# Patient Record
Sex: Female | Born: 1950
Health system: Southern US, Community
[De-identification: ages and names within clinical notes are randomized; demographics above are authoritative.]

## PROBLEM LIST (undated history)

## (undated) DIAGNOSIS — F32A Depression, unspecified: Secondary | ICD-10-CM

## (undated) DIAGNOSIS — I10 Essential (primary) hypertension: Secondary | ICD-10-CM

## (undated) DIAGNOSIS — T8859XA Other complications of anesthesia, initial encounter: Secondary | ICD-10-CM

## (undated) DIAGNOSIS — J45909 Unspecified asthma, uncomplicated: Secondary | ICD-10-CM

## (undated) DIAGNOSIS — F329 Major depressive disorder, single episode, unspecified: Secondary | ICD-10-CM

## (undated) DIAGNOSIS — B002 Herpesviral gingivostomatitis and pharyngotonsillitis: Secondary | ICD-10-CM

## (undated) DIAGNOSIS — K589 Irritable bowel syndrome without diarrhea: Secondary | ICD-10-CM

## (undated) DIAGNOSIS — T7840XA Allergy, unspecified, initial encounter: Secondary | ICD-10-CM

## (undated) DIAGNOSIS — K219 Gastro-esophageal reflux disease without esophagitis: Secondary | ICD-10-CM

## (undated) DIAGNOSIS — E119 Type 2 diabetes mellitus without complications: Secondary | ICD-10-CM

## (undated) DIAGNOSIS — F419 Anxiety disorder, unspecified: Secondary | ICD-10-CM

## (undated) DIAGNOSIS — E785 Hyperlipidemia, unspecified: Secondary | ICD-10-CM

## (undated) DIAGNOSIS — H269 Unspecified cataract: Secondary | ICD-10-CM

## (undated) DIAGNOSIS — M1990 Unspecified osteoarthritis, unspecified site: Secondary | ICD-10-CM

## (undated) DIAGNOSIS — H409 Unspecified glaucoma: Secondary | ICD-10-CM

## (undated) DIAGNOSIS — M47812 Spondylosis without myelopathy or radiculopathy, cervical region: Secondary | ICD-10-CM

## (undated) HISTORY — DX: Gastro-esophageal reflux disease without esophagitis: K21.9

## (undated) HISTORY — DX: Spondylosis without myelopathy or radiculopathy, cervical region: M47.812

## (undated) HISTORY — DX: Herpesviral gingivostomatitis and pharyngotonsillitis: B00.2

## (undated) HISTORY — PX: ROTATOR CUFF REPAIR: SHX139

## (undated) HISTORY — DX: Major depressive disorder, single episode, unspecified: F32.9

## (undated) HISTORY — DX: Unspecified cataract: H26.9

## (undated) HISTORY — DX: Allergy, unspecified, initial encounter: T78.40XA

## (undated) HISTORY — PX: DEBRIDEMENT TENNIS ELBOW: SHX1442

## (undated) HISTORY — DX: Depression, unspecified: F32.A

## (undated) HISTORY — PX: EYE SURGERY: SHX253

## (undated) HISTORY — PX: UPPER GASTROINTESTINAL ENDOSCOPY: SHX188

## (undated) HISTORY — DX: Anxiety disorder, unspecified: F41.9

## (undated) HISTORY — PX: TONSILLECTOMY: SUR1361

## (undated) HISTORY — DX: Hyperlipidemia, unspecified: E78.5

## (undated) HISTORY — DX: Unspecified glaucoma: H40.9

## (undated) HISTORY — DX: Irritable bowel syndrome, unspecified: K58.9

## (undated) HISTORY — DX: Essential (primary) hypertension: I10

## (undated) HISTORY — PX: BREAST EXCISIONAL BIOPSY: SUR124

## (undated) HISTORY — DX: Unspecified osteoarthritis, unspecified site: M19.90

## (undated) HISTORY — PX: BREAST SURGERY: SHX581

---

## 1976-05-11 HISTORY — PX: ABDOMINAL HYSTERECTOMY: SHX81

## 1986-05-11 HISTORY — PX: APPENDECTOMY: SHX54

## 1997-10-15 ENCOUNTER — Ambulatory Visit (HOSPITAL_COMMUNITY): Admission: RE | Admit: 1997-10-15 | Discharge: 1997-10-15 | Payer: Self-pay | Admitting: Surgery

## 1999-04-30 ENCOUNTER — Encounter: Admission: RE | Admit: 1999-04-30 | Discharge: 1999-04-30 | Payer: Self-pay | Admitting: Family Medicine

## 1999-04-30 ENCOUNTER — Encounter: Payer: Self-pay | Admitting: Family Medicine

## 2000-06-21 ENCOUNTER — Encounter: Payer: Self-pay | Admitting: Family Medicine

## 2000-06-21 ENCOUNTER — Encounter: Admission: RE | Admit: 2000-06-21 | Discharge: 2000-06-21 | Payer: Self-pay | Admitting: Family Medicine

## 2000-12-12 ENCOUNTER — Encounter: Payer: Self-pay | Admitting: Family Medicine

## 2000-12-12 ENCOUNTER — Ambulatory Visit (HOSPITAL_COMMUNITY): Admission: RE | Admit: 2000-12-12 | Discharge: 2000-12-12 | Payer: Self-pay | Admitting: Family Medicine

## 2001-05-20 ENCOUNTER — Ambulatory Visit (HOSPITAL_BASED_OUTPATIENT_CLINIC_OR_DEPARTMENT_OTHER): Admission: RE | Admit: 2001-05-20 | Discharge: 2001-05-20 | Payer: Self-pay | Admitting: Orthopedic Surgery

## 2001-09-30 ENCOUNTER — Encounter: Payer: Self-pay | Admitting: Family Medicine

## 2001-09-30 ENCOUNTER — Encounter: Admission: RE | Admit: 2001-09-30 | Discharge: 2001-09-30 | Payer: Self-pay | Admitting: Family Medicine

## 2002-05-19 ENCOUNTER — Encounter (INDEPENDENT_AMBULATORY_CARE_PROVIDER_SITE_OTHER): Payer: Self-pay | Admitting: Specialist

## 2002-05-19 ENCOUNTER — Ambulatory Visit (HOSPITAL_COMMUNITY): Admission: RE | Admit: 2002-05-19 | Discharge: 2002-05-19 | Payer: Self-pay | Admitting: Gastroenterology

## 2002-10-27 ENCOUNTER — Other Ambulatory Visit: Admission: RE | Admit: 2002-10-27 | Discharge: 2002-10-27 | Payer: Self-pay | Admitting: Family Medicine

## 2003-08-09 ENCOUNTER — Encounter: Admission: RE | Admit: 2003-08-09 | Discharge: 2003-08-09 | Payer: Self-pay | Admitting: Family Medicine

## 2004-02-21 ENCOUNTER — Encounter: Admission: RE | Admit: 2004-02-21 | Discharge: 2004-02-21 | Payer: Self-pay | Admitting: Family Medicine

## 2005-02-09 ENCOUNTER — Other Ambulatory Visit: Admission: RE | Admit: 2005-02-09 | Discharge: 2005-02-09 | Payer: Self-pay | Admitting: Family Medicine

## 2005-08-20 ENCOUNTER — Encounter: Admission: RE | Admit: 2005-08-20 | Discharge: 2005-08-20 | Payer: Self-pay | Admitting: Family Medicine

## 2006-01-05 ENCOUNTER — Encounter: Admission: RE | Admit: 2006-01-05 | Discharge: 2006-01-05 | Payer: Self-pay | Admitting: Surgery

## 2006-01-18 ENCOUNTER — Ambulatory Visit: Payer: Self-pay | Admitting: General Surgery

## 2006-12-23 ENCOUNTER — Encounter: Admission: RE | Admit: 2006-12-23 | Discharge: 2006-12-23 | Payer: Self-pay | Admitting: Family Medicine

## 2006-12-29 ENCOUNTER — Encounter: Admission: RE | Admit: 2006-12-29 | Discharge: 2006-12-29 | Payer: Self-pay | Admitting: Family Medicine

## 2007-02-01 ENCOUNTER — Encounter (INDEPENDENT_AMBULATORY_CARE_PROVIDER_SITE_OTHER): Payer: Self-pay | Admitting: Diagnostic Radiology

## 2007-02-01 ENCOUNTER — Encounter: Admission: RE | Admit: 2007-02-01 | Discharge: 2007-02-01 | Payer: Self-pay | Admitting: Surgery

## 2007-05-12 HISTORY — PX: BREAST BIOPSY: SHX20

## 2008-03-15 ENCOUNTER — Other Ambulatory Visit: Admission: RE | Admit: 2008-03-15 | Discharge: 2008-03-15 | Payer: Self-pay | Admitting: Family Medicine

## 2008-06-19 LAB — HM COLONOSCOPY

## 2008-10-12 ENCOUNTER — Emergency Department: Payer: Self-pay | Admitting: Emergency Medicine

## 2009-05-11 HISTORY — PX: COLONOSCOPY: SHX174

## 2010-02-06 ENCOUNTER — Ambulatory Visit: Payer: Self-pay | Admitting: Family Medicine

## 2010-02-06 DIAGNOSIS — B09 Unspecified viral infection characterized by skin and mucous membrane lesions: Secondary | ICD-10-CM | POA: Insufficient documentation

## 2010-06-10 NOTE — Assessment & Plan Note (Signed)
Summary: place on forehead-?shingles/jbb   Vital Signs:  Patient Profile:   60 Years Old Female CC:      bump on face ? shingles Weight:      117 pounds Temp:     97.3 degrees F oral Pulse rate:   57 / minute Pulse rhythm:   regular Resp:     14 per minute BP sitting:   145 / 78  (left arm)  Pt. in pain?   no  Vitals Entered By: Tacey Ruiz MD (February 07, 2010 12:12 PM)                 History of Present Illness History from: patient Reason for visit: see chief complaint Chief Complaint: bump on face ? shingles History of Present Illness: 3 days ago noted erythematous rash on forehead. It bothered her only a small amount. She did mess with it. another came up later that day on her forehead that looked similar to the first, no pain or itching again. This morning however, she noted 3 small pimple-like lesions on her upper left forehead that was painful and then pruitic. She saw a pharmacist who referred her to see [place]. She also reports that she has numbness on the upper left forehead and left upper cheek. No history of shingles in the past. Does have a history of fever blister on the lip.  REVIEW OF SYSTEMS Constitutional Symptoms       Complains of fatigue.     Denies fever, chills, night sweats, weight loss, and weight gain.  Eyes       Complains of eye drainage and glasses.      Denies change in vision, eye pain, contact lenses, and eye surgery. Ear/Nose/Throat/Mouth       Complains of dizziness and sinus problems.      Denies hearing loss/aids, change in hearing, ear pain, ear discharge, frequent runny nose, frequent nose bleeds, sore throat, hoarseness, and tooth pain or bleeding.  Respiratory       Denies dry cough, productive cough, wheezing, shortness of breath, asthma, bronchitis, and emphysema/COPD.  Cardiovascular       Denies murmurs, chest pain, and tires easily with exhertion.    Gastrointestinal       Complains of indigestion.      Denies stomach pain,  nausea/vomiting, diarrhea, constipation, and blood in bowel movements. Genitourniary       Denies painful urination, kidney stones, and loss of urinary control. Neurological       Complains of headaches, numbness, and tingling.      Denies paralysis, seizures, and fainting/blackouts. Musculoskeletal       Complains of muscle pain, joint pain, joint stiffness, decreased range of motion, and muscle weakness.      Denies redness, swelling, and gout.  Skin       Denies bruising, unusual mles/lumps or sores, and hair/skin or nail changes.  Psych       Complains of anxiety/stress, depression, and sleep problems.      Denies mood changes, temper/anger issues, and speech problems.  Past History:  Past Medical History: Hyperlipidemia  Past Surgical History: Hysterectomy (partial by report) 1978 Rotator cuff repair - right and left Tonsillectomy - 1980 Breast biopsies ([id number])  Family History: Father: 69D : COPD Mother: 61D : lung cancer, htn, diabetes, hyperlipidemia Siblings:   Social History: Divorced Never Smoked Drug use-no Smoking Status:  never Drug Use:  no Physical Exam General appearance: well developed, well nourished, no acute distress Chest/Lungs:  no rales, wheezes, or rhonchi bilateral, breath sounds equal without effort Heart: regular rate and  rhythm, no murmur Skin: 2 erythematous macular lesions on upper left forehead - one crossing midline. 1 Pustular lesion, with surrounding erythema (white head) MSE: oriented to time, place, and person, somewhat anxious Assessment New Problems: UNSPECIFIED VIRAL EXANTHEM (ICD-057.9) HYPERLIPIDEMIA (ICD-272.4)   Plan New Medications/Changes: VALTREX 1 GM TABS (VALACYCLOVIR HCL) 1 by mouth three times a day x 7 days  #21 x 0, 02/06/2010, Tacey Ruiz MD  New Orders: New Patient Level III [phone]  The patient and/or caregiver has been counseled thoroughly with regard to medications prescribed including dosage,  schedule, interactions, rationale for use, and possible side effects and they verbalize understanding.  Diagnoses and expected course of recovery discussed and will return if not improved as expected or if the condition worsens. Patient and/or caregiver verbalized understanding.  Prescriptions: VALTREX 1 GM TABS (VALACYCLOVIR HCL) 1 by mouth three times a day x 7 days  #21 x 0   Entered and Authorized by:   Tacey Ruiz MD   Signed by:   Tacey Ruiz MD on 02/06/2010   Method used:   Electronically to        [facility]  [address] (retail)       [address], [address]       [place], [place]  [address]       Ph: [phone]       Fax: [phone]   RxID:   [id number]   Orders Added: 1)  New Patient Level III [25366]  Treating it as zoster as she does have numbness, but doubtful. She was given precautions in case and told to watch for any changes or worrisome developments. Urged not to touch lesions/face.   The patient was informed that there is no on-call provider or services available at this clinic during off-hours (when the clinic is closed).  If the patient developed a problem or concern that required immediate attention, the patient was advised to go the the nearest available urgent care or emergency department for medical care.  The patient verbalized understanding.     The risks, benefits and possible side effects of the treatments and tests were explained clearly to the patient and the patient verbalized understanding.

## 2010-09-26 NOTE — Op Note (Signed)
[facility]  Patient:    Maria Sweeney, Maria Sweeney Visit Number: [id number] MRN: [id number]          Service Type: DSU Location: [facility] Attending Physician:  Colbert Ewing Dictated by:   Loreta Ave, M.D. Proc. Date: 05/20/01 Admit Date:  05/20/2001 Discharge Date: 05/20/2001                             Operative Report  PREOPERATIVE DIAGNOSIS:   Impingement left shoulder with degenerative joint disease acromioclavicular joint.  POSTOPERATIVE DIAGNOSIS:  Impingement left shoulder with degenerative joint disease acromioclavicular joint.  OPERATION:  Left shoulder exam under anesthesia, arthroscopy, with debridement of superior rotator cuff, bursectomy, acromioplasty.  Excision of distal clavicle.  SURGEON:  Loreta Ave, M.D.  ASSISTANT:  Arlys John D. Petrarca, P.A.-C.  ANESTHESIA:  General.  ESTIMATED BLOOD LOSS:  Minimal.  SPECIMENS:  None.  CULTURES:  None.  COMPLICATIONS:  None.  DRESSINGS:  Soft compressive with sling.  DESCRIPTION OF PROCEDURE:  The patient was brought to the operating room and placed on the operating table in the supine position.  After adequate anesthesia had been obtained, the left shoulder was examined.  Full motion, good stability.  Placed in beach chair position on the shoulder positioner, prepped and draped in the usual sterile fashion.  Three standard arthroscopic portals, anterior, posterior and lateral.  Shoulder ended with a blunt obturator, distended and inspected.  The interior of the glenohumeral joint looked quite good with normal articular cartilage, labrum, ligaments, undersurface of rotator cuff, biceps tendon and biceps anchor.  Cannula redirected subacromially.  Typical impingement with abrasive change on the top of the cuff but no structural tearing.  Thick and inflamed bursae.  Cuff debrided.  Bursae resected.  Type 2 acromion.  Converted to a type 1 acromion with shaver and a high speed  bur releasing the CA ligament with hemostasis with cautery.  Distal clavicle exposed.  Marked periarticular spurring.  Grade 3 and 4 changes.  The lateral 1 cm of the clavicle sharply resected with shaver and high speed bur.  Decompression and clavicle excision viewed from all portals and found to be very adequate.  Instruments and fluid removed. Portals in shoulder and bursae injected with Marcaine.  Portals closed with 4-0 nylon.  Sterile compressive dressing applied.  Anesthesia reversed. Brought to the recovery room.  He tolerated the procedure well with no complications. Dictated by:   Loreta Ave, M.D. Attending Physician:  Colbert Ewing DD:  05/21/01 TD:  05/23/01 Job: [id number]

## 2010-09-26 NOTE — Op Note (Signed)
   Maria Sweeney, Maria Sweeney                          ACCOUNT NO.:  [id number]   MEDICAL RECORD NO.:  [id number]                   PATIENT TYPE:  AMB   LOCATION:  ENDO                                 FACILITY[facility]  [facility]   PHYSICIAN:  James L. Malon Kindle., M.D.          DATE OF BIRTH:  26-Sep-1950   DATE OF PROCEDURE:  05/19/2002  DATE OF DISCHARGE:                                 OPERATIVE REPORT   PROCEDURE:  Colonoscopy and coagulation of polyps.   MEDICATIONS:  Fentanyl 125 mcg, Versed 10 mg IV.   ENDOSCOPE:  Olympus pediatric colonoscope.   INDICATIONS:  Screening in a woman who is at somewhat increased risk for  colon cancer.   DESCRIPTION OF PROCEDURE:  The procedure had been explained to the patient  and consent obtained.  With the patient in the left lateral decubitus  position, the Olympus pediatric video colonoscope inserted and advanced  under direct visualization.  Prep was excellent.  She had a long, tortuous  colon and abdominal pressure and position changes were needed, but we were  able to reach the cecum without difficulty.  The ileocecal valve and  appendiceal orifice were seen.  The scope was withdrawn and the cecum and  ascending colon were seen well.  In the mid-ascending colon were two polyps  about 5 cm apart, each 3-4 mm in diameter, and both were cauterized with the  hot biopsy forceps and placed in a single jar.  The transverse and  descending colon were free of polyps.  In the sigmoid near the junction of  the descending colon approximately 30-35 cm from the anal verge, another 2-3  mm polyp was seen and was cauterized and placed in a separate jar.  No other  polyps were seen.  The scope was withdrawn.  The rectum was free of polyps.  The patient tolerated the procedure well.   ASSESSMENT:  Multiple colon polyps, cauterized.   PLAN:  Will check pathology, make further recommendations then.  Routine  postpolypectomy instructions.                       James L. Malon Kindle., M.D.    Waldron Session  D:  05/19/2002  T:  05/19/2002  Job:  [id number]   cc:   Chales Salmon. Abigail Miyamoto, M.D.  [address]  [place]  [place] [address]  Fax: [phone]

## 2011-06-08 ENCOUNTER — Other Ambulatory Visit: Payer: Self-pay | Admitting: Family Medicine

## 2011-06-08 ENCOUNTER — Ambulatory Visit (INDEPENDENT_AMBULATORY_CARE_PROVIDER_SITE_OTHER): Payer: No Typology Code available for payment source

## 2011-06-08 DIAGNOSIS — S81009A Unspecified open wound, unspecified knee, initial encounter: Secondary | ICD-10-CM

## 2011-06-08 DIAGNOSIS — B351 Tinea unguium: Secondary | ICD-10-CM

## 2011-06-08 DIAGNOSIS — R32 Unspecified urinary incontinence: Secondary | ICD-10-CM

## 2011-06-08 DIAGNOSIS — S91009A Unspecified open wound, unspecified ankle, initial encounter: Secondary | ICD-10-CM

## 2011-06-09 ENCOUNTER — Other Ambulatory Visit: Payer: Self-pay | Admitting: Family Medicine

## 2011-06-09 DIAGNOSIS — M5430 Sciatica, unspecified side: Secondary | ICD-10-CM

## 2011-06-09 LAB — COMPLETE METABOLIC PANEL WITH GFR
ALT: 15 U/L (ref 0–35)
AST: 16 U/L (ref 0–37)
Albumin: 4.7 g/dL (ref 3.5–5.2)
Alkaline Phosphatase: 80 U/L (ref 39–117)
BUN: 13 mg/dL (ref 6–23)
CO2: 29 mEq/L (ref 19–32)
Calcium: 9.9 mg/dL (ref 8.4–10.5)
Chloride: 105 mEq/L (ref 96–112)
Creat: 0.76 mg/dL (ref 0.50–1.10)
GFR, Est African American: >89 mL/min
GFR, Est Non African American: 85 mL/min
Glucose, Bld: 79 mg/dL (ref 70–99)
Potassium: 4.3 mEq/L (ref 3.5–5.3)
Sodium: 143 mEq/L (ref 135–145)
Total Bilirubin: 0.6 mg/dL (ref 0.3–1.2)
Total Protein: 6.8 g/dL (ref 6.0–8.3)

## 2011-06-09 LAB — LIPID PANEL
Cholesterol: 220 mg/dL — ABNORMAL HIGH (ref 0–200)
HDL: 55 mg/dL (ref >39)
LDL Cholesterol: 123 mg/dL — ABNORMAL HIGH (ref 0–99)
Total CHOL/HDL Ratio: 4 Ratio
Triglycerides: 210 mg/dL — ABNORMAL HIGH (ref <150)
VLDL: 42 mg/dL — ABNORMAL HIGH (ref 0–40)

## 2011-06-11 ENCOUNTER — Telehealth: Payer: Self-pay

## 2011-06-11 NOTE — Telephone Encounter (Signed)
Pt returned call. Explained Bx results to pt. Pt wanted to ask Dr. Milus Glazier if she needs to RTC to have the rest of the lesion removed. She reports there are no sutures from Bx, but please advise if the rest should be removed.

## 2011-06-20 ENCOUNTER — Other Ambulatory Visit: Payer: Self-pay

## 2011-12-11 ENCOUNTER — Other Ambulatory Visit: Payer: Self-pay | Admitting: Family Medicine

## 2011-12-24 ENCOUNTER — Ambulatory Visit (INDEPENDENT_AMBULATORY_CARE_PROVIDER_SITE_OTHER): Payer: No Typology Code available for payment source | Admitting: Family Medicine

## 2011-12-24 VITALS — BP 140/70 | HR 81 | Temp 98.0°F | Resp 16 | Ht 60.5 in | Wt 118.8 lb

## 2011-12-24 DIAGNOSIS — F341 Dysthymic disorder: Secondary | ICD-10-CM

## 2011-12-24 DIAGNOSIS — F418 Other specified anxiety disorders: Secondary | ICD-10-CM

## 2011-12-24 MED ORDER — CITALOPRAM HYDROBROMIDE 20 MG PO TABS: 20.0000 mg | ORAL_TABLET | Freq: Every day | ORAL | Status: DC

## 2011-12-24 MED ORDER — CLONAZEPAM 0.5 MG PO TABS: 0.5000 mg | ORAL_TABLET | Freq: Two times a day (BID) | ORAL | Status: DC | PRN

## 2011-12-24 NOTE — Progress Notes (Signed)
  Subjective:    Patient ID: Maria Sweeney, female    DOB: 1951/01/09, 61 y.o.   MRN: [id number]  HPI Maria Sweeney is a 61 y.o. female Hx of anxiety.  Takes celexa 20mg  qd - prior on Effexor, but on celexa for years. No new side effects. Stress with finances - 2 part time jobs. Hx of delayed grief reaction from mom's death.   Feels depressed at times - from not being able to work, challenges with multiple.  walks at work only.   Klonopin 0.5mg  BID prn - usually takes this  #60 with 5 refills of clonazepam rx 06/08/11. Takes one per day usually, but often at nights to calm down. Notes more anxiety. Decreased motivation - anhedonia currently - motivation and cost.  No SI.   tried to wean off celexa few months ago -  Felt ok for two weeks, then had to restart due to depression sx's.  No alcohol.   Review of Systems  Constitutional: Positive for fatigue. Negative for activity change.  Psychiatric/Behavioral: Positive for disturbed wake/sleep cycle. Negative for suicidal ideas.       Anhedonia.       Objective:   Physical Exam  Constitutional: She appears well-developed and well-nourished. No distress.  HENT:  Head: Normocephalic and atraumatic.  Cardiovascular: Normal rate, regular rhythm, normal heart sounds and intact distal pulses.   Pulmonary/Chest: Effort normal and breath sounds normal.  Skin: Skin is warm and dry.  Psychiatric: She has a normal mood and affect. Her speech is normal and behavior is normal. Judgment normal. Cognition and memory are normal. She expresses no suicidal ideation.       Assessment & Plan:  Maria Sweeney is a 61 y.o. female 1. Depression with anxiety  clonazePAM (KLONOPIN) 0.5 MG tablet, citalopram (CELEXA) 20 MG tablet   Anhedonia, decreased motivation with frustrations in finding a job and finances.  Discussed exercise role.  Discussed increase of Celexa, but risk of QT prolongation as taking omeprazole.- kept on same dose. Cont klonpin prn - and  goal of decreasing frequent use of this as able.  Recheck in next 6 months, sooner prn.  Greater than 75% of office visit with counseling - .   Patient Instructions  Continue at same dose of celexa at this point as possible risk of cardiac arrythmia at higher doses when taking omeprazole.  Work on Medical illustrator and exercise to help with motivation symptoms.  Can continue same doses of klonopin as needed.  Recheck in the next 6 months. Return to the clinic or go to the nearest emergency room if any of your symptoms worsen or new symptoms occur.

## 2011-12-24 NOTE — Patient Instructions (Signed)
Continue at same dose of celexa at this point as possible risk of cardiac arrythmia at higher doses when taking omeprazole.  Work on Medical illustrator and exercise to help with motivation symptoms.  Can continue same doses of klonopin as needed.  Recheck in the next 6 months. Return to the clinic or go to the nearest emergency room if any of your symptoms worsen or new symptoms occur.

## 2011-12-28 ENCOUNTER — Telehealth: Payer: Self-pay

## 2011-12-28 NOTE — Telephone Encounter (Signed)
Pt states she was rx'd clonazapam 0.5mg  qty60. States her rx is usually for 1mg  tablets qty 60. States when she saw dr for refill she couldn't remember dosage so she was rx'd the 0.5mg .  Please call pt.  Best: [phone]  bf

## 2011-12-28 NOTE — Telephone Encounter (Signed)
See OV, doesn't look like medicine has been filled yet at pharmacy, can we change to 1mg ?

## 2011-12-29 NOTE — Telephone Encounter (Signed)
I have left message for her to call me back so I can advise.

## 2011-12-29 NOTE — Telephone Encounter (Signed)
Sure we can change it to one 1 mg, but this will be without refills and she will have to bring the old Rx back to exchange. Her other option is to just keep the 0.5 mg tabs take two of them as directed and continue on. It's up to her.

## 2012-01-02 NOTE — Telephone Encounter (Signed)
She didn't realize the RX was not 1 mg until she received it. The pills looked different to her and she saw the mg was 0.5. She states she takes 1 mg bid as needed and usually gets #60. Can we sent in her another RX, she will keep her RX but will run out faster. Please advise

## 2012-01-03 MED ORDER — CLONAZEPAM 1 MG PO TABS: 1.0000 mg | ORAL_TABLET | Freq: Two times a day (BID) | ORAL | Status: DC | PRN

## 2012-01-03 NOTE — Telephone Encounter (Signed)
rx printed

## 2012-01-03 NOTE — Telephone Encounter (Signed)
Called pt to let her know RX will be sent in. Mailbox is full, unable to leave message

## 2012-01-04 NOTE — Telephone Encounter (Signed)
Have tried multiple times for her unable to contact., if she has further needs she will hopefully advise.

## 2012-01-16 ENCOUNTER — Other Ambulatory Visit: Payer: Self-pay | Admitting: Family Medicine

## 2012-01-23 ENCOUNTER — Other Ambulatory Visit: Payer: Self-pay

## 2012-01-23 DIAGNOSIS — F418 Other specified anxiety disorders: Secondary | ICD-10-CM

## 2012-03-14 ENCOUNTER — Other Ambulatory Visit: Payer: Self-pay | Admitting: Family Medicine

## 2012-04-13 ENCOUNTER — Telehealth: Payer: Self-pay

## 2012-04-13 NOTE — Telephone Encounter (Signed)
Pt is requesting 1.0 clonophan [phone]

## 2012-04-14 ENCOUNTER — Other Ambulatory Visit: Payer: Self-pay | Admitting: Family Medicine

## 2012-04-14 ENCOUNTER — Telehealth: Payer: Self-pay

## 2012-04-14 DIAGNOSIS — F419 Anxiety disorder, unspecified: Secondary | ICD-10-CM

## 2012-04-14 MED ORDER — CLONAZEPAM 1 MG PO TABS: 1.0000 mg | ORAL_TABLET | Freq: Two times a day (BID) | ORAL | Status: DC | PRN

## 2012-04-14 NOTE — Telephone Encounter (Signed)
lmom that rx was sent into pharmacy 

## 2012-04-14 NOTE — Telephone Encounter (Signed)
See the prior phone notes in August.  She was actually taking to 1.0 mg at that office visit and mistakingly thought they were 0.5mg .  Ok to refill Klonopin 1.0mg  BID prn, # 60, with 1 refill. Office visit needed in February 2014.

## 2012-04-14 NOTE — Telephone Encounter (Signed)
Your note from office visit indicates patient is to take klonopin at current dose, 0.5 patient requests this be increased to 1mg  tablet, please advise.

## 2012-04-14 NOTE — Telephone Encounter (Signed)
Patient called again requesting her 1.0 mg clonazePAM (KLONOPIN) 1 MG tablet Wants to void the .5 mg script and wants the 1.0 mg 60 tabs per month. Takes 1 to 2 tabs per day. She is completely out of medication  [phone] (H)

## 2012-05-16 ENCOUNTER — Other Ambulatory Visit: Payer: Self-pay | Admitting: Family Medicine

## 2012-05-16 NOTE — Telephone Encounter (Signed)
Due for OV Feb 2014

## 2012-06-16 ENCOUNTER — Telehealth: Payer: Self-pay | Admitting: *Deleted

## 2012-06-16 MED ORDER — OMEPRAZOLE 40 MG PO CPDR: 40.0000 mg | DELAYED_RELEASE_CAPSULE | Freq: Two times a day (BID) | ORAL | Status: DC

## 2012-06-16 NOTE — Telephone Encounter (Signed)
[facility] requesting refill on omeprazole 40mg .  Last fill 05/16/12

## 2012-06-16 NOTE — Telephone Encounter (Signed)
Done

## 2012-07-21 ENCOUNTER — Telehealth: Payer: Self-pay

## 2012-07-21 NOTE — Telephone Encounter (Signed)
Left message what meds does she need.

## 2012-07-21 NOTE — Telephone Encounter (Signed)
Pt would like to know if we could refill her medications for one week until she can get in next week to be seen.  [phone]

## 2012-07-22 ENCOUNTER — Telehealth: Payer: Self-pay

## 2012-07-22 DIAGNOSIS — F418 Other specified anxiety disorders: Secondary | ICD-10-CM

## 2012-07-22 MED ORDER — OMEPRAZOLE 40 MG PO CPDR: 40.0000 mg | DELAYED_RELEASE_CAPSULE | Freq: Every day | ORAL | Status: DC

## 2012-07-22 MED ORDER — CLONAZEPAM 1 MG PO TABS: 1.0000 mg | ORAL_TABLET | Freq: Two times a day (BID) | ORAL | Status: DC | PRN

## 2012-07-22 MED ORDER — CITALOPRAM HYDROBROMIDE 20 MG PO TABS: 20.0000 mg | ORAL_TABLET | Freq: Every day | ORAL | Status: DC

## 2012-07-22 NOTE — Telephone Encounter (Signed)
Patient due for follow up ? Okay to give 1 mo supply. Pended.

## 2012-07-22 NOTE — Telephone Encounter (Signed)
v

## 2012-07-22 NOTE — Telephone Encounter (Signed)
Called in Clonazepam. Patient advised she is due for follow up

## 2012-07-22 NOTE — Telephone Encounter (Signed)
Pt staets she spoke with amy recently about rx's she needs refilled and was told to call back with info about what she needed. Pt states she needs refills on her clonazapam 1mg , citalopram 20mg  and ameprozol  Best: [phone]  Pt states she will be in Monday or Tuesday for ov.  bf

## 2012-07-22 NOTE — Telephone Encounter (Signed)
I will give her 1 month supply of her Celexa and omeprazole and some Klonopin but she should keep her planned F/u with Dr Neva Seat.  Please let her know about our controlled substance policy due to her Klonopin.    Klonopin at [facility].

## 2012-07-25 ENCOUNTER — Encounter: Payer: Self-pay | Admitting: Family Medicine

## 2012-07-25 ENCOUNTER — Ambulatory Visit (INDEPENDENT_AMBULATORY_CARE_PROVIDER_SITE_OTHER): Payer: No Typology Code available for payment source | Admitting: Family Medicine

## 2012-07-25 VITALS — BP 158/90 | HR 90 | Temp 98.2°F | Resp 18 | Ht 60.05 in | Wt 126.8 lb

## 2012-07-25 DIAGNOSIS — F418 Other specified anxiety disorders: Secondary | ICD-10-CM

## 2012-07-25 DIAGNOSIS — F329 Major depressive disorder, single episode, unspecified: Secondary | ICD-10-CM

## 2012-07-25 DIAGNOSIS — F419 Anxiety disorder, unspecified: Secondary | ICD-10-CM | POA: Insufficient documentation

## 2012-07-25 DIAGNOSIS — K219 Gastro-esophageal reflux disease without esophagitis: Secondary | ICD-10-CM

## 2012-07-25 MED ORDER — CLONAZEPAM 1 MG PO TABS: 1.0000 mg | ORAL_TABLET | Freq: Two times a day (BID) | ORAL | Status: DC | PRN

## 2012-07-25 MED ORDER — CITALOPRAM HYDROBROMIDE 20 MG PO TABS: 20.0000 mg | ORAL_TABLET | Freq: Every day | ORAL | Status: DC

## 2012-07-25 MED ORDER — PRAVASTATIN SODIUM 40 MG PO TABS: 40.0000 mg | ORAL_TABLET | Freq: Every day | ORAL | Status: DC

## 2012-07-25 MED ORDER — OMEPRAZOLE 40 MG PO CPDR: 40.0000 mg | DELAYED_RELEASE_CAPSULE | Freq: Every day | ORAL | Status: DC

## 2012-07-25 NOTE — Assessment & Plan Note (Signed)
Controlled; refill provided.

## 2012-07-25 NOTE — Progress Notes (Signed)
[address]   [place], [place]  [address]   [phone]  Subjective:    Patient ID: Maria Sweeney, female    DOB: 1950/07/15, 62 y.o.   MRN: [id number]  HPI This 62 y.o. female presents for evaluation of the following:  1.  Depression with anxiety:  Six month follow-up.Last visit by Dr.  Neva Seat.  No changes to management made at last visit.    Citalopram 20mg  daily.  Also Clonazepam 1mg  bid; taking as needed.  Usually taking one.  Mother died in 45.  Has tried multiple antideppresant.  Son and wife separated for another man; has grandson who is 50 yo.  Ugly divorce.  Son filing for child custody. Son got pirmary custody.Son working third shift; son working second.  Son and grandson living with them.  Boyfriend moving out this week.  Son is alcoholic.  Works two Systems developer jobs.  Mother died with cancer.  Cared for mother and father who had COPD.  Has undergone counseling in past.  Terminated in 2009; cannot afford insurance.  Works at [employer] and [employer].  Just received Social Security.  Dating same guy for four years.  No control anymore.    2.  Hyperlipidemia: one year follow-up.  No Pravastatin in two months; reports good tolerance to medication and good symptom control.  Denies CP/palp/SOB/diaphoresis.  Denies HA/dizziness/visual changes/focal weakness/dizziness.    3. GERD:  Controlled with Omeprazole 40mg  daily; denies n/v/d/c.  Colonoscopy UTD.     PCP:  [place]  Review of Systems  Constitutional: Positive for unexpected weight change. Negative for fever, chills, diaphoresis, activity change, appetite change and fatigue.  Respiratory: Negative for shortness of breath.   Cardiovascular: Negative for chest pain, palpitations and leg swelling.  Gastrointestinal: Negative for nausea, vomiting, abdominal pain, diarrhea and constipation.  Neurological: Negative for dizziness, syncope, facial asymmetry, speech difficulty, weakness, light-headedness, numbness and headaches.    Psychiatric/Behavioral: Positive for dysphoric mood. Negative for suicidal ideas, sleep disturbance and self-injury. The patient is nervous/anxious.         Past Medical History  Diagnosis Date  . Depression   . GERD (gastroesophageal reflux disease)   . Anxiety   . Hyperlipidemia   . Oral herpes   . Hypertension   . IBS (irritable bowel syndrome)     Constipation with diarrhea.    Past Surgical History  Procedure Laterality Date  . Breast biopsy Bilateral 2009    Calcium Deposit- 1980's and also in 2009/ Right side 1995  . Rotator cuff repair    . Debridement tennis elbow    . Appendectomy  1988  . Abdominal hysterectomy  1978    DUB; ovaries intact.  . Colonoscopy  05/11/2009    Normal.  Previous colonoscopy with polyps. [facility].      Prior to Admission medications   Medication Sig Start Date End Date Taking? Authorizing Provider  citalopram (CELEXA) 20 MG tablet Take 1 tablet (20 mg total) by mouth daily. 07/25/12  Yes Ethelda Chick, MD  clonazePAM (KLONOPIN) 1 MG tablet Take 1 tablet (1 mg total) by mouth 2 (two) times daily as needed for anxiety. 07/25/12 08/24/12 Yes Ethelda Chick, MD  omeprazole (PRILOSEC) 40 MG capsule Take 1 capsule (40 mg total) by mouth daily. 07/25/12  Yes Ethelda Chick, MD  pravastatin (PRAVACHOL) 40 MG tablet Take 1 tablet (40 mg total) by mouth at bedtime. 07/25/12  Yes Ethelda Chick, MD  valACYclovir (VALTREX) 1000 MG tablet Take 1,000  mg by mouth 2 (two) times daily.   Yes Historical Provider, MD    Allergies  Allergen Reactions  . Amoxicillin   . Augmentin (Amoxicillin-Pot Clavulanate)   . Codeine   . Depakote (Divalproex Sodium)   . Latex   . Lipitor (Atorvastatin)   . Penicillins   . Septra (Sulfamethoxazole W-Trimethoprim)   . Zocor (Simvastatin)     History   Social History  . Marital Status: Divorced    Spouse Name: N/A    Number of Children: 2  . Years of Education: N/A   Occupational History  . 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   Social History Main Topics  . Smoking status: Never Smoker   . Smokeless tobacco: Never Used  . Alcohol Use: 0.6 oz/week    1 Glasses of wine per week     Comment: rare alcohol use; once per year on average.  . Drug Use: No  . Sexually Active: Yes -- Female partner(s)    Birth Control/ Protection: Surgical   Other Topics Concern  . Not on file   Social History Narrative   Marital status: divorced since 1999; dating.      Children: one son, one daughter; 2 grandchildren.      Lives with son, grandson     Employment:  Two part-time jobs at [employer] and [employer]      Tobacco: none      Alcohol: rare      Drugs: none      Exercise:  sporadic    Family History  Problem Relation Age of Onset  . Hyperlipidemia Mother   . Hypertension Mother   . Cancer Mother     lung  . Diabetes Mother   . Glaucoma Mother   . Stroke Mother 25  . Heart disease Mother   . Asthma Father   . Emphysema Father   . COPD Father   . Glaucoma Father   . Hypertension Sister     Objective:   Physical Exam  Nursing note and vitals reviewed. Constitutional: She is oriented to person, place, and time. She appears well-developed and well-nourished. No distress.  Eyes: Conjunctivae are normal. Pupils are equal, round, and reactive to light.  Neck: Normal range of motion. Neck supple. No thyromegaly present.  Cardiovascular: Normal rate, regular rhythm, normal heart sounds and intact distal pulses.  Exam reveals no gallop and no friction rub.   No murmur heard. Pulmonary/Chest: Effort normal and breath sounds normal. She has no wheezes. She has no rales.  Abdominal: Soft. Bowel sounds are normal. She exhibits no distension and no mass. There is no tenderness. There is no rebound and no guarding.  Lymphadenopathy:    She has no cervical adenopathy.  Neurological: She is alert and oriented to person, place, and time. No cranial nerve deficit. She exhibits normal muscle tone.  Coordination normal.  Skin: She is not diaphoretic.  Psychiatric: She has a normal mood and affect. Her behavior is normal. Judgment and thought content normal.        Assessment & Plan:  HYPERLIPIDEMIA - Plan: pravastatin (PRAVACHOL) 40 MG tablet  Anxiety and depression - Plan: clonazePAM (KLONOPIN) 1 MG tablet, citalopram (CELEXA) 20 MG tablet  GERD (gastroesophageal reflux disease) - Plan: omeprazole (PRILOSEC) 40 MG capsule  Anxiety - Plan: clonazePAM (KLONOPIN) 1 MG tablet  Depression with anxiety - Plan: citalopram (CELEXA) 20 MG tablet   Meds ordered this encounter  Medications  .  clonazePAM (KLONOPIN) 1 MG tablet    Sig: Take 1 tablet (1 mg total) by mouth 2 (two) times daily as needed for anxiety.    Dispense:  60 tablet    Refill:  5    Patient due for follow up  . citalopram (CELEXA) 20 MG tablet    Sig: Take 1 tablet (20 mg total) by mouth daily.    Dispense:  30 tablet    Refill:  5  . pravastatin (PRAVACHOL) 40 MG tablet    Sig: Take 1 tablet (40 mg total) by mouth at bedtime.    Dispense:  30 tablet    Refill:  5  . omeprazole (PRILOSEC) 40 MG capsule    Sig: Take 1 capsule (40 mg total) by mouth daily.    Dispense:  30 capsule    Refill:  0    Will need an ov before more refills.    Order Specific Question:  Supervising Provider    Answer:  Ellamae Sia P [[id number]]

## 2012-07-25 NOTE — Assessment & Plan Note (Signed)
Uncontrolled due to non-compliance with medication and medical follow-up; refill provided; will obtain labs at follow-up visit in six months.

## 2012-07-25 NOTE — Patient Instructions (Addendum)
HYPERLIPIDEMIA - Plan: pravastatin (PRAVACHOL) 40 MG tablet  Anxiety and depression - Plan: clonazePAM (KLONOPIN) 1 MG tablet, citalopram (CELEXA) 20 MG tablet  GERD (gastroesophageal reflux disease) - Plan: omeprazole (PRILOSEC) 40 MG capsule  Anxiety - Plan: clonazePAM (KLONOPIN) 1 MG tablet  Depression with anxiety - Plan: citalopram (CELEXA) 20 MG tablet

## 2012-07-25 NOTE — Assessment & Plan Note (Signed)
Controlled despite multiple stressors. Counseled face to face fir 30 minutes with 50% of time dedicated to counseling; refill of Citalopram and Klonopin provided. Discussed new office policy requiring pt on controlled substance to establish with one PCP and follow-up with same provider regularly; pt expressed understanding.

## 2012-09-20 ENCOUNTER — Telehealth: Payer: Self-pay

## 2012-09-20 DIAGNOSIS — K219 Gastro-esophageal reflux disease without esophagitis: Secondary | ICD-10-CM

## 2012-09-20 NOTE — Telephone Encounter (Signed)
What medication is she requesting? She is asking about the omeprazole, she has not tried it once daily. She is advised to try this once daily, 30 minutes before breakfast and see if helpful. Advised it should work well for her all day. She states she has taken bid for years but will try this once daily now. I advised her if this is not helpful to call and we can resend it for bid dosing. To you FYI

## 2012-09-20 NOTE — Telephone Encounter (Signed)
PT STATES SHE WAS PUT ON SOME MEDICINE FOR TWICE A DAY, BUT WHEN SHE WENT TO GET HER MEDS IT WAS ONLY FOR 30 DAYS INSTEAD OF SIXTY PLEASE CALL [phone]  HAD FORGOTTEN THE NAME OF IT, BUT WILL HAVE WHEN WE CALL HER BACK      PHARMACY IN [place] AT [phone]

## 2013-01-16 ENCOUNTER — Ambulatory Visit: Payer: Self-pay | Admitting: Family Medicine

## 2013-01-16 ENCOUNTER — Encounter: Payer: Self-pay | Admitting: Family Medicine

## 2013-01-16 ENCOUNTER — Ambulatory Visit: Payer: Self-pay

## 2013-01-16 VITALS — BP 152/86 | HR 73 | Temp 98.3°F | Resp 16 | Ht 60.0 in | Wt 127.6 lb

## 2013-01-16 DIAGNOSIS — F418 Other specified anxiety disorders: Secondary | ICD-10-CM

## 2013-01-16 DIAGNOSIS — M25531 Pain in right wrist: Secondary | ICD-10-CM

## 2013-01-16 DIAGNOSIS — R635 Abnormal weight gain: Secondary | ICD-10-CM

## 2013-01-16 DIAGNOSIS — E785 Hyperlipidemia, unspecified: Secondary | ICD-10-CM

## 2013-01-16 DIAGNOSIS — F329 Major depressive disorder, single episode, unspecified: Secondary | ICD-10-CM

## 2013-01-16 DIAGNOSIS — N3941 Urge incontinence: Secondary | ICD-10-CM

## 2013-01-16 DIAGNOSIS — E7800 Pure hypercholesterolemia, unspecified: Secondary | ICD-10-CM

## 2013-01-16 DIAGNOSIS — R079 Chest pain, unspecified: Secondary | ICD-10-CM

## 2013-01-16 DIAGNOSIS — F419 Anxiety disorder, unspecified: Secondary | ICD-10-CM

## 2013-01-16 DIAGNOSIS — M25539 Pain in unspecified wrist: Secondary | ICD-10-CM

## 2013-01-16 DIAGNOSIS — F341 Dysthymic disorder: Secondary | ICD-10-CM

## 2013-01-16 LAB — CBC WITH DIFFERENTIAL/PLATELET
Basophils Absolute: 0 10*3/uL (ref 0.0–0.1)
Basophils Relative: 1 % (ref 0–1)
HCT: 37.7 % (ref 36.0–46.0)
Hemoglobin: 13.2 g/dL (ref 12.0–15.0)
Lymphocytes Relative: 19 % (ref 12–46)
MCHC: 35 g/dL (ref 30.0–36.0)
Monocytes Relative: 6 % (ref 3–12)
Neutro Abs: 4.9 10*3/uL (ref 1.7–7.7)
Neutrophils Relative %: 71 % (ref 43–77)
WBC: 6.9 10*3/uL (ref 4.0–10.5)

## 2013-01-16 MED ORDER — CITALOPRAM HYDROBROMIDE 20 MG PO TABS: 20.0000 mg | ORAL_TABLET | Freq: Every day | ORAL | Status: DC

## 2013-01-16 MED ORDER — PRAVASTATIN SODIUM 40 MG PO TABS: 40.0000 mg | ORAL_TABLET | Freq: Every day | ORAL | Status: DC

## 2013-01-16 MED ORDER — OXYBUTYNIN CHLORIDE 5 MG PO TABS: 5.0000 mg | ORAL_TABLET | Freq: Three times a day (TID) | ORAL | Status: DC

## 2013-01-16 MED ORDER — VALACYCLOVIR HCL 1 G PO TABS: 1000.0000 mg | ORAL_TABLET | Freq: Two times a day (BID) | ORAL | Status: DC

## 2013-01-16 NOTE — Patient Instructions (Addendum)
1.  RECOMMEND THUMB SPICA SPLINT (WRIST SPLINT WITH THUMB IMMOBILIZER) DAILY FOR 2-3 WEEKS AND THEN AS NEEDED.

## 2013-01-16 NOTE — Progress Notes (Signed)
[address]   [place], [place]  [address]   [phone]  Subjective:    Patient ID: Maria Sweeney, female    DOB: 1950-10-30, 62 y.o.   MRN: [id number]  HPI This 62 y.o. female presents for six month follow-up for the following:   1. Depression with anxiety:  Last 6 months "pretty good." Has had episodes of getting really hot. Problems with relationship and financial problems. Financial problems improved. Son has primary custody of 36 year old grandson. Her son and grandson no longer live with her. This is better for her. Son is dating and is happy.  Still working 2 part time jobs. Body having trouble with inconsistent schedule. Lacks energy. Falls asleep in chair. Drawing SS and not currently looking for other work. Would like to work less and be able to support herself and do things she enjoys.   Denies SI.  Reports good compliance with Citalopram; good tolerance to medication; good symptom control.   2. Urinary incontinence:  Has incontinence with urgency. Has been happening for awhile. Occasional enuresis- not for several months. Usually with deep sleep, stress, will suffer with enuresis. Has had itchy rash in inguinal region from jeans and working in heat. Using OTC antifungal. Groin stays moist from sweat. Not sure if she leaks urine. Wears panty liners, doesn't have to change frequently. Consumes very little caffeine. Drinks lots of green tea, not sure if it has caffeine in it.   3. Chest pain:  Had pain in left side of lateral rib cage, up chest across arm up into shoulder last week. Not SOB, no nausea, no diaphoresis. Thought it might be gas. Started while watching TV. Finally went to sleep. Pain resolved spontaneously. No sweating or clamminess. Was able to sleep all night. No additional episodes. Grandmother had rapid heartbeat. No early CAD in family.  Walks dog daily; no chest pain with exertion.  Decreased stamina with ambulation/exertion.   Has not had high blood pressure in the  past.  Not exercising as much as previously. Noticed decreased stamina.  Has recently gained a lot of weight. Usually weighs 118, now 130. Concerned because of FH of DM.  4.  R wrist pain:  Fell on right wrist one year ago and it cleared up. This spring, fell again on right hand in bathtub. Fell onto outstretched hand.  Has noticed weakness, swelling, pain intermittent in R wrist. Limits her ability to do things- writing, opening jars. No numbness or tingling. Especially painful in thumb. After fall, area was warm, "had fever in it." Iced it initially. Takes ibuprofen. Had tennis elbow and rotator cuff surgery all on right side in past.  5. Hyperlipidemia: six month follow-up; reports compliance with Pravastatin; good tolerance to medication; due for labs; fasting today.   Review of Systems  Constitutional: Positive for fatigue and unexpected weight change. Negative for fever, chills and diaphoresis.  Respiratory: Negative for shortness of breath, wheezing and stridor.   Cardiovascular: Positive for chest pain. Negative for palpitations and leg swelling.  Gastrointestinal: Positive for abdominal pain. Negative for nausea, vomiting, diarrhea, constipation and abdominal distention.  Genitourinary: Positive for urgency. Negative for dysuria, frequency, hematuria, flank pain, decreased urine volume, difficulty urinating and genital sores.  Musculoskeletal: Positive for myalgias, joint swelling and arthralgias. Negative for back pain and gait problem.  Neurological: Negative for dizziness, tremors, syncope, facial asymmetry, weakness, light-headedness, numbness and headaches.  Psychiatric/Behavioral: Positive for dysphoric mood. Negative for suicidal ideas, sleep disturbance and self-injury. The  patient is nervous/anxious.    Past Medical History  Diagnosis Date  . Depression   . GERD (gastroesophageal reflux disease)   . Anxiety   . Hyperlipidemia   . Oral herpes   . Hypertension   . IBS  (irritable bowel syndrome)     Constipation with diarrhea.   Allergies  Allergen Reactions  . Amoxicillin   . Augmentin [Amoxicillin-Pot Clavulanate]   . Codeine   . Depakote [Divalproex Sodium]   . Latex   . Lipitor [Atorvastatin]   . Penicillins   . Septra [Sulfamethoxazole W-Trimethoprim]   . Zocor [Simvastatin]    Current Outpatient Prescriptions on File Prior to Visit  Medication Sig Dispense Refill  . omeprazole (PRILOSEC) 40 MG capsule Take 1 capsule (40 mg total) by mouth daily.  30 capsule  11  . clonazePAM (KLONOPIN) 1 MG tablet Take 1 tablet (1 mg total) by mouth 2 (two) times daily as needed for anxiety.  60 tablet  5   No current facility-administered medications on file prior to visit.   History   Social History  . Marital Status: Divorced    Spouse Name: N/A    Number of Children: 2  . Years of Education: N/A   Occupational History  . 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   Social History Main Topics  . Smoking status: Never Smoker   . Smokeless tobacco: Never Used  . Alcohol Use: 0.6 oz/week    1 Glasses of wine per week     Comment: rare alcohol use; once per year on average.  . Drug Use: No  . Sexual Activity: Yes    Partners: Male    Birth Control/ Protection: Surgical   Other Topics Concern  . Not on file   Social History Narrative   Marital status: divorced since 1999; dating.      Children: one son, one daughter; 2 grandchildren.      Lives: alone     Employment:  Two part-time jobs at [employer] and [employer]      Tobacco: none      Alcohol: rare      Drugs: none      Exercise:  sporadic   Family History  Problem Relation Age of Onset  . Hyperlipidemia Mother   . Hypertension Mother   . Cancer Mother     lung  . Diabetes Mother   . Glaucoma Mother   . Stroke Mother 55  . Heart disease Mother   . Asthma Father   . Emphysema Father   . COPD Father   . Glaucoma Father   . Hypertension Sister        Objective:    Physical Exam  Nursing note and vitals reviewed. Constitutional: She is oriented to person, place, and time. She appears well-developed and well-nourished. No distress.  HENT:  Head: Normocephalic and atraumatic.  Eyes: Conjunctivae and EOM are normal. Pupils are equal, round, and reactive to light.  Neck: Normal range of motion. Neck supple. No thyromegaly present.  Cardiovascular: Normal rate, regular rhythm and normal heart sounds.  Exam reveals no gallop and no friction rub.   No murmur heard. Pulmonary/Chest: Effort normal and breath sounds normal. She has no wheezes. She has no rales.  Abdominal: Soft. Bowel sounds are normal. She exhibits no distension and no mass. There is no tenderness. There is no rebound and no guarding.  Musculoskeletal:       Right wrist: She exhibits tenderness  and bony tenderness. She exhibits normal range of motion, no swelling and no deformity.       Right forearm: She exhibits tenderness and bony tenderness. She exhibits no swelling, no edema, no deformity and no laceration.       Right hand: She exhibits normal range of motion, no tenderness, no bony tenderness, normal capillary refill and no swelling. Normal sensation noted. Decreased sensation is not present in the radial distribution. Normal strength noted.  R wrist:  +TTP radial aspect of wrist; no swelling; pain with flexion and extension; no pain with pronation or supination.  Finkelstein's +. R hand: non-tender; no swelling; full ROM of thumb; NT at thumb; motor 5/5. Grip 5/5.  Lymphadenopathy:    She has no cervical adenopathy.  Neurological: She is alert and oriented to person, place, and time. No cranial nerve deficit. She exhibits normal muscle tone. Coordination normal.  Skin: Skin is warm and dry. No rash noted. She is not diaphoretic.  Psychiatric: She has a normal mood and affect. Her behavior is normal. Judgment and thought content normal.       EKG:  SINUS BRADYCARDIA; NO ACUTE  CHANGES.  [facility] reading (PRIMARY) by  Dr. Katrinka Blazing.  R WRIST:  NAD; with radiologist suggesting old healing fracture of distal radius ND.   Assessment & Plan:  Pure hypercholesterolemia - Plan: CBC with Differential, Comprehensive metabolic panel, TSH, Lipid panel  Chest pain - Plan: EKG 12-Lead  Wrist pain, right - Plan: DG Wrist Complete Right  Weight gain - Plan: TSH  Anxiety and depression - Plan: citalopram (CELEXA) 20 MG tablet  Depression with anxiety - Plan: citalopram (CELEXA) 20 MG tablet  HYPERLIPIDEMIA - Plan: pravastatin (PRAVACHOL) 40 MG tablet  Urge incontinence   1.  Hyperlipidemia: stable; obtain labs; continue current medications. 2.  Depression with anxiety: stable; no change in medications at this time.  Refills provided. 3.  Chest pain:  New. Atypical; non-exertional; pain-free today.  RTC if recurs.  Stable EKG. 4.  Urge Incontinence:  New/worsening; trial of Oxybutynin 5mg  tid.  Avoid caffeine intake. 5.  R wrist pain with likely recent distal radius fracture:  New. Onset after fall onto outstretched hand; no evidence of scaphoid fracture and no snuffbox tenderness; however evidence of possible old healing fracture of distal radius.  Recommend thumb spica splint daily for three weeks; recommend passive ROM qhs; also recommend Ibuprofen scheduled tid for one week and then PRN.  Ice wrist qhs.  6. Unintentional weight loss: worsening; obtain TSH.  Meds ordered this encounter  Medications  . oxybutynin (DITROPAN) 5 MG tablet    Sig: Take 1 tablet (5 mg total) by mouth 3 (three) times daily.    Dispense:  90 tablet    Refill:  5  . citalopram (CELEXA) 20 MG tablet    Sig: Take 1 tablet (20 mg total) by mouth daily.    Dispense:  30 tablet    Refill:  5  . pravastatin (PRAVACHOL) 40 MG tablet    Sig: Take 1 tablet (40 mg total) by mouth at bedtime.    Dispense:  30 tablet    Refill:  5  . valACYclovir (VALTREX) 1000 MG tablet    Sig: Take 1 tablet (1,000 mg  total) by mouth 2 (two) times daily.    Dispense:  30 tablet    Refill:  1

## 2013-01-17 LAB — LIPID PANEL
LDL Cholesterol: 88 mg/dL (ref 0–99)
VLDL: 34 mg/dL (ref 0–40)

## 2013-01-17 LAB — COMPREHENSIVE METABOLIC PANEL
AST: 16 U/L (ref 0–37)
Albumin: 4.4 g/dL (ref 3.5–5.2)
Alkaline Phosphatase: 77 U/L (ref 39–117)
Calcium: 9.8 mg/dL (ref 8.4–10.5)
Chloride: 108 mEq/L (ref 96–112)
Glucose, Bld: 97 mg/dL (ref 70–99)
Potassium: 4 mEq/L (ref 3.5–5.3)
Sodium: 142 mEq/L (ref 135–145)
Total Protein: 6.6 g/dL (ref 6.0–8.3)

## 2013-01-30 ENCOUNTER — Other Ambulatory Visit: Payer: Self-pay | Admitting: Family Medicine

## 2013-01-31 NOTE — Telephone Encounter (Signed)
Please call in refill of Clonazepam.

## 2013-02-10 NOTE — Telephone Encounter (Signed)
Checked w/pharmacy to make sure this has been called in and they verified they did receive Rx.

## 2013-07-08 ENCOUNTER — Other Ambulatory Visit: Payer: Self-pay | Admitting: Family Medicine

## 2013-07-17 ENCOUNTER — Ambulatory Visit: Payer: Self-pay | Admitting: Family Medicine

## 2013-08-17 ENCOUNTER — Other Ambulatory Visit: Payer: Self-pay | Admitting: Family Medicine

## 2013-09-22 ENCOUNTER — Other Ambulatory Visit: Payer: Self-pay | Admitting: Radiology

## 2013-09-22 DIAGNOSIS — K219 Gastro-esophageal reflux disease without esophagitis: Secondary | ICD-10-CM

## 2013-09-22 NOTE — Telephone Encounter (Signed)
Patient has appointment with you next week, needs meds, pended Pravastatin, Citalopram and Omeprazole. Please advise

## 2013-09-23 ENCOUNTER — Other Ambulatory Visit: Payer: Self-pay | Admitting: Family Medicine

## 2013-09-26 MED ORDER — PRAVASTATIN SODIUM 40 MG PO TABS: 40.0000 mg | ORAL_TABLET | Freq: Every day | ORAL | Status: DC

## 2013-09-26 MED ORDER — OMEPRAZOLE 40 MG PO CPDR: 40.0000 mg | DELAYED_RELEASE_CAPSULE | Freq: Every day | ORAL | Status: DC

## 2013-09-26 MED ORDER — CITALOPRAM HYDROBROMIDE 20 MG PO TABS: 20.0000 mg | ORAL_TABLET | Freq: Every day | ORAL | Status: DC

## 2013-09-27 ENCOUNTER — Ambulatory Visit: Payer: Self-pay | Admitting: Family Medicine

## 2013-10-18 ENCOUNTER — Ambulatory Visit (INDEPENDENT_AMBULATORY_CARE_PROVIDER_SITE_OTHER): Payer: Self-pay | Admitting: Family Medicine

## 2013-10-18 ENCOUNTER — Encounter: Payer: Self-pay | Admitting: Family Medicine

## 2013-10-18 VITALS — BP 160/80 | HR 72 | Temp 98.4°F | Resp 16 | Ht 60.25 in | Wt 124.4 lb

## 2013-10-18 DIAGNOSIS — M545 Low back pain, unspecified: Secondary | ICD-10-CM

## 2013-10-18 DIAGNOSIS — F419 Anxiety disorder, unspecified: Secondary | ICD-10-CM

## 2013-10-18 DIAGNOSIS — B001 Herpesviral vesicular dermatitis: Secondary | ICD-10-CM

## 2013-10-18 DIAGNOSIS — G4700 Insomnia, unspecified: Secondary | ICD-10-CM

## 2013-10-18 DIAGNOSIS — K219 Gastro-esophageal reflux disease without esophagitis: Secondary | ICD-10-CM

## 2013-10-18 DIAGNOSIS — B009 Herpesviral infection, unspecified: Secondary | ICD-10-CM

## 2013-10-18 DIAGNOSIS — I10 Essential (primary) hypertension: Secondary | ICD-10-CM

## 2013-10-18 DIAGNOSIS — F329 Major depressive disorder, single episode, unspecified: Secondary | ICD-10-CM

## 2013-10-18 DIAGNOSIS — E7800 Pure hypercholesterolemia, unspecified: Secondary | ICD-10-CM

## 2013-10-18 DIAGNOSIS — L989 Disorder of the skin and subcutaneous tissue, unspecified: Secondary | ICD-10-CM

## 2013-10-18 DIAGNOSIS — N318 Other neuromuscular dysfunction of bladder: Secondary | ICD-10-CM

## 2013-10-18 DIAGNOSIS — N3281 Overactive bladder: Secondary | ICD-10-CM

## 2013-10-18 DIAGNOSIS — F341 Dysthymic disorder: Secondary | ICD-10-CM

## 2013-10-18 LAB — COMPLETE METABOLIC PANEL WITH GFR
ALT: 14 U/L (ref 0–35)
AST: 16 U/L (ref 0–37)
Albumin: 4.7 g/dL (ref 3.5–5.2)
Alkaline Phosphatase: 86 U/L (ref 39–117)
BUN: 12 mg/dL (ref 6–23)
CO2: 25 mEq/L (ref 19–32)
CREATININE: 0.74 mg/dL (ref 0.50–1.10)
Calcium: 9.6 mg/dL (ref 8.4–10.5)
Chloride: 106 mEq/L (ref 96–112)
GFR, Est Non African American: 86 mL/min
Glucose, Bld: 91 mg/dL (ref 70–99)
Potassium: 4.3 mEq/L (ref 3.5–5.3)
Sodium: 141 mEq/L (ref 135–145)
Total Bilirubin: 0.9 mg/dL (ref 0.2–1.2)
Total Protein: 7 g/dL (ref 6.0–8.3)

## 2013-10-18 LAB — LIPID PANEL
CHOLESTEROL: 198 mg/dL (ref 0–200)
HDL: 58 mg/dL (ref >39)
LDL Cholesterol: 106 mg/dL — ABNORMAL HIGH (ref 0–99)
TRIGLYCERIDES: 172 mg/dL — AB (ref <150)
Total CHOL/HDL Ratio: 3.4 Ratio
VLDL: 34 mg/dL (ref 0–40)

## 2013-10-18 MED ORDER — CLONAZEPAM 1 MG PO TABS: ORAL_TABLET | ORAL | Status: DC

## 2013-10-18 MED ORDER — CITALOPRAM HYDROBROMIDE 40 MG PO TABS: 40.0000 mg | ORAL_TABLET | Freq: Every day | ORAL | Status: DC

## 2013-10-18 MED ORDER — VALACYCLOVIR HCL 1 G PO TABS: 1000.0000 mg | ORAL_TABLET | Freq: Two times a day (BID) | ORAL | Status: DC

## 2013-10-18 MED ORDER — OMEPRAZOLE 40 MG PO CPDR: 40.0000 mg | DELAYED_RELEASE_CAPSULE | Freq: Every day | ORAL | Status: DC

## 2013-10-18 MED ORDER — METOPROLOL SUCCINATE ER 50 MG PO TB24
50.0000 mg | ORAL_TABLET | Freq: Every day | ORAL | Status: DC
Start: 2013-10-18 — End: 2013-10-18

## 2013-10-18 MED ORDER — PRAVASTATIN SODIUM 40 MG PO TABS: 40.0000 mg | ORAL_TABLET | Freq: Every day | ORAL | Status: DC

## 2013-10-18 MED ORDER — METOPROLOL SUCCINATE ER 50 MG PO TB24: 50.0000 mg | ORAL_TABLET | Freq: Every day | ORAL | Status: DC

## 2013-10-18 NOTE — Progress Notes (Signed)
Subjective:  This chart was scribed for Wardell Honour, MD by Ladene Artist, ED Scribe. The patient was seen in room 24. Patient's care was started at 3:33 PM.   Patient ID: Maria Sweeney, female    DOB: 1950/09/09, 63 y.o.   MRN: [id number]  10/18/2013  Medication Refill, Depression and Hyperlipidemia  HPI HPI Comments: Maria Sweeney is a 63 y.o. female who presents to the [facility] for medication refill and nine month follow-up on hyperlipidemia, anxiety/depression.   Pt states that she did not realize how depressed she was until she took the PHQ-9 today. Pt states that she feels down and hopeless daily. She also reports disinterest in doing things. Pt reports having a lot of responsibility. She states that her son received a DWI and she has to take him to work. Pt's son quit drinking and his wife left him with his son. She states that she stopped working full time in 2009. She has a part time job at the service desk at [employer] She enjoys her job but states that they have decreased her hours. She also states that she has been dealing with lawyers for custody of her grandson over the past few years. Her son does not live with her. She states that she does not mind taking him to work since she likes to spend time with them. She denies SI. She states "I will never hurt myself, I just want to enjoy living".  Pt also presents with drainage and throat irritation that is worsened at night. Pt has a h/o GERD. She states that she experiences hoarseness and scratchy throat if she does not take Prilosec 40mg  bid.  However, she has been advised that she should not take Prilosec bid if also taking Celexa for depression. S/p GI consultation with EGD in the past; GI specialist recommended her take Prilosec bid.  Denies n/v/d/c; denies melena.    Pt presents with difficulty sleeping at night. Pt states that she takes her son to work at 7 AM and then naps. She reports feeling sluggish  during the day. Pt has taken Ambien years ago for sleep. She has also tried Benadryl but states that she still feels drowsy the next day.  Pt also presents with chronic intermittent back pain with recent worsening from a fall that occurred in November. Pt reports pain that radiates down her leg and into her groin. Pt was supposed to have a MRI at the advice of Dr. Joseph Art but states that she was not able to go due to financial limitations.    She also presents with a skin spot on her L shoulder and R chest. She reports change in color and expresses concerns of seeing a dermatologist.  Pt reports that previous chest pain has resolved. She does not check her BP at home. BP was 152/86 last visit and 160/80 today. She states that she no longer checks her BP at the pharmacy either. Pt reports nausea, palpitations and facial redness when working in her yard and mowing the grass. She also reports intermittent palpitations when at home during the night. Denies chest pain but does admit that she is not currently exercising other than mowing the grass once per week.  Pt states that she she enjoys working in her yard. Pt also reads and listens to the TV but does not watch it. Pt took ballroom dance for 5 years and reports going ballroom dancing twice with a partner.  Pt is taking her cholesterol medication regularly. She denies exercise other than gardening. She states that she does not have any motivation.  Pt states that she never tried the medication she was prescribed last visit for urinary symptoms. She states that she was afraid that she would have difficulty urinating.   Pt is no longer dating the guy she dated for 5 years. She states that he has health complications and did not want to do things with her. Pt states that she started seeing another guy with a h/o alcohol abuse, who works in [place], [place]. She states that she only gets to see him on the weekend. Pt states that she recently spoke with  him regarding splitting because she would like to see him more often. She states that she is bored with him.  Review of Systems  Constitutional: Positive for fatigue. Negative for fever, chills and diaphoresis.  Eyes: Negative for visual disturbance.  Respiratory: Negative for shortness of breath, wheezing and stridor.   Cardiovascular: Positive for palpitations. Negative for chest pain.  Gastrointestinal: Positive for nausea. Negative for vomiting, abdominal pain, diarrhea, constipation, blood in stool and anal bleeding.  Genitourinary: Positive for frequency. Negative for dysuria, urgency and flank pain.  Musculoskeletal: Positive for back pain and myalgias.  Neurological: Negative for dizziness, syncope, speech difficulty, weakness, light-headedness, numbness and headaches.  Psychiatric/Behavioral: Positive for sleep disturbance and dysphoric mood. Negative for suicidal ideas. The patient is nervous/anxious.    Past Medical History  Diagnosis Date  . Depression   . GERD (gastroesophageal reflux disease)   . Anxiety   . Hyperlipidemia   . Oral herpes   . Hypertension   . IBS (irritable bowel syndrome)     Constipation with diarrhea.   Past Surgical History  Procedure Laterality Date  . Breast biopsy Bilateral 2009    Calcium Deposit- 1980's and also in 2009/ Right side 1995  . Rotator cuff repair    . Debridement tennis elbow    . Appendectomy  1988  . Abdominal hysterectomy  1978    DUB; ovaries intact.  . Colonoscopy  05/11/2009    Normal.  Previous colonoscopy with polyps. [facility].      Allergies  Allergen Reactions  . Amoxicillin   . Augmentin [Amoxicillin-Pot Clavulanate]   . Codeine   . Depakote [Divalproex Sodium]   . Latex   . Lipitor [Atorvastatin]   . Penicillins   . Septra [Sulfamethoxazole-Trimethoprim]   . Zocor [Simvastatin]    Current Outpatient Prescriptions  Medication Sig Dispense Refill  . citalopram (CELEXA) 40 MG tablet Take 1 tablet (40 mg  total) by mouth daily. PATIENT NEEDS OFFICE VISIT FOR ADDITIONAL REFILLS  30 tablet  5  . clonazePAM (KLONOPIN) 1 MG tablet TAKE 1 TABLET TWICE DAILY AS NEEDED FOR ANXIETY  60 tablet  5  . omeprazole (PRILOSEC) 40 MG capsule Take 1 capsule (40 mg total) by mouth daily.  30 capsule  11  . pravastatin (PRAVACHOL) 40 MG tablet Take 1 tablet (40 mg total) by mouth daily.  30 tablet  5  . valACYclovir (VALTREX) 1000 MG tablet Take 1 tablet (1,000 mg total) by mouth 2 (two) times daily.  30 tablet  1  . metoprolol succinate (TOPROL-XL) 50 MG 24 hr tablet Take 1 tablet (50 mg total) by mouth daily. Start with 1/2 tablet daily for two weeks and then increase to 1 tablet daily.  Take with or immediately following a meal.  30 tablet  5  . oxybutynin (  DITROPAN) 5 MG tablet Take 1 tablet (5 mg total) by mouth 3 (three) times daily.  90 tablet  5   No current facility-administered medications for this visit.   History   Social History  . Marital Status: Divorced    Spouse Name: N/A    Number of Children: 2  . Years of Education: N/A   Occupational History  . 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   Social History Main Topics  . Smoking status: Never Smoker   . Smokeless tobacco: Never Used  . Alcohol Use: 0.6 oz/week    1 Glasses of wine per week     Comment: rare alcohol use; once per year on average.  . Drug Use: No  . Sexual Activity: Yes    Partners: Male    Birth Control/ Protection: Surgical   Other Topics Concern  . Not on file   Social History Narrative   Marital status: divorced since 1999; dating.      Children: one son, one daughter; 2 grandchildren.      Lives: alone     Employment:  part-time jobs at  [employer].      Tobacco: none      Alcohol: rare      Drugs: none      Exercise:  sporadic   Family History  Problem Relation Age of Onset  . Hyperlipidemia Mother   . Hypertension Mother   . Cancer Mother     lung  . Diabetes Mother   .  Glaucoma Mother   . Stroke Mother 60  . Heart disease Mother   . Asthma Father   . Emphysema Father   . COPD Father   . Glaucoma Father   . Hypertension Sister        Objective:    Triage Vitals: BP 160/80  Pulse 72  Temp(Src) 98.4 F (36.9 C) (Oral)  Resp 16  Ht 5' 0.25" (1.53 m)  Wt 124 lb 6.4 oz (56.427 kg)  BMI 24.10 kg/m2  SpO2 99% Physical Exam  Nursing note and vitals reviewed. Constitutional: She is oriented to person, place, and time. She appears well-developed and well-nourished. No distress.  HENT:  Head: Normocephalic and atraumatic.  Right Ear: External ear normal.  Left Ear: External ear normal.  Nose: Nose normal.  Mouth/Throat: Oropharynx is clear and moist.  Eyes: Conjunctivae and EOM are normal. Pupils are equal, round, and reactive to light.  Neck: Normal range of motion. Neck supple. Carotid bruit is not present. No thyromegaly present.  Cardiovascular: Normal rate, regular rhythm, normal heart sounds and intact distal pulses.  Exam reveals no gallop and no friction rub.   No murmur heard. Pulmonary/Chest: Effort normal and breath sounds normal. She has no wheezes. She has no rales.  Abdominal: Soft. Bowel sounds are normal. She exhibits no distension and no mass. There is no tenderness. There is no rebound and no guarding.  Musculoskeletal: Normal range of motion.       Lumbar back: She exhibits normal range of motion, no tenderness, no bony tenderness, no pain, no spasm and normal pulse.  Lumbar spine:  No midline TTP or paraspinal TTP; straight leg raises negative; motor 5/5 BLE; marching intact; toe and heel walking intact.  Lymphadenopathy:    She has no cervical adenopathy.  Neurological: She is alert and oriented to person, place, and time. No cranial nerve deficit.  Skin: Skin is warm and dry. No rash noted.  She is not diaphoretic. No erythema. No pallor.  Anterior chest with two discrete lesions 41mm in diameter consistent with seborrhea  keratoses yet L breast lesion with color variation.  Psychiatric: She has a normal mood and affect. Her behavior is normal.   Results for orders placed in visit on 10/18/13  COMPLETE METABOLIC PANEL WITH GFR      Result Value Ref Range   Sodium 141  135 - 145 mEq/L   Potassium 4.3  3.5 - 5.3 mEq/L   Chloride 106  96 - 112 mEq/L   CO2 25  19 - 32 mEq/L   Glucose, Bld 91  70 - 99 mg/dL   BUN 12  6 - 23 mg/dL   Creat 0.74  0.50 - 1.10 mg/dL   Total Bilirubin 0.9  0.2 - 1.2 mg/dL   Alkaline Phosphatase 86  39 - 117 U/L   AST 16  0 - 37 U/L   ALT 14  0 - 35 U/L   Total Protein 7.0  6.0 - 8.3 g/dL   Albumin 4.7  3.5 - 5.2 g/dL   Calcium 9.6  8.4 - 10.5 mg/dL   GFR, Est African American >89     GFR, Est Non African American 86    LIPID PANEL      Result Value Ref Range   Cholesterol 198  0 - 200 mg/dL   Triglycerides 172 (*) <150 mg/dL   HDL 58  >39 mg/dL   Total CHOL/HDL Ratio 3.4     VLDL 34  0 - 40 mg/dL   LDL Cholesterol 106 (*) 0 - 99 mg/dL       Assessment & Plan:   1. Pure hypercholesterolemia   2. GERD (gastroesophageal reflux disease)   3. Anxiety and depression   4. Low back pain   5. Skin lesions   6. Insomnia   7. Essential hypertension, benign   8. Overactive bladder   9. Herpes labialis    1.  Hypercholesterolemia: Moderately controlled; refill of medication provided. 2.  GERD: uncontrolled; continue Prilosec 40mg  daily; add Ranitidine 150mg  qhs. 3.  Anxiety and depression: uncontrolled; increase Citalopram to 40mg  daily; refill of Klonopin provided.   4.  Low back pain: New to this provider; recent worsening but chronic ongoing issue for patient; recommend home exercise program to perform daily. 5.  Skin lesions:  New. Consistent with seborrhea keratoses yet some color variation; recommend dermatology consultation; pt to schedule appointment. 6.  Insomnia: worsening due to uncontrolled depression; increase Citalopram to 40mg  daily; anticipate insomnia to  improve. 7.  HTN:  New onset; rx for Metoprolol XL 50mg  daily provided.  Obtain u/a at next visit.  8.  Overactive bladder: uncontrolled; never started Ditropan after last visit.  May try now. 9. HSV labialis:  Stable; refill of Valtrex provided.  Meds ordered this encounter  Medications  . citalopram (CELEXA) 40 MG tablet    Sig: Take 1 tablet (40 mg total) by mouth daily. PATIENT NEEDS OFFICE VISIT FOR ADDITIONAL REFILLS    Dispense:  30 tablet    Refill:  5  . pravastatin (PRAVACHOL) 40 MG tablet    Sig: Take 1 tablet (40 mg total) by mouth daily.    Dispense:  30 tablet    Refill:  5  . omeprazole (PRILOSEC) 40 MG capsule    Sig: Take 1 capsule (40 mg total) by mouth daily.    Dispense:  30 capsule    Refill:  11  Order Specific Question:  Supervising Provider    Answer:  DOOLITTLE, ROBERT P [[id number]]  . clonazePAM (KLONOPIN) 1 MG tablet    Sig: TAKE 1 TABLET TWICE DAILY AS NEEDED FOR ANXIETY    Dispense:  60 tablet    Refill:  5  . valACYclovir (VALTREX) 1000 MG tablet    Sig: Take 1 tablet (1,000 mg total) by mouth 2 (two) times daily.    Dispense:  30 tablet    Refill:  1  . DISCONTD: metoprolol succinate (TOPROL-XL) 50 MG 24 hr tablet    Sig: Take 1 tablet (50 mg total) by mouth daily. Start with 1/2 tablet daily for two weeks and then increase to 1 tablet daily.  Take with or immediately following a meal.    Dispense:  30 tablet    Refill:  5  . metoprolol succinate (TOPROL-XL) 50 MG 24 hr tablet    Sig: Take 1 tablet (50 mg total) by mouth daily. Start with 1/2 tablet daily for two weeks and then increase to 1 tablet daily.  Take with or immediately following a meal.    Dispense:  30 tablet    Refill:  5    Return in about 6 months (around 04/19/2014) for recheck.   I personally performed the services described in this documentation, which was scribed in my presence. The recorded information has been reviewed and is accurate.  Reginia Forts, M.D.  [facility] [address] [place], [place]  [phone] phone [phone] fax

## 2013-10-18 NOTE — Patient Instructions (Signed)
1.  ADD ZANTAC/RANITIDINE 150MG  ONE TABLET AT BEDTIME FOR REFLUX.   Sciatica with Rehab The sciatic nerve runs from the back down the leg and is responsible for sensation and control of the muscles in the back (posterior) side of the thigh, lower leg, and foot. Sciatica is a condition that is characterized by inflammation of this nerve.  SYMPTOMS   Signs of nerve damage, including numbness and/or weakness along the posterior side of the lower extremity.  Pain in the back of the thigh that may also travel down the leg.  Pain that worsens when sitting for long periods of time.  Occasionally, pain in the back or buttock. CAUSES  Inflammation of the sciatic nerve is the cause of sciatica. The inflammation is due to something irritating the nerve. Common sources of irritation include:  Sitting for long periods of time.  Direct trauma to the nerve.  Arthritis of the spine.  Herniated or ruptured disk.  Slipping of the vertebrae (spondylolithesis)  Pressure from soft tissues, such as muscles or ligament-like tissue (fascia). RISK INCREASES WITH:  Sports that place pressure or stress on the spine (football or weightlifting).  Poor strength and flexibility.  Failure to warm-up properly before activity.  Family history of low back pain or disk disorders.  Previous back injury or surgery.  Poor body mechanics, especially when lifting, or poor posture. PREVENTION   Warm up and stretch properly before activity.  Maintain physical fitness:  Strength, flexibility, and endurance.  Cardiovascular fitness.  Learn and use proper technique, especially with posture and lifting. When possible, have coach correct improper technique.  Avoid activities that place stress on the spine. PROGNOSIS If treated properly, then sciatica usually resolves within 6 weeks. However, occasionally surgery is necessary.  RELATED COMPLICATIONS   Permanent nerve damage, including pain, numbness,  tingle, or weakness.  Chronic back pain.  Risks of surgery: infection, bleeding, nerve damage, or damage to surrounding tissues. TREATMENT Treatment initially involves resting from any activities that aggravate your symptoms. The use of ice and medication may help reduce pain and inflammation. The use of strengthening and stretching exercises may help reduce pain with activity. These exercises may be performed at home or with referral to a therapist. A therapist may recommend further treatments, such as transcutaneous electronic nerve stimulation (TENS) or ultrasound. Your caregiver may recommend corticosteroid injections to help reduce inflammation of the sciatic nerve. If symptoms persist despite non-surgical (conservative) treatment, then surgery may be recommended. MEDICATION  If pain medication is necessary, then nonsteroidal anti-inflammatory medications, such as aspirin and ibuprofen, or other minor pain relievers, such as acetaminophen, are often recommended.  Do not take pain medication for 7 days before surgery.  Prescription pain relievers may be given if deemed necessary by your caregiver. Use only as directed and only as much as you need.  Ointments applied to the skin may be helpful.  Corticosteroid injections may be given by your caregiver. These injections should be reserved for the most serious cases, because they may only be given a certain number of times. HEAT AND COLD  Cold treatment (icing) relieves pain and reduces inflammation. Cold treatment should be applied for 10 to 15 minutes every 2 to 3 hours for inflammation and pain and immediately after any activity that aggravates your symptoms. Use ice packs or massage the area with a piece of ice (ice massage).  Heat treatment may be used prior to performing the stretching and strengthening activities prescribed by your caregiver, physical therapist, or athletic  trainer. Use a heat pack or soak the injury in warm  water. SEEK MEDICAL CARE IF:  Treatment seems to offer no benefit, or the condition worsens.  Any medications produce adverse side effects. EXERCISES  RANGE OF MOTION (ROM) AND STRETCHING EXERCISES - Sciatica Most people with sciatic will find that their symptoms worsen with either excessive bending forward (flexion) or arching at the low back (extension). The exercises which will help resolve your symptoms will focus on the opposite motion. Your physician, physical therapist or athletic trainer will help you determine which exercises will be most helpful to resolve your low back pain. Do not complete any exercises without first consulting with your clinician. Discontinue any exercises which worsen your symptoms until you speak to your clinician. If you have pain, numbness or tingling which travels down into your buttocks, leg or foot, the goal of the therapy is for these symptoms to move closer to your back and eventually resolve. Occasionally, these leg symptoms will get better, but your low back pain may worsen; this is typically an indication of progress in your rehabilitation. Be certain to be very alert to any changes in your symptoms and the activities in which you participated in the 24 hours prior to the change. Sharing this information with your clinician will allow him/her to most efficiently treat your condition. These exercises may help you when beginning to rehabilitate your injury. Your symptoms may resolve with or without further involvement from your physician, physical therapist or athletic trainer. While completing these exercises, remember:   Restoring tissue flexibility helps normal motion to return to the joints. This allows healthier, less painful movement and activity.  An effective stretch should be held for at least 30 seconds.  A stretch should never be painful. You should only feel a gentle lengthening or release in the stretched tissue. FLEXION RANGE OF MOTION AND  STRETCHING EXERCISES: STRETCH  Flexion, Single Knee to Chest   Lie on a firm bed or floor with both legs extended in front of you.  Keeping one leg in contact with the floor, bring your opposite knee to your chest. Hold your leg in place by either grabbing behind your thigh or at your knee.  Pull until you feel a gentle stretch in your low back. Hold __________ seconds.  Slowly release your grasp and repeat the exercise with the opposite side. Repeat __________ times. Complete this exercise __________ times per day.  STRETCH  Flexion, Double Knee to Chest  Lie on a firm bed or floor with both legs extended in front of you.  Keeping one leg in contact with the floor, bring your opposite knee to your chest.  Tense your stomach muscles to support your back and then lift your other knee to your chest. Hold your legs in place by either grabbing behind your thighs or at your knees.  Pull both knees toward your chest until you feel a gentle stretch in your low back. Hold __________ seconds.  Tense your stomach muscles and slowly return one leg at a time to the floor. Repeat __________ times. Complete this exercise __________ times per day.  STRETCH  Low Trunk Rotation   Lie on a firm bed or floor. Keeping your legs in front of you, bend your knees so they are both pointed toward the ceiling and your feet are flat on the floor.  Extend your arms out to the side. This will stabilize your upper body by keeping your shoulders in contact with the floor.  Gently and slowly drop both knees together to one side until you feel a gentle stretch in your low back. Hold for __________ seconds.  Tense your stomach muscles to support your low back as you bring your knees back to the starting position. Repeat the exercise to the other side. Repeat __________ times. Complete this exercise __________ times per day  EXTENSION RANGE OF MOTION AND FLEXIBILITY EXERCISES: STRETCH  Extension, Prone on  Elbows  Lie on your stomach on the floor, a bed will be too soft. Place your palms about shoulder width apart and at the height of your head.  Place your elbows under your shoulders. If this is too painful, stack pillows under your chest.  Allow your body to relax so that your hips drop lower and make contact more completely with the floor.  Hold this position for __________ seconds.  Slowly return to lying flat on the floor. Repeat __________ times. Complete this exercise __________ times per day.  RANGE OF MOTION  Extension, Prone Press Ups  Lie on your stomach on the floor, a bed will be too soft. Place your palms about shoulder width apart and at the height of your head.  Keeping your back as relaxed as possible, slowly straighten your elbows while keeping your hips on the floor. You may adjust the placement of your hands to maximize your comfort. As you gain motion, your hands will come more underneath your shoulders.  Hold this position __________ seconds.  Slowly return to lying flat on the floor. Repeat __________ times. Complete this exercise __________ times per day.  STRENGTHENING EXERCISES - Sciatica  These exercises may help you when beginning to rehabilitate your injury. These exercises should be done near your "sweet spot." This is the neutral, low-back arch, somewhere between fully rounded and fully arched, that is your least painful position. When performed in this safe range of motion, these exercises can be used for people who have either a flexion or extension based injury. These exercises may resolve your symptoms with or without further involvement from your physician, physical therapist or athletic trainer. While completing these exercises, remember:   Muscles can gain both the endurance and the strength needed for everyday activities through controlled exercises.  Complete these exercises as instructed by your physician, physical therapist or athletic trainer.  Progress with the resistance and repetition exercises only as your caregiver advises.  You may experience muscle soreness or fatigue, but the pain or discomfort you are trying to eliminate should never worsen during these exercises. If this pain does worsen, stop and make certain you are following the directions exactly. If the pain is still present after adjustments, discontinue the exercise until you can discuss the trouble with your clinician. STRENGTHENING Deep Abdominals, Pelvic Tilt   Lie on a firm bed or floor. Keeping your legs in front of you, bend your knees so they are both pointed toward the ceiling and your feet are flat on the floor.  Tense your lower abdominal muscles to press your low back into the floor. This motion will rotate your pelvis so that your tail bone is scooping upwards rather than pointing at your feet or into the floor.  With a gentle tension and even breathing, hold this position for __________ seconds. Repeat __________ times. Complete this exercise __________ times per day.  STRENGTHENING  Abdominals, Crunches   Lie on a firm bed or floor. Keeping your legs in front of you, bend your knees so they are both pointed  toward the ceiling and your feet are flat on the floor. Cross your arms over your chest.  Slightly tip your chin down without bending your neck.  Tense your abdominals and slowly lift your trunk high enough to just clear your shoulder blades. Lifting higher can put excessive stress on the low back and does not further strengthen your abdominal muscles.  Control your return to the starting position. Repeat __________ times. Complete this exercise __________ times per day.  STRENGTHENING  Quadruped, Opposite UE/LE Lift  Assume a hands and knees position on a firm surface. Keep your hands under your shoulders and your knees under your hips. You may place padding under your knees for comfort.  Find your neutral spine and gently tense your abdominal  muscles so that you can maintain this position. Your shoulders and hips should form a rectangle that is parallel with the floor and is not twisted.  Keeping your trunk steady, lift your right hand no higher than your shoulder and then your left leg no higher than your hip. Make sure you are not holding your breath. Hold this position __________ seconds.  Continuing to keep your abdominal muscles tense and your back steady, slowly return to your starting position. Repeat with the opposite arm and leg. Repeat __________ times. Complete this exercise __________ times per day.  STRENGTHENING  Abdominals and Quadriceps, Straight Leg Raise   Lie on a firm bed or floor with both legs extended in front of you.  Keeping one leg in contact with the floor, bend the other knee so that your foot can rest flat on the floor.  Find your neutral spine, and tense your abdominal muscles to maintain your spinal position throughout the exercise.  Slowly lift your straight leg off the floor about 6 inches for a count of 15, making sure to not hold your breath.  Still keeping your neutral spine, slowly lower your leg all the way to the floor. Repeat this exercise with each leg __________ times. Complete this exercise __________ times per day. POSTURE AND BODY MECHANICS CONSIDERATIONS - Sciatica Keeping correct posture when sitting, standing or completing your activities will reduce the stress put on different body tissues, allowing injured tissues a chance to heal and limiting painful experiences. The following are general guidelines for improved posture. Your physician or physical therapist will provide you with any instructions specific to your needs. While reading these guidelines, remember:  The exercises prescribed by your provider will help you have the flexibility and strength to maintain correct postures.  The correct posture provides the optimal environment for your joints to work. All of your joints have  less wear and tear when properly supported by a spine with good posture. This means you will experience a healthier, less painful body.  Correct posture must be practiced with all of your activities, especially prolonged sitting and standing. Correct posture is as important when doing repetitive low-stress activities (typing) as it is when doing a single heavy-load activity (lifting). RESTING POSITIONS Consider which positions are most painful for you when choosing a resting position. If you have pain with flexion-based activities (sitting, bending, stooping, squatting), choose a position that allows you to rest in a less flexed posture. You would want to avoid curling into a fetal position on your side. If your pain worsens with extension-based activities (prolonged standing, working overhead), avoid resting in an extended position such as sleeping on your stomach. Most people will find more comfort when they rest with their spine  in a more neutral position, neither too rounded nor too arched. Lying on a non-sagging bed on your side with a pillow between your knees, or on your back with a pillow under your knees will often provide some relief. Keep in mind, being in any one position for a prolonged period of time, no matter how correct your posture, can still lead to stiffness. PROPER SITTING POSTURE In order to minimize stress and discomfort on your spine, you must sit with correct posture Sitting with good posture should be effortless for a healthy body. Returning to good posture is a gradual process. Many people can work toward this most comfortably by using various supports until they have the flexibility and strength to maintain this posture on their own. When sitting with proper posture, your ears will fall over your shoulders and your shoulders will fall over your hips. You should use the back of the chair to support your upper back. Your low back will be in a neutral position, just slightly arched.  You may place a small pillow or folded towel at the base of your low back for support.  When working at a desk, create an environment that supports good, upright posture. Without extra support, muscles fatigue and lead to excessive strain on joints and other tissues. Keep these recommendations in mind: CHAIR:   A chair should be able to slide under your desk when your back makes contact with the back of the chair. This allows you to work closely.  The chair's height should allow your eyes to be level with the upper part of your monitor and your hands to be slightly lower than your elbows. BODY POSITION  Your feet should make contact with the floor. If this is not possible, use a foot rest.  Keep your ears over your shoulders. This will reduce stress on your neck and low back. INCORRECT SITTING POSTURES   If you are feeling tired and unable to assume a healthy sitting posture, do not slouch or slump. This puts excessive strain on your back tissues, causing more damage and pain. Healthier options include:  Using more support, like a lumbar pillow.  Switching tasks to something that requires you to be upright or walking.  Talking a brief walk.  Lying down to rest in a neutral-spine position. PROLONGED STANDING WHILE SLIGHTLY LEANING FORWARD  When completing a task that requires you to lean forward while standing in one place for a long time, place either foot up on a stationary 2-4 inch high object to help maintain the best posture. When both feet are on the ground, the low back tends to lose its slight inward curve. If this curve flattens (or becomes too large), then the back and your other joints will experience too much stress, fatigue more quickly and can cause pain.  CORRECT STANDING POSTURES Proper standing posture should be assumed with all daily activities, even if they only take a few moments, like when brushing your teeth. As in sitting, your ears should fall over your shoulders and  your shoulders should fall over your hips. You should keep a slight tension in your abdominal muscles to brace your spine. Your tailbone should point down to the ground, not behind your body, resulting in an over-extended swayback posture.  INCORRECT STANDING POSTURES  Common incorrect standing postures include a forward head, locked knees and/or an excessive swayback. WALKING Walk with an upright posture. Your ears, shoulders and hips should all line-up. PROLONGED ACTIVITY IN A FLEXED POSITION  When completing a task that requires you to bend forward at your waist or lean over a low surface, try to find a way to stabilize 3 of 4 of your limbs. You can place a hand or elbow on your thigh or rest a knee on the surface you are reaching across. This will provide you more stability so that your muscles do not fatigue as quickly. By keeping your knees relaxed, or slightly bent, you will also reduce stress across your low back. CORRECT LIFTING TECHNIQUES DO :   Assume a wide stance. This will provide you more stability and the opportunity to get as close as possible to the object which you are lifting.  Tense your abdominals to brace your spine; then bend at the knees and hips. Keeping your back locked in a neutral-spine position, lift using your leg muscles. Lift with your legs, keeping your back straight.  Test the weight of unknown objects before attempting to lift them.  Try to keep your elbows locked down at your sides in order get the best strength from your shoulders when carrying an object.  Always ask for help when lifting heavy or awkward objects. INCORRECT LIFTING TECHNIQUES DO NOT:   Lock your knees when lifting, even if it is a small object.  Bend and twist. Pivot at your feet or move your feet when needing to change directions.  Assume that you cannot safely pick up a paperclip without proper posture. Document Released: 04/27/2005 Document Revised: 07/20/2011 Document Reviewed:  08/09/2008 [facility] Patient Information 2014 [place], [place].

## 2013-10-19 DIAGNOSIS — I10 Essential (primary) hypertension: Secondary | ICD-10-CM | POA: Insufficient documentation

## 2013-10-19 DIAGNOSIS — B001 Herpesviral vesicular dermatitis: Secondary | ICD-10-CM | POA: Insufficient documentation

## 2013-10-19 DIAGNOSIS — G4700 Insomnia, unspecified: Secondary | ICD-10-CM | POA: Insufficient documentation

## 2014-04-19 ENCOUNTER — Other Ambulatory Visit: Payer: Self-pay | Admitting: Family Medicine

## 2014-04-20 NOTE — Telephone Encounter (Signed)
Please call in refill of Clonazepam as approved. 

## 2014-04-20 NOTE — Telephone Encounter (Signed)
faxed

## 2014-04-23 ENCOUNTER — Encounter: Payer: Self-pay | Admitting: Family Medicine

## 2014-04-23 ENCOUNTER — Ambulatory Visit (INDEPENDENT_AMBULATORY_CARE_PROVIDER_SITE_OTHER): Payer: Self-pay | Admitting: Family Medicine

## 2014-04-23 VITALS — BP 159/84 | HR 70 | Temp 98.4°F | Resp 16 | Ht 60.5 in | Wt 130.0 lb

## 2014-04-23 DIAGNOSIS — F418 Other specified anxiety disorders: Secondary | ICD-10-CM

## 2014-04-23 DIAGNOSIS — E7800 Pure hypercholesterolemia, unspecified: Secondary | ICD-10-CM

## 2014-04-23 DIAGNOSIS — M5030 Other cervical disc degeneration, unspecified cervical region: Secondary | ICD-10-CM

## 2014-04-23 DIAGNOSIS — I10 Essential (primary) hypertension: Secondary | ICD-10-CM

## 2014-04-23 DIAGNOSIS — F419 Anxiety disorder, unspecified: Secondary | ICD-10-CM

## 2014-04-23 DIAGNOSIS — F329 Major depressive disorder, single episode, unspecified: Secondary | ICD-10-CM

## 2014-04-23 DIAGNOSIS — K219 Gastro-esophageal reflux disease without esophagitis: Secondary | ICD-10-CM

## 2014-04-23 LAB — POCT URINALYSIS DIPSTICK
BILIRUBIN UA: NEGATIVE
GLUCOSE UA: NEGATIVE
KETONES UA: NEGATIVE
LEUKOCYTES UA: NEGATIVE
NITRITE UA: NEGATIVE
Protein, UA: NEGATIVE
Spec Grav, UA: 1.015
Urobilinogen, UA: 0.2
pH, UA: 6.5

## 2014-04-23 MED ORDER — OMEPRAZOLE 40 MG PO CPDR: 40.0000 mg | DELAYED_RELEASE_CAPSULE | Freq: Every day | ORAL | Status: DC

## 2014-04-23 MED ORDER — METHOCARBAMOL 500 MG PO TABS: 500.0000 mg | ORAL_TABLET | Freq: Every day | ORAL | Status: DC

## 2014-04-23 MED ORDER — PRAVASTATIN SODIUM 40 MG PO TABS: 40.0000 mg | ORAL_TABLET | Freq: Every day | ORAL | Status: DC

## 2014-04-23 MED ORDER — CITALOPRAM HYDROBROMIDE 40 MG PO TABS: 40.0000 mg | ORAL_TABLET | Freq: Every day | ORAL | Status: DC

## 2014-04-23 MED ORDER — CLONAZEPAM 1 MG PO TABS: ORAL_TABLET | ORAL | Status: DC

## 2014-04-23 NOTE — Progress Notes (Signed)
Subjective:    Patient ID: Maria Sweeney, female    DOB: 02-Sep-1950, 63 y.o.   MRN: [id number]  04/23/2014  Medication Refill; Depression; Hyperlipidemia; Gastrophageal Reflux; and Hypertension   HPI This 63 y.o. female presents for six month follow-up:  1. HTN: management made at last visit includes starting Metoprolol ER 50mg  daily.  Checking blood pressure and elevated.  Did not start Metoprolol due to potential side effects.  Denies CP/palp/SOB/leg swelling.  Does notice that face gets very flushed if outside working in the yard.  2.  Hyperlipidemia: no changes to management made at last visit.  Patient reports good compliance with medication, good tolerance to medication, and good symptom control.  Denies HA/dizziness/focal weakness, paresthesias, focal weakness.    3.    Weight gain: worsening since last visit.  Drinks a lot of green tea;no sodas; does not drink sodas due to excessive gas.  Refuses to drink diet sodas.  Does not drink water.    4.  GERD: taking Omeprazole 40mg  daily; added Zantac qhs with improvement.  Denies n/v/d/c; denies bloody stools or melanotic stools.  No weight loss unintentional.  5.  Anxiety and depression:  Stable.  Admits to continued stressors with son and grandson.  Son underwent hip replacement recently.  Pt still struggling with caring for ex boyfriend who is an alcoholic. She is also involved in a relationship with a new female for the past year.   Patient reports good compliance with medication, good tolerance to medication, and good symptom control.    Taking Clonazepam as needed; needs refill.   6.  Urinary urgency/frequency:  +urgency; rare leakage now; has improved.  Has wet the bed sporadically.  Heavy sleeper.  7. Neck pain/DDD cervical spine: gets a headache every night.  Has worsened.    Radicular pain into LUE.  No weakness or n/t.  Intermittent n/t in LUE.  Plans to undergo consultation when receives Medicare in upcoming year.   Review of  Systems  Constitutional: Positive for unexpected weight change. Negative for fever, chills, diaphoresis and fatigue.  Eyes: Negative for visual disturbance.  Respiratory: Negative for cough and shortness of breath.   Cardiovascular: Negative for chest pain, palpitations and leg swelling.  Gastrointestinal: Negative for nausea, vomiting, abdominal pain, diarrhea and constipation.  Endocrine: Negative for cold intolerance, heat intolerance, polydipsia, polyphagia and polyuria.  Musculoskeletal: Positive for neck pain and neck stiffness.  Neurological: Positive for weakness and numbness. Negative for dizziness, tremors, seizures, syncope, facial asymmetry, speech difficulty, light-headedness and headaches.  Psychiatric/Behavioral: Positive for sleep disturbance and dysphoric mood. Negative for suicidal ideas and self-injury. The patient is nervous/anxious.     Past Medical History  Diagnosis Date  . Depression   . GERD (gastroesophageal reflux disease)   . Anxiety   . Hyperlipidemia   . Oral herpes   . Hypertension   . IBS (irritable bowel syndrome)     Constipation with diarrhea.   Past Surgical History  Procedure Laterality Date  . Breast biopsy Bilateral 2009    Calcium Deposit- 1980's and also in 2009/ Right side 1995  . Rotator cuff repair    . Debridement tennis elbow    . Appendectomy  1988  . Abdominal hysterectomy  1978    DUB; ovaries intact.  . Colonoscopy  05/11/2009    Normal.  Previous colonoscopy with polyps. [facility].     Allergies  Allergen Reactions  . Amoxicillin   . Augmentin [Amoxicillin-Pot Clavulanate]   . Codeine   .  Depakote [Divalproex Sodium]   . Latex   . Lipitor [Atorvastatin]   . Penicillins   . Septra [Sulfamethoxazole-Trimethoprim]   . Zocor [Simvastatin]    Current Outpatient Prescriptions  Medication Sig Dispense Refill  . citalopram (CELEXA) 40 MG tablet Take 1 tablet (40 mg total) by mouth daily. 30 tablet 5  . clonazePAM (KLONOPIN) 1 MG  tablet TAKE 1 TABLET TWICE A DAY AS NEEDED FOR ANXIETY 60 tablet 5  . omeprazole (PRILOSEC) 40 MG capsule Take 1 capsule (40 mg total) by mouth daily. 30 capsule 11  . pravastatin (PRAVACHOL) 40 MG tablet Take 1 tablet (40 mg total) by mouth daily. 30 tablet 5  . valACYclovir (VALTREX) 1000 MG tablet Take 1 tablet (1,000 mg total) by mouth 2 (two) times daily. 30 tablet 1  . methocarbamol (ROBAXIN) 500 MG tablet Take 1-2 tablets (500-1,000 mg total) by mouth at bedtime. 30 tablet 5  . metoprolol succinate (TOPROL-XL) 50 MG 24 hr tablet Take 1 tablet (50 mg total) by mouth daily. Start with 1/2 tablet daily for two weeks and then increase to 1 tablet daily.  Take with or immediately following a meal. (Patient not taking: Reported on 04/23/2014) 30 tablet 5  . oxybutynin (DITROPAN) 5 MG tablet Take 1 tablet (5 mg total) by mouth 3 (three) times daily. (Patient not taking: Reported on 04/23/2014) 90 tablet 5   No current facility-administered medications for this visit.       Objective:    BP 159/84 mmHg  Pulse 70  Temp(Src) 98.4 F (36.9 C) (Oral)  Resp 16  Ht 5' 0.5" (1.537 m)  Wt 130 lb (58.968 kg)  BMI 24.96 kg/m2  SpO2 98% Physical Exam  Constitutional: She is oriented to person, place, and time. She appears well-developed and well-nourished. No distress.  HENT:  Head: Normocephalic and atraumatic.  Right Ear: External ear normal.  Left Ear: External ear normal.  Nose: Nose normal.  Mouth/Throat: Oropharynx is clear and moist.  Eyes: Conjunctivae and EOM are normal. Pupils are equal, round, and reactive to light.  Neck: Neck supple. Muscular tenderness present. No spinous process tenderness present. Carotid bruit is not present. No rigidity. Decreased range of motion present. No erythema present. No thyromegaly present.  Cardiovascular: Normal rate, regular rhythm, normal heart sounds and intact distal pulses.  Exam reveals no gallop and no friction rub.   No murmur  heard. Pulmonary/Chest: Effort normal and breath sounds normal. She has no wheezes. She has no rales.  Abdominal: Soft. Bowel sounds are normal. She exhibits no distension and no mass. There is no tenderness. There is no rebound and no guarding.  Musculoskeletal:       Cervical back: She exhibits decreased range of motion, tenderness, bony tenderness, pain and spasm. She exhibits normal pulse.  Lymphadenopathy:    She has no cervical adenopathy.  Neurological: She is alert and oriented to person, place, and time. No cranial nerve deficit.  Skin: Skin is warm and dry. No rash noted. She is not diaphoretic. No erythema. No pallor.  Psychiatric: She has a normal mood and affect. Her behavior is normal.   Results for orders placed or performed in visit on 04/23/14  Comprehensive metabolic panel  Result Value Ref Range   Sodium 144 135 - 145 mEq/L   Potassium 4.1 3.5 - 5.3 mEq/L   Chloride 106 96 - 112 mEq/L   CO2 26 19 - 32 mEq/L   Glucose, Bld 93 70 - 99 mg/dL   BUN  7 6 - 23 mg/dL   Creat 0.71 0.50 - 1.10 mg/dL   Total Bilirubin 0.8 0.2 - 1.2 mg/dL   Alkaline Phosphatase 75 39 - 117 U/L   AST 19 0 - 37 U/L   ALT 13 0 - 35 U/L   Total Protein 6.9 6.0 - 8.3 g/dL   Albumin 4.5 3.5 - 5.2 g/dL   Calcium 9.5 8.4 - 10.5 mg/dL  Lipid panel  Result Value Ref Range   Cholesterol 183 0 - 200 mg/dL   Triglycerides 166 (H) <150 mg/dL   HDL 53 >39 mg/dL   Total CHOL/HDL Ratio 3.5 Ratio   VLDL 33 0 - 40 mg/dL   LDL Cholesterol 97 0 - 99 mg/dL  TSH  Result Value Ref Range   TSH 1.252 0.350 - 4.500 uIU/mL  POCT urinalysis dipstick  Result Value Ref Range   Color, UA Yellow    Clarity, UA Clear    Glucose, UA Negative    Bilirubin, UA Negative    Ketones, UA Negative    Spec Grav, UA 1.015    Blood, UA Trace-lysed    pH, UA 6.5    Protein, UA Negative    Urobilinogen, UA 0.2    Nitrite, UA Negative    Leukocytes, UA Negative        Assessment & Plan:   1. Pure  hypercholesterolemia   2. Essential hypertension, benign   3. Gastroesophageal reflux disease without esophagitis   4. Anxiety and depression   5. Degenerative disc disease, cervical     1. Hypercholesterolemia: controlled moderately; obtain labs; refill provided. 2.  HTN: uncontrolled due to non-compliance with Metoprolol; pt now agreeable to start medication.  Obtain TSH. 3.  GERD: controlled with dail Omeprazole 40mg  daily and Zantac 150mg  qhs. 4.  Anxiety and depression: moderately controlled; no change to management; refill of Celexa and Clonazepam provided. 5.  DDD cervical spine with worsening pain and radicular symptoms: worsening; rx for Robaxin provided to take qhs.   Meds ordered this encounter  Medications  . citalopram (CELEXA) 40 MG tablet    Sig: Take 1 tablet (40 mg total) by mouth daily.    Dispense:  30 tablet    Refill:  5  . pravastatin (PRAVACHOL) 40 MG tablet    Sig: Take 1 tablet (40 mg total) by mouth daily.    Dispense:  30 tablet    Refill:  5  . omeprazole (PRILOSEC) 40 MG capsule    Sig: Take 1 capsule (40 mg total) by mouth daily.    Dispense:  30 capsule    Refill:  11  . clonazePAM (KLONOPIN) 1 MG tablet    Sig: TAKE 1 TABLET TWICE A DAY AS NEEDED FOR ANXIETY    Dispense:  60 tablet    Refill:  5  . methocarbamol (ROBAXIN) 500 MG tablet    Sig: Take 1-2 tablets (500-1,000 mg total) by mouth at bedtime.    Dispense:  30 tablet    Refill:  5    Return in about 6 months (around 10/23/2014) for recheck.    Reginia Forts, M.D.  [facility] [address] [place], [place]  [address] [phone] phone [phone] fax

## 2014-04-24 DIAGNOSIS — M5030 Other cervical disc degeneration, unspecified cervical region: Secondary | ICD-10-CM | POA: Insufficient documentation

## 2014-04-24 LAB — LIPID PANEL
Cholesterol: 183 mg/dL (ref 0–200)
HDL: 53 mg/dL (ref >39)
LDL Cholesterol: 97 mg/dL (ref 0–99)
Total CHOL/HDL Ratio: 3.5 Ratio
Triglycerides: 166 mg/dL — ABNORMAL HIGH (ref <150)
VLDL: 33 mg/dL (ref 0–40)

## 2014-04-24 LAB — COMPREHENSIVE METABOLIC PANEL
ALBUMIN: 4.5 g/dL (ref 3.5–5.2)
ALT: 13 U/L (ref 0–35)
AST: 19 U/L (ref 0–37)
Alkaline Phosphatase: 75 U/L (ref 39–117)
BUN: 7 mg/dL (ref 6–23)
CALCIUM: 9.5 mg/dL (ref 8.4–10.5)
CO2: 26 mEq/L (ref 19–32)
Chloride: 106 mEq/L (ref 96–112)
Creat: 0.71 mg/dL (ref 0.50–1.10)
Glucose, Bld: 93 mg/dL (ref 70–99)
POTASSIUM: 4.1 meq/L (ref 3.5–5.3)
Sodium: 144 mEq/L (ref 135–145)
TOTAL PROTEIN: 6.9 g/dL (ref 6.0–8.3)
Total Bilirubin: 0.8 mg/dL (ref 0.2–1.2)

## 2014-04-24 LAB — TSH: TSH: 1.252 u[IU]/mL (ref 0.350–4.500)

## 2014-09-22 ENCOUNTER — Telehealth: Payer: Self-pay | Admitting: Family Medicine

## 2014-09-22 NOTE — Telephone Encounter (Signed)
Phone rany called three times no answer voice mail box full

## 2014-10-22 ENCOUNTER — Encounter: Payer: Self-pay | Admitting: *Deleted

## 2014-10-24 ENCOUNTER — Ambulatory Visit: Payer: Self-pay | Admitting: Family Medicine

## 2014-11-13 ENCOUNTER — Other Ambulatory Visit: Payer: Self-pay | Admitting: Family Medicine

## 2014-11-30 ENCOUNTER — Encounter: Payer: Self-pay | Admitting: Family Medicine

## 2014-11-30 DIAGNOSIS — E7800 Pure hypercholesterolemia, unspecified: Secondary | ICD-10-CM | POA: Insufficient documentation

## 2014-11-30 NOTE — Progress Notes (Signed)
This encounter was created in error - please disregard.

## 2014-11-30 NOTE — Progress Notes (Deleted)
Subjective:    Patient ID: Maria Sweeney, female    DOB: [birth year], 64 y.o.   MRN: [id number]  11/30/2014  No chief complaint on file.   HPI  Review of Systems  Constitutional: Negative for fever, chills, diaphoresis and fatigue.  Eyes: Negative for visual disturbance.  Respiratory: Negative for cough and shortness of breath.   Cardiovascular: Negative for chest pain, palpitations and leg swelling.  Gastrointestinal: Negative for nausea, vomiting, abdominal pain, diarrhea and constipation.  Endocrine: Negative for cold intolerance, heat intolerance, polydipsia, polyphagia and polyuria.  Neurological: Negative for dizziness, tremors, seizures, syncope, facial asymmetry, speech difficulty, weakness, light-headedness, numbness and headaches.    Past Medical History  Diagnosis Date  . Depression   . GERD (gastroesophageal reflux disease)   . Anxiety   . Hyperlipidemia   . Oral herpes   . Hypertension   . IBS (irritable bowel syndrome)     Constipation with diarrhea.   Past Surgical History  Procedure Laterality Date  . Breast biopsy Bilateral 2009    Calcium Deposit- 1980's and also in 2009/ Right side 1995  . Rotator cuff repair    . Debridement tennis elbow    . Appendectomy  1988  . Abdominal hysterectomy  1978    DUB; ovaries intact.  . Colonoscopy  05/11/2009    Normal.  Previous colonoscopy with polyps. [facility].     Allergies  Allergen Reactions  . Amoxicillin   . Augmentin [Amoxicillin-Pot Clavulanate]   . Codeine   . Depakote [Divalproex Sodium]   . Latex   . Lipitor [Atorvastatin]   . Penicillins   . Septra [Sulfamethoxazole-Trimethoprim]   . Zocor [Simvastatin]    Current Outpatient Prescriptions  Medication Sig Dispense Refill  . citalopram (CELEXA) 40 MG tablet Take 1 tablet (40 mg total) by mouth daily. 30 tablet 5  . clonazePAM (KLONOPIN) 1 MG tablet TAKE 1 TABLET TWICE A DAY AS NEEDED FOR ANXIETY 60 tablet 5  . methocarbamol (ROBAXIN) 500 MG  tablet TAKE ONE OR TWO TABLETS AT BEDTIME 30 tablet 0  . metoprolol succinate (TOPROL-XL) 50 MG 24 hr tablet TAKE 1/2 TABLET DAILY FOR TWO WEEKS, THEN TAKE ONE TABLET DAILY. 30 tablet 0  . omeprazole (PRILOSEC) 40 MG capsule Take 1 capsule (40 mg total) by mouth daily. 30 capsule 11  . oxybutynin (DITROPAN) 5 MG tablet Take 1 tablet (5 mg total) by mouth 3 (three) times daily. (Patient not taking: Reported on 04/23/2014) 90 tablet 5  . pravastatin (PRAVACHOL) 40 MG tablet Take 1 tablet (40 mg total) by mouth daily. 30 tablet 5  . valACYclovir (VALTREX) 1000 MG tablet Take 1 tablet (1,000 mg total) by mouth 2 (two) times daily. 30 tablet 1   No current facility-administered medications for this visit.       Objective:    There were no vitals taken for this visit. Physical Exam  Constitutional: She is oriented to person, place, and time. She appears well-developed and well-nourished. No distress.  HENT:  Head: Normocephalic and atraumatic.  Right Ear: External ear normal.  Left Ear: External ear normal.  Nose: Nose normal.  Mouth/Throat: Oropharynx is clear and moist.  Eyes: Conjunctivae and EOM are normal. Pupils are equal, round, and reactive to light.  Neck: Normal range of motion and full passive range of motion without pain. Neck supple. No JVD present. Carotid bruit is not present. No thyromegaly present.  Cardiovascular: Normal rate, regular rhythm and normal heart sounds.  Exam reveals no gallop  and no friction rub.   No murmur heard. Pulmonary/Chest: Effort normal and breath sounds normal. She has no wheezes. She has no rales.  Abdominal: Soft. Bowel sounds are normal. She exhibits no distension and no mass. There is no tenderness. There is no rebound and no guarding.  Musculoskeletal:       Right shoulder: Normal.       Left shoulder: Normal.       Cervical back: Normal.  Lymphadenopathy:    She has no cervical adenopathy.  Neurological: She is alert and oriented to person,  place, and time. She has normal reflexes. No cranial nerve deficit. She exhibits normal muscle tone. Coordination normal.  Skin: Skin is warm and dry. No rash noted. She is not diaphoretic. No erythema. No pallor.  Psychiatric: She has a normal mood and affect. Her behavior is normal. Judgment and thought content normal.  Nursing note and vitals reviewed.  Results for orders placed or performed in visit on 04/23/14  Comprehensive metabolic panel  Result Value Ref Range   Sodium 144 135 - 145 mEq/L   Potassium 4.1 3.5 - 5.3 mEq/L   Chloride 106 96 - 112 mEq/L   CO2 26 19 - 32 mEq/L   Glucose, Bld 93 70 - 99 mg/dL   BUN 7 6 - 23 mg/dL   Creat 0.71 0.50 - 1.10 mg/dL   Total Bilirubin 0.8 0.2 - 1.2 mg/dL   Alkaline Phosphatase 75 39 - 117 U/L   AST 19 0 - 37 U/L   ALT 13 0 - 35 U/L   Total Protein 6.9 6.0 - 8.3 g/dL   Albumin 4.5 3.5 - 5.2 g/dL   Calcium 9.5 8.4 - 10.5 mg/dL  Lipid panel  Result Value Ref Range   Cholesterol 183 0 - 200 mg/dL   Triglycerides 166 (H) <150 mg/dL   HDL 53 >39 mg/dL   Total CHOL/HDL Ratio 3.5 Ratio   VLDL 33 0 - 40 mg/dL   LDL Cholesterol 97 0 - 99 mg/dL  TSH  Result Value Ref Range   TSH 1.252 0.350 - 4.500 uIU/mL  POCT urinalysis dipstick  Result Value Ref Range   Color, UA Yellow    Clarity, UA Clear    Glucose, UA Negative    Bilirubin, UA Negative    Ketones, UA Negative    Spec Grav, UA 1.015    Blood, UA Trace-lysed    pH, UA 6.5    Protein, UA Negative    Urobilinogen, UA 0.2    Nitrite, UA Negative    Leukocytes, UA Negative        Assessment & Plan:   1. Insomnia   2. Gastroesophageal reflux disease without esophagitis   3. Essential hypertension, benign   4. Degenerative disc disease, cervical   5. Anxiety and depression   6. Pure hypercholesterolemia     No orders of the defined types were placed in this encounter.    No Follow-up on file.     Akeylah Hendel Elayne Guerin, M.D. [facility] [address] [place], [place]  [address] [phone] phone [phone] fax

## 2014-12-18 ENCOUNTER — Other Ambulatory Visit: Payer: Self-pay | Admitting: Family Medicine

## 2014-12-20 ENCOUNTER — Other Ambulatory Visit: Payer: Self-pay

## 2014-12-20 MED ORDER — METHOCARBAMOL 500 MG PO TABS: ORAL_TABLET | ORAL | Status: DC

## 2014-12-20 MED ORDER — METOPROLOL SUCCINATE ER 50 MG PO TB24
ORAL_TABLET | ORAL | Status: DC
Start: 2014-12-20 — End: 2015-01-18

## 2015-01-18 ENCOUNTER — Other Ambulatory Visit: Payer: Self-pay | Admitting: Physician Assistant

## 2015-01-18 ENCOUNTER — Encounter: Payer: Self-pay | Admitting: Family Medicine

## 2015-01-18 ENCOUNTER — Other Ambulatory Visit: Payer: Self-pay | Admitting: Family Medicine

## 2015-01-18 ENCOUNTER — Other Ambulatory Visit: Payer: Self-pay | Admitting: *Deleted

## 2015-01-18 ENCOUNTER — Ambulatory Visit (INDEPENDENT_AMBULATORY_CARE_PROVIDER_SITE_OTHER): Payer: Self-pay | Admitting: Family Medicine

## 2015-01-18 VITALS — BP 162/80 | HR 56 | Temp 98.4°F | Resp 16 | Wt 137.2 lb

## 2015-01-18 DIAGNOSIS — K219 Gastro-esophageal reflux disease without esophagitis: Secondary | ICD-10-CM

## 2015-01-18 DIAGNOSIS — M5030 Other cervical disc degeneration, unspecified cervical region: Secondary | ICD-10-CM

## 2015-01-18 DIAGNOSIS — E7800 Pure hypercholesterolemia, unspecified: Secondary | ICD-10-CM

## 2015-01-18 DIAGNOSIS — F329 Major depressive disorder, single episode, unspecified: Secondary | ICD-10-CM

## 2015-01-18 DIAGNOSIS — R0602 Shortness of breath: Secondary | ICD-10-CM

## 2015-01-18 DIAGNOSIS — M7989 Other specified soft tissue disorders: Secondary | ICD-10-CM

## 2015-01-18 DIAGNOSIS — F418 Other specified anxiety disorders: Secondary | ICD-10-CM

## 2015-01-18 DIAGNOSIS — I10 Essential (primary) hypertension: Secondary | ICD-10-CM

## 2015-01-18 DIAGNOSIS — F419 Anxiety disorder, unspecified: Secondary | ICD-10-CM

## 2015-01-18 DIAGNOSIS — G4700 Insomnia, unspecified: Secondary | ICD-10-CM

## 2015-01-18 LAB — POCT URINALYSIS DIPSTICK
Bilirubin, UA: NEGATIVE
Glucose, UA: NEGATIVE
Ketones, UA: NEGATIVE
LEUKOCYTES UA: NEGATIVE
NITRITE UA: NEGATIVE
PROTEIN UA: NEGATIVE
Spec Grav, UA: 1.015
Urobilinogen, UA: 0.2
pH, UA: 6

## 2015-01-18 LAB — CBC WITH DIFFERENTIAL/PLATELET
BASOS ABS: 0 10*3/uL (ref 0.0–0.1)
Basophils Relative: 0 % (ref 0–1)
EOS PCT: 3 % (ref 0–5)
Eosinophils Absolute: 0.2 10*3/uL (ref 0.0–0.7)
HCT: 36.2 % (ref 36.0–46.0)
Hemoglobin: 12.8 g/dL (ref 12.0–15.0)
LYMPHS PCT: 23 % (ref 12–46)
Lymphs Abs: 1.4 10*3/uL (ref 0.7–4.0)
MCH: 31.3 pg (ref 26.0–34.0)
MCHC: 35.4 g/dL (ref 30.0–36.0)
MCV: 88.5 fL (ref 78.0–100.0)
MPV: 10.3 fL (ref 8.6–12.4)
Monocytes Absolute: 0.4 10*3/uL (ref 0.1–1.0)
Monocytes Relative: 7 % (ref 3–12)
Neutro Abs: 4.2 10*3/uL (ref 1.7–7.7)
Neutrophils Relative %: 67 % (ref 43–77)
PLATELETS: 243 10*3/uL (ref 150–400)
RBC: 4.09 MIL/uL (ref 3.87–5.11)
RDW: 13.6 % (ref 11.5–15.5)
WBC: 6.3 10*3/uL (ref 4.0–10.5)

## 2015-01-18 MED ORDER — VALACYCLOVIR HCL 1 G PO TABS
1000.0000 mg | ORAL_TABLET | Freq: Two times a day (BID) | ORAL | Status: DC
Start: 2015-01-18 — End: 2016-02-11

## 2015-01-18 MED ORDER — OXYBUTYNIN CHLORIDE 5 MG PO TABS: 5.0000 mg | ORAL_TABLET | Freq: Three times a day (TID) | ORAL | Status: DC

## 2015-01-18 MED ORDER — METOPROLOL SUCCINATE ER 50 MG PO TB24
ORAL_TABLET | ORAL | Status: DC
Start: 2015-01-18 — End: 2015-04-26

## 2015-01-18 MED ORDER — ALBUTEROL SULFATE HFA 108 (90 BASE) MCG/ACT IN AERS: 2.0000 | INHALATION_SPRAY | RESPIRATORY_TRACT | Status: DC | PRN

## 2015-01-18 MED ORDER — PRAVASTATIN SODIUM 40 MG PO TABS: 40.0000 mg | ORAL_TABLET | Freq: Every day | ORAL | Status: DC

## 2015-01-18 MED ORDER — SPIRONOLACTONE 25 MG PO TABS: 25.0000 mg | ORAL_TABLET | Freq: Every day | ORAL | Status: DC

## 2015-01-18 MED ORDER — CLONAZEPAM 1 MG PO TABS: ORAL_TABLET | ORAL | Status: DC

## 2015-01-18 MED ORDER — CITALOPRAM HYDROBROMIDE 40 MG PO TABS: 40.0000 mg | ORAL_TABLET | Freq: Every day | ORAL | Status: DC

## 2015-01-18 NOTE — Progress Notes (Signed)
Subjective:    Patient ID: Maria Sweeney, female    DOB: 13-Jun-1950, 64 y.o.   MRN: [id number]  01/18/2015  Medication Refill; bloodwork; Foot Swelling; pressure and wheezing in chest; Depression; Hyperlipidemia; and Hypertension   HPI This 64 y.o. female presents for nine month follow-up:  1.  SOB:  Onset for several months but has worsened for the past two months.  With walking in [place], gets really winded.  In upper airway, things feel tight with breathing out.  Some days at work, gets tight with breathing.  Yesterday was a bad day; today is not too bad. Has a little bit of a cough. Wheezing at nighttime.  No energy.  Does nothing.  Can occur at rest or with exertion.  With lifting or do anything with walking, get short of breath.  History of childhood asthma.  2.  Hyperlipidemia: Patient reports good compliance with medication, good tolerance to medication, and good symptom control.    3.  HTN: has been suffering with leg swelling.  Hand swelling.  Onset of swelling intermittent for the past year.  Retaining fluid.  Urination varies.  Not eating right but trying to eat better.  No snacks.  Patient reports good compliance with medication, good tolerance to medication, and good symptom control.     4. Bloated: gets miserabel in abdomen.  Right now, not really tight but abdomen will distend.  Cannot eat anyhing; excessive flatulance.  Stomach roles.  Has gained a lot of weight.  +nausea intermittently.  No vomiting.  Chronic diarrheal related to IBS.  Stools are not solid but are pasty.  Some constipation.  Can have 2-3 bowel movements per day.  Colonoscopy x 2; last one five years ago [place].  Excessive heartburn.  Taking Prilosec every morning. Still taking Zantac qhs.    5. Hypersomnolence: sleeping a lot for the past year.  Some days will stay in bed until 4:00pm if does not need to work.  Has always been a night owl.  Never been able to sleep well.     Review of Systems    Constitutional: Negative for fever, chills, diaphoresis and fatigue.  HENT: Negative for congestion, ear pain, postnasal drip, rhinorrhea, sinus pressure, sore throat, trouble swallowing and voice change.   Eyes: Negative for visual disturbance.  Respiratory: Positive for shortness of breath and wheezing. Negative for cough.   Cardiovascular: Positive for leg swelling. Negative for chest pain and palpitations.  Gastrointestinal: Positive for constipation and abdominal distention. Negative for nausea, vomiting, abdominal pain, diarrhea, blood in stool, anal bleeding and rectal pain.  Endocrine: Negative for cold intolerance, heat intolerance, polydipsia, polyphagia and polyuria.  Neurological: Negative for dizziness, tremors, seizures, syncope, facial asymmetry, speech difficulty, weakness, light-headedness, numbness and headaches.  Psychiatric/Behavioral: Positive for dysphoric mood. Negative for suicidal ideas, sleep disturbance and self-injury. The patient is nervous/anxious.     Past Medical History  Diagnosis Date  . Depression   . GERD (gastroesophageal reflux disease)   . Anxiety   . Hyperlipidemia   . Oral herpes   . Hypertension   . IBS (irritable bowel syndrome)     Constipation with diarrhea.   Past Surgical History  Procedure Laterality Date  . Breast biopsy Bilateral 2009    Calcium Deposit- 1980's and also in 2009/ Right side 1995  . Rotator cuff repair    . Debridement tennis elbow    . Appendectomy  1988  . Abdominal hysterectomy  1978    DUB;  ovaries intact.  . Colonoscopy  05/11/2009    Normal.  Previous colonoscopy with polyps. [facility].     Allergies  Allergen Reactions  . Amoxicillin   . Augmentin [Amoxicillin-Pot Clavulanate]   . Codeine   . Depakote [Divalproex Sodium]   . Latex   . Lipitor [Atorvastatin]   . Penicillins   . Septra [Sulfamethoxazole-Trimethoprim]   . Zocor [Simvastatin]     Social History   Social History  . Marital Status:  Divorced    Spouse Name: N/A  . Number of Children: 2  . Years of Education: N/A   Occupational History  . 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   Social History Main Topics  . Smoking status: Never Smoker   . Smokeless tobacco: Never Used  . Alcohol Use: 0.6 oz/week    1 Glasses of wine per week     Comment: rare alcohol use; once per year on average.  . Drug Use: No  . Sexual Activity:    Partners: Male    Birth Control/ Protection: Surgical   Other Topics Concern  . Not on file   Social History Narrative   Marital status: divorced since 1999; dating.      Children: one son, one daughter; 2 grandchildren.      Lives: alone     Employment:  part-time jobs at  [employer].      Tobacco: none      Alcohol: rare      Drugs: none      Exercise:  sporadic   Family History  Problem Relation Age of Onset  . Hyperlipidemia Mother   . Hypertension Mother   . Cancer Mother     lung  . Diabetes Mother   . Glaucoma Mother   . Stroke Mother 28  . Heart disease Mother   . Asthma Father   . Emphysema Father   . COPD Father   . Glaucoma Father   . Hypertension Sister        Objective:    BP 162/80 mmHg  Pulse 56  Temp(Src) 98.4 F (36.9 C) (Oral)  Resp 16  Wt 137 lb 3.2 oz (62.234 kg)  SpO2 98% Physical Exam  Constitutional: She is oriented to person, place, and time. She appears well-developed and well-nourished. No distress.  HENT:  Head: Normocephalic and atraumatic.  Right Ear: External ear normal.  Left Ear: External ear normal.  Nose: Nose normal.  Mouth/Throat: Oropharynx is clear and moist.  Eyes: Conjunctivae and EOM are normal. Pupils are equal, round, and reactive to light.  Neck: Normal range of motion. Neck supple. Carotid bruit is not present. No thyromegaly present.  Cardiovascular: Normal rate, regular rhythm, normal heart sounds and intact distal pulses.  Exam reveals no gallop and no friction rub.   No murmur  heard. Trace to 1+ pitting edema BLE to ankles.  Pulmonary/Chest: Effort normal and breath sounds normal. No respiratory distress. She has no wheezes. She has no rales.  Abdominal: Soft. Bowel sounds are normal. She exhibits no distension and no mass. There is no tenderness. There is no rebound and no guarding.  Musculoskeletal: She exhibits edema.  Lymphadenopathy:    She has no cervical adenopathy.  Neurological: She is alert and oriented to person, place, and time. No cranial nerve deficit.  Skin: Skin is warm and dry. No rash noted. She is not diaphoretic. No erythema. No pallor.  Psychiatric: She has a normal mood and  affect. Her behavior is normal.   EKG: sinus bradycardia rate 51; no ST changes.      Assessment & Plan:   1. Pure hypercholesterolemia   2. Insomnia   3. Gastroesophageal reflux disease without esophagitis   4. Essential hypertension, benign   5. Anxiety and depression   6. Degenerative disc disease, cervical   7. Swelling of limb   8. Shortness of breath     1. Hypercholesterolemia: controlled; obtain labs; refill provided. 2.  Insomnia; uncontrolled yet hypersomnolent during the day.  3. GERD: moderately controlled; switch Omeprazole to OTC Nexium; continue Zantac at bedtime. 4. HTN: uncontrolled; continue Metoprolol ER 50mg  daily for now; will need to decrease dose at next visit due to bradycardia. ADD Spironolactone (Sulfa allergy so avoided HCTZ) 25mg  daily for swelling. 5.  Anxiety and depression: stable yet suffering with hypersomnolence during the day; at next visit when obtains Medicare, switch to Prozac. 6.  DDD cervical: stable; refill of Robaxin provided. 7.  Swelling of limbs b lower extremities: New.  Obtain labs; rx for Spironolactone provided.  Decrease salt intake. 8.  SOB: New.  Associated with wheezing; stable; EKG: sample of Dulera provided to use bid for the next month until Medicare takes affect.  Obtain labs.  Obtain CXR at next visit.  Close follow-up.   Orders Placed This Encounter  Procedures  . CBC with Differential/Platelet  . Comprehensive metabolic panel    Order Specific Question:  Has the patient fasted?    Answer:  Yes  . Lipid panel    Order Specific Question:  Has the patient fasted?    Answer:  Yes  . POCT urinalysis dipstick  . EKG 12-Lead   Meds ordered this encounter  Medications  . spironolactone (ALDACTONE) 25 MG tablet    Sig: Take 1 tablet (25 mg total) by mouth daily.    Dispense:  30 tablet    Refill:  3  . albuterol (PROVENTIL HFA;VENTOLIN HFA) 108 (90 BASE) MCG/ACT inhaler    Sig: Inhale 2 puffs into the lungs every 4 (four) hours as needed for wheezing or shortness of breath (cough, shortness of breath or wheezing.).    Dispense:  1 Inhaler    Refill:  1  . citalopram (CELEXA) 40 MG tablet    Sig: Take 1 tablet (40 mg total) by mouth daily.    Dispense:  30 tablet    Refill:  5    PT USUALLY SEES DR. Reginia Forts. THANK YOU!  Marland Kitchen clonazePAM (KLONOPIN) 1 MG tablet    Sig: TAKE 1 TABLET TWICE A DAY AS NEEDED FOR ANXIETY    Dispense:  60 tablet    Refill:  5  . metoprolol succinate (TOPROL-XL) 50 MG 24 hr tablet    Sig: TAKE ONE TABLET DAILY.    Dispense:  30 tablet    Refill:  5  . oxybutynin (DITROPAN) 5 MG tablet    Sig: Take 1 tablet (5 mg total) by mouth 3 (three) times daily.    Dispense:  90 tablet    Refill:  5  . pravastatin (PRAVACHOL) 40 MG tablet    Sig: Take 1 tablet (40 mg total) by mouth daily.    Dispense:  30 tablet    Refill:  5  . valACYclovir (VALTREX) 1000 MG tablet    Sig: Take 1 tablet (1,000 mg total) by mouth 2 (two) times daily.    Dispense:  30 tablet    Refill:  1  Return in about 4 weeks (around 02/15/2015) for recheck.    Ashira Kirsten Elayne Guerin, M.D. [facility] [address] [place], [place]  [address] [phone] phone [phone] fax

## 2015-01-18 NOTE — Telephone Encounter (Signed)
Pt needs refills on Robaxin, metoprolol, clonazepam, prilosec, pravastatin, and oxybutin.   Some of these are in the refill request which I have sent to you and I have pended the others that were not in the pharmacy request (Prilosec and Oxybutin)

## 2015-01-18 NOTE — Patient Instructions (Signed)
1.  Recommend switching from Omeprazole to Nexium daily.  Continue Zantac at bedtime. 2. Start Spironolactone 25mg  one daily for swelling and blood pressure.  3.  Start taking Albuterol 2 puffs every six hours as needed for cough, shortness of breath, or wheezing. 4. Start taking generic Colace one tablet twice daily as a stool softener.

## 2015-01-19 LAB — COMPREHENSIVE METABOLIC PANEL
ALK PHOS: 79 U/L (ref 33–130)
ALT: 13 U/L (ref 6–29)
AST: 15 U/L (ref 10–35)
Albumin: 4.2 g/dL (ref 3.6–5.1)
BILIRUBIN TOTAL: 0.7 mg/dL (ref 0.2–1.2)
BUN: 11 mg/dL (ref 7–25)
CO2: 24 mmol/L (ref 20–31)
CREATININE: 0.68 mg/dL (ref 0.50–0.99)
Calcium: 9.3 mg/dL (ref 8.6–10.4)
Chloride: 109 mmol/L (ref 98–110)
GLUCOSE: 102 mg/dL — AB (ref 65–99)
Potassium: 3.8 mmol/L (ref 3.5–5.3)
SODIUM: 143 mmol/L (ref 135–146)
Total Protein: 6.4 g/dL (ref 6.1–8.1)

## 2015-01-19 LAB — LIPID PANEL
Cholesterol: 176 mg/dL (ref 125–200)
HDL: 54 mg/dL (ref >=46)
LDL Cholesterol: 88 mg/dL (ref <130)
Total CHOL/HDL Ratio: 3.3 Ratio (ref <=5.0)
Triglycerides: 171 mg/dL — ABNORMAL HIGH (ref <150)
VLDL: 34 mg/dL — ABNORMAL HIGH (ref <30)

## 2015-01-24 ENCOUNTER — Telehealth: Payer: Self-pay | Admitting: Family Medicine

## 2015-01-24 NOTE — Telephone Encounter (Signed)
Patient returned call about her lab results. Clinical staff not available so I gave patient the following message from Dr. Tamala Julian "Notes Recorded by Wardell Honour, MD on 01/22/2015 at 8:35 AMCall -- 1. Sugar is slightly elevated at 102. 2. Liver and kidney functions are normal. 3. Cholesterol is under good control. 4. No evidence of anemia. 5. Urine is normal. 6. EKG is normal."....... Patient understood these results, she was grateful. Please call her back if further follow up is needed.  [phone]

## 2015-02-22 ENCOUNTER — Ambulatory Visit (INDEPENDENT_AMBULATORY_CARE_PROVIDER_SITE_OTHER): Payer: Medicare Other | Admitting: Family Medicine

## 2015-02-22 ENCOUNTER — Encounter: Payer: Self-pay | Admitting: Family Medicine

## 2015-02-22 ENCOUNTER — Ambulatory Visit (INDEPENDENT_AMBULATORY_CARE_PROVIDER_SITE_OTHER): Payer: Medicare Other

## 2015-02-22 VITALS — BP 132/76 | HR 51 | Temp 98.8°F | Resp 16 | Ht 60.5 in | Wt 134.4 lb

## 2015-02-22 DIAGNOSIS — F329 Major depressive disorder, single episode, unspecified: Secondary | ICD-10-CM

## 2015-02-22 DIAGNOSIS — M7989 Other specified soft tissue disorders: Secondary | ICD-10-CM | POA: Diagnosis not present

## 2015-02-22 DIAGNOSIS — R0602 Shortness of breath: Secondary | ICD-10-CM

## 2015-02-22 DIAGNOSIS — E2839 Other primary ovarian failure: Secondary | ICD-10-CM | POA: Diagnosis not present

## 2015-02-22 DIAGNOSIS — Z1231 Encounter for screening mammogram for malignant neoplasm of breast: Secondary | ICD-10-CM | POA: Diagnosis not present

## 2015-02-22 DIAGNOSIS — R739 Hyperglycemia, unspecified: Secondary | ICD-10-CM | POA: Diagnosis not present

## 2015-02-22 DIAGNOSIS — R194 Change in bowel habit: Secondary | ICD-10-CM | POA: Diagnosis not present

## 2015-02-22 DIAGNOSIS — I10 Essential (primary) hypertension: Secondary | ICD-10-CM

## 2015-02-22 DIAGNOSIS — Z1159 Encounter for screening for other viral diseases: Secondary | ICD-10-CM | POA: Diagnosis not present

## 2015-02-22 DIAGNOSIS — Z23 Encounter for immunization: Secondary | ICD-10-CM

## 2015-02-22 DIAGNOSIS — F418 Other specified anxiety disorders: Secondary | ICD-10-CM

## 2015-02-22 DIAGNOSIS — R7309 Other abnormal glucose: Secondary | ICD-10-CM | POA: Diagnosis not present

## 2015-02-22 DIAGNOSIS — G4710 Hypersomnia, unspecified: Secondary | ICD-10-CM | POA: Diagnosis not present

## 2015-02-22 DIAGNOSIS — K219 Gastro-esophageal reflux disease without esophagitis: Secondary | ICD-10-CM

## 2015-02-22 DIAGNOSIS — G4700 Insomnia, unspecified: Secondary | ICD-10-CM

## 2015-02-22 DIAGNOSIS — Z114 Encounter for screening for human immunodeficiency virus [HIV]: Secondary | ICD-10-CM | POA: Diagnosis not present

## 2015-02-22 DIAGNOSIS — F419 Anxiety disorder, unspecified: Secondary | ICD-10-CM

## 2015-02-22 LAB — COMPREHENSIVE METABOLIC PANEL
ALBUMIN: 4.2 g/dL (ref 3.6–5.1)
ALK PHOS: 82 U/L (ref 33–130)
ALT: 15 U/L (ref 6–29)
AST: 20 U/L (ref 10–35)
BILIRUBIN TOTAL: 0.5 mg/dL (ref 0.2–1.2)
BUN: 11 mg/dL (ref 7–25)
CO2: 28 mmol/L (ref 20–31)
CREATININE: 0.74 mg/dL (ref 0.50–0.99)
Calcium: 9.4 mg/dL (ref 8.6–10.4)
Chloride: 106 mmol/L (ref 98–110)
Glucose, Bld: 83 mg/dL (ref 65–99)
Potassium: 4.4 mmol/L (ref 3.5–5.3)
SODIUM: 142 mmol/L (ref 135–146)
TOTAL PROTEIN: 6.7 g/dL (ref 6.1–8.1)

## 2015-02-22 LAB — HEMOGLOBIN A1C
HEMOGLOBIN A1C: 6 % — AB (ref <5.7)
MEAN PLASMA GLUCOSE: 126 mg/dL — AB (ref <117)

## 2015-02-22 MED ORDER — SPIRONOLACTONE 50 MG PO TABS: 50.0000 mg | ORAL_TABLET | Freq: Every day | ORAL | Status: DC

## 2015-02-22 MED ORDER — PANTOPRAZOLE SODIUM 40 MG PO TBEC: 40.0000 mg | DELAYED_RELEASE_TABLET | Freq: Every day | ORAL | Status: DC

## 2015-02-22 MED ORDER — FLUOXETINE HCL 20 MG PO TABS: 20.0000 mg | ORAL_TABLET | Freq: Every day | ORAL | Status: DC

## 2015-02-22 NOTE — Patient Instructions (Signed)
1. Wean Celexa to 1/2 tablet daily for one month then wean to 1/2 tablet every other day.   2. Start Fluoxetine 20mg  one tablet daily.

## 2015-02-22 NOTE — Progress Notes (Signed)
Subjective:    Patient ID: Maria Sweeney, female    DOB: [birth year], 64 y.o.   MRN: [id number]  02/22/2015  Follow-up   HPI This 64 y.o. female presents for six week follow-up:     1.  Hypercholesterolemia: controlled; obtain labs at last visit; refill provided.  2. Insomnia; uncontrolled yet hypersomnolent during the day.   3. GERD: moderately controlled; switch Omeprazole to OTC Nexium; continued Zantac at bedtime.  Heartburn is really bad now with switch to Nexium. Has suffered with vomiting with Nexium. Purchased Nexium 20mg  daily.  Still taking Zantac at bedtime. Woke up one morning at 4:30 to open work; vomited yellowish phlegm.    4. HTN: uncontrolled; continued Metoprolol ER 50mg  daily for now; will need to decrease dose at next visit due to bradycardia. ADDED Spironolactone (Sulfa allergy so avoided HCTZ) 25mg  daily for swelling.  Blood pressure improved today; weight down 3 pounds since last visit six weeks ago; not checking at home.  Has checked at drug store; has been a little elevated at drug store.    5. Anxiety and depression: stable yet suffering with hypersomnolence during the day; at next visit when obtains Medicare, switch to Prozac.  Ready to do this today.    6. DDD cervical: stable; refilled of Robaxin provided.  7. Swelling of limbs b lower extremities: New. Obtain labs; rx for Spironolactone provided. Decrease salt intake.  No addition of salt to food; has not decreased salt intake.  Leg swelling has resolved.  Took 3-4 days before saw improvement.     8. SOB: New at last visit. Associated with wheezing; stable; EKG: sample of Dulera provided to use bid for the next month until Medicare takes affect. Obtain labs. Obtain CXR at next visit. Close follow-up.  Much improved; no longer having SOB at work; no longer having wheezing at night.  75% improved.  9. Bowel changes: onset several months ago; last colonoscopy five years ago.  IBS diagnosed in  twenties.   Review of Systems  Constitutional: Negative for fever, chills, diaphoresis and fatigue.  Eyes: Negative for visual disturbance.  Respiratory: Negative for cough and shortness of breath.   Cardiovascular: Negative for chest pain, palpitations and leg swelling.  Gastrointestinal: Negative for nausea, vomiting, abdominal pain, diarrhea and constipation.  Endocrine: Negative for cold intolerance, heat intolerance, polydipsia, polyphagia and polyuria.  Neurological: Negative for dizziness, tremors, seizures, syncope, facial asymmetry, speech difficulty, weakness, light-headedness, numbness and headaches.  Psychiatric/Behavioral: Positive for sleep disturbance and dysphoric mood. Negative for suicidal ideas and self-injury. The patient is nervous/anxious.     Past Medical History  Diagnosis Date  . Depression   . GERD (gastroesophageal reflux disease)   . Anxiety   . Hyperlipidemia   . Oral herpes   . Hypertension   . IBS (irritable bowel syndrome)     Constipation with diarrhea.   Past Surgical History  Procedure Laterality Date  . Breast biopsy Bilateral 2009    Calcium Deposit- 1980's and also in 2009/ Right side 1995  . Rotator cuff repair    . Debridement tennis elbow    . Appendectomy  1988  . Abdominal hysterectomy  1978    DUB; ovaries intact.  . Colonoscopy  05/11/2009    Normal.  Previous colonoscopy with polyps. [facility].     Allergies  Allergen Reactions  . Amoxicillin   . Augmentin [Amoxicillin-Pot Clavulanate]   . Codeine   . Depakote [Divalproex Sodium]   . Latex   .  Lipitor [Atorvastatin]   . Penicillins   . Septra [Sulfamethoxazole-Trimethoprim]   . Zocor [Simvastatin]     Social History   Social History  . Marital Status: Divorced    Spouse Name: N/A  . Number of Children: 2  . Years of Education: N/A   Occupational History  . 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   Social History Main Topics  . Smoking status: Never  Smoker   . Smokeless tobacco: Never Used  . Alcohol Use: 0.6 oz/week    1 Glasses of wine per week     Comment: rare alcohol use; once per year on average.  . Drug Use: No  . Sexual Activity:    Partners: Male    Birth Control/ Protection: Surgical   Other Topics Concern  . Not on file   Social History Narrative   Marital status: divorced since 1999; dating.      Children: one son, one daughter; 2 grandchildren.      Lives: alone     Employment:  part-time jobs at  [employer].      Tobacco: none      Alcohol: rare      Drugs: none      Exercise:  sporadic   Family History  Problem Relation Age of Onset  . Hyperlipidemia Mother   . Hypertension Mother   . Cancer Mother     lung  . Diabetes Mother   . Glaucoma Mother   . Stroke Mother 26  . Heart disease Mother   . Asthma Father   . Emphysema Father   . COPD Father   . Glaucoma Father   . Hypertension Sister        Objective:    BP 132/76 mmHg  Pulse 51  Temp(Src) 98.8 F (37.1 C) (Oral)  Resp 16  Ht 5' 0.5" (1.537 m)  Wt 134 lb 6.4 oz (60.963 kg)  BMI 25.81 kg/m2 Physical Exam  Constitutional: She is oriented to person, place, and time. She appears well-developed and well-nourished. No distress.  HENT:  Head: Normocephalic and atraumatic.  Right Ear: External ear normal.  Left Ear: External ear normal.  Nose: Nose normal.  Mouth/Throat: Oropharynx is clear and moist.  Eyes: Conjunctivae and EOM are normal. Pupils are equal, round, and reactive to light.  Neck: Normal range of motion. Neck supple. Carotid bruit is not present. No thyromegaly present.  Cardiovascular: Normal rate, regular rhythm, normal heart sounds and intact distal pulses.  Exam reveals no gallop and no friction rub.   No murmur heard. Pulmonary/Chest: Effort normal and breath sounds normal. She has no wheezes. She has no rales.  Abdominal: Soft. Bowel sounds are normal. She exhibits no distension and no mass. There is no  tenderness. There is no rebound and no guarding.  Lymphadenopathy:    She has no cervical adenopathy.  Neurological: She is alert and oriented to person, place, and time. No cranial nerve deficit.  Skin: Skin is warm and dry. No rash noted. She is not diaphoretic. No erythema. No pallor.  Psychiatric: She has a normal mood and affect. Her behavior is normal.   [facility] reading (PRIMARY) by  Dr. Tamala Julian. CXR:  NAD     Assessment & Plan:   1. Essential hypertension, benign   2. Hyperglycemia   3. Shortness of breath   4. Bowel habit changes   5. Screening for HIV (human immunodeficiency virus)   6. Need for hepatitis  C screening test   7. Encounter for screening mammogram for breast cancer   8. Estrogen deficiency   9. Need for prophylactic vaccination and inoculation against influenza   10. Anxiety and depression   11. Gastroesophageal reflux disease without esophagitis    1. HTN: improved; increase Spironolactone to 50mg  daily. Obtain labs. 2.  Hyperglycemia: new. Obtain HgbA1c and repeat glucose. 3.  SOB: improved with Dulera sample; CXR obtained; normal lung exam today. 4.  Bowel habit changes: New.  Refer for colonoscopy and GI consultation; history of IBS. 5.  Screening HIV and hepatitis C: obtain per CDC guidelines. 6.  Breast cancer screening: Mammogram. 7.  Estrogen deficiency: DEXA scan. 8. S/p flu vaccine 9. Depression and anxiety: uncontrolled and suffering with fatigue/hypersomnolence; wean Celexa and start Prozac.   10. GERD: change to Protonix. 11. Hypersomnolence: persistent; wean Celexa; consider weaning Metroprolol if hypersomnolence and fatigue persists. 12. LE edema: improved with Spironolactone.   Orders Placed This Encounter  Procedures  . DG Chest 2 View    Standing Status: Future     Number of Occurrences: 1     Standing Expiration Date: 02/22/2016    Order Specific Question:  Reason for Exam (SYMPTOM  OR DIAGNOSIS REQUIRED)    Answer:  DOE/SOB; wheezing      Order Specific Question:  Preferred imaging location?    Answer:  External  . MM Digital Screening    Standing Status: Future     Number of Occurrences:      Standing Expiration Date: 04/23/2016    Order Specific Question:  Reason for Exam (SYMPTOM  OR DIAGNOSIS REQUIRED)    Answer:  annual screening    Order Specific Question:  Preferred imaging location?    Answer:  [facility]  . DG Bone Density    Standing Status: Future     Number of Occurrences:      Standing Expiration Date: 04/23/2016    Order Specific Question:  Reason for Exam (SYMPTOM  OR DIAGNOSIS REQUIRED)    Answer:  estrogen deficiency    Order Specific Question:  Preferred imaging location?    Answer:  [facility]  . Flu Vaccine QUAD 36+ mos IM  . Comprehensive metabolic panel  . Hemoglobin A1c  . Hepatitis C antibody  . Ambulatory referral to Gastroenterology    Referral Priority:  Routine    Referral Type:  Consultation    Referral Reason:  Specialty Services Required    Number of Visits Requested:  1   Meds ordered this encounter  Medications  . FLUoxetine (PROZAC) 20 MG tablet    Sig: Take 1 tablet (20 mg total) by mouth daily.    Dispense:  30 tablet    Refill:  3  . spironolactone (ALDACTONE) 50 MG tablet    Sig: Take 1 tablet (50 mg total) by mouth daily.    Dispense:  30 tablet    Refill:  5  . pantoprazole (PROTONIX) 40 MG tablet    Sig: Take 1 tablet (40 mg total) by mouth daily.    Dispense:  30 tablet    Refill:  11    Return in about 2 months (around 04/24/2015) for recheck high blood pressure. Norwood Levo, M.D. [facility] [address] [place], [place]  [address] [phone] phone [phone] fax

## 2015-02-23 LAB — HEPATITIS C ANTIBODY: HCV Ab: NEGATIVE

## 2015-02-25 ENCOUNTER — Other Ambulatory Visit: Payer: Self-pay

## 2015-02-25 DIAGNOSIS — Z1231 Encounter for screening mammogram for malignant neoplasm of breast: Secondary | ICD-10-CM

## 2015-03-23 ENCOUNTER — Other Ambulatory Visit: Payer: Self-pay | Admitting: Family Medicine

## 2015-03-28 ENCOUNTER — Encounter: Payer: Self-pay | Admitting: Family Medicine

## 2015-04-02 ENCOUNTER — Encounter: Payer: Self-pay | Admitting: Family Medicine

## 2015-04-23 DIAGNOSIS — H5712 Ocular pain, left eye: Secondary | ICD-10-CM | POA: Diagnosis not present

## 2015-04-26 ENCOUNTER — Encounter: Payer: Self-pay | Admitting: Family Medicine

## 2015-04-26 ENCOUNTER — Ambulatory Visit (INDEPENDENT_AMBULATORY_CARE_PROVIDER_SITE_OTHER): Payer: Medicare Other | Admitting: Family Medicine

## 2015-04-26 VITALS — BP 135/72 | HR 50 | Temp 98.2°F | Resp 16 | Ht 60.5 in | Wt 133.2 lb

## 2015-04-26 DIAGNOSIS — R7302 Impaired glucose tolerance (oral): Secondary | ICD-10-CM | POA: Diagnosis not present

## 2015-04-26 DIAGNOSIS — F418 Other specified anxiety disorders: Secondary | ICD-10-CM

## 2015-04-26 DIAGNOSIS — F419 Anxiety disorder, unspecified: Secondary | ICD-10-CM

## 2015-04-26 DIAGNOSIS — R001 Bradycardia, unspecified: Secondary | ICD-10-CM

## 2015-04-26 DIAGNOSIS — K219 Gastro-esophageal reflux disease without esophagitis: Secondary | ICD-10-CM | POA: Diagnosis not present

## 2015-04-26 DIAGNOSIS — Z23 Encounter for immunization: Secondary | ICD-10-CM

## 2015-04-26 DIAGNOSIS — I10 Essential (primary) hypertension: Secondary | ICD-10-CM

## 2015-04-26 DIAGNOSIS — F329 Major depressive disorder, single episode, unspecified: Secondary | ICD-10-CM

## 2015-04-26 LAB — COMPREHENSIVE METABOLIC PANEL
ALK PHOS: 80 U/L (ref 33–130)
ALT: 12 U/L (ref 6–29)
AST: 14 U/L (ref 10–35)
Albumin: 4.3 g/dL (ref 3.6–5.1)
BUN: 11 mg/dL (ref 7–25)
CO2: 27 mmol/L (ref 20–31)
Calcium: 9.5 mg/dL (ref 8.6–10.4)
Chloride: 105 mmol/L (ref 98–110)
Creat: 0.89 mg/dL (ref 0.50–0.99)
GLUCOSE: 96 mg/dL (ref 65–99)
POTASSIUM: 4.6 mmol/L (ref 3.5–5.3)
Sodium: 142 mmol/L (ref 135–146)
Total Bilirubin: 0.6 mg/dL (ref 0.2–1.2)
Total Protein: 6.6 g/dL (ref 6.1–8.1)

## 2015-04-26 MED ORDER — METOPROLOL SUCCINATE ER 25 MG PO TB24
ORAL_TABLET | ORAL | Status: DC
Start: 2015-04-26 — End: 2015-11-22

## 2015-04-26 MED ORDER — FLUOXETINE HCL 20 MG PO TABS: 40.0000 mg | ORAL_TABLET | Freq: Every day | ORAL | Status: DC

## 2015-04-26 NOTE — Progress Notes (Signed)
Subjective:    Patient ID: Maria Sweeney, female    DOB: 13-Jan-1951, 64 y.o.   MRN: [id number]  04/26/2015  Follow-up and Depression   HPI This 64 y.o. female presents for two month follow-up:  1. HTN: increased Spironolactone to 50mg  daily. Swelling has improved.  Not checking BP at home.  HR today is 50.    2. Bradycardia: heart rate never above 60.   3.  Bloating post-prandial: less bloating overall; does get bloated with a full meal.  REferred to Gi at last visit; has not scheduled yet.   4 Anxiety and depression: weaned [place]; taking Klonopin one daily most days; rarely takes a second.  Started Prozac 20mg  daily.  Has not noticed less fatigue.  Emotionally, has always suffered with excessive worry.  Lives alone; has two animals.  Does not talk on phone.  No bets friend except boyfriend.  Son is alcoholic and is drinking again.    5. Wheezing: restarted; having to use albuterol for past two weeks. Mild cough.   6.  Urinary urgency: did not start Ditropan; did not start it.     Review of Systems  Constitutional: Negative for fever, chills, diaphoresis and fatigue.  Eyes: Negative for visual disturbance.  Respiratory: Negative for cough and shortness of breath.   Cardiovascular: Negative for chest pain, palpitations and leg swelling.  Gastrointestinal: Negative for nausea, vomiting, abdominal pain, diarrhea and constipation.  Endocrine: Negative for cold intolerance, heat intolerance, polydipsia, polyphagia and polyuria.  Neurological: Negative for dizziness, tremors, seizures, syncope, facial asymmetry, speech difficulty, weakness, light-headedness, numbness and headaches.    Past Medical History  Diagnosis Date  . Depression   . GERD (gastroesophageal reflux disease)   . Anxiety   . Hyperlipidemia   . Oral herpes   . Hypertension   . IBS (irritable bowel syndrome)     Constipation with diarrhea.   Past Surgical History  Procedure Laterality Date  . Breast biopsy  Bilateral 2009    Calcium Deposit- 1980's and also in 2009/ Right side 1995  . Rotator cuff repair    . Debridement tennis elbow    . Appendectomy  1988  . Abdominal hysterectomy  1978    DUB; ovaries intact.  . Colonoscopy  05/11/2009    Normal.  Previous colonoscopy with polyps. [facility].     Allergies  Allergen Reactions  . Amoxicillin   . Augmentin [Amoxicillin-Pot Clavulanate]   . Codeine   . Depakote [Divalproex Sodium]   . Latex   . Lipitor [Atorvastatin]   . Penicillins   . Septra [Sulfamethoxazole-Trimethoprim]   . Zocor [Simvastatin]     Social History   Social History  . Marital Status: Divorced    Spouse Name: N/A  . Number of Children: 2  . Years of Education: N/A   Occupational History  . 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   Social History Main Topics  . Smoking status: Never Smoker   . Smokeless tobacco: Never Used  . Alcohol Use: 0.6 oz/week    1 Glasses of wine per week     Comment: rare alcohol use; once per year on average.  . Drug Use: No  . Sexual Activity:    Partners: Male    Birth Control/ Protection: Surgical   Other Topics Concern  . Not on file   Social History Narrative   Marital status: divorced since 1999; dating.      Children: one son, one daughter;  2 grandchildren.      Lives: alone     Employment:  part-time jobs at  [employer].      Tobacco: none      Alcohol: rare      Drugs: none      Exercise:  sporadic   Family History  Problem Relation Age of Onset  . Hyperlipidemia Mother   . Hypertension Mother   . Cancer Mother     lung  . Diabetes Mother   . Glaucoma Mother   . Stroke Mother 57  . Heart disease Mother   . Asthma Father   . Emphysema Father   . COPD Father   . Glaucoma Father   . Hypertension Sister        Objective:    BP 135/72 mmHg  Pulse 50  Temp(Src) 98.2 F (36.8 C) (Oral)  Resp 16  Ht 5' 0.5" (1.537 m)  Wt 133 lb 3.2 oz (60.419 kg)  BMI 25.58 kg/m2  SpO2  96% Physical Exam  Constitutional: She is oriented to person, place, and time. She appears well-developed and well-nourished. No distress.  HENT:  Head: Normocephalic and atraumatic.  Right Ear: External ear normal.  Left Ear: External ear normal.  Nose: Nose normal.  Mouth/Throat: Oropharynx is clear and moist.  Eyes: Conjunctivae and EOM are normal. Pupils are equal, round, and reactive to light.  Neck: Normal range of motion. Neck supple. Carotid bruit is not present. No thyromegaly present.  Cardiovascular: Normal rate, regular rhythm, normal heart sounds and intact distal pulses.  Exam reveals no gallop and no friction rub.   No murmur heard. Pulmonary/Chest: Effort normal and breath sounds normal. She has no wheezes. She has no rales.  Abdominal: Soft. Bowel sounds are normal. She exhibits no distension and no mass. There is no tenderness. There is no rebound and no guarding.  Lymphadenopathy:    She has no cervical adenopathy.  Neurological: She is alert and oriented to person, place, and time. No cranial nerve deficit.  Skin: Skin is warm and dry. No rash noted. She is not diaphoretic. No erythema. No pallor.  Psychiatric: She has a normal mood and affect. Her behavior is normal.        Assessment & Plan:   1. Essential hypertension, benign   2. Anxiety and depression   3. Glucose intolerance (impaired glucose tolerance)   4. Gastroesophageal reflux disease without esophagitis   5. Bradycardia     Orders Placed This Encounter  Procedures  . Pneumococcal conjugate vaccine 13-valent IM  . Comprehensive metabolic panel   Meds ordered this encounter  Medications  . metoprolol succinate (TOPROL-XL) 25 MG 24 hr tablet    Sig: TAKE ONE TABLET DAILY.    Dispense:  30 tablet    Refill:  5  . FLUoxetine (PROZAC) 20 MG tablet    Sig: Take 2 tablets (40 mg total) by mouth daily.    Dispense:  60 tablet    Refill:  5    Return in about 3 months (around  07/25/2015).    Ahmar Pickrell Elayne Guerin, M.D. [facility] [address] [place], [place]  [address] [phone] phone [phone] fax

## 2015-04-30 DIAGNOSIS — H40003 Preglaucoma, unspecified, bilateral: Secondary | ICD-10-CM | POA: Diagnosis not present

## 2015-05-16 ENCOUNTER — Other Ambulatory Visit: Payer: Self-pay | Admitting: Family Medicine

## 2015-05-27 DIAGNOSIS — H401132 Primary open-angle glaucoma, bilateral, moderate stage: Secondary | ICD-10-CM | POA: Diagnosis not present

## 2015-06-17 ENCOUNTER — Other Ambulatory Visit: Payer: Self-pay | Admitting: Family Medicine

## 2015-07-26 ENCOUNTER — Telehealth: Payer: Self-pay | Admitting: Family Medicine

## 2015-07-26 ENCOUNTER — Ambulatory Visit: Payer: Medicare Other | Admitting: Family Medicine

## 2015-07-26 DIAGNOSIS — E2839 Other primary ovarian failure: Secondary | ICD-10-CM

## 2015-07-26 NOTE — Telephone Encounter (Signed)
SPOKE WITH PATIENT AND SHE WILL CALL [facility] AND SCHEDULE HER MAMMOGRAM AND DEXA SCAN.  SHE NEEDS TO HAVE A PAP SMEAR.  SHE STATED THAT IT HAD BEEN YEARS SINCE SHE HAD ONE DONE.

## 2015-07-31 ENCOUNTER — Other Ambulatory Visit: Payer: Self-pay | Admitting: Family Medicine

## 2015-08-01 ENCOUNTER — Other Ambulatory Visit: Payer: Self-pay | Admitting: Family Medicine

## 2015-08-01 DIAGNOSIS — N63 Unspecified lump in unspecified breast: Secondary | ICD-10-CM

## 2015-08-01 DIAGNOSIS — R921 Mammographic calcification found on diagnostic imaging of breast: Secondary | ICD-10-CM

## 2015-08-02 NOTE — Telephone Encounter (Signed)
Please call in refill of Clonazepam as approved. 

## 2015-08-05 DIAGNOSIS — H401132 Primary open-angle glaucoma, bilateral, moderate stage: Secondary | ICD-10-CM | POA: Diagnosis not present

## 2015-08-05 NOTE — Telephone Encounter (Signed)
Faxed

## 2015-08-13 ENCOUNTER — Encounter: Payer: Medicare Other | Admitting: Family Medicine

## 2015-08-19 ENCOUNTER — Other Ambulatory Visit: Payer: Self-pay | Admitting: Family Medicine

## 2015-08-19 ENCOUNTER — Other Ambulatory Visit: Payer: Self-pay

## 2015-08-19 DIAGNOSIS — R921 Mammographic calcification found on diagnostic imaging of breast: Secondary | ICD-10-CM

## 2015-08-20 ENCOUNTER — Other Ambulatory Visit: Payer: Self-pay

## 2015-08-27 ENCOUNTER — Ambulatory Visit
Admission: RE | Admit: 2015-08-27 | Discharge: 2015-08-27 | Disposition: A | Discharge status: Home / Self Care | Payer: PPO | Source: Ambulatory Visit | Attending: Family Medicine | Admitting: Family Medicine

## 2015-08-27 DIAGNOSIS — R921 Mammographic calcification found on diagnostic imaging of breast: Secondary | ICD-10-CM

## 2015-09-06 ENCOUNTER — Ambulatory Visit
Admission: RE | Admit: 2015-09-06 | Discharge: 2015-09-06 | Disposition: A | Discharge status: Home / Self Care | Payer: PPO | Source: Ambulatory Visit | Attending: Family Medicine | Admitting: Family Medicine

## 2015-09-06 DIAGNOSIS — E2839 Other primary ovarian failure: Secondary | ICD-10-CM

## 2015-09-06 DIAGNOSIS — M85851 Other specified disorders of bone density and structure, right thigh: Secondary | ICD-10-CM | POA: Diagnosis not present

## 2015-09-06 DIAGNOSIS — Z780 Asymptomatic menopausal state: Secondary | ICD-10-CM | POA: Diagnosis not present

## 2015-09-09 NOTE — Progress Notes (Signed)
This encounter was created in error - please disregard.

## 2015-09-19 ENCOUNTER — Other Ambulatory Visit: Payer: Self-pay | Admitting: Family Medicine

## 2015-09-23 ENCOUNTER — Other Ambulatory Visit: Payer: Self-pay

## 2015-09-23 NOTE — Telephone Encounter (Signed)
I do not see where we have performed a neck xray on patient in the past two years.  Has she seen another provider/orthopedist for her neck pain lately?  Is she taking Robaxin/methocarbamol for neck pain?  If no, then recommend presenting to walk in clinic for evaluation of neck pain ongoing.  She needs imaging if not completed in the past year.

## 2015-09-23 NOTE — Telephone Encounter (Signed)
Dr Tamala Julian, pt has appt sch w/you in Oct. Do you want to [facility] RF of this until then?

## 2015-09-24 MED ORDER — METHOCARBAMOL 500 MG PO TABS: ORAL_TABLET | ORAL | Status: DC

## 2015-09-30 ENCOUNTER — Telehealth: Payer: Self-pay

## 2015-09-30 NOTE — Telephone Encounter (Signed)
Completed PA for methocarbamol on covermymeds. Pending.

## 2015-10-01 NOTE — Telephone Encounter (Signed)
PA approved through 05/10/16. Notified pharm.  

## 2015-11-22 ENCOUNTER — Other Ambulatory Visit: Payer: Self-pay | Admitting: Family Medicine

## 2015-12-26 ENCOUNTER — Other Ambulatory Visit: Payer: Self-pay | Admitting: Family Medicine

## 2016-01-27 ENCOUNTER — Other Ambulatory Visit: Payer: Self-pay | Admitting: Family Medicine

## 2016-01-29 DIAGNOSIS — R399 Unspecified symptoms and signs involving the genitourinary system: Secondary | ICD-10-CM | POA: Diagnosis not present

## 2016-02-11 ENCOUNTER — Encounter: Payer: Self-pay | Admitting: Family Medicine

## 2016-02-11 ENCOUNTER — Ambulatory Visit: Payer: PPO | Admitting: Family Medicine

## 2016-02-11 ENCOUNTER — Ambulatory Visit (INDEPENDENT_AMBULATORY_CARE_PROVIDER_SITE_OTHER): Payer: PPO

## 2016-02-11 VITALS — BP 122/72 | HR 53 | Temp 98.4°F | Resp 16 | Ht 60.5 in | Wt 132.0 lb

## 2016-02-11 DIAGNOSIS — F329 Major depressive disorder, single episode, unspecified: Secondary | ICD-10-CM

## 2016-02-11 DIAGNOSIS — F99 Mental disorder, not otherwise specified: Secondary | ICD-10-CM | POA: Diagnosis not present

## 2016-02-11 DIAGNOSIS — I10 Essential (primary) hypertension: Secondary | ICD-10-CM | POA: Diagnosis not present

## 2016-02-11 DIAGNOSIS — M5030 Other cervical disc degeneration, unspecified cervical region: Secondary | ICD-10-CM | POA: Diagnosis not present

## 2016-02-11 DIAGNOSIS — R7302 Impaired glucose tolerance (oral): Secondary | ICD-10-CM

## 2016-02-11 DIAGNOSIS — M47812 Spondylosis without myelopathy or radiculopathy, cervical region: Secondary | ICD-10-CM | POA: Diagnosis not present

## 2016-02-11 DIAGNOSIS — F419 Anxiety disorder, unspecified: Secondary | ICD-10-CM

## 2016-02-11 DIAGNOSIS — Z23 Encounter for immunization: Secondary | ICD-10-CM

## 2016-02-11 DIAGNOSIS — M542 Cervicalgia: Secondary | ICD-10-CM

## 2016-02-11 DIAGNOSIS — B001 Herpesviral vesicular dermatitis: Secondary | ICD-10-CM | POA: Diagnosis not present

## 2016-02-11 DIAGNOSIS — E7800 Pure hypercholesterolemia, unspecified: Secondary | ICD-10-CM | POA: Diagnosis not present

## 2016-02-11 DIAGNOSIS — N3941 Urge incontinence: Secondary | ICD-10-CM | POA: Diagnosis not present

## 2016-02-11 DIAGNOSIS — F5105 Insomnia due to other mental disorder: Secondary | ICD-10-CM | POA: Diagnosis not present

## 2016-02-11 DIAGNOSIS — K219 Gastro-esophageal reflux disease without esophagitis: Secondary | ICD-10-CM | POA: Diagnosis not present

## 2016-02-11 DIAGNOSIS — F418 Other specified anxiety disorders: Secondary | ICD-10-CM | POA: Diagnosis not present

## 2016-02-11 LAB — COMPREHENSIVE METABOLIC PANEL
ALK PHOS: 61 U/L (ref 33–130)
ALT: 23 U/L (ref 6–29)
AST: 21 U/L (ref 10–35)
Albumin: 4.1 g/dL (ref 3.6–5.1)
BILIRUBIN TOTAL: 0.6 mg/dL (ref 0.2–1.2)
BUN: 10 mg/dL (ref 7–25)
CALCIUM: 9.5 mg/dL (ref 8.6–10.4)
CO2: 27 mmol/L (ref 20–31)
CREATININE: 0.74 mg/dL (ref 0.50–0.99)
Chloride: 107 mmol/L (ref 98–110)
GLUCOSE: 89 mg/dL (ref 65–99)
Potassium: 4.6 mmol/L (ref 3.5–5.3)
Sodium: 144 mmol/L (ref 135–146)
Total Protein: 6.3 g/dL (ref 6.1–8.1)

## 2016-02-11 LAB — CBC WITH DIFFERENTIAL/PLATELET
BASOS PCT: 1 %
Basophils Absolute: 73 cells/uL (ref 0–200)
EOS ABS: 219 {cells}/uL (ref 15–500)
Eosinophils Relative: 3 %
HEMATOCRIT: 35.4 % (ref 35.0–45.0)
Hemoglobin: 12.1 g/dL (ref 11.7–15.5)
LYMPHS PCT: 21 %
Lymphs Abs: 1533 cells/uL (ref 850–3900)
MCH: 31.7 pg (ref 27.0–33.0)
MCHC: 34.2 g/dL (ref 32.0–36.0)
MCV: 92.7 fL (ref 80.0–100.0)
MONO ABS: 438 {cells}/uL (ref 200–950)
MPV: 9.8 fL (ref 7.5–12.5)
Monocytes Relative: 6 %
NEUTROS PCT: 69 %
Neutro Abs: 5037 cells/uL (ref 1500–7800)
Platelets: 248 10*3/uL (ref 140–400)
RBC: 3.82 MIL/uL (ref 3.80–5.10)
RDW: 13.1 % (ref 11.0–15.0)
WBC: 7.3 10*3/uL (ref 3.8–10.8)

## 2016-02-11 LAB — LIPID PANEL
CHOL/HDL RATIO: 3.6 ratio (ref <=5.0)
Cholesterol: 197 mg/dL (ref 125–200)
HDL: 55 mg/dL (ref >=46)
LDL CALC: 99 mg/dL (ref <130)
Triglycerides: 213 mg/dL — ABNORMAL HIGH (ref <150)
VLDL: 43 mg/dL — AB (ref <30)

## 2016-02-11 LAB — POCT URINALYSIS DIP (MANUAL ENTRY)
Bilirubin, UA: NEGATIVE
GLUCOSE UA: NEGATIVE
Ketones, POC UA: NEGATIVE
Leukocytes, UA: NEGATIVE
NITRITE UA: NEGATIVE
PH UA: 6
PROTEIN UA: NEGATIVE
SPEC GRAV UA: 1.01
UROBILINOGEN UA: 0.2

## 2016-02-11 MED ORDER — CLONAZEPAM 1 MG PO TABS: ORAL_TABLET | ORAL | 3 refills | Status: DC

## 2016-02-11 MED ORDER — FLUOXETINE HCL 20 MG PO CAPS: 40.0000 mg | ORAL_CAPSULE | Freq: Every day | ORAL | 5 refills | Status: DC

## 2016-02-11 MED ORDER — SPIRONOLACTONE 50 MG PO TABS: 50.0000 mg | ORAL_TABLET | Freq: Every day | ORAL | 5 refills | Status: DC

## 2016-02-11 MED ORDER — PRAVASTATIN SODIUM 40 MG PO TABS: 40.0000 mg | ORAL_TABLET | Freq: Every day | ORAL | 5 refills | Status: DC

## 2016-02-11 MED ORDER — BUPROPION HCL ER (XL) 150 MG PO TB24: 150.0000 mg | ORAL_TABLET | Freq: Every day | ORAL | 5 refills | Status: DC

## 2016-02-11 MED ORDER — METOPROLOL SUCCINATE ER 25 MG PO TB24: 25.0000 mg | ORAL_TABLET | Freq: Every day | ORAL | 5 refills | Status: DC

## 2016-02-11 MED ORDER — ALBUTEROL SULFATE HFA 108 (90 BASE) MCG/ACT IN AERS: 2.0000 | INHALATION_SPRAY | RESPIRATORY_TRACT | 1 refills | Status: AC | PRN

## 2016-02-11 MED ORDER — VALACYCLOVIR HCL 1 G PO TABS: 1000.0000 mg | ORAL_TABLET | Freq: Two times a day (BID) | ORAL | 1 refills | Status: DC

## 2016-02-11 MED ORDER — PANTOPRAZOLE SODIUM 40 MG PO TBEC: 40.0000 mg | DELAYED_RELEASE_TABLET | Freq: Every day | ORAL | 11 refills | Status: DC

## 2016-02-11 NOTE — Patient Instructions (Signed)
     IF you received an x-ray today, you will receive an invoice from [facility]. Please contact [facility] at [phone] with questions or concerns regarding your invoice.   IF you received labwork today, you will receive an invoice from [facility]. Please contact [facility] at [phone] with questions or concerns regarding your invoice.   Our billing staff will not be able to assist you with questions regarding bills from these companies.  You will be contacted with the lab results as soon as they are available. The fastest way to get your results is to activate your My Chart account. Instructions are located on the last page of this paperwork. If you have not heard from us regarding the results in 2 weeks, please contact this office.      

## 2016-02-11 NOTE — Progress Notes (Signed)
Subjective:    Patient ID: Maria Sweeney, female    DOB: [birth year], 65 y.o.   MRN: [id number]  02/11/2016  Medication Refill (ALL RXs) and Depression (answers positive in triage)   HPI This 65 y.o. female presents for ten month follow-up for hypertension, GERD, anxiety and depression.  Just got over a bladder infection that had been going on a month.  Was also dizzy all the time; felt drunk all the time; prescribed Meclizine; diagnosed with fluid on the ear; resolved over the weekend. Had been occurring for several weeks to a month. Intermittent in nature.  Fell due to dizziness.  Climbed up to put something in the cabinet and got dizzy; fell.  Did not hurt anything.  Landed on buttocks.  Was so sore that night in B ribs, back B.  The next day, soreness resolved.  Not checking BP at home.  BP was high at the [facility] clinic recently.  Every time goes to grocery store, runs high.  High end.  Working now; working for the [employer]; working at end of [employer]; [employer] in [place].  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  Does not want stress.  Wanted regular hours; working 8-5; sleeping better.  No longer having difficult hours to maintain.  GERD is well controlled with Protonix.    Emotionally, doing moderately well.  Emotionless; no motivation.  When not working, would sleep most of the day and then not do anything during the day.  Now getting back on track with working.  Isolates self after work.  Lives alone.  Has two dogs.  Denies sadness.  Has a really great guy pt is dating; very positive and uplifting.  No anxiety.  No excessive crying.  History of dancing a lot in the past; ashamed of weight.  Clothes do not fit well.  Not happy with appearance.   Gotten rid of all clothes.  Not exercising.  Does not want to exercise alone; boyfriend lives in [place].  No close friends; close friend got married.  Taking clonazepam once per day; if does not take it, stutters and cannot talk.  Gets funny  feelings.  If misses one or two days, gets nervous feeling.  Has been maintained on it for years.  No SI.  Cannot tell if depression is better with Prozac.  Fatigue not any better especially when not working.  Still feels lazy.  Rather be sitting and laying down than anything.  Has been on Paxil, Zoloft, Wellbutrin, Citalopram.   Took Wellbutrin alone in the past.  No side effects to Wellbutrin.    R hip pain has been a problem for the past year; would radiate into lateral R calf and into R foot/toes.  When stopped working at [employer], much improved.  Now will have intermittent pains.  Certain movements will trigger.  Cement floor likely contributing.   Neck pain has recently become an issue. [facility] Gambler wanted to operate in 2003.  R shoulder also in bad shape.  Performed surgery on R shoulder and then L shoulder.  Then wanted to schedule cervical spine surgery in 2003.  Pain has worsened over the years.  Can hear the crepitance in neck.  Pain with sleeping. No recent evaluation by an orthopedist.  Urinary leakage; with urgency; wets the bed a lot.  Recent UTI. S/p abx therapy.  Progressively worsening.  Must wear a mattress cover.   Depression screen [facility] 2/9 02/11/2016 04/26/2015 02/22/2015 01/18/2015 04/23/2014  Decreased Interest 3 3 3  0 0  Down, Depressed, Hopeless 1 0 2 0 0  PHQ - 2 Score 4 3 5  0 0  Altered sleeping 2 3 3  - -  Tired, decreased energy 3 3 3  - -  Change in appetite 2 0 3 - -  Feeling bad or failure about yourself  2 2 3  - -  Trouble concentrating 1 0 2 - -  Moving slowly or fidgety/restless 1 0 0 - -  Suicidal thoughts 0 0 0 - -  PHQ-9 Score 15 11 19  - -     BP Readings from Last 3 Encounters:  02/11/16 122/72  04/26/15 135/72  02/22/15 132/76    Wt Readings from Last 3 Encounters:  02/11/16 132 lb (59.9 kg)  04/26/15 133 lb 3.2 oz (60.4 kg)  02/22/15 134 lb 6.4 oz (61 kg)     Review of Systems  Constitutional: Negative for chills, diaphoresis, fatigue and fever.   HENT: Positive for congestion. Negative for postnasal drip, rhinorrhea and sore throat.   Eyes: Negative for visual disturbance.  Respiratory: Positive for wheezing. Negative for cough and shortness of breath.   Cardiovascular: Negative for chest pain, palpitations and leg swelling.  Gastrointestinal: Negative for abdominal pain, constipation, diarrhea, nausea and vomiting.  Endocrine: Negative for cold intolerance, heat intolerance, polydipsia, polyphagia and polyuria.  Genitourinary: Positive for enuresis and urgency. Negative for decreased urine volume, difficulty urinating, dyspareunia, dysuria, flank pain, frequency, hematuria, pelvic pain, vaginal bleeding and vaginal discharge.  Musculoskeletal: Positive for neck pain and neck stiffness.  Neurological: Negative for dizziness, tremors, seizures, syncope, facial asymmetry, speech difficulty, weakness, light-headedness, numbness and headaches.  Psychiatric/Behavioral: Positive for dysphoric mood. Negative for self-injury, sleep disturbance and suicidal ideas. The patient is nervous/anxious. The patient is not hyperactive.     Past Medical History:  Diagnosis Date  . Anxiety   . Depression   . GERD (gastroesophageal reflux disease)   . Hyperlipidemia   . Hypertension   . IBS (irritable bowel syndrome)    Constipation with diarrhea.  . Oral herpes    Past Surgical History:  Procedure Laterality Date  . ABDOMINAL HYSTERECTOMY  1978   DUB; ovaries intact.  . APPENDECTOMY  1988  . BREAST BIOPSY Bilateral 2009   Calcium Deposit- 1980's and also in 2009/ Right side 1995  . COLONOSCOPY  05/11/2009   Normal.  Previous colonoscopy with polyps. [facility].    . DEBRIDEMENT TENNIS ELBOW    . ROTATOR CUFF REPAIR     Allergies  Allergen Reactions  . Amoxicillin   . Augmentin [Amoxicillin-Pot Clavulanate]   . Codeine   . Depakote [Divalproex Sodium]   . Latex   . Lipitor [Atorvastatin]   . Penicillins   . Septra  [Sulfamethoxazole-Trimethoprim]   . Zocor [Simvastatin]     Social History   Social History  . Marital status: Divorced    Spouse name: N/A  . Number of children: 2  . Years of education: N/A   Occupational History  . 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   Social History Main Topics  . Smoking status: Never Smoker  . Smokeless tobacco: Never Used  . Alcohol use 0.6 oz/week    1 Glasses of wine per week     Comment: rare alcohol use; once per year on average.  . Drug use: No  . Sexual activity: Yes    Partners: Male    Birth control/ protection: Surgical   Other Topics Concern  .  Not on file   Social History Narrative   Marital status: divorced since 1999; dating.      Children: one son, one daughter; 2 grandchildren.      Lives: alone     Employment:  part-time jobs at  [employer].      Tobacco: none      Alcohol: rare      Drugs: none      Exercise:  sporadic   Family History  Problem Relation Age of Onset  . Hyperlipidemia Mother   . Hypertension Mother   . Cancer Mother     lung  . Diabetes Mother   . Glaucoma Mother   . Stroke Mother 11  . Heart disease Mother   . Asthma Father   . Emphysema Father   . COPD Father   . Glaucoma Father   . Hypertension Sister        Objective:    BP 122/72 (BP Location: Left Arm, Patient Position: Sitting, Cuff Size: Normal)   Pulse (!) 53   Temp 98.4 F (36.9 C) (Oral)   Resp 16   Ht 5' 0.5" (1.537 m)   Wt 132 lb (59.9 kg)   SpO2 96%   BMI 25.36 kg/m  Physical Exam  Constitutional: She is oriented to person, place, and time. She appears well-developed and well-nourished. No distress.  HENT:  Head: Normocephalic and atraumatic.  Right Ear: External ear normal.  Left Ear: External ear normal.  Nose: Nose normal.  Mouth/Throat: Oropharynx is clear and moist.  Eyes: Conjunctivae and EOM are normal. Pupils are equal, round, and reactive to light.  Neck: Normal range of motion.  Neck supple. Carotid bruit is not present. No thyromegaly present.  Cardiovascular: Normal rate, regular rhythm, normal heart sounds and intact distal pulses.  Exam reveals no gallop and no friction rub.   No murmur heard. Pulmonary/Chest: Effort normal and breath sounds normal. She has no wheezes. She has no rales.  Abdominal: Soft. Bowel sounds are normal. She exhibits no distension and no mass. There is no tenderness. There is no rebound and no guarding.  Musculoskeletal:       Cervical back: She exhibits normal range of motion, no tenderness, no bony tenderness, no pain and no spasm.  Lymphadenopathy:    She has no cervical adenopathy.  Neurological: She is alert and oriented to person, place, and time. No cranial nerve deficit.  Skin: Skin is warm and dry. No rash noted. She is not diaphoretic. No erythema. No pallor.  Psychiatric: She has a normal mood and affect. Her behavior is normal. Judgment and thought content normal.    Dg Cervical Spine Complete  Result Date: 02/11/2016 CLINICAL DATA:  Chronic cervical spine pain EXAM: CERVICAL SPINE - COMPLETE 4+ VIEW COMPARISON:  09/06/2015 FINDINGS: Normal alignment of the cervical spine. The vertebral body heights are well preserved. There is multi level disc space narrowing and ventral endplate spurring. This is most advanced at C4-5 and C5-6. Bilateral neuroforaminal narrowing is identified. On the left this is most advanced at C4-5. On the right this is most advanced at C3-4 C4-5 and C5-6. IMPRESSION: 1. Advanced cervical spondylosis with bilateral neuroforaminal narrowing. Electronically Signed   By: Kerby Moors M.D.   On: 02/11/2016 12:50       Assessment & Plan:   1. Essential hypertension, benign   2. Pure hypercholesterolemia   3. Anxiety and depression   4. Insomnia due to other mental disorder   5.  Degenerative disc disease, cervical   6. Herpes labialis   7. Gastroesophageal reflux disease without esophagitis   8. Glucose  intolerance (impaired glucose tolerance)   9. Urge incontinence   10. Neck pain   11. Need for prophylactic vaccination and inoculation against influenza    -BP well controlled in the office today. -recent UTI; send urine culture to confirm resolved; refer to urology due to worsening urge incontinence and enuresis. -add Wellbutrin XL 150mg  daily to Prozac 40mg  daily due to persistent depression.  Recommend decreasing Klonopin to 1/2 tablet daily. -obtain cervical spine films; chronic neck pain; s/p consultation with [facility] in 2003; s/p MRI neck in 2003. Consider referral to ortho due to worsening pain.   Orders Placed This Encounter  Procedures  . Urine culture  . DG Cervical Spine Complete    Standing Status:   Future    Standing Expiration Date:   02/10/2017    Order Specific Question:   Reason for Exam (SYMPTOM  OR DIAGNOSIS REQUIRED)    Answer:   cervical spine pain with recent worsening    Order Specific Question:   Preferred imaging location?    Answer:   External  . Flu Vaccine QUAD 36+ mos IM  . CBC with Differential/Platelet  . Comprehensive metabolic panel    Order Specific Question:   Has the patient fasted?    Answer:   Yes  . Hemoglobin A1c  . Lipid panel    Order Specific Question:   Has the patient fasted?    Answer:   Yes  . Ambulatory referral to Urology    Referral Priority:   Routine    Referral Type:   Consultation    Referral Reason:   Specialty Services Required    Requested Specialty:   Urology    Number of Visits Requested:   1  . POCT urinalysis dipstick   Meds ordered this encounter  Medications  . buPROPion (WELLBUTRIN XL) 150 MG 24 hr tablet    Sig: Take 1 tablet (150 mg total) by mouth daily.    Dispense:  30 tablet    Refill:  5  . valACYclovir (VALTREX) 1000 MG tablet    Sig: Take 1 tablet (1,000 mg total) by mouth 2 (two) times daily.    Dispense:  30 tablet    Refill:  1  . spironolactone (ALDACTONE) 50 MG tablet    Sig: Take 1 tablet  (50 mg total) by mouth daily.    Dispense:  30 tablet    Refill:  5  . pravastatin (PRAVACHOL) 40 MG tablet    Sig: Take 1 tablet (40 mg total) by mouth daily.    Dispense:  30 tablet    Refill:  5  . pantoprazole (PROTONIX) 40 MG tablet    Sig: Take 1 tablet (40 mg total) by mouth daily.    Dispense:  30 tablet    Refill:  11  . metoprolol succinate (TOPROL-XL) 25 MG 24 hr tablet    Sig: Take 1 tablet (25 mg total) by mouth daily.    Dispense:  30 tablet    Refill:  5  . FLUoxetine (PROZAC) 20 MG capsule    Sig: Take 2 capsules (40 mg total) by mouth daily.    Dispense:  60 capsule    Refill:  5  . clonazePAM (KLONOPIN) 1 MG tablet    Sig: TAKE ONE HALF TABLET DAILY PRN    Dispense:  30 tablet    Refill:  3  . albuterol (PROVENTIL HFA;VENTOLIN HFA) 108 (90 Base) MCG/ACT inhaler    Sig: Inhale 2 puffs into the lungs every 4 (four) hours as needed for wheezing or shortness of breath (cough, shortness of breath or wheezing.).    Dispense:  1 Inhaler    Refill:  1    Return in about 3 months (around 05/13/2016) for complete physical examiniation.   Nasiir Monts Elayne Guerin, M.D. [facility] [address] [place], [place]  [address] [phone] phone [phone] fax

## 2016-02-12 LAB — HEMOGLOBIN A1C
Hgb A1c MFr Bld: 5.6 % (ref <5.7)
Mean Plasma Glucose: 114 mg/dL

## 2016-02-13 LAB — URINE CULTURE: Organism ID, Bacteria: NO GROWTH

## 2016-02-25 ENCOUNTER — Telehealth: Payer: Self-pay

## 2016-02-25 NOTE — Telephone Encounter (Signed)
PATIENT STATES SHE SAW DR. Tamala Julian A COUPLE OF WEEKS AGO AND DR. Tamala Julian ORDERED HER TO HAVE A NECK X-RAY. SHE SAID SOMEONE TRIED TO CALL HER TODAY, BUT SHE DID NOT GET TO ANSWER AND WE DID NOT LEAVE A MESSAGE. SHE THINKS IT MAY BE TO GIVE HER THE RESULTS OF HER NECK X-RAY BECAUSE NO ONE EVER TOLD HER. BEST PHONE [phone] (CELL)  Circle

## 2016-02-28 NOTE — Telephone Encounter (Signed)
Pt is requesting imagine results Please post results

## 2016-02-28 NOTE — Telephone Encounter (Signed)
Call --- cervical spine xrays shows severe degenerative changes most pronounced at C4-5 and C5-6.

## 2016-03-02 NOTE — Telephone Encounter (Signed)
Patient aware of results via phone  

## 2016-03-19 ENCOUNTER — Telehealth: Payer: Self-pay | Admitting: Physician Assistant

## 2016-03-19 ENCOUNTER — Encounter: Payer: Self-pay | Admitting: Emergency Medicine

## 2016-03-19 ENCOUNTER — Emergency Department
Admission: EM | Admit: 2016-03-19 | Discharge: 2016-03-19 | Disposition: A | Discharge status: Home / Self Care | Payer: PPO | Attending: Emergency Medicine | Admitting: Emergency Medicine

## 2016-03-19 DIAGNOSIS — I10 Essential (primary) hypertension: Secondary | ICD-10-CM | POA: Insufficient documentation

## 2016-03-19 DIAGNOSIS — Z79899 Other long term (current) drug therapy: Secondary | ICD-10-CM | POA: Diagnosis not present

## 2016-03-19 DIAGNOSIS — R202 Paresthesia of skin: Secondary | ICD-10-CM | POA: Diagnosis not present

## 2016-03-19 DIAGNOSIS — F419 Anxiety disorder, unspecified: Secondary | ICD-10-CM

## 2016-03-19 DIAGNOSIS — Z91040 Latex allergy status: Secondary | ICD-10-CM | POA: Diagnosis not present

## 2016-03-19 LAB — BASIC METABOLIC PANEL
ANION GAP: 10 (ref 5–15)
BUN: 16 mg/dL (ref 6–20)
CO2: 25 mmol/L (ref 22–32)
Calcium: 10.2 mg/dL (ref 8.9–10.3)
Chloride: 103 mmol/L (ref 101–111)
Creatinine, Ser: 0.99 mg/dL (ref 0.44–1.00)
GFR, EST NON AFRICAN AMERICAN: 58 mL/min — AB (ref >60)
Glucose, Bld: 101 mg/dL — ABNORMAL HIGH (ref 65–99)
POTASSIUM: 3.4 mmol/L — AB (ref 3.5–5.1)
SODIUM: 138 mmol/L (ref 135–145)

## 2016-03-19 LAB — CBC WITH DIFFERENTIAL/PLATELET
BASOS ABS: 0.1 10*3/uL (ref 0–0.1)
BASOS PCT: 1 %
EOS ABS: 0.1 10*3/uL (ref 0–0.7)
EOS PCT: 1 %
HCT: 41.9 % (ref 35.0–47.0)
HEMOGLOBIN: 14.9 g/dL (ref 12.0–16.0)
Lymphocytes Relative: 17 %
Lymphs Abs: 1.8 10*3/uL (ref 1.0–3.6)
MCH: 32.5 pg (ref 26.0–34.0)
MCHC: 35.6 g/dL (ref 32.0–36.0)
MCV: 91.5 fL (ref 80.0–100.0)
Monocytes Absolute: 0.6 10*3/uL (ref 0.2–0.9)
Monocytes Relative: 5 %
Neutro Abs: 8.1 10*3/uL — ABNORMAL HIGH (ref 1.4–6.5)
Neutrophils Relative %: 76 %
PLATELETS: 297 10*3/uL (ref 150–440)
RBC: 4.58 MIL/uL (ref 3.80–5.20)
RDW: 12.7 % (ref 11.5–14.5)
WBC: 10.7 10*3/uL (ref 3.6–11.0)

## 2016-03-19 LAB — URINALYSIS COMPLETE WITH MICROSCOPIC (ARMC ONLY)
BACTERIA UA: NONE SEEN
BILIRUBIN URINE: NEGATIVE
GLUCOSE, UA: NEGATIVE mg/dL
Leukocytes, UA: NEGATIVE
NITRITE: NEGATIVE
Protein, ur: NEGATIVE mg/dL
SPECIFIC GRAVITY, URINE: 1.017 (ref 1.005–1.030)
pH: 5 (ref 5.0–8.0)

## 2016-03-19 LAB — TSH: TSH: 3.506 u[IU]/mL (ref 0.350–4.500)

## 2016-03-19 LAB — TROPONIN I

## 2016-03-19 MED ORDER — SODIUM CHLORIDE 0.9 % IV BOLUS (SEPSIS)
1000.0000 mL | Freq: Once | INTRAVENOUS | Status: AC
  Administered 2016-03-19: 1000 mL via INTRAVENOUS

## 2016-03-19 NOTE — ED Notes (Signed)
Pt reports that she was tapered off of her klonopin about 1 week ago and for a few days she has been experiencing ringing in her ears and yesterday she began to have numbness only around her mouth. Pt also reports she has been smelling a "burnt toast" smell since yesterday.

## 2016-03-19 NOTE — ED Notes (Signed)

## 2016-03-19 NOTE — BH Assessment (Signed)
ED physician request patient be provided with referrals to address patients anxiety. Patient given referral to [facility].

## 2016-03-19 NOTE — ED Triage Notes (Signed)
Tongue numbness, x2 days, increased hearing sensation , lump in her throat , dry mouth sensation .  Hx of anxiety, panic attacks, PCP last week took her of her clonazepam,

## 2016-03-19 NOTE — ED Provider Notes (Addendum)
[facility] Emergency Department Provider Note  ____________________________________________   I have reviewed the triage vital signs and the nursing notes.   HISTORY  Chief Complaint Numbness    HPI Maria Sweeney is a 65 y.o. female who presents today stating that she is feeling very anxious. Patient has been on clonazepam for 20 years. 1 mg daily. Several weeks ago, she elected to, with the advice of her doctor, wean herself off. She does have a history of anxiety and depression. She has no SI or HI. She appropriately took a half pill for several weeks and then last Wednesday stopped taking entirely. She had some nausea and felt an upsetstomach. Felt jittery. All of her physiologic symptoms seem to stop by Monday and she is feeling better but now she is having panic attacks. She describes dry mouth and a feeling of generalized anxiety. She denies chest pain or shortness of breath nausea or vomiting. She does not feel shaky. She did not have a seizure. She did not pass out. But she feels generally unwell. She states the sensation she has when she has her panic attacks and she is having multiple panic attacks. Patient lives by herself, she does not have a job at this time, she is she states without anything else to do but think about her anxiety all day long. She does have family. Her doctor, because of her anxiety, did initiate treatment with Wellbutrin about one month ago, Dr. also is continuing her Prozac. She states she does have a history of somatization, and she was initially started on the clonazepam because she had what was deemed to be a grief reaction that manifested itself is left-sided weakness after extensive studies including MRI many years ago. The patient states that her symptoms at this time are dry mouth, altered sense of taste, and anxiety. She denies any headache or vomiting and she denies any focal numbness or weakness or difficulty speaking     Past  Medical History:  Diagnosis Date  . Anxiety   . Depression   . GERD (gastroesophageal reflux disease)   . Hyperlipidemia   . Hypertension   . IBS (irritable bowel syndrome)    Constipation with diarrhea.  . Oral herpes     Patient Active Problem List   Diagnosis Date Noted  . Pure hypercholesterolemia 11/30/2014  . Degenerative disc disease, cervical 04/24/2014  . Essential hypertension, benign 10/19/2013  . Insomnia 10/19/2013  . Herpes labialis 10/19/2013  . Anxiety and depression 07/25/2012  . GERD (gastroesophageal reflux disease) 07/25/2012    Past Surgical History:  Procedure Laterality Date  . ABDOMINAL HYSTERECTOMY  1978   DUB; ovaries intact.  . APPENDECTOMY  1988  . BREAST BIOPSY Bilateral 2009   Calcium Deposit- 1980's and also in 2009/ Right side 1995  . COLONOSCOPY  05/11/2009   Normal.  Previous colonoscopy with polyps. [facility].    . DEBRIDEMENT TENNIS ELBOW    . ROTATOR CUFF REPAIR      Prior to Admission medications   Medication Sig Start Date End Date Taking? Authorizing Provider  albuterol (PROVENTIL HFA;VENTOLIN HFA) 108 (90 Base) MCG/ACT inhaler Inhale 2 puffs into the lungs every 4 (four) hours as needed for wheezing or shortness of breath (cough, shortness of breath or wheezing.). 02/11/16   Wardell Honour, MD  buPROPion (WELLBUTRIN XL) 150 MG 24 hr tablet Take 1 tablet (150 mg total) by mouth daily. 02/11/16   Wardell Honour, MD  clonazePAM (KLONOPIN) 1 MG  tablet TAKE ONE HALF TABLET DAILY PRN 02/11/16   Wardell Honour, MD  FLUoxetine (PROZAC) 20 MG capsule Take 2 capsules (40 mg total) by mouth daily. 02/11/16   Wardell Honour, MD  methocarbamol (ROBAXIN) 500 MG tablet TAKE ONE OR TWO TABLETS AT BEDTIME 09/24/15   Wardell Honour, MD  metoprolol succinate (TOPROL-XL) 25 MG 24 hr tablet Take 1 tablet (25 mg total) by mouth daily. 02/11/16   Wardell Honour, MD  pantoprazole (PROTONIX) 40 MG tablet Take 1 tablet (40 mg total) by mouth daily. 02/11/16   Wardell Honour, MD  pravastatin (PRAVACHOL) 40 MG tablet Take 1 tablet (40 mg total) by mouth daily. 02/11/16   Wardell Honour, MD  spironolactone (ALDACTONE) 50 MG tablet Take 1 tablet (50 mg total) by mouth daily. 02/11/16   Wardell Honour, MD  valACYclovir (VALTREX) 1000 MG tablet Take 1 tablet (1,000 mg total) by mouth 2 (two) times daily. 02/11/16   Wardell Honour, MD    Allergies Amoxicillin; Augmentin [amoxicillin-pot clavulanate]; Codeine; Depakote [divalproex sodium]; Latex; Lipitor [atorvastatin]; Penicillins; Septra [sulfamethoxazole-trimethoprim]; and Zocor [simvastatin]  Family History  Problem Relation Age of Onset  . Hyperlipidemia Mother   . Hypertension Mother   . Cancer Mother     lung  . Diabetes Mother   . Glaucoma Mother   . Stroke Mother 61  . Heart disease Mother   . Asthma Father   . Emphysema Father   . COPD Father   . Glaucoma Father   . Hypertension Sister     Social History Social History  Substance Use Topics  . Smoking status: Never Smoker  . Smokeless tobacco: Never Used  . Alcohol use 0.6 oz/week    1 Glasses of wine per week     Comment: rare alcohol use; once per year on average.    Review of Systems Constitutional: No fever/chills Eyes: No visual changes. ENT: No sore throat. No stiff neck no neck pain Cardiovascular: Denies chest pain. Respiratory: Denies shortness of breath. Gastrointestinal:   no vomiting.  No diarrhea.  No constipation. Genitourinary: Negative for dysuria. Musculoskeletal: Negative lower extremity swelling Skin: Negative for rash. Neurological: Negative for severe headaches, focal weakness or numbness. 10-point ROS otherwise negative.  ____________________________________________   PHYSICAL EXAM:  VITAL SIGNS: ED Triage Vitals [03/19/16 1000]  Enc Vitals Group     BP (!) 147/82     Pulse Rate 71     Resp 16     Temp 97.9 F (36.6 C)     Temp Source Oral     SpO2 97 %     Weight 126 lb (57.2 kg)     Height 5'  (1.524 m)     Head Circumference      Peak Flow      Pain Score 0     Pain Loc      Pain Edu?      Excl. in [place]?     Constitutional: Alert and oriented. Well appearing and in no acute distress. Eyes: Conjunctivae are normal. PERRL. EOMI. Head: Atraumatic. Nose: No congestion/rhinnorhea. Mouth/Throat: Mucous membranes are moist.  Oropharynx non-erythematous. Neck: No stridor.   Nontender with no meningismus Cardiovascular: Normal rate, regular rhythm. Grossly normal heart sounds.  Good peripheral circulation. Respiratory: Normal respiratory effort.  No retractions. Lungs CTAB. Abdominal: Soft and nontender. No distention. No guarding no rebound Back:  There is no focal tenderness or step off.  there is no midline tenderness  there are no lesions noted. there is no CVA tenderness Musculoskeletal: No lower extremity tenderness, no upper extremity tenderness. No joint effusions, no DVT signs strong distal pulses no edema Neurologic:  Normal speech and language. No gross focal neurologic deficits are appreciated.  Skin:  Skin is warm, dry and intact. No rash noted. Psychiatric: Mood and affect are Anxious. Speech and behavior are normal.  ____________________________________________   LABS (all labs ordered are listed, but only abnormal results are displayed)  Labs Reviewed  CBC WITH DIFFERENTIAL/PLATELET  BASIC METABOLIC PANEL  TROPONIN I  TSH  URINALYSIS COMPLETEWITH MICROSCOPIC ([place])   ____________________________________________  EKG  I personally interpreted any EKGs ordered by me or triage Normal sinus rhythm rate 60 bpm no acute ST elevation or depression is severe C changes normal axis ____________________________________________  RADIOLOGY  I reviewed any imaging ordered by me or triage that were performed during my shift and, if possible, patient and/or family made aware of any abnormal  findings. ____________________________________________   PROCEDURES  Procedure(s) performed: None  Procedures  Critical Care performed: None  ____________________________________________   INITIAL IMPRESSION / ASSESSMENT AND PLAN / ED COURSE  Pertinent labs & imaging results that were available during my care of the patient were reviewed by me and considered in my medical decision making (see chart for details).  Discussed with the patient, this is a difficult situation she is appropriately weaned herself off of an addictive medication and now feels the loss. Physiologically there is no evidence of acute benzo withdrawal. She is not tremulous, her heart rate is normal, etc. There is no evidence of acute withdrawal toxidrome and there doesn't seem to be any evidence of any incipient seizure. However, patient also obviously has a psychologic dependence on this medication which she agrees that she has. She is worried that all of these symptoms could be "something else" even though she "knows it's a panic attack". For her reassurance and given her age, I will obtain EKG basic blood work and a thyroid panel, it is my hope that this provides her with some reassurance. She also feels that she might be mildly dehydrated because she has not been eating or drinking very well, and we'll give her IV fluids. She has no SI or HI, she does not wish or I think qualify for acute inpatient psychiatric care at this time. She has adequate outpatient care. I have stressed the need to get counseled the patient and family are very comfortable with this. The last and they want me to do is give her benzodiazepines, she is already on many different anti-anxiety medications.  ----------------------------------------- 3:29 PM on 03/19/2016 -----------------------------------------  Patient feels very reassured neurologic exam still remains nonfocal no evidence of CVA C as PE dissection infection significant  electrolyte abnormality or thyroid pathology. Patient is in no acute distress. She feels much more calm at this time. She is eager to go home. She contracts for safety. She has no SI or HI. She will follow-up as an outpatient with resources provided by TTS here, who are seeing her now. I am calling her primary care doctor to make them aware of her status.  ----------------------------------------- 3:31 PM on 03/19/2016 ----------------------------------------- Bernita Buffy, PA at her  primary care doctor is made aware by phone of all the results, they will follow closely tomorrow or in the next few days. They will call her for further follow-up.  Clinical Course    ____________________________________________   FINAL CLINICAL IMPRESSION(S) / ED DIAGNOSES  Final diagnoses:  None      This chart was dictated using voice recognition software.  Despite best efforts to proofread,  errors can occur which can change meaning.      Schuyler Amor, MD 03/19/16 [place], MD 03/19/16 [phone]

## 2016-03-19 NOTE — Telephone Encounter (Signed)
Dr Burlene Arnt called from [facility] - the patient presented to the ED - she has had increase anxiety and panic since decreased/stop of her klonopin - she is not interested in restarting the klonopin - he gave her community resources and ideas to help with stress - he would like her to f/u here next week.

## 2016-03-19 NOTE — Telephone Encounter (Signed)
Can we please call the patient and get her an appt with Dr Tamala Julian next week please. Then can we please send on to Dr Tamala Julian for her review.

## 2016-03-20 NOTE — Telephone Encounter (Signed)
Appt made for Friday 11/17 @ 10am

## 2016-03-21 ENCOUNTER — Ambulatory Visit (INDEPENDENT_AMBULATORY_CARE_PROVIDER_SITE_OTHER): Payer: PPO | Admitting: Physician Assistant

## 2016-03-21 VITALS — BP 130/88 | HR 80 | Temp 97.6°F | Resp 16 | Ht 61.0 in | Wt 127.0 lb

## 2016-03-21 DIAGNOSIS — T50905A Adverse effect of unspecified drugs, medicaments and biological substances, initial encounter: Secondary | ICD-10-CM

## 2016-03-21 DIAGNOSIS — T887XXA Unspecified adverse effect of drug or medicament, initial encounter: Secondary | ICD-10-CM

## 2016-03-21 DIAGNOSIS — Z7189 Other specified counseling: Secondary | ICD-10-CM

## 2016-03-21 DIAGNOSIS — F418 Other specified anxiety disorders: Secondary | ICD-10-CM

## 2016-03-21 DIAGNOSIS — F32A Depression, unspecified: Secondary | ICD-10-CM

## 2016-03-21 DIAGNOSIS — F419 Anxiety disorder, unspecified: Principal | ICD-10-CM

## 2016-03-21 DIAGNOSIS — F329 Major depressive disorder, single episode, unspecified: Secondary | ICD-10-CM

## 2016-03-21 MED ORDER — CLONAZEPAM 1 MG PO TABS: ORAL_TABLET | ORAL | 3 refills | Status: DC

## 2016-03-21 MED ORDER — FLUOXETINE HCL 20 MG PO CAPS: 40.0000 mg | ORAL_CAPSULE | Freq: Every day | ORAL | 5 refills | Status: DC

## 2016-03-21 NOTE — Patient Instructions (Addendum)
Increase your dose of Prozac to 77m/day. Stop Wellbutrin.  Please take 1/4 pill Clonopin daily for the next week. Then take 1/4 pill every other day for one week.  Please keep your appointment with Dr. STamala Julianthis Friday. You will see her at [address]   Thank you for coming in today. I hope you feel we met your needs.  Feel free to call [facility] if you have any questions or further requests.  Please consider signing up for MyChart if you do not already have it, as this is a great way to communicate with me.  Best,  Whitney McVey, PA-C  IF you received an x-ray today, you will receive an invoice from [facility]. Please contact [facility] at [phone] questions or concerns regarding your invoice.   IF you received labwork today, you will receive an invoice from [employer] Please contact [facility] at [phone] questions or concerns regarding your invoice.   Our billing staff will not be able to assist you with questions regarding bills from these companies.  You will be contacted with the lab results as soon as they are available. The fastest way to get your results is to activate your My Chart account. Instructions are located on the last page of this paperwork. If you have not heard from [facility] the results in 2 weeks, please contact this office.

## 2016-03-21 NOTE — Progress Notes (Signed)
Maria Sweeney  MRN: [id number] DOB: 1951-05-04  PCP: Reginia Forts, MD  Subjective:  Pt is a 64 year old female, PMH anxiety, depression, insomnia, HTN, who presents to clinic for possible medication reaction. She is here today with a friend.   Dr. Tamala Julian is her PCP. She started Wellbutrin 6 weeks ago. Since that time, she notes dry mouth and numbness around mouth, dizziness. At night feels like she is choking, cannot differentiate whether this is due to dryness of mouth, anxiety, or something else.   D/c'd clonopin one week ago under the direction of Dr. Tamala Julian.  She has been on this medication x 20 years.  She tapered off of it. However she thinks she is experiencing withdrawals, "it's been rough" Can't taste or smell. Is very anxious and cannot sleep. Feels like cold water is being poured  across the upper part of her back.   Was seen in the emergency department two days ago for the same, as she says she could not get in touch with [facility]. She tried calling "[phone]" but could not get an answer or an answering machine. Emergency department labs and EKG were reassuring.   States she has several medication allergies, these are similar symptoms she experiences when she has a medication reaction.   Denies SI, HI  Is taking Prozac. No reported side effects.  Dr. Tamala Julian is her PCP. Has appt with her in 6 days.   Review of Systems  Neurological: Positive for dizziness, light-headedness and numbness. Negative for weakness.  Psychiatric/Behavioral: Positive for sleep disturbance.    Patient Active Problem List   Diagnosis Date Noted  . Pure hypercholesterolemia 11/30/2014  . Degenerative disc disease, cervical 04/24/2014  . Essential hypertension, benign 10/19/2013  . Insomnia 10/19/2013  . Herpes labialis 10/19/2013  . Anxiety and depression 07/25/2012  . GERD (gastroesophageal reflux disease) 07/25/2012    Current Outpatient Prescriptions on File Prior to Visit  Medication Sig Dispense  Refill  . albuterol (PROVENTIL HFA;VENTOLIN HFA) 108 (90 Base) MCG/ACT inhaler Inhale 2 puffs into the lungs every 4 (four) hours as needed for wheezing or shortness of breath (cough, shortness of breath or wheezing.). 1 Inhaler 1  . buPROPion (WELLBUTRIN XL) 150 MG 24 hr tablet Take 1 tablet (150 mg total) by mouth daily. 30 tablet 5  . FLUoxetine (PROZAC) 20 MG capsule Take 2 capsules (40 mg total) by mouth daily. 60 capsule 5  . methocarbamol (ROBAXIN) 500 MG tablet TAKE ONE OR TWO TABLETS AT BEDTIME 60 tablet 0  . metoprolol succinate (TOPROL-XL) 25 MG 24 hr tablet Take 1 tablet (25 mg total) by mouth daily. 30 tablet 5  . pantoprazole (PROTONIX) 40 MG tablet Take 1 tablet (40 mg total) by mouth daily. 30 tablet 11  . pravastatin (PRAVACHOL) 40 MG tablet Take 1 tablet (40 mg total) by mouth daily. 30 tablet 5  . spironolactone (ALDACTONE) 50 MG tablet Take 1 tablet (50 mg total) by mouth daily. 30 tablet 5  . valACYclovir (VALTREX) 1000 MG tablet Take 1 tablet (1,000 mg total) by mouth 2 (two) times daily. 30 tablet 1   No current facility-administered medications on file prior to visit.     Allergies  Allergen Reactions  . Amoxicillin   . Augmentin [Amoxicillin-Pot Clavulanate]   . Codeine   . Depakote [Divalproex Sodium]   . Latex   . Lipitor [Atorvastatin]   . Penicillins   . Septra [Sulfamethoxazole-Trimethoprim]   . Zocor [Simvastatin]      Objective:  BP 130/88 (BP Location: Right Arm, Patient Position: Sitting, Cuff Size: Normal)   Pulse 80   Temp 97.6 F (36.4 C) (Oral)   Resp 16   Ht 5\' 1"  (1.549 m)   Wt 127 lb (57.6 kg)   SpO2 96%   BMI 24.00 kg/m   Physical Exam  Constitutional: She is oriented to person, place, and time and well-developed, well-nourished, and in no distress. No distress.  Cardiovascular: Normal rate, regular rhythm and normal heart sounds.   Neurological: She is alert and oriented to person, place, and time. GCS score is 15.  Skin: Skin is  warm and dry.  Psychiatric: Memory and judgment normal. Her mood appears anxious. She has a flat affect.  Vitals reviewed.   Assessment and Plan :  This case was discussed with Dr. Tamala Julian.   1. Anxiety and depression 2. Adverse effect of drug, initial encounter 3. Encounter for medication counseling - FLUoxetine (PROZAC) 20 MG capsule; Take 2 capsules (40 mg total) by mouth daily.  Dispense: 60 capsule; Refill: 5 - clonazePAM (KLONOPIN) 1 MG tablet; TAKE ONE HALF TABLET DAILY PRN  Dispense: 30 tablet; Refill: 3 - D/c Wellbutrin. Increase daily Prozac to 60mg /day. Start a tapering dose of Clonopin 1/4 pill daily x one week, 1/4 every oterh day x one week. She is extremely hesitant to start Clonopin again, as she is concerned of more withdrawal symptoms, she states "I have been through the worst, I do not want to go through that again." Discussed with patient this may help relieve her symptoms. She agrees to d/c Wellbutrin and increase Prozac, however is undecided about Clonopin. Instructions printed out for patient should she change her mind. She is to keep her appointment with Dr. Tamala Julian in 6 days. She is encouraged to call or RTC in the meantime if symptoms worsen/persist.   Mercer Pod, PA-C  [facility] 03/21/2016 9:07 AM

## 2016-03-27 ENCOUNTER — Ambulatory Visit (INDEPENDENT_AMBULATORY_CARE_PROVIDER_SITE_OTHER): Payer: PPO | Admitting: Family Medicine

## 2016-03-27 ENCOUNTER — Other Ambulatory Visit: Payer: Self-pay | Admitting: Family Medicine

## 2016-03-27 VITALS — BP 122/84 | HR 72 | Temp 98.2°F | Resp 16 | Ht 61.0 in | Wt 128.0 lb

## 2016-03-27 DIAGNOSIS — F32A Depression, unspecified: Secondary | ICD-10-CM

## 2016-03-27 DIAGNOSIS — M5030 Other cervical disc degeneration, unspecified cervical region: Secondary | ICD-10-CM | POA: Diagnosis not present

## 2016-03-27 DIAGNOSIS — F329 Major depressive disorder, single episode, unspecified: Secondary | ICD-10-CM

## 2016-03-27 DIAGNOSIS — F5105 Insomnia due to other mental disorder: Secondary | ICD-10-CM

## 2016-03-27 DIAGNOSIS — R202 Paresthesia of skin: Secondary | ICD-10-CM

## 2016-03-27 DIAGNOSIS — F418 Other specified anxiety disorders: Secondary | ICD-10-CM

## 2016-03-27 DIAGNOSIS — F419 Anxiety disorder, unspecified: Secondary | ICD-10-CM

## 2016-03-27 DIAGNOSIS — E876 Hypokalemia: Secondary | ICD-10-CM

## 2016-03-27 DIAGNOSIS — F99 Mental disorder, not otherwise specified: Secondary | ICD-10-CM

## 2016-03-27 DIAGNOSIS — K219 Gastro-esophageal reflux disease without esophagitis: Secondary | ICD-10-CM

## 2016-03-27 LAB — CBC WITH DIFFERENTIAL/PLATELET
Basophils Absolute: 0 cells/uL (ref 0–200)
Basophils Relative: 0 %
Eosinophils Absolute: 91 cells/uL (ref 15–500)
Eosinophils Relative: 1 %
HCT: 40.8 % (ref 35.0–45.0)
Hemoglobin: 14.2 g/dL (ref 11.7–15.5)
Lymphocytes Relative: 17 %
Lymphs Abs: 1547 cells/uL (ref 850–3900)
MCH: 32.1 pg (ref 27.0–33.0)
MCHC: 34.8 g/dL (ref 32.0–36.0)
MCV: 92.1 fL (ref 80.0–100.0)
MPV: 9.8 fL (ref 7.5–12.5)
Monocytes Absolute: 637 cells/uL (ref 200–950)
Monocytes Relative: 7 %
Neutro Abs: 6825 cells/uL (ref 1500–7800)
Neutrophils Relative %: 75 %
Platelets: 332 10*3/uL (ref 140–400)
RBC: 4.43 MIL/uL (ref 3.80–5.10)
RDW: 13.5 % (ref 11.0–15.0)
WBC: 9.1 10*3/uL (ref 3.8–10.8)

## 2016-03-27 LAB — COMPREHENSIVE METABOLIC PANEL
ALT: 16 U/L (ref 6–29)
AST: 14 U/L (ref 10–35)
Albumin: 4.7 g/dL (ref 3.6–5.1)
Alkaline Phosphatase: 72 U/L (ref 33–130)
BUN: 8 mg/dL (ref 7–25)
CO2: 27 mmol/L (ref 20–31)
Calcium: 10.3 mg/dL (ref 8.6–10.4)
Chloride: 105 mmol/L (ref 98–110)
Creat: 0.89 mg/dL (ref 0.50–0.99)
Glucose, Bld: 103 mg/dL — ABNORMAL HIGH (ref 65–99)
Potassium: 4.1 mmol/L (ref 3.5–5.3)
Sodium: 142 mmol/L (ref 135–146)
Total Bilirubin: 0.8 mg/dL (ref 0.2–1.2)
Total Protein: 7.1 g/dL (ref 6.1–8.1)

## 2016-03-27 MED ORDER — GABAPENTIN 100 MG PO CAPS: 100.0000 mg | ORAL_CAPSULE | Freq: Three times a day (TID) | ORAL | 3 refills | Status: DC

## 2016-03-27 NOTE — Progress Notes (Signed)
Subjective:    Patient ID: Maria Sweeney, female    DOB: July 23, 1950, 65 y.o.   MRN: [id number]  03/27/2016  Follow-up (Withdraws form stoping Klonipin )   HPI  Presented to ED on 03/19/16 for anxiety, nausea after stopping/weaning Klonopin. At prior visit in 02/2016, added Wellbutrin and advised to decrease Klonopin 1mg  to 1/2 tablet daily for three months.  On 03/21/2016, presented for follow-up with PA McVey; increased Prozac to 60mg  daily; recommended restarting Klonopin to 1/3 pill daily for one week and then 1/4 tablet qod for one week and then PRN.  Pt very reluctant to restart Klonopin at that visit.   Two weeks ago, took last Klonopin seventeen days ago.  Realized that weaned too quickly; did not restart Klonopin and did not increase Prozac.  Having horrible panic attacks.  Cannot taste or smell.  Blurred vision.  Cannot lay down because has pressure in neck; has numbness in neck.  Pops all the time.  Having numbness L>R numbness; L leg is numb since yesterday.  L side is numb.  R side is weak; has not doing anything.  Has been sitting in house due to sweating and panic attacks; having scary thoughts.  Gets up at 1:00am; what wakes up mouth gets dry; starts choking.  Trying to get breath; throat is numb; cannot hardly swallow.  No eating or drinking; eating really slowly.  No drooling.  Not sure why mouth is dry. Stopped Wellbutrin for the past week.  Numbness in head, neck, arms onset 4-5 days after stopping Klonopin.  Could not taste or smell.  Better during the day than at night.  Afraid to take bath alone.  Left and right leg are feeling numb as well. Sometimes R side as well. Having hot flashes for 15-20 minutes. Went off of Klonopin the weekend of the [employer] shooting at [employer].     Neck popped really badly that morning and problems started.  Muscles in neck get tired.  [place].    Review of Systems  Constitutional: Negative for chills, diaphoresis, fatigue and fever.    HENT: Positive for trouble swallowing and voice change.   Eyes: Positive for visual disturbance.  Respiratory: Negative for cough and shortness of breath.   Cardiovascular: Negative for chest pain, palpitations and leg swelling.  Gastrointestinal: Negative for abdominal pain, constipation, diarrhea, nausea and vomiting.  Endocrine: Negative for cold intolerance, heat intolerance, polydipsia, polyphagia and polyuria.  Genitourinary: Negative for difficulty urinating.  Musculoskeletal: Positive for neck pain and neck stiffness.  Neurological: Positive for numbness. Negative for dizziness, tremors, seizures, syncope, facial asymmetry, speech difficulty, weakness, light-headedness and headaches.  Psychiatric/Behavioral: Positive for dysphoric mood and sleep disturbance. Negative for self-injury. The patient is nervous/anxious.     Past Medical History:  Diagnosis Date  . Anxiety   . Depression   . GERD (gastroesophageal reflux disease)   . Hyperlipidemia   . Hypertension   . IBS (irritable bowel syndrome)    Constipation with diarrhea.  . Oral herpes    Past Surgical History:  Procedure Laterality Date  . ABDOMINAL HYSTERECTOMY  1978   DUB; ovaries intact.  . APPENDECTOMY  1988  . BREAST BIOPSY Bilateral 2009   Calcium Deposit- 1980's and also in 2009/ Right side 1995  . COLONOSCOPY  05/11/2009   Normal.  Previous colonoscopy with polyps. [facility].    . DEBRIDEMENT TENNIS ELBOW    . ROTATOR CUFF REPAIR     Allergies  Allergen Reactions  .  Amoxicillin   . Augmentin [Amoxicillin-Pot Clavulanate]   . Codeine   . Depakote [Divalproex Sodium]   . Latex   . Lipitor [Atorvastatin]   . Penicillins   . Septra [Sulfamethoxazole-Trimethoprim]   . Zocor [Simvastatin]     Social History   Social History  . Marital status: Divorced    Spouse name: N/A  . Number of children: 2  . Years of education: N/A   Occupational History  . SALES ASSOCIATE Maria Sweeney    also works at [employer]    Social History Main Topics  . Smoking status: Never Smoker  . Smokeless tobacco: Never Used  . Alcohol use 0.6 oz/week    1 Glasses of wine per week     Comment: rare alcohol use; once per year on average.  . Drug use: No  . Sexual activity: Yes    Partners: Male    Birth control/ protection: Surgical   Other Topics Concern  . Not on file   Social History Narrative   Marital status: divorced since 1999; dating.      Children: one son, one daughter; 2 grandchildren.      Lives: alone     Employment:  part-time jobs at  [employer].      Tobacco: none      Alcohol: rare      Drugs: none      Exercise:  sporadic   Family History  Problem Relation Age of Onset  . Hyperlipidemia Mother   . Hypertension Mother   . Cancer Mother     lung  . Diabetes Mother   . Glaucoma Mother   . Stroke Mother 39  . Heart disease Mother   . Asthma Father   . Emphysema Father   . COPD Father   . Glaucoma Father   . Hypertension Sister        Objective:    BP 122/84   Pulse 72   Temp 98.2 F (36.8 C) (Oral)   Resp 16   Ht 5\' 1"  (1.549 m)   Wt 128 lb (58.1 kg)   SpO2 98%   BMI 24.19 kg/m  Physical Exam  Constitutional: She is oriented to person, place, and time. She appears well-developed and well-nourished. No distress.  HENT:  Head: Normocephalic and atraumatic.  Right Ear: External ear normal.  Left Ear: External ear normal.  Nose: Nose normal.  Mouth/Throat: Oropharynx is clear and moist.  Eyes: Conjunctivae and EOM are normal. Pupils are equal, round, and reactive to light.  Neck: Normal range of motion. Neck supple. Carotid bruit is not present. No thyromegaly present.  Cardiovascular: Normal rate, regular rhythm, normal heart sounds and intact distal pulses.  Exam reveals no gallop and no friction rub.   No murmur heard. Pulmonary/Chest: Effort normal and breath sounds normal. She has no wheezes. She has no rales.  Abdominal: Soft. Bowel sounds are  normal. She exhibits no distension and no mass. There is no tenderness. There is no rebound and no guarding.  Musculoskeletal:       Cervical back: She exhibits tenderness, pain and spasm. She exhibits normal range of motion, no bony tenderness and normal pulse.  Lymphadenopathy:    She has no cervical adenopathy.  Neurological: She is alert and oriented to person, place, and time. No cranial nerve deficit. She exhibits normal muscle tone. Coordination normal.  Skin: Skin is warm and dry. No rash noted. She is not diaphoretic. No erythema. No pallor.  Psychiatric:  Her speech is normal and behavior is normal. Judgment and thought content normal. Her mood appears anxious. Cognition and memory are normal.        Assessment & Plan:   1. Paresthesias   2. Hypokalemia   3. Anxiety and depression   4. Degenerative disc disease, cervical   5. Gastroesophageal reflux disease without esophagitis   6. Insomnia due to other mental disorder    -New onset of paresthesias and worsening anxiety following weaning off of benzos and with social stressors.  Paresthesias started with popping in neck; chronic DDD cervical spine. -obtain labs; normal neurological exam in office. -add Gabapentin 100mg  tid for anxiety and paresthesias. -refer to neurology to rule out neurological process to paresthesias.  -will warrant MRI cervical spine and possibly MRI brain; pt and daughter desire to treat anxiety with Gabapentin and to see neurology; agreeable; if symptoms progress/worsen, will warrant MRI cervical spine and brain. -if acutely declines neurologically, to ED immediately. -close follow-up; pt does not desire to restart Klonopin and undergo a slower wean.   Orders Placed This Encounter  Procedures  . CBC with Differential/Platelet  . Comprehensive metabolic panel  . Ambulatory referral to Neurology    Referral Priority:   Routine    Referral Type:   Consultation    Referral Reason:   Specialty Services  Required    Requested Specialty:   Neurology    Number of Visits Requested:   1   Meds ordered this encounter  Medications  . ranitidine (ZANTAC) 150 MG tablet    Sig: Take 150 mg by mouth at bedtime.  . gabapentin (NEURONTIN) 100 MG capsule    Sig: Take 1-2 capsules (100-200 mg total) by mouth 3 (three) times daily.    Dispense:  90 capsule    Refill:  3    No Follow-up on file.   Judd Mccubbin Elayne Guerin, M.D. [facility] [address] [place], [place]  [address] [phone] phone [phone] fax

## 2016-03-27 NOTE — Patient Instructions (Signed)
     IF you received an x-ray today, you will receive an invoice from [facility]. Please contact [facility] at [phone] with questions or concerns regarding your invoice.   IF you received labwork today, you will receive an invoice from [facility]. Please contact [facility] at [phone] with questions or concerns regarding your invoice.   Our billing staff will not be able to assist you with questions regarding bills from these companies.  You will be contacted with the lab results as soon as they are available. The fastest way to get your results is to activate your My Chart account. Instructions are located on the last page of this paperwork. If you have not heard from us regarding the results in 2 weeks, please contact this office.      

## 2016-04-01 ENCOUNTER — Ambulatory Visit (INDEPENDENT_AMBULATORY_CARE_PROVIDER_SITE_OTHER): Payer: PPO | Admitting: Family Medicine

## 2016-04-01 ENCOUNTER — Encounter: Payer: Self-pay | Admitting: Family Medicine

## 2016-04-01 VITALS — BP 170/90 | HR 78 | Temp 98.1°F | Resp 16 | Ht 60.5 in | Wt 128.6 lb

## 2016-04-01 DIAGNOSIS — R202 Paresthesia of skin: Secondary | ICD-10-CM

## 2016-04-01 DIAGNOSIS — F32A Depression, unspecified: Secondary | ICD-10-CM

## 2016-04-01 DIAGNOSIS — F419 Anxiety disorder, unspecified: Principal | ICD-10-CM

## 2016-04-01 DIAGNOSIS — K219 Gastro-esophageal reflux disease without esophagitis: Secondary | ICD-10-CM

## 2016-04-01 DIAGNOSIS — F418 Other specified anxiety disorders: Secondary | ICD-10-CM

## 2016-04-01 DIAGNOSIS — M5030 Other cervical disc degeneration, unspecified cervical region: Secondary | ICD-10-CM

## 2016-04-01 DIAGNOSIS — F329 Major depressive disorder, single episode, unspecified: Secondary | ICD-10-CM

## 2016-04-01 MED ORDER — METHOCARBAMOL 500 MG PO TABS: ORAL_TABLET | ORAL | 2 refills | Status: DC

## 2016-04-01 NOTE — Patient Instructions (Addendum)
1. Increase Gabapentin 100mg  two tablets three times daily. 2.  Restart methocarbamol one tablet three times daily as needed for muscle spasms.    IF you received an x-ray today, you will receive an invoice from [facility]. Please contact [facility] at [phone] with questions or concerns regarding your invoice.   IF you received labwork today, you will receive an invoice from [facility]. Please contact [facility] at [phone] with questions or concerns regarding your invoice.   Our billing staff will not be able to assist you with questions regarding bills from these companies.  You will be contacted with the lab results as soon as they are available. The fastest way to get your results is to activate your My Chart account. Instructions are located on the last page of this paperwork. If you have not heard from [place] regarding the results in 2 weeks, please contact this office.

## 2016-04-01 NOTE — Progress Notes (Signed)
Subjective:    Patient ID: Maria Sweeney, female    DOB: Apr 06, 1951, 65 y.o.   MRN: [id number]  04/01/2016  Medication Problem (WITH KLONOPIN x 3 weeks)   HPI This 65 y.o. female presents for five day follow-up of panic attacks, GERD uncontrolled, paresthesias, worsening neck pain. S/p labs last visit and all normal. Started on Neurontin 135m tid and Zantac 1567mqhs. Still L sided numbness; still no taste.  Slept in bed Sunday night for the first time. Monday night did better but not as well as Sunday night. Did get some sleep Monday night. Slept all night Sunday.  If lays on L side, gets numb on L. If sleeps on R side, R side goes numb.  Still struggling with sensations all over body; no urge to urinate.  Still numb on L; no change. Friday was really bad; took Neurontin two doses; had horrible neck pain during the night; pain worse; applied heat and massaged; took Aleve without improvement.No pain since Friday. Was jerking a lot due to muscle spasms.  Yesterday no muslce spasms.  Today more anxious. Mild headache this morning.  Radiates to front.  Slept some this morning; woke up at 6:00am due to nervousness.  Able to take bath last night without panic attack.  Numbness is still driving crazy. Numbness has not worsened but has not improved; numbness will go away temporarily.  Dry mouth is better.  Still struggles to swallow some.  Starting getting a little jittery.  Per daughter, eyes did not look so wild.  Did take Gabapentin this morning but missed Gabapentin last night. Vision continues to be gray.  Cannot taste food.  Cannot feel air in mouth when breathing in.  Had severe neck pain five days ago at nighttime; has not suffered with recurrent neck pain since that time.  Has not been taking Robaxin; needs to get refill from pharmacy.  No Klonopin in three weeks.  Gabapentin 10013mne three times per day.  Does not feel a lot from it; no drowsy feeling.  Did not take one last night because fell  asleep.  Thinks it calms pt.  Takes Gabapentin every morning and then 5;00pm and bedtime at 11:00pm.  Zantac 150m33ms; last night was the first night belched with regurg.  Has not been able to tell lately; throat has been sore/irritated.  Ate pizza last night; wears out to eate.  Hates taste of water; has bad taste in mouth but cannot taste food.  Must have something with flavor.    Fluoxetine 20mg51m daily.  Wt Readings from Last 3 Encounters:  04/01/16 128 lb 9.6 oz (58.3 kg)  03/27/16 128 lb (58.1 kg)  03/21/16 127 lb (57.6 kg)   BP Readings from Last 3 Encounters:  04/01/16 (!) 170/90  03/27/16 122/84  03/21/16 130/88   Depression screen PHQ 2/9 04/01/2016 03/21/2016 03/21/2016 02/11/2016 04/26/2015  Decreased Interest 1 0 0 3 3  Down, Depressed, Hopeless 3 0 0 1 0  PHQ - 2 Score 4 0 0 4 3  Altered sleeping 3 - - 2 3  Tired, decreased energy 3 - - 3 3  Change in appetite 0 - - 2 0  Feeling bad or failure about yourself  2 - - 2 2  Trouble concentrating 3 - - 1 0  Moving slowly or fidgety/restless 3 - - 1 0  Suicidal thoughts 0 - - 0 0  PHQ-9 Score 18 - - 15 11   GAD 7 :  Generalized Anxiety Score 04/01/2016 04/26/2015  Nervous, Anxious, on Edge 3 1  Control/stop worrying 3 2  Worry too much - different things 3 3  Trouble relaxing 3 3  Restless 3 0  Easily annoyed or irritable 2 0  Afraid - awful might happen 2 1  Total GAD 7 Score 19 10  Anxiety Difficulty Not difficult at all Not difficult at all     Review of Systems  Constitutional: Negative for chills, diaphoresis, fatigue and fever.  Eyes: Negative for visual disturbance.  Respiratory: Negative for cough and shortness of breath.   Cardiovascular: Negative for chest pain, palpitations and leg swelling.  Gastrointestinal: Negative for abdominal pain, constipation, diarrhea, nausea and vomiting.  Endocrine: Negative for cold intolerance, heat intolerance, polydipsia, polyphagia and polyuria.  Neurological:  Negative for dizziness, tremors, seizures, syncope, facial asymmetry, speech difficulty, weakness, light-headedness, numbness and headaches.    Past Medical History:  Diagnosis Date  . Anxiety   . Depression   . GERD (gastroesophageal reflux disease)   . Hyperlipidemia   . Hypertension   . IBS (irritable bowel syndrome)    Constipation with diarrhea.  . Oral herpes    Past Surgical History:  Procedure Laterality Date  . ABDOMINAL HYSTERECTOMY  1978   DUB; ovaries intact.  . APPENDECTOMY  1988  . BREAST BIOPSY Bilateral 2009   Calcium Deposit- 1980's and also in 2009/ Right side 1995  . COLONOSCOPY  05/11/2009   Normal.  Previous colonoscopy with polyps. [facility].    . DEBRIDEMENT TENNIS ELBOW    . ROTATOR CUFF REPAIR     Allergies  Allergen Reactions  . Amoxicillin   . Augmentin [Amoxicillin-Pot Clavulanate]   . Codeine   . Depakote [Divalproex Sodium]   . Latex   . Lipitor [Atorvastatin]   . Penicillins   . Septra [Sulfamethoxazole-Trimethoprim]   . Zocor [Simvastatin]    Current Outpatient Prescriptions  Medication Sig Dispense Refill  . albuterol (PROVENTIL HFA;VENTOLIN HFA) 108 (90 Base) MCG/ACT inhaler Inhale 2 puffs into the lungs every 4 (four) hours as needed for wheezing or shortness of breath (cough, shortness of breath or wheezing.). 1 Inhaler 1  . FLUoxetine (PROZAC) 20 MG capsule Take 2 capsules (40 mg total) by mouth daily. 60 capsule 5  . gabapentin (NEURONTIN) 100 MG capsule Take 1-2 capsules (100-200 mg total) by mouth 3 (three) times daily. 90 capsule 3  . metoprolol succinate (TOPROL-XL) 25 MG 24 hr tablet Take 1 tablet (25 mg total) by mouth daily. 30 tablet 5  . pantoprazole (PROTONIX) 40 MG tablet Take 1 tablet (40 mg total) by mouth daily. 30 tablet 11  . pravastatin (PRAVACHOL) 40 MG tablet Take 1 tablet (40 mg total) by mouth daily. 30 tablet 5  . ranitidine (ZANTAC) 150 MG tablet Take 150 mg by mouth at bedtime.    Marland Kitchen spironolactone (ALDACTONE) 50  MG tablet Take 1 tablet (50 mg total) by mouth daily. 30 tablet 5  . buPROPion (WELLBUTRIN XL) 150 MG 24 hr tablet Take 1 tablet (150 mg total) by mouth daily. (Patient not taking: Reported on 04/01/2016) 30 tablet 5  . clonazePAM (KLONOPIN) 1 MG tablet TAKE ONE HALF TABLET DAILY PRN (Patient not taking: Reported on 04/01/2016) 30 tablet 3  . methocarbamol (ROBAXIN) 500 MG tablet TAKE ONE OR TWO TABLETS AT BEDTIME 60 tablet 2  . valACYclovir (VALTREX) 1000 MG tablet Take 1 tablet (1,000 mg total) by mouth 2 (two) times daily. 30 tablet 1   No current  facility-administered medications for this visit.    Social History   Social History  . Marital status: Divorced    Spouse name: N/A  . Number of children: 2  . Years of education: N/A   Occupational History  . 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   Social History Main Topics  . Smoking status: Never Smoker  . Smokeless tobacco: Never Used  . Alcohol use 0.6 oz/week    1 Glasses of wine per week     Comment: rare alcohol use; once per year on average.  . Drug use: No  . Sexual activity: Yes    Partners: Male    Birth control/ protection: Surgical   Other Topics Concern  . Not on file   Social History Narrative   Marital status: divorced since 1999; dating.      Children: one son, one daughter; 2 grandchildren.      Lives: alone     Employment:  part-time jobs at  [employer].      Tobacco: none      Alcohol: rare      Drugs: none      Exercise:  sporadic   Family History  Problem Relation Age of Onset  . Hyperlipidemia Mother   . Hypertension Mother   . Cancer Mother     lung  . Diabetes Mother   . Glaucoma Mother   . Stroke Mother 64  . Heart disease Mother   . Asthma Father   . Emphysema Father   . COPD Father   . Glaucoma Father   . Hypertension Sister        Objective:    BP (!) 170/90 (BP Location: Left Arm, Patient Position: Sitting, Cuff Size: Normal)   Pulse 78   Temp  98.1 F (36.7 C) (Oral)   Resp 16   Ht 5' 0.5" (1.537 m)   Wt 128 lb 9.6 oz (58.3 kg)   SpO2 96%   BMI 24.70 kg/m  Physical Exam  Constitutional: She is oriented to person, place, and time. She appears well-developed and well-nourished. No distress.  HENT:  Head: Normocephalic and atraumatic.  Right Ear: External ear normal.  Left Ear: External ear normal.  Nose: Nose normal.  Mouth/Throat: Oropharynx is clear and moist.  Eyes: Conjunctivae and EOM are normal. Pupils are equal, round, and reactive to light.  Neck: Normal range of motion. Neck supple. Carotid bruit is not present. No thyromegaly present.  Cardiovascular: Normal rate, regular rhythm, normal heart sounds and intact distal pulses.  Exam reveals no gallop and no friction rub.   No murmur heard. Pulmonary/Chest: Effort normal and breath sounds normal. She has no wheezes. She has no rales.  Abdominal: Soft. Bowel sounds are normal. She exhibits no distension and no mass. There is no tenderness. There is no rebound and no guarding.  Lymphadenopathy:    She has no cervical adenopathy.  Neurological: She is alert and oriented to person, place, and time. No cranial nerve deficit. She exhibits normal muscle tone. Coordination normal.  Skin: Skin is warm and dry. No rash noted. She is not diaphoretic. No erythema. No pallor.  Psychiatric: Her speech is normal and behavior is normal. Judgment and thought content normal. Her mood appears anxious. Cognition and memory are normal.   Results for orders placed or performed in visit on 03/27/16  CBC with Differential/Platelet  Result Value Ref Range   WBC 9.1 3.8 - 10.8 K/uL  RBC 4.43 3.80 - 5.10 MIL/uL   Hemoglobin 14.2 11.7 - 15.5 g/dL   HCT 40.8 35.0 - 45.0 %   MCV 92.1 80.0 - 100.0 fL   MCH 32.1 27.0 - 33.0 pg   MCHC 34.8 32.0 - 36.0 g/dL   RDW 13.5 11.0 - 15.0 %   Platelets 332 140 - 400 K/uL   MPV 9.8 7.5 - 12.5 fL   Neutro Abs 6,825 1,500 - 7,800 cells/uL   Lymphs Abs  1,547 850 - 3,900 cells/uL   Monocytes Absolute 637 200 - 950 cells/uL   Eosinophils Absolute 91 15 - 500 cells/uL   Basophils Absolute 0 0 - 200 cells/uL   Neutrophils Relative % 75 %   Lymphocytes Relative 17 %   Monocytes Relative 7 %   Eosinophils Relative 1 %   Basophils Relative 0 %   Smear Review Criteria for review not met   Comprehensive metabolic panel  Result Value Ref Range   Sodium 142 135 - 146 mmol/L   Potassium 4.1 3.5 - 5.3 mmol/L   Chloride 105 98 - 110 mmol/L   CO2 27 20 - 31 mmol/L   Glucose, Bld 103 (H) 65 - 99 mg/dL   BUN 8 7 - 25 mg/dL   Creat 0.89 0.50 - 0.99 mg/dL   Total Bilirubin 0.8 0.2 - 1.2 mg/dL   Alkaline Phosphatase 72 33 - 130 U/L   AST 14 10 - 35 U/L   ALT 16 6 - 29 U/L   Total Protein 7.1 6.1 - 8.1 g/dL   Albumin 4.7 3.6 - 5.1 g/dL   Calcium 10.3 8.6 - 10.4 mg/dL       Assessment & Plan:   1. Anxiety and depression   2. Degenerative disc disease, cervical   3. Paresthesias   4. Gastroesophageal reflux disease without esophagitis    -improving slowly; increase to 2 Gabapentin 144m tid.  -start Robaxin tid PRN. -refer to neurology. -to ED for acute neurological decline.  No orders of the defined types were placed in this encounter.  Meds ordered this encounter  Medications  . methocarbamol (ROBAXIN) 500 MG tablet    Sig: TAKE ONE OR TWO TABLETS AT BEDTIME    Dispense:  60 tablet    Refill:  2    No Follow-up on file.   Symphanie Cederberg MElayne Guerin M.D. [facility] [place]  [phone]

## 2016-04-06 ENCOUNTER — Telehealth: Payer: Self-pay | Admitting: Neurology

## 2016-04-06 NOTE — Telephone Encounter (Signed)
Pt is not a patient of [facility]. Will be a new pt for Patel in Feb. Message is in wrong chart, supposed to be gail coachmans.

## 2016-04-06 NOTE — Telephone Encounter (Signed)
Patient needs to talk to some one about her testing that has been done please cal [phone]

## 2016-04-09 ENCOUNTER — Ambulatory Visit (INDEPENDENT_AMBULATORY_CARE_PROVIDER_SITE_OTHER): Payer: PPO | Admitting: Family Medicine

## 2016-04-09 VITALS — BP 148/82 | HR 75 | Temp 98.5°F | Resp 16 | Wt 131.0 lb

## 2016-04-09 DIAGNOSIS — I10 Essential (primary) hypertension: Secondary | ICD-10-CM | POA: Diagnosis not present

## 2016-04-09 DIAGNOSIS — E7800 Pure hypercholesterolemia, unspecified: Secondary | ICD-10-CM | POA: Diagnosis not present

## 2016-04-09 DIAGNOSIS — F418 Other specified anxiety disorders: Secondary | ICD-10-CM

## 2016-04-09 DIAGNOSIS — R202 Paresthesia of skin: Secondary | ICD-10-CM | POA: Diagnosis not present

## 2016-04-09 DIAGNOSIS — F329 Major depressive disorder, single episode, unspecified: Secondary | ICD-10-CM

## 2016-04-09 DIAGNOSIS — F419 Anxiety disorder, unspecified: Secondary | ICD-10-CM

## 2016-04-09 DIAGNOSIS — R200 Anesthesia of skin: Secondary | ICD-10-CM | POA: Diagnosis not present

## 2016-04-09 DIAGNOSIS — R430 Anosmia: Secondary | ICD-10-CM | POA: Diagnosis not present

## 2016-04-09 DIAGNOSIS — M5030 Other cervical disc degeneration, unspecified cervical region: Secondary | ICD-10-CM

## 2016-04-09 MED ORDER — GABAPENTIN 300 MG PO CAPS: 300.0000 mg | ORAL_CAPSULE | Freq: Three times a day (TID) | ORAL | 1 refills | Status: DC

## 2016-04-09 NOTE — Patient Instructions (Signed)
     IF you received an x-ray today, you will receive an invoice from [facility]. Please contact [facility] at [phone] with questions or concerns regarding your invoice.   IF you received labwork today, you will receive an invoice from [facility]. Please contact [facility] at [phone] with questions or concerns regarding your invoice.   Our billing staff will not be able to assist you with questions regarding bills from these companies.  You will be contacted with the lab results as soon as they are available. The fastest way to get your results is to activate your My Chart account. Instructions are located on the last page of this paperwork. If you have not heard from us regarding the results in 2 weeks, please contact this office.      

## 2016-04-09 NOTE — Progress Notes (Signed)
Subjective:    Patient ID: Maria Sweeney, female    DOB: 1950-10-18, 65 y.o.   MRN: [id number]  04/09/2016  Follow-up (medication withdrawl)   HPI This 65 y.o. female presents for one week follow-up fo anxiety/depression, neck pain, paresthesias.  Still having numbness in mouth mostly on L side; mouth wakes up in the morning and then settles back in.  Feels like a shot of Novacaine; numb in throat.  Gets scared when gets in that area; not that bad in two days.  Then starts breathing heavy; no problems with breathing and swallowing but cannot feel feel breathing or swallowing.  Having a headache at occipital region for the past two days.  Taking Gabapentin 2 tablets three times daily.  Really good weekend; started getting anxious Sunday evening after church. Got a little dizzy and lightheaded in a store; speech has been bad for the past two days.  Eyes will get blurry and dim.  Taking robaxin nighttime only; last night took two due to severe headache; also took two Aleve with improvement; L sided neck pain/headache severe; applied cold cloth with relief.  Had a lot of tension; radiated from neck pain that radiated to top of head.  Numbness is most bothersome symptoms; triggers anxiety.  Called neurology office; February 5 appointment.     Review of Systems  Constitutional: Negative for chills, diaphoresis, fatigue and fever.  Eyes: Negative for visual disturbance.  Respiratory: Negative for cough and shortness of breath.   Cardiovascular: Negative for chest pain, palpitations and leg swelling.  Gastrointestinal: Negative for abdominal pain, constipation, diarrhea, nausea and vomiting.  Endocrine: Negative for cold intolerance, heat intolerance, polydipsia, polyphagia and polyuria.  Musculoskeletal: Positive for neck pain and neck stiffness.  Neurological: Positive for dizziness, numbness and headaches. Negative for tremors, seizures, syncope, facial asymmetry, speech difficulty, weakness and  light-headedness.  Psychiatric/Behavioral: Positive for sleep disturbance. Negative for self-injury and suicidal ideas. The patient is nervous/anxious.     Past Medical History:  Diagnosis Date  . Anxiety   . Cervical spondylosis   . Depression   . GERD (gastroesophageal reflux disease)   . Hyperlipidemia   . Hypertension   . IBS (irritable bowel syndrome)    Constipation with diarrhea.  . Oral herpes    Past Surgical History:  Procedure Laterality Date  . ABDOMINAL HYSTERECTOMY  1978   DUB; ovaries intact.  . APPENDECTOMY  1988  . BREAST BIOPSY Bilateral 2009   Calcium Deposit- 1980's and also in 2009/ Right side 1995  . COLONOSCOPY  05/11/2009   Normal.  Previous colonoscopy with polyps. [facility].    . DEBRIDEMENT TENNIS ELBOW    . ROTATOR CUFF REPAIR     Allergies  Allergen Reactions  . Amoxicillin   . Augmentin [Amoxicillin-Pot Clavulanate]   . Codeine   . Depakote [Divalproex Sodium]   . Latex   . Lipitor [Atorvastatin]   . Penicillins   . Septra [Sulfamethoxazole-Trimethoprim]   . Zocor [Simvastatin]     Social History   Social History  . Marital status: Divorced    Spouse name: N/A  . Number of children: 2  . Years of education: Bachelors   Occupational History  . 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   Social History Main Topics  . Smoking status: Never Smoker  . Smokeless tobacco: Never Used  . Alcohol use 0.6 oz/week    1 Glasses of wine per week     Comment: rare  alcohol use; once per year on average.  . Drug use: No  . Sexual activity: Yes    Partners: Male    Birth control/ protection: Surgical   Other Topics Concern  . Not on file   Social History Narrative   Marital status: divorced since 1999; dating.      Children: one son, one daughter; 2 grandchildren.      Lives: alone     Employment:  part-time jobs at  [employer].      Tobacco: none      Alcohol: rare      Drugs: none      Exercise:  Sporadic    Right-handed.   Occasional caffeine use.   Family History  Problem Relation Age of Onset  . Hyperlipidemia Mother   . Hypertension Mother   . Cancer Mother     lung  . Diabetes Mother   . Glaucoma Mother   . Stroke Mother 37  . Heart disease Mother   . Asthma Father   . Emphysema Father   . COPD Father   . Glaucoma Father   . Hypertension Sister        Objective:    BP (!) 148/82 (BP Location: Left Arm, Cuff Size: Normal)   Pulse 75   Temp 98.5 F (36.9 C) (Oral)   Resp 16   Wt 131 lb (59.4 kg)   SpO2 97%   BMI 25.16 kg/m  Physical Exam  Constitutional: She is oriented to person, place, and time. She appears well-developed and well-nourished. No distress.  HENT:  Head: Normocephalic and atraumatic.  Right Ear: External ear normal.  Left Ear: External ear normal.  Nose: Nose normal.  Mouth/Throat: Oropharynx is clear and moist.  Eyes: Conjunctivae and EOM are normal. Pupils are equal, round, and reactive to light.  Neck: Normal range of motion. Neck supple. Carotid bruit is not present. No thyromegaly present.  Cardiovascular: Normal rate, regular rhythm, normal heart sounds and intact distal pulses.  Exam reveals no gallop and no friction rub.   No murmur heard. Pulmonary/Chest: Effort normal and breath sounds normal. She has no wheezes. She has no rales.  Abdominal: Soft. Bowel sounds are normal. She exhibits no distension and no mass. There is no tenderness. There is no rebound and no guarding.  Musculoskeletal:       Cervical back: She exhibits decreased range of motion, tenderness, bony tenderness, pain and spasm.  Lymphadenopathy:    She has no cervical adenopathy.  Neurological: She is alert and oriented to person, place, and time. No cranial nerve deficit. Coordination normal.  Skin: Skin is warm and dry. No rash noted. She is not diaphoretic. No erythema. No pallor.  Psychiatric: Her behavior is normal. Judgment and thought content normal. Her mood appears  anxious.   Results for orders placed or performed in visit on 03/27/16  CBC with Differential/Platelet  Result Value Ref Range   WBC 9.1 3.8 - 10.8 K/uL   RBC 4.43 3.80 - 5.10 MIL/uL   Hemoglobin 14.2 11.7 - 15.5 g/dL   HCT 40.8 35.0 - 45.0 %   MCV 92.1 80.0 - 100.0 fL   MCH 32.1 27.0 - 33.0 pg   MCHC 34.8 32.0 - 36.0 g/dL   RDW 13.5 11.0 - 15.0 %   Platelets 332 140 - 400 K/uL   MPV 9.8 7.5 - 12.5 fL   Neutro Abs 6,825 1,500 - 7,800 cells/uL   Lymphs Abs 1,547 850 - 3,900  cells/uL   Monocytes Absolute 637 200 - 950 cells/uL   Eosinophils Absolute 91 15 - 500 cells/uL   Basophils Absolute 0 0 - 200 cells/uL   Neutrophils Relative % 75 %   Lymphocytes Relative 17 %   Monocytes Relative 7 %   Eosinophils Relative 1 %   Basophils Relative 0 %   Smear Review Criteria for review not met   Comprehensive metabolic panel  Result Value Ref Range   Sodium 142 135 - 146 mmol/L   Potassium 4.1 3.5 - 5.3 mmol/L   Chloride 105 98 - 110 mmol/L   CO2 27 20 - 31 mmol/L   Glucose, Bld 103 (H) 65 - 99 mg/dL   BUN 8 7 - 25 mg/dL   Creat 0.89 0.50 - 0.99 mg/dL   Total Bilirubin 0.8 0.2 - 1.2 mg/dL   Alkaline Phosphatase 72 33 - 130 U/L   AST 14 10 - 35 U/L   ALT 16 6 - 29 U/L   Total Protein 7.1 6.1 - 8.1 g/dL   Albumin 4.7 3.6 - 5.1 g/dL   Calcium 10.3 8.6 - 10.4 mg/dL       Assessment & Plan:   1. Paresthesias   2. Degenerative disc disease, cervical   3. Anxiety and depression   4. Loss of smell   5. Numbness   6. Pure hypercholesterolemia   7. Essential hypertension, benign    -persistent neck pain with paresthesias L sided with vision changes.  Contact neurology for sooner appointment. Neurology to see patient on 04/13/16. -obtain MRI brain and MRI cervical spine.  -increase Gabapentin to '300mg'$  tid. -to ED for acute neurological decline.   Orders Placed This Encounter  Procedures  . MR Brain Wo Contrast    Standing Status:   Future    Standing Expiration Date:    06/09/2017    Order Specific Question:   Reason for Exam (SYMPTOM  OR DIAGNOSIS REQUIRED)    Answer:   neck pain, paresthesias L side of body for one month; dysphagia    Order Specific Question:   Preferred imaging location?    Answer:   [facility] (table limit-350lbs)    Order Specific Question:   What is the patient's sedation requirement?    Answer:   No Sedation    Order Specific Question:   Does the patient have a pacemaker or implanted devices?    Answer:   No  . MR Cervical Spine Wo Contrast    Standing Status:   Future    Standing Expiration Date:   06/09/2017    Order Specific Question:   Reason for Exam (SYMPTOM  OR DIAGNOSIS REQUIRED)    Answer:   neck pain with numbness L side    Order Specific Question:   Preferred imaging location?    Answer:   [facility] (table limit-350lbs)    Order Specific Question:   What is the patient's sedation requirement?    Answer:   No Sedation    Order Specific Question:   Does the patient have a pacemaker or implanted devices?    Answer:   No   Meds ordered this encounter  Medications  . gabapentin (NEURONTIN) 300 MG capsule    Sig: Take 1 capsule (300 mg total) by mouth 3 (three) times daily.    Dispense:  90 capsule    Refill:  1    Return in about 1 week (around 04/16/2016) for recheck.   Cas Tracz Elayne Guerin, M.D. [facility] [address] [place], [place]  [address] [phone] phone [phone] fax

## 2016-04-13 ENCOUNTER — Encounter: Payer: Self-pay | Admitting: Neurology

## 2016-04-13 ENCOUNTER — Ambulatory Visit (INDEPENDENT_AMBULATORY_CARE_PROVIDER_SITE_OTHER): Payer: PPO | Admitting: Neurology

## 2016-04-13 ENCOUNTER — Encounter: Payer: Self-pay | Admitting: Family Medicine

## 2016-04-13 VITALS — BP 147/83 | HR 69 | Ht 60.5 in | Wt 131.2 lb

## 2016-04-13 DIAGNOSIS — R200 Anesthesia of skin: Secondary | ICD-10-CM | POA: Insufficient documentation

## 2016-04-13 DIAGNOSIS — F99 Mental disorder, not otherwise specified: Secondary | ICD-10-CM

## 2016-04-13 DIAGNOSIS — F5105 Insomnia due to other mental disorder: Secondary | ICD-10-CM

## 2016-04-13 DIAGNOSIS — F329 Major depressive disorder, single episode, unspecified: Secondary | ICD-10-CM

## 2016-04-13 DIAGNOSIS — F32A Depression, unspecified: Secondary | ICD-10-CM

## 2016-04-13 DIAGNOSIS — F418 Other specified anxiety disorders: Secondary | ICD-10-CM | POA: Diagnosis not present

## 2016-04-13 DIAGNOSIS — R430 Anosmia: Secondary | ICD-10-CM | POA: Diagnosis not present

## 2016-04-13 DIAGNOSIS — F419 Anxiety disorder, unspecified: Secondary | ICD-10-CM

## 2016-04-13 MED ORDER — BUSPIRONE HCL 15 MG PO TABS: 15.0000 mg | ORAL_TABLET | Freq: Two times a day (BID) | ORAL | 5 refills | Status: DC

## 2016-04-13 NOTE — Progress Notes (Signed)
[facility]  PATIENT: Maria Sweeney DOB: 1951/04/11  REFERRING DOCTOR OR PCP:  Reginia Forts SOURCE: patient,   _________________________________   HISTORICAL  CHIEF COMPLAINT:  Chief Complaint  Patient presents with  . Paresthesias    She is here with her daughter, Missy.  Reports left-sided numbness, loss of taste, loss of smell, stuttering speech, ringing in ears and blurred vision.  She noticed these symptoms following the discontinuation of Klonopin on 03/09/16, after being on it for 22 years for severe anxiety.    HISTORY OF PRESENT ILLNESS:  I had the pleasure seeing you patient, Maria Sweeney, at [facility] for neurologic consultation.   She is a 66 year old woman with paresthesias,  loss of taste, loss of smell and other symptoms   She reports that she had been on clonazepam 22 years for anxiety. Her dose was 1 mg daily and she weaned herself off, discontinuing 03/09/2016.   She did a 1/2 pill x 2 weeks, then 1/2 pill qod x 2 weeks taper.  Her dose of clonazepam was 1 mg once or twice a day (usually took one in the mornings and occasionally a second dose).   She stopped it to try to get off of an addictive medication.    For the first few days after stopping, she felt vertigo and lightheadedness.  About 5 days after stopping the medication, she had loss of taste and smell sense.   She notes numbness on the left side of her body that has been fairly constant and some milder fluctuating right sided numbness.    The numbness is partial on the left when she wakes up but seems worse after a few minutes.    The head, arm, trunk and leg are involved.   She notes numbness in her throat and feels swallowing is off.    She has a mildy uncomfortable tingling on the left as well, improved since starting gabapentin.     She noted that the sense of smell and taste occurred about 1 month ago. She feels the loss of smell and taste is about the same as it  was a few weeks ago. She can tell the difference between sweet and sour and salty.   She does not recall any URI before the loss of smell.   However, she had some dizziness and was told at a walk-in clinic that there was some fluid behind the one ear and Flonase was prescribed.   She experienced some ringing in both ears and the right side is better but the left is similar.    Dr. Tamala Julian recently looked in her ears and there was no fluid more recently.     She has had a few headaches in the left occiput that radiated forward.   The headaches lasted until she fell asleep.   She had some milder but similar headaches in the past.     She was already on Prozac 40 mg when she tapered the clonazepam.  Gabapentin was added 2 weeks ago and increased to 300 mg tid a few days ago.     REVIEW OF SYSTEMS: Constitutional: No fevers, chills, sweats, or change in appetite Eyes: No visual changes, double vision, eye pain Ear, nose and throat: She noted some ringing in her ears. She has had loss of sense of smell and taste. Cardiovascular: No chest pain, palpitations Respiratory: No shortness of breath at rest or with exertion.   No wheezes GastrointestinaI: No nausea, vomiting, diarrhea,  abdominal pain, fecal incontinence Genitourinary: No dysuria, urinary retention or frequency.  No nocturia. Musculoskeletal: No neck pain, back pain Integumentary: No rash, pruritus, skin lesions Neurological: as above Psychiatric: No depression at this time.  Notes anxiety Endocrine: No palpitations, diaphoresis, change in appetite, change in weigh or increased thirst Hematologic/Lymphatic: No anemia, purpura, petechiae. Allergic/Immunologic: No itchy/runny eyes, nasal congestion, recent allergic reactions, rashes  ALLERGIES: Allergies  Allergen Reactions  . Amoxicillin   . Augmentin [Amoxicillin-Pot Clavulanate]   . Codeine   . Depakote [Divalproex Sodium]   . Latex   . Lipitor [Atorvastatin]   . Penicillins   .  Septra [Sulfamethoxazole-Trimethoprim]   . Zocor [Simvastatin]     HOME MEDICATIONS:  Current Outpatient Prescriptions:  .  albuterol (PROVENTIL HFA;VENTOLIN HFA) 108 (90 Base) MCG/ACT inhaler, Inhale 2 puffs into the lungs every 4 (four) hours as needed for wheezing or shortness of breath (cough, shortness of breath or wheezing.)., Disp: 1 Inhaler, Rfl: 1 .  FLUoxetine (PROZAC) 20 MG capsule, Take 2 capsules (40 mg total) by mouth daily., Disp: 60 capsule, Rfl: 5 .  gabapentin (NEURONTIN) 300 MG capsule, Take 1 capsule (300 mg total) by mouth 3 (three) times daily., Disp: 90 capsule, Rfl: 1 .  methocarbamol (ROBAXIN) 500 MG tablet, TAKE ONE OR TWO TABLETS AT BEDTIME, Disp: 60 tablet, Rfl: 2 .  metoprolol succinate (TOPROL-XL) 25 MG 24 hr tablet, Take 1 tablet (25 mg total) by mouth daily., Disp: 30 tablet, Rfl: 5 .  pantoprazole (PROTONIX) 40 MG tablet, Take 1 tablet (40 mg total) by mouth daily., Disp: 30 tablet, Rfl: 11 .  pravastatin (PRAVACHOL) 40 MG tablet, Take 1 tablet (40 mg total) by mouth daily., Disp: 30 tablet, Rfl: 5 .  ranitidine (ZANTAC) 150 MG tablet, Take 150 mg by mouth at bedtime., Disp: , Rfl:  .  spironolactone (ALDACTONE) 50 MG tablet, Take 1 tablet (50 mg total) by mouth daily., Disp: 30 tablet, Rfl: 5 .  valACYclovir (VALTREX) 1000 MG tablet, Take 1 tablet (1,000 mg total) by mouth 2 (two) times daily., Disp: 30 tablet, Rfl: 1  PAST MEDICAL HISTORY: Past Medical History:  Diagnosis Date  . Anxiety   . Cervical spondylosis   . Depression   . GERD (gastroesophageal reflux disease)   . Hyperlipidemia   . Hypertension   . IBS (irritable bowel syndrome)    Constipation with diarrhea.  . Oral herpes     PAST SURGICAL HISTORY: Past Surgical History:  Procedure Laterality Date  . ABDOMINAL HYSTERECTOMY  1978   DUB; ovaries intact.  . APPENDECTOMY  1988  . BREAST BIOPSY Bilateral 2009   Calcium Deposit- 1980's and also in 2009/ Right side 1995  . COLONOSCOPY   05/11/2009   Normal.  Previous colonoscopy with polyps. [facility].    . DEBRIDEMENT TENNIS ELBOW    . ROTATOR CUFF REPAIR      FAMILY HISTORY: Family History  Problem Relation Age of Onset  . Hyperlipidemia Mother   . Hypertension Mother   . Cancer Mother     lung  . Diabetes Mother   . Glaucoma Mother   . Stroke Mother 55  . Heart disease Mother   . Asthma Father   . Emphysema Father   . COPD Father   . Glaucoma Father   . Hypertension Sister     SOCIAL HISTORY:  Social History   Social History  . Marital status: Divorced    Spouse name: N/A  . Number of children: 2  .  Years of education: Bachelors   Occupational History  . 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   Social History Main Topics  . Smoking status: Never Smoker  . Smokeless tobacco: Never Used  . Alcohol use 0.6 oz/week    1 Glasses of wine per week     Comment: rare alcohol use; once per year on average.  . Drug use: No  . Sexual activity: Yes    Partners: Male    Birth control/ protection: Surgical   Other Topics Concern  . Not on file   Social History Narrative   Marital status: divorced since 1999; dating.      Children: one son, one daughter; 2 grandchildren.      Lives: alone     Employment:  part-time jobs at  [employer].      Tobacco: none      Alcohol: rare      Drugs: none      Exercise:  Sporadic   Right-handed.   Occasional caffeine use.     PHYSICAL EXAM  Vitals:   04/13/16 1115  BP: (!) 147/83  Pulse: 69  Weight: 131 lb 4 oz (59.5 kg)  Height: 5' 0.5" (1.537 m)    Body mass index is 25.21 kg/m.   General: The patient is well-developed and well-nourished and in no acute distress  HEENT:  Funduscopic exam shows normal optic discs and retinal vessels.   Head is [place]/AT.   Tympanic membranes intact.  Pharynx is non-erythematous.    Neck: The neck is supple, no carotid bruits are noted.  The neck is mildly tender at the left  occiput.  Cardiovascular: The heart has a regular rate and rhythm with a normal S1 and S2. There were no murmurs, gallops or rubs. Lungs are clear to auscultation.  Skin: Extremities are without significant edema.  Musculoskeletal:  Back is nontender  Neurologic Exam  Mental status: The patient is alert and oriented x 3 at the time of the examination. The patient has apparent normal recent and remote memory, with an apparently normal attention span and concentration ability.   Speech is normal.  Cranial nerves: Extraocular movements are full. Pupils are equal, round, and reactive to light and accomodation.  Visual fields are full.  Facial symmetry is present. There is good mild reduced sensation to soft touch on the left.Facial strength is normal.  Trapezius and sternocleidomastoid strength is normal. No dysarthria is noted.  The tongue is midline, and the patient has symmetric elevation of the soft palate. Hearing was slightly reduced on hr left and hte Weber lateralized to the right slightly   Motor:  Muscle bulk is normal.   Tone is normal. Strength is  5 / 5 in all 4 extremities.   Sensory: She reported symmetric arm vibration but reduced touch/temp on the left arm and reduced vibration/touch/temp in left leg.    Coordination: Cerebellar testing reveals good finger-nose-finger and heel-to-shin bilaterally.  Gait and station: Station is normal.   Gait is normal. Tandem gait is mildly wide. Romberg is negative.   Reflexes: Deep tendon reflexes are symmetric and normal bilaterally.   Plantar responses are flexor.    DIAGNOSTIC DATA (LABS, IMAGING, TESTING) - I reviewed patient records, labs, notes, testing and imaging myself where available.  Lab Results  Component Value Date   WBC 9.1 03/27/2016   HGB 14.2 03/27/2016   HCT 40.8 03/27/2016   MCV 92.1 03/27/2016   PLT 332  03/27/2016      Component Value Date/Time   NA 142 03/27/2016 1110   K 4.1 03/27/2016 1110   CL 105  03/27/2016 1110   CO2 27 03/27/2016 1110   GLUCOSE 103 (H) 03/27/2016 1110   BUN 8 03/27/2016 1110   CREATININE 0.89 03/27/2016 1110   CALCIUM 10.3 03/27/2016 1110   PROT 7.1 03/27/2016 1110   ALBUMIN 4.7 03/27/2016 1110   AST 14 03/27/2016 1110   ALT 16 03/27/2016 1110   ALKPHOS 72 03/27/2016 1110   BILITOT 0.8 03/27/2016 1110   GFRNONAA 58 (L) 03/19/2016 1317   GFRNONAA 86 10/18/2013 1528   GFRAA >60 03/19/2016 1317   GFRAA >89 10/18/2013 1528   Lab Results  Component Value Date   CHOL 197 02/11/2016   HDL 55 02/11/2016   LDLCALC 99 02/11/2016   TRIG 213 (H) 02/11/2016   CHOLHDL 3.6 02/11/2016   Lab Results  Component Value Date   HGBA1C 5.6 02/11/2016   No results found for: [id number] Lab Results  Component Value Date   TSH 3.506 03/19/2016       ASSESSMENT AND PLAN  Numbness - Plan: MR BRAIN W WO CONTRAST  Loss of smell - Plan: MR BRAIN W WO CONTRAST  Anxiety and depression  Insomnia due to other mental disorder   In summary, Maria Sweeney is a 65 year old woman who had the onset of left-sided numbness and loss of taste and smell sensation about one month ago. Symptoms started after she weaned herself off of clonazepam. Symptoms of numbness have been relatively stable over the past few weeks. She has some taste and most likely the altered taste is due more to the loss of sensation of smell.    Because of the numbness and loss of smell, we need to obtain an MRI of the brain with and without contrast to determine if there has been any ischemic changes, demyelination or mass lesion such as cribriform plate meningioma.   Further testing or intervention may be necessary based on the results.     She also has had more anxiety since stopping clonazepam. I will have her start BuSpar 15 mg by mouth twice a day and we can increase this if she gets a partial improvement.  She will continue the fluoxetine and gabapentin. If the numbness improves we could consider stopping the  gabapentin (or just continuing the nighttime dose since insomnia is better).  She will return to see me in 6 weeks or sooner if there are new or worsening neurologic symptoms.   Ludwika Rodd A. Felecia Shelling, MD, PhD [id number], [id number] AM Certified in Neurology, Clinical Neurophysiology, Sleep Medicine, Pain Medicine and Neuroimaging  [facility] [address] [place], [place] [address] [phone]

## 2016-04-16 ENCOUNTER — Other Ambulatory Visit: Payer: Self-pay

## 2016-04-16 ENCOUNTER — Ambulatory Visit: Payer: PPO

## 2016-04-16 MED ORDER — RANITIDINE HCL 150 MG PO TABS: 150.0000 mg | ORAL_TABLET | Freq: Every day | ORAL | 1 refills | Status: DC

## 2016-04-16 NOTE — Telephone Encounter (Signed)
Fax request to refill Ranitidine at [facility]  Completed.

## 2016-04-24 ENCOUNTER — Ambulatory Visit
Admission: RE | Admit: 2016-04-24 | Discharge: 2016-04-24 | Disposition: A | Discharge status: Home / Self Care | Payer: PPO | Source: Ambulatory Visit | Attending: Neurology | Admitting: Neurology

## 2016-04-24 ENCOUNTER — Ambulatory Visit
Admission: RE | Admit: 2016-04-24 | Discharge: 2016-04-24 | Disposition: A | Discharge status: Home / Self Care | Payer: PPO | Source: Ambulatory Visit | Attending: Family Medicine | Admitting: Family Medicine

## 2016-04-24 DIAGNOSIS — R200 Anesthesia of skin: Secondary | ICD-10-CM | POA: Diagnosis not present

## 2016-04-24 DIAGNOSIS — R430 Anosmia: Secondary | ICD-10-CM

## 2016-04-24 DIAGNOSIS — M5030 Other cervical disc degeneration, unspecified cervical region: Secondary | ICD-10-CM

## 2016-04-24 MED ORDER — GADOBENATE DIMEGLUMINE 529 MG/ML IV SOLN
12.0000 mL | Freq: Once | INTRAVENOUS | Status: AC | PRN
  Administered 2016-04-24: 12 mL via INTRAVENOUS

## 2016-05-07 ENCOUNTER — Telehealth: Payer: Self-pay

## 2016-05-07 DIAGNOSIS — M4802 Spinal stenosis, cervical region: Secondary | ICD-10-CM

## 2016-05-07 DIAGNOSIS — M5030 Other cervical disc degeneration, unspecified cervical region: Secondary | ICD-10-CM

## 2016-05-07 DIAGNOSIS — M4310 Spondylolisthesis, site unspecified: Secondary | ICD-10-CM

## 2016-05-07 NOTE — Telephone Encounter (Signed)
PT IS ANXIOUS AND LOOKING FOR HER mri RESULTS, PLEASE ADVISE. STILL HAVING SAME "PROBLEMS"

## 2016-05-08 NOTE — Telephone Encounter (Signed)
Call --- MRI brain shows focus of activity in the pons of the brain which is consistent with microvascular ischemic changes which occurs with longstanding high blood pressure and high cholesterol. This is not causing her symptoms.  MRI neck shows diffuse degenerative changes with foraminal stenosis and listhesis.  I recommend follow-up with neurology. I will also place referral to neurosurgery to discuss degenerative changes in neck.  How is she tolerating Gabapentin?  How much is she currently taking?

## 2016-05-09 NOTE — Telephone Encounter (Signed)
Has been off of it entirely x 2 weeks and put on new med from neurologist "i don't recall the name" stopped taking that now too.

## 2016-05-12 ENCOUNTER — Ambulatory Visit (INDEPENDENT_AMBULATORY_CARE_PROVIDER_SITE_OTHER): Payer: PPO | Admitting: Family Medicine

## 2016-05-12 ENCOUNTER — Encounter: Payer: Self-pay | Admitting: Family Medicine

## 2016-05-12 VITALS — BP 126/77 | HR 61 | Temp 99.0°F | Resp 16 | Ht 60.5 in | Wt 133.2 lb

## 2016-05-12 DIAGNOSIS — M5030 Other cervical disc degeneration, unspecified cervical region: Secondary | ICD-10-CM

## 2016-05-12 DIAGNOSIS — Z1211 Encounter for screening for malignant neoplasm of colon: Secondary | ICD-10-CM | POA: Diagnosis not present

## 2016-05-12 DIAGNOSIS — F419 Anxiety disorder, unspecified: Secondary | ICD-10-CM

## 2016-05-12 DIAGNOSIS — I10 Essential (primary) hypertension: Secondary | ICD-10-CM

## 2016-05-12 DIAGNOSIS — E7800 Pure hypercholesterolemia, unspecified: Secondary | ICD-10-CM

## 2016-05-12 DIAGNOSIS — B001 Herpesviral vesicular dermatitis: Secondary | ICD-10-CM

## 2016-05-12 DIAGNOSIS — F99 Mental disorder, not otherwise specified: Secondary | ICD-10-CM | POA: Diagnosis not present

## 2016-05-12 DIAGNOSIS — K219 Gastro-esophageal reflux disease without esophagitis: Secondary | ICD-10-CM | POA: Diagnosis not present

## 2016-05-12 DIAGNOSIS — F5105 Insomnia due to other mental disorder: Secondary | ICD-10-CM | POA: Diagnosis not present

## 2016-05-12 DIAGNOSIS — R430 Anosmia: Secondary | ICD-10-CM | POA: Diagnosis not present

## 2016-05-12 DIAGNOSIS — F418 Other specified anxiety disorders: Secondary | ICD-10-CM

## 2016-05-12 DIAGNOSIS — R200 Anesthesia of skin: Secondary | ICD-10-CM | POA: Diagnosis not present

## 2016-05-12 DIAGNOSIS — F329 Major depressive disorder, single episode, unspecified: Secondary | ICD-10-CM

## 2016-05-12 DIAGNOSIS — Z0000 Encounter for general adult medical examination without abnormal findings: Secondary | ICD-10-CM | POA: Diagnosis not present

## 2016-05-12 DIAGNOSIS — Z23 Encounter for immunization: Secondary | ICD-10-CM

## 2016-05-12 MED ORDER — ZOSTER VACCINE LIVE 19400 UNT/0.65ML ~~LOC~~ SUSR: 0.6500 mL | Freq: Once | SUBCUTANEOUS | 0 refills | Status: AC

## 2016-05-12 NOTE — Patient Instructions (Signed)
IF you received an x-ray today, you will receive an invoice from [facility]. Please contact [facility] at [phone] with questions or concerns regarding your invoice.   IF you received labwork today, you will receive an invoice from [facility]. Please contact [facility] at [phone] with questions or concerns regarding your invoice.   Our billing staff will not be able to assist you with questions regarding bills from these companies.  You will be contacted with the lab results as soon as they are available. The fastest way to get your results is to activate your My Chart account. Instructions are located on the last page of this paperwork. If you have not heard from [place] regarding the results in 2 weeks, please contact this office.      Advance Directive Advance directives are the legal documents that allow you to make choices about your health care and medical treatment if you cannot speak for yourself. Advance directives are a way for you to communicate your wishes to family, friends, and health care providers. The specified people can then convey your decisions about end-of-life care to avoid confusion if you should become unable to communicate. Ideally, the process of discussing and writing advance directives should happen over time rather than making decisions all at once. Advance directives can be modified as your situation changes, and you can change your mind at any time, even after you have signed the advance directives. Each state has its own laws regarding advance directives. You may want to check with your health care provider, attorney, or state representative about the law in your state. Below are some examples of advance directives. HEALTH CARE PROXY AND DURABLE POWER OF ATTORNEY FOR HEALTH CARE A health care proxy is a person (agent) appointed to make medical decisions for you if you cannot. Generally, people choose someone they know well and trust to  represent their preferences when they can no longer do so. You should be sure to ask this person for agreement to act as your agent. An agent may have to exercise judgment in the event of a medical decision for which your wishes are not known. A durable power of attorney for health care is a legal document that names your health care proxy. Depending on the laws in your state, after the document is written, it may also need to be:  Signed.  Notarized.  Dated.  Copied.  Witnessed.  Incorporated into your medical record. You may also want to appoint someone to manage your financial affairs if you cannot. This is called a durable power of attorney for finances. It is a separate legal document from the durable power of attorney for health care. You may choose the same person or someone different from your health care proxy to act as your agent in financial matters. LIVING WILL A living will is a set of instructions documenting your wishes about medical care when you cannot care for yourself. It is used if you become:  Terminally ill.  Incapacitated.  Unable to communicate.  Unable to make decisions. Items to consider in your living will include:  The use or non-use of life-sustaining equipment, such as dialysis machines and breathing machines (ventilators).  A do not resuscitate (DNR) order, which is the instruction not to use cardiopulmonary resuscitation (CPR) if breathing or heartbeat stops.  Tube feeding.  Withholding of food and fluids.  Comfort (palliative) care when the goal becomes comfort rather than a cure.  Organ and tissue donation. A  living will does not give instructions about distribution of your money and property if you should pass away. It is advisable to seek the expert advice of a lawyer in drawing up a will regarding your possessions. Decisions about taxes, beneficiaries, and asset distribution will be legally binding. This process can relieve your family and  friends of any burdens surrounding disputes or questions that may come up about the allocation of your assets. DO NOT RESUSCITATE (DNR) A do not resuscitate (DNR) order is a request to not have CPR in the event that your heart stops beating or you stop breathing. Unless given other instructions, a health care provider will try to help any patient whose heart has stopped or who has stopped breathing.  This information is not intended to replace advice given to you by your health care provider. Make sure you discuss any questions you have with your health care provider. Document Released: 08/04/2007 Document Revised: 08/19/2015 Document Reviewed: 09/14/2012 Elsevier Interactive Patient Education  2017 [employer]  Get These Tests  Blood Pressure- Have your blood pressure checked by your healthcare provider at least once a year.  Normal blood pressure is 120/80.  Weight- Have your body mass index (BMI) calculated to screen for obesity.  BMI is a measure of body fat based on height and weight.  You can calculate your own BMI at [employer]  Cholesterol- Have your cholesterol checked every year.  Diabetes- Have your blood sugar checked every year if you have high blood pressure, high cholesterol, a family history of diabetes or if you are overweight.  Pap Test - Have a pap test every 1 to 5 years if you have been sexually active.  If you are older than 65 and recent pap tests have been normal you may not need additional pap tests.  In addition, if you have had a hysterectomy  for benign disease additional pap tests are not necessary.  Mammogram-Yearly mammograms are essential for early detection of breast cancer  Screening for Colon Cancer- Colonoscopy starting at age 59. Screening may begin sooner depending on your family history and other health conditions.  Follow up colonoscopy as directed by your Gastroenterologist.  Screening for Osteoporosis- Screening  begins at age 72 with bone density scanning, sooner if you are at higher risk for developing Osteoporosis.  Get these medicines  Calcium with Vitamin D- Your body requires 1200-1500 mg of Calcium a day and [phone] IU of Vitamin D a day.  You can only absorb 500 mg of Calcium at a time therefore Calcium must be taken in 2 or 3 separate doses throughout the day.  Hormones- Hormone therapy has been associated with increased risk for certain cancers and heart disease.  Talk to your healthcare provider about if you need relief from menopausal symptoms.  Aspirin- Ask your healthcare provider about taking Aspirin to prevent Heart Disease and Stroke.  Get these Immuniztions  Flu shot- Every fall  Pneumonia shot- Once after the age of 66; if you are younger ask your healthcare provider if you need a pneumonia shot.  Tetanus- Every ten years.  Zostavax- Once after the age of 43 to prevent shingles.  Take these steps  Don't smoke- Your healthcare provider can help you quit. For tips on how to quit, ask your healthcare provider or go to www.smokefree.gov or call 1-800 QUIT-NOW.  Be physically active- Exercise 5 days a week for a minimum of 30 minutes.  If you are not already physically active, start  slow and gradually work up to 30 minutes of moderate physical activity.  Try walking, dancing, bike riding, swimming, etc.  Eat a healthy diet- Eat a variety of healthy foods such as fruits, vegetables, whole grains, low fat milk, low fat cheeses, yogurt, lean meats, chicken, fish, eggs, dried beans, tofu, etc.  For more information go to www.thenutritionsource.org  Dental visit- Brush and floss teeth twice daily; visit your dentist twice a year.  Eye exam- Visit your Optometrist or Ophthalmologist yearly.  Drink alcohol in moderation- Limit alcohol intake to one drink or less a day.  Never drink and drive.  Depression- Your emotional health is as important as your physical health.  If you're  feeling down or losing interest in things you normally enjoy, please talk to your healthcare provider.  Seat Belts- can save your life; always wear one  Smoke/Carbon Monoxide detectors- These detectors need to be installed on the appropriate level of your home.  Replace batteries at least once a year.  Violence- If anyone is threatening or hurting you, please tell your healthcare provider.  Living Will/ Health care power of attorney- Discuss with your healthcare provider and family.

## 2016-05-12 NOTE — Progress Notes (Signed)
Subjective:    Patient ID: Maria Sweeney, female    DOB: 27-Oct-1950, 65 y.o.   MRN: [id number]  05/12/2016  Annual Exam   HPI This 66 y.o. female presents for Complete Physical Examination.  Last physical: n/a Pap smear:  Hysterectomy; last pap smear 10 years ago.  Ovaries intact.  Hysterectomy for DUB.  Age 64. No cervical dysplasia. Mammogram: 08-2015 Colonoscopy: 2010; repeat recommended in 5 years. Bone density: 08-2015 Eye exam: Dental exam:  Anxiety and depression: Dr. Andree Elk prescribed Buspar at visit two weeks ago; recommended 1/2 bid; could increase to 1 twice daily. When took one whole tablet developed shaking, irritablity.  Stopped taking it and no further symptoms of shaking and irritability.  Still taking Neurontin.  Stopped taking Gabapentin as well. Has also stopped taking muscle relaxer because neck pain has improved.  Really thinks that finally resolved from benzo withdrawal. No longer having muscle jerking.  Does not feel scared.  Anxiety and nervousness have improved. Depression is better.    Paresthesias/lack of smell and taste: s/p neurology consultation.  ordred MRI and cervical spine MRI.  Follow-up in 2 weeks.  Added Buspar.  Still having L sided numbness.  Eating so much because trying to satisfy hunger.    Neck pain: still present yet really reluctant to take anything now that had horrible benzo withdrawal.  Immunization History  Administered Date(s) Administered  . Influenza,inj,Quad PF,36+ Mos 02/22/2015, 02/11/2016  . Pneumococcal Conjugate-13 04/26/2015  . Pneumococcal Polysaccharide-23 05/12/2016    Review of Systems  Constitutional: Negative for activity change, appetite change, chills, diaphoresis, fatigue, fever and unexpected weight change.  HENT: Negative for congestion, dental problem, drooling, ear discharge, ear pain, facial swelling, hearing loss, mouth sores, nosebleeds, postnasal drip, rhinorrhea, sinus pressure, sneezing, sore throat,  tinnitus, trouble swallowing and voice change.   Eyes: Negative for photophobia, pain, discharge, redness, itching and visual disturbance.  Respiratory: Negative for apnea, cough, choking, chest tightness, shortness of breath, wheezing and stridor.   Cardiovascular: Negative for chest pain, palpitations and leg swelling.  Gastrointestinal: Negative for abdominal distention, abdominal pain, anal bleeding, blood in stool, constipation, diarrhea, nausea, rectal pain and vomiting.  Endocrine: Negative for cold intolerance, heat intolerance, polydipsia, polyphagia and polyuria.  Genitourinary: Negative for decreased urine volume, difficulty urinating, dyspareunia, dysuria, enuresis, flank pain, frequency, genital sores, hematuria, menstrual problem, pelvic pain, urgency, vaginal bleeding, vaginal discharge and vaginal pain.  Musculoskeletal: Positive for neck pain. Negative for arthralgias, back pain, gait problem, joint swelling, myalgias and neck stiffness.  Skin: Negative for color change, pallor, rash and wound.  Allergic/Immunologic: Negative for environmental allergies, food allergies and immunocompromised state.  Neurological: Positive for numbness. Negative for dizziness, tremors, seizures, syncope, facial asymmetry, speech difficulty, weakness, light-headedness and headaches.  Hematological: Negative for adenopathy. Does not bruise/bleed easily.  Psychiatric/Behavioral: Positive for dysphoric mood. Negative for agitation, behavioral problems, confusion, decreased concentration, hallucinations, self-injury, sleep disturbance and suicidal ideas. The patient is nervous/anxious. The patient is not hyperactive.     Past Medical History:  Diagnosis Date  . Anxiety   . Cervical spondylosis   . Depression   . GERD (gastroesophageal reflux disease)   . Hyperlipidemia   . Hypertension   . IBS (irritable bowel syndrome)    Constipation with diarrhea.  . Oral herpes    Past Surgical History:    Procedure Laterality Date  . ABDOMINAL HYSTERECTOMY  1978   DUB; ovaries intact.  . APPENDECTOMY  1988  . BREAST BIOPSY Bilateral 2009  Calcium Deposit- 1980's and also in 2009/ Right side 1995  . COLONOSCOPY  05/11/2009   Normal.  Previous colonoscopy with polyps. [facility].    . DEBRIDEMENT TENNIS ELBOW    . ROTATOR CUFF REPAIR     Allergies  Allergen Reactions  . Amoxicillin   . Augmentin [Amoxicillin-Pot Clavulanate]   . Codeine   . Depakote [Divalproex Sodium]   . Latex   . Lipitor [Atorvastatin]   . Penicillins   . Septra [Sulfamethoxazole-Trimethoprim]   . Zocor [Simvastatin]     Social History   Social History  . Marital status: Divorced    Spouse name: N/A  . Number of children: 2  . Years of education: Bachelors   Occupational History  . 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   Social History Main Topics  . Smoking status: Never Smoker  . Smokeless tobacco: Never Used  . Alcohol use 0.6 oz/week    1 Glasses of wine per week     Comment: rare alcohol use; once per year on average.  . Drug use: No  . Sexual activity: Yes    Partners: Male    Birth control/ protection: Surgical   Other Topics Concern  . Not on file   Social History Narrative   Marital status: divorced since 1999; dating seriously in 2018 since 2015.      Children: one son, one daughter; 2 grandchildren.      Lives: alone in house with dogs     Employment:  Retired.      Tobacco: none      Alcohol: rare      Drugs: none      Exercise:  Sporadic   Right-handed.   Occasional caffeine use.      ADLs: independent with ADLs; drives; no assistant devices.      Advanced Directives: none; FULL CODE.   Family History  Problem Relation Age of Onset  . Hyperlipidemia Mother   . Hypertension Mother   . Cancer Mother     lung  . Diabetes Mother   . Glaucoma Mother   . Stroke Mother 32  . Heart disease Mother   . Asthma Father   . Emphysema Father   . COPD Father   .  Glaucoma Father   . Hypertension Sister        Objective:    BP 126/77 (BP Location: Right Arm, Patient Position: Sitting, Cuff Size: Small)   Pulse 61   Temp 99 F (37.2 C) (Oral)   Resp 16   Ht 5' 0.5" (1.537 m)   Wt 133 lb 3.2 oz (60.4 kg)   SpO2 97%   BMI 25.59 kg/m  Physical Exam  Constitutional: She is oriented to person, place, and time. She appears well-developed and well-nourished. No distress.  HENT:  Head: Normocephalic and atraumatic.  Right Ear: External ear normal.  Left Ear: External ear normal.  Nose: Nose normal.  Mouth/Throat: Oropharynx is clear and moist.  Eyes: Conjunctivae and EOM are normal. Pupils are equal, round, and reactive to light.  Neck: Normal range of motion and full passive range of motion without pain. Neck supple. No JVD present. Carotid bruit is not present. No thyromegaly present.  Cardiovascular: Normal rate, regular rhythm and normal heart sounds.  Exam reveals no gallop and no friction rub.   No murmur heard. Pulmonary/Chest: Effort normal and breath sounds normal. She has no wheezes. She has no rales. Right breast exhibits no mass, no  nipple discharge, no skin change and no tenderness. Left breast exhibits no mass, no nipple discharge and no skin change. Breasts are symmetrical.  Abdominal: Soft. Bowel sounds are normal. She exhibits no distension and no mass. There is no tenderness. There is no rebound and no guarding.  Musculoskeletal:       Right shoulder: Normal.       Left shoulder: Normal.       Cervical back: Normal.  Lymphadenopathy:    She has no cervical adenopathy.  Neurological: She is alert and oriented to person, place, and time. She has normal reflexes. No cranial nerve deficit. She exhibits normal muscle tone. Coordination normal.  Skin: Skin is warm and dry. No rash noted. She is not diaphoretic. No erythema. No pallor.  Psychiatric: She has a normal mood and affect. Her behavior is normal. Judgment and thought content  normal.  Nursing note and vitals reviewed.  Results for orders placed or performed in visit on 03/27/16  CBC with Differential/Platelet  Result Value Ref Range   WBC 9.1 3.8 - 10.8 K/uL   RBC 4.43 3.80 - 5.10 MIL/uL   Hemoglobin 14.2 11.7 - 15.5 g/dL   HCT 40.8 35.0 - 45.0 %   MCV 92.1 80.0 - 100.0 fL   MCH 32.1 27.0 - 33.0 pg   MCHC 34.8 32.0 - 36.0 g/dL   RDW 13.5 11.0 - 15.0 %   Platelets 332 140 - 400 K/uL   MPV 9.8 7.5 - 12.5 fL   Neutro Abs 6,825 1,500 - 7,800 cells/uL   Lymphs Abs 1,547 850 - 3,900 cells/uL   Monocytes Absolute 637 200 - 950 cells/uL   Eosinophils Absolute 91 15 - 500 cells/uL   Basophils Absolute 0 0 - 200 cells/uL   Neutrophils Relative % 75 %   Lymphocytes Relative 17 %   Monocytes Relative 7 %   Eosinophils Relative 1 %   Basophils Relative 0 %   Smear Review Criteria for review not met   Comprehensive metabolic panel  Result Value Ref Range   Sodium 142 135 - 146 mmol/L   Potassium 4.1 3.5 - 5.3 mmol/L   Chloride 105 98 - 110 mmol/L   CO2 27 20 - 31 mmol/L   Glucose, Bld 103 (H) 65 - 99 mg/dL   BUN 8 7 - 25 mg/dL   Creat 0.89 0.50 - 0.99 mg/dL   Total Bilirubin 0.8 0.2 - 1.2 mg/dL   Alkaline Phosphatase 72 33 - 130 U/L   AST 14 10 - 35 U/L   ALT 16 6 - 29 U/L   Total Protein 7.1 6.1 - 8.1 g/dL   Albumin 4.7 3.6 - 5.1 g/dL   Calcium 10.3 8.6 - 10.4 mg/dL   Depression screen [facility] 2/9 05/12/2016 04/01/2016 03/21/2016 03/21/2016 02/11/2016  Decreased Interest 0 1 0 0 3  Down, Depressed, Hopeless 0 3 0 0 1  PHQ - 2 Score 0 4 0 0 4  Altered sleeping - 3 - - 2  Tired, decreased energy - 3 - - 3  Change in appetite - 0 - - 2  Feeling bad or failure about yourself  - 2 - - 2  Trouble concentrating - 3 - - 1  Moving slowly or fidgety/restless - 3 - - 1  Suicidal thoughts - 0 - - 0  PHQ-9 Score - 18 - - 15   Fall Risk  05/12/2016 04/09/2016 04/01/2016 03/21/2016 02/11/2016  Falls in the past year? No No Yes No  Yes  Number falls in past yr: - - 2  or more - 2 or more  Injury with Fall? - - No - No   Functional Status Survey: Is the patient deaf or have difficulty hearing?: No Does the patient have difficulty seeing, even when wearing glasses/contacts?: Yes Does the patient have difficulty concentrating, remembering, or making decisions?: Yes Does the patient have difficulty walking or climbing stairs?: No Does the patient have difficulty dressing or bathing?: No Does the patient have difficulty doing errands alone such as visiting a doctor's office or shopping?: No     Assessment & Plan:   1. Encounter for Medicare annual wellness exam   2. Essential hypertension, benign   3. Gastroesophageal reflux disease without esophagitis   4. Herpes labialis   5. Degenerative disc disease, cervical   6. Anxiety and depression   7. Insomnia due to other mental disorder   8. Loss of smell   9. Numbness   10. Pure hypercholesterolemia   11. Colon cancer screening   12. Need for prophylactic vaccination against Streptococcus pneumoniae (pneumococcus)   13. Need for Zostavax administration    -anticipatory guidance provided -- living will and advanced directive discussion, weight loss, exercise, and low-calorie food choices.  Recommend 3 servings of calcium daily. -independent with ADLs; low fall risk; undergoing treatment for depression. -anxiety and depression have improved.  No changes to management. -s/p Pneumovax and rx for Zostavax provided. -refer to GI for repeat colonoscopy. -follow-up with neurology to evaluate ongoing paresthesias and lack of smell.   Orders Placed This Encounter  Procedures  . Pneumococcal polysaccharide vaccine 23-valent greater than or equal to 2yo subcutaneous/IM  . Ambulatory referral to Gastroenterology    Referral Priority:   Routine    Referral Type:   Consultation    Referral Reason:   Specialty Services Required    Number of Visits Requested:   1   Meds ordered this encounter  Medications  .  Zoster Vaccine Live, PF, (ZOSTAVAX) 59747 UNT/0.65ML injection    Sig: Inject 19,400 Units into the skin once.    Dispense:  0.65 mL    Refill:  0    Return in about 3 months (around 08/10/2016) for recheck anxirety, hghi blood pressure.   Kristi Elayne Guerin, M.D. [facility] [address] [place], [place]  [phone] phone [phone] fax

## 2016-05-14 ENCOUNTER — Telehealth: Payer: Self-pay | Admitting: Gastroenterology

## 2016-05-19 ENCOUNTER — Encounter: Payer: Self-pay | Admitting: Gastroenterology

## 2016-05-19 NOTE — Telephone Encounter (Signed)
Dr. Havery Moros reviewed records and has accepted patient. OV scheduled.

## 2016-05-26 ENCOUNTER — Telehealth: Payer: Self-pay | Admitting: *Deleted

## 2016-05-26 NOTE — Telephone Encounter (Signed)
[facility] and r/s her appt. with RAS on 05-27-16 at 0830.  Our office will be opening late due to anticipated inclement weather/poor road conditions/fim

## 2016-05-27 ENCOUNTER — Ambulatory Visit: Payer: PPO | Admitting: Neurology

## 2016-06-02 DIAGNOSIS — R430 Anosmia: Secondary | ICD-10-CM | POA: Diagnosis not present

## 2016-06-03 ENCOUNTER — Telehealth: Payer: Self-pay | Admitting: *Deleted

## 2016-06-03 ENCOUNTER — Telehealth: Payer: Self-pay | Admitting: Neurology

## 2016-06-03 NOTE — Telephone Encounter (Signed)
Maria Sweeney with Dr. Tami Ribas office called in reference to patient being seen with them 06/02/16.  Patient stated to their office she had not received the results from MRI.  Maria Sweeney is asking if we can contact patient and send over a copy of the report to their office at [phone].

## 2016-06-03 NOTE — Telephone Encounter (Signed)
I have spoken with Maria Sweeney this afternoon and reviewed MRI brain results with her--degen. changes in neck, some spots that are likely age related in the brain, a little more than normal for her age, but no acute process visualized.  RAS will discuss in greater detail at f/u appt.  She verbalized understanding of same--requested copy of MRI report be faxed to Dr. Tami Ribas (ENT) whom she is seeing for loss of taste, smell as well.  I have faxed copy of MRI brain report to Dr. Ileene Hutchinson office at below fax#/fim

## 2016-06-04 DIAGNOSIS — M4722 Other spondylosis with radiculopathy, cervical region: Secondary | ICD-10-CM | POA: Diagnosis not present

## 2016-06-08 ENCOUNTER — Ambulatory Visit: Payer: PPO | Attending: Neurosurgery | Admitting: Physical Therapy

## 2016-06-08 ENCOUNTER — Encounter: Payer: Self-pay | Admitting: Physical Therapy

## 2016-06-08 DIAGNOSIS — M6281 Muscle weakness (generalized): Secondary | ICD-10-CM | POA: Diagnosis not present

## 2016-06-08 DIAGNOSIS — M5412 Radiculopathy, cervical region: Secondary | ICD-10-CM | POA: Diagnosis not present

## 2016-06-08 DIAGNOSIS — M436 Torticollis: Secondary | ICD-10-CM | POA: Diagnosis not present

## 2016-06-09 NOTE — Therapy (Signed)
[facility] [address], [place], [address] Phone: [phone]   Fax:  [phone]  Physical Therapy Evaluation  Patient Details  Name: Maria Sweeney MRN: [id number] Date of Birth: Jan 22, 1951 Referring Provider: Marylyn Ishihara L. Cabbell  Encounter Date: 06/08/2016      PT End of Session - 06/08/16 1754    Visit Number 1   Number of Visits 13   Date for PT Re-Evaluation 07/20/16   PT Start Time 0433   PT Stop Time 0500   PT Time Calculation (min) 27 min      Past Medical History:  Diagnosis Date  . Anxiety   . Cervical spondylosis   . Depression   . GERD (gastroesophageal reflux disease)   . Hyperlipidemia   . Hypertension   . IBS (irritable bowel syndrome)    Constipation with diarrhea.  . Oral herpes     Past Surgical History:  Procedure Laterality Date  . ABDOMINAL HYSTERECTOMY  1978   DUB; ovaries intact.  . APPENDECTOMY  1988  . BREAST BIOPSY Bilateral 2009   Calcium Deposit- 1980's and also in 2009/ Right side 1995  . COLONOSCOPY  05/11/2009   Normal.  Previous colonoscopy with polyps. [facility].    . DEBRIDEMENT TENNIS ELBOW    . ROTATOR CUFF REPAIR      There were no vitals filed for this visit.       Subjective Assessment - 06/08/16 1733    Subjective Pt presents to clinic 15 minutes late for appointment with decreased cervical AROM and neck pain for the past 20 years. Pt complains of numbness and tingling down arm into 5th finger. Pt states that since November her entire L side has had "less sensation" than her R she attributes this to being weaned off her medication. Pt states that her neck get "extremely stiff" especially after activity which correlates with an increase in radicular symptoms when she feels stiff. Pt states that she has been diagnosed with DDD and that she will require surgery however states that she would like to avoid surgery. Pt states that she has difficulty with eating  and gets fatigued extremely swallowing, this began 03/2016, around the time she was weaned off her medication. Pt reports wide variety of symptoms, some of which appear to be musculoskeletal and some may not be. Will continue to follow.   Pertinent History Chronic neck pain   Limitations House hold activities   Currently in Pain? No/denies                               PT Education - 06/09/16 0752    Education provided Yes   Education Details Course of therapy   Person(s) Educated Patient   Methods Explanation   Comprehension Verbalized understanding             PT Long Term Goals - 06/08/16 1804      PT LONG TERM GOAL #1   Title Pt. will be able to take dogs for short walk without pain afterwards to improve general conditioning.   Baseline Unable to walk dogs   Time 6   Period Weeks   Status New     PT LONG TERM GOAL #2   Title Pt NDI score will decrease by 6 points indicating decreased neck disability and improved overall function   Baseline Will fill out NDI at next session.   Time  6   Period Weeks   Status New     PT LONG TERM GOAL #3   Title Pt will be independent in HEP to be able to safely perform exercises at home to facilitate healing   Baseline Not independent   Time 6   Period Weeks   Status New               Plan - 06/08/16 1755    Clinical Impression Statement Pt is a pleasant 66 year old female who presents to the clinic with neck pain for ~20 years. No MOI 20 yrs ago, changes in symptoms over the past few months due to weaning off of medication (per self report). Currently, cervical ROM is significantly decreased most notably in B lateral flexion, L rotation and extension as well as R thoracic rotation. Noted hypertonicity of upper traps and levator scapula with myofascial trigger points. Upper quarter neuro screen performed, myotome and DTR were WNL bilaterally, sensation was decreased throughout entire L UE and neck  compared to R. Pt states that she has a difficult time chewing and swallowing and states that she gets extremely fatigued with eating.   Rehab Potential Fair   Clinical Impairments Affecting Rehab Potential Positive: Motivated, age           Negatives: Chronic pain, social support   PT Frequency 2x / week   PT Duration 6 weeks   PT Treatment/Interventions Manual techniques;Energy conservation;Passive range of motion;Therapeutic exercise;Therapeutic activities;Aquatic Therapy;Dry needling;ADLs/Self Care Home Management   PT Next Visit Plan Further assess mobility of spinal segments and musculature   Consulted and Agree with Plan of Care Patient      Patient will benefit from skilled therapeutic intervention in order to improve the following deficits and impairments:  Pain, Decreased activity tolerance, Decreased endurance, Decreased range of motion, Decreased mobility, Decreased strength, Impaired sensation, Impaired tone  Visit Diagnosis: Neck stiffness - Plan: PT plan of care cert/re-cert  Radiculopathy, cervical region - Plan: PT plan of care cert/re-cert  Muscle weakness (generalized) - Plan: PT plan of care cert/re-cert      G-Codes - [id number]    Functional Assessment Tool Used NDI, AROM, clinical judgement   Functional Limitation Changing and maintaining body position   Changing and Maintaining Body Position Current Status [id number]) At least 20 percent but less than 40 percent impaired, limited or restricted   Changing and Maintaining Body Position Goal Status [id number]) At least 1 percent but less than 20 percent impaired, limited or restricted       Problem List Patient Active Problem List   Diagnosis Date Noted  . Numbness 04/13/2016  . Loss of smell 04/13/2016  . Pure hypercholesterolemia 11/30/2014  . Degenerative disc disease, cervical 04/24/2014  . Essential hypertension, benign 10/19/2013  . Insomnia 10/19/2013  . Herpes labialis 10/19/2013  . Anxiety and  depression 07/25/2012  . GERD (gastroesophageal reflux disease) 07/25/2012    Johathon Overturf PT DPT 06/09/2016, 8:14 AM  [facility] [address] [place], [address] Phone: [phone]   Fax:  [phone]  Name: Maria Sweeney MRN: [id number] Date of Birth: 01-Oct-1950

## 2016-06-15 ENCOUNTER — Ambulatory Visit: Payer: PPO | Admitting: Neurology

## 2016-06-18 ENCOUNTER — Ambulatory Visit: Payer: PPO | Attending: Neurosurgery | Admitting: Physical Therapy

## 2016-06-18 ENCOUNTER — Encounter: Payer: Self-pay | Admitting: Physical Therapy

## 2016-06-18 DIAGNOSIS — M6281 Muscle weakness (generalized): Secondary | ICD-10-CM | POA: Insufficient documentation

## 2016-06-18 DIAGNOSIS — M436 Torticollis: Secondary | ICD-10-CM | POA: Diagnosis not present

## 2016-06-18 DIAGNOSIS — M5412 Radiculopathy, cervical region: Secondary | ICD-10-CM | POA: Insufficient documentation

## 2016-06-18 NOTE — Therapy (Addendum)
[facility] [address] [place], [address] Phone: [phone]   Fax:  [phone]  Physical Therapy Treatment  Patient Details  Name: Maria Sweeney MRN: [id number] Date of Birth: [birth year] Referring Provider: Marylyn Ishihara L. Cabbell  Encounter Date: 06/18/2016      PT End of Session - 06/18/16 1543    Visit Number 2   Number of Visits 13   Date for PT Re-Evaluation 07/20/16   PT Start Time [id number]   PT Stop Time 1520   PT Time Calculation (min) 42 min      Past Medical History:  Diagnosis Date  . Anxiety   . Cervical spondylosis   . Depression   . GERD (gastroesophageal reflux disease)   . Hyperlipidemia   . Hypertension   . IBS (irritable bowel syndrome)    Constipation with diarrhea.  . Oral herpes     Past Surgical History:  Procedure Laterality Date  . ABDOMINAL HYSTERECTOMY  1978   DUB; ovaries intact.  . APPENDECTOMY  1988  . BREAST BIOPSY Bilateral 2009   Calcium Deposit- 1980's and also in 2009/ Right side 1995  . COLONOSCOPY  05/11/2009   Normal.  Previous colonoscopy with polyps. [facility].    . DEBRIDEMENT TENNIS ELBOW    . ROTATOR CUFF REPAIR      There were no vitals filed for this visit.      Subjective Assessment - 06/18/16 1546    Subjective Pt states that her neck is status quo from last week. She states that she is still experiencing numbness and tingling down both arms L>R and localized stiffness of her neck with rotation and extension. Pt states that she has begun regaining sensation around her lips which has been diminished since October when she began withdrawls. Pt stated that this increased sensation is accompanied by an increase in taste. She also states that she has experienced less hot flashes this week.   Pertinent History Chronic neck pain   Limitations House hold activities   Currently in Pain? No/denies   Multiple Pain Sites No       Objective: Prone CPA C3-T4  grade 1-3 3x30seconds each level 1 bout per grade. Pt stated she felt like treatment was originally uncomfortable however eased as treatment was progressed. She stated that it felt like it was "loosening up."  STM compression and cross friction to B rhomboids where pt stated that she felt pain and tightness earlier this week. Pt stated that STM was tolerable and eased throughout treatment.  Supine suboccipital release focusing on R side. Pt state that she was tight on R side and had a R sided "tension headache" this week that followed a typical rams horn pattern. Noted trigger points that eased with treatment.  Supine B upper traps and levator scapula stretch. 3x30 seconds each.  Performed 15x5 second holds scapular retraction and cervical retraction.                             PT Education - 06/18/16 1553    Education provided Yes   Education Details Proper sitting/resting posture   Person(s) Educated Patient   Methods Explanation;Demonstration;Tactile cues;Verbal cues   Comprehension Verbalized understanding;Returned demonstration             PT Long Term Goals - 06/08/16 1804      PT LONG TERM GOAL #1   Title Pt. will be able  to take dogs for short walk without pain afterwards to improve general conditioning.   Baseline Unable to walk dogs   Time 6   Period Weeks   Status New     PT LONG TERM GOAL #2   Title Pt NDI score will decrease by 6 points indicating decreased neck disability and improved overall function   Baseline Will fill out NDI at next session.   Time 6   Period Weeks   Status New     PT LONG TERM GOAL #3   Title Pt will be independent in HEP to be able to safely perform exercises at home to facilitate healing   Baseline Not independent   Time 6   Period Weeks   Status New               Plan - 06/18/16 1554    Clinical Impression Statement Pt cervical rotation, and extenstion ROM continues to be decreased. CPA of  cervical and thoracic vertebra reveals hypomobility C3-T4 and concordent pain/discomfort pt feels upon active extension. B unilateral PA reveals hypomobility with concordent pain to when pt actively rotates. After treatment ~20 degree improvement either side rotation, ~10 degree increase in extension.    Rehab Potential Fair   Clinical Impairments Affecting Rehab Potential Positive: Motivated, age           Negatives: Chronic pain, social support   PT Frequency 2x / week   PT Duration 6 weeks   PT Treatment/Interventions Manual techniques;Energy conservation;Passive range of motion;Therapeutic exercise;Therapeutic activities;Aquatic Therapy;Dry needling;ADLs/Self Care Home Management   PT Next Visit Plan Further assess mobility of spinal segments and musculature   Consulted and Agree with Plan of Care Patient      Patient will benefit from skilled therapeutic intervention in order to improve the following deficits and impairments:  Pain, Decreased activity tolerance, Decreased endurance, Decreased range of motion, Decreased mobility, Decreased strength, Impaired sensation, Impaired tone  Visit Diagnosis: Neck stiffness  Radiculopathy, cervical region  Muscle weakness (generalized)     Problem List Patient Active Problem List   Diagnosis Date Noted  . Numbness 04/13/2016  . Loss of smell 04/13/2016  . Pure hypercholesterolemia 11/30/2014  . Degenerative disc disease, cervical 04/24/2014  . Essential hypertension, benign 10/19/2013  . Insomnia 10/19/2013  . Herpes labialis 10/19/2013  . Anxiety and depression 07/25/2012  . GERD (gastroesophageal reflux disease) 07/25/2012    Mairyn Lenahan PT DPT 06/18/2016, 4:08 PM  [facility] [address] [place], [address] Phone: [phone]   Fax:  [phone]  Name: Maria Sweeney MRN: [id number] Date of Birth: [birth year]

## 2016-06-22 NOTE — Therapy (Signed)
[facility] [address], [place], [address] Phone: [phone]   Fax:  [phone]  Physical Therapy Treatment  Patient Details  Name: Maria Sweeney MRN: [id number] Date of Birth: [birth year] Referring Provider: Marylyn Ishihara L. Cabbell  Encounter Date: 06/18/2016    Past Medical History:  Diagnosis Date  . Anxiety   . Cervical spondylosis   . Depression   . GERD (gastroesophageal reflux disease)   . Hyperlipidemia   . Hypertension   . IBS (irritable bowel syndrome)    Constipation with diarrhea.  . Oral herpes     Past Surgical History:  Procedure Laterality Date  . ABDOMINAL HYSTERECTOMY  1978   DUB; ovaries intact.  . APPENDECTOMY  1988  . BREAST BIOPSY Bilateral 2009   Calcium Deposit- 1980's and also in 2009/ Right side 1995  . COLONOSCOPY  05/11/2009   Normal.  Previous colonoscopy with polyps. [facility].    . DEBRIDEMENT TENNIS ELBOW    . ROTATOR CUFF REPAIR      There were no vitals filed for this visit.                                    PT Long Term Goals - 06/08/16 1804      PT LONG TERM GOAL #1   Title Pt. will be able to take dogs for short walk without pain afterwards to improve general conditioning.   Baseline Unable to walk dogs   Time 6   Period Weeks   Status New     PT LONG TERM GOAL #2   Title Pt NDI score will decrease by 6 points indicating decreased neck disability and improved overall function   Baseline Will fill out NDI at next session.   Time 6   Period Weeks   Status New     PT LONG TERM GOAL #3   Title Pt will be independent in HEP to be able to safely perform exercises at home to facilitate healing   Baseline Not independent   Time 6   Period Weeks   Status New             Patient will benefit from skilled therapeutic intervention in order to improve the following deficits and impairments:  Pain, Decreased activity  tolerance, Decreased endurance, Decreased range of motion, Decreased mobility, Decreased strength, Impaired sensation, Impaired tone  Visit Diagnosis: Neck stiffness  Radiculopathy, cervical region  Muscle weakness (generalized)     Problem List Patient Active Problem List   Diagnosis Date Noted  . Numbness 04/13/2016  . Loss of smell 04/13/2016  . Pure hypercholesterolemia 11/30/2014  . Degenerative disc disease, cervical 04/24/2014  . Essential hypertension, benign 10/19/2013  . Insomnia 10/19/2013  . Herpes labialis 10/19/2013  . Anxiety and depression 07/25/2012  . GERD (gastroesophageal reflux disease) 07/25/2012    Fisher,Benjamin 06/22/2016, 7:22 AM  [facility] [address] [place], [address] Phone: [phone]   Fax:  [phone]  Name: Maria Sweeney MRN: [id number] Date of Birth: 05/06/1951

## 2016-06-23 ENCOUNTER — Ambulatory Visit: Payer: PPO

## 2016-06-23 DIAGNOSIS — M436 Torticollis: Secondary | ICD-10-CM | POA: Diagnosis not present

## 2016-06-23 DIAGNOSIS — M6281 Muscle weakness (generalized): Secondary | ICD-10-CM

## 2016-06-23 DIAGNOSIS — M5412 Radiculopathy, cervical region: Secondary | ICD-10-CM

## 2016-06-23 NOTE — Therapy (Signed)
[facility] [address], [place], [address] Phone: [phone]   Fax:  [phone]  Physical Therapy Treatment  Patient Details  Name: Maria Sweeney MRN: [id number] Date of Birth: 10/29/1950 Referring Provider: Marylyn Ishihara L. Cabbell  Encounter Date: 06/23/2016      PT End of Session - 06/23/16 0917    Visit Number 3   Number of Visits 13   Date for PT Re-Evaluation 07/20/16   PT Start Time 0909   PT Stop Time 0947   PT Time Calculation (min) 38 min   Activity Tolerance Patient tolerated treatment well   Behavior During Therapy [facility] for tasks assessed/performed      Past Medical History:  Diagnosis Date  . Anxiety   . Cervical spondylosis   . Depression   . GERD (gastroesophageal reflux disease)   . Hyperlipidemia   . Hypertension   . IBS (irritable bowel syndrome)    Constipation with diarrhea.  . Oral herpes     Past Surgical History:  Procedure Laterality Date  . ABDOMINAL HYSTERECTOMY  1978   DUB; ovaries intact.  . APPENDECTOMY  1988  . BREAST BIOPSY Bilateral 2009   Calcium Deposit- 1980's and also in 2009/ Right side 1995  . COLONOSCOPY  05/11/2009   Normal.  Previous colonoscopy with polyps. [facility].    . DEBRIDEMENT TENNIS ELBOW    . ROTATOR CUFF REPAIR      There were no vitals filed for this visit.      Subjective Assessment - 06/23/16 0911    Subjective Pt states she still has some R neck tightness and soreness, but feels like her neck is moving better. Pt states she has been performing HEP which make her feel better. Pt states she occasionally feels a "pop or crack that something is catching" in her neck. Pt reports no HAs. Pt reports still not being able to taste when eating. Pt states she has an appointment with her neurologist on Thursday.    Pertinent History Chronic neck pain   Limitations House hold activities   Currently in Pain? No/denies   Pain Score 0-No pain         Objective:  CPA C3-T3, R side unilateral PA 3x30 seconds each segment grades 2-3. Pt stated that she felt tender at first however it eased as treatment progressed. Pt stated that she felt like her cervical ROM was more after treatment.  Seated L rotation mobilization with movement applying PA and R unilateral PA pressure to C6-C7. Pt stated that she felt her L rotation increase as treatment progress. Performed 2x10 reps of L rotation.  Pt educated on seated levator scapula stretch and performed 3x30". Pt instructed to perform at home as needed but at least 3x30" a day.  HEP reviewed with verbal cuing to correct cervical retractions. Scapular retractions progressed to use RTB. Pt performed 2x10 scap retractions in clinic with RTB with tactile cuing to squeeze lower scapulas not shrug, instructed to perform 2x10 at home.                             PT Education - 06/23/16 0916    Education provided Yes   Education Details HEP   Person(s) Educated Patient   Methods Explanation;Demonstration   Comprehension Verbalized understanding;Returned demonstration             PT Long Term Goals - 06/08/16 1804  PT LONG TERM GOAL #1   Title Pt. will be able to take dogs for short walk without pain afterwards to improve general conditioning.   Baseline Unable to walk dogs   Time 6   Period Weeks   Status New     PT LONG TERM GOAL #2   Title Pt NDI score will decrease by 6 points indicating decreased neck disability and improved overall function   Baseline Will fill out NDI at next session.   Time 6   Period Weeks   Status New     PT LONG TERM GOAL #3   Title Pt will be independent in HEP to be able to safely perform exercises at home to facilitate healing   Baseline Not independent   Time 6   Period Weeks   Status New               Plan - 06/23/16 0920    Clinical Impression Statement Pt presents with decreased L cervical rotation, and decreased  cervical extension cervical CPAs hypomobile and tender, R unilateral CPAs hypomobile and tender. Pt HEP reviewed and required verbal cuing to correct cervical retractions. Pt HEP is going well and she states that she can tell that her exercises are helping. Pt L cervical rot and extension increased after soft tissue and joint mobs. Continue soft tissue and joint mobs to increase ROM and decrease stiffness and pain.   Rehab Potential Fair   Clinical Impairments Affecting Rehab Potential Positive: Motivated, age           Negatives: Chronic pain, social support   PT Frequency 2x / week   PT Duration 6 weeks   PT Treatment/Interventions Manual techniques;Energy conservation;Passive range of motion;Therapeutic exercise;Therapeutic activities;Aquatic Therapy;Dry needling;ADLs/Self Care Home Management   PT Next Visit Plan Further assess mobility of spinal segments and musculature   Consulted and Agree with Plan of Care Patient      Patient will benefit from skilled therapeutic intervention in order to improve the following deficits and impairments:  Pain, Decreased activity tolerance, Decreased endurance, Decreased range of motion, Decreased mobility, Decreased strength, Impaired sensation, Impaired tone  Visit Diagnosis: Neck stiffness  Radiculopathy, cervical region  Muscle weakness (generalized)     Problem List Patient Active Problem List   Diagnosis Date Noted  . Numbness 04/13/2016  . Loss of smell 04/13/2016  . Pure hypercholesterolemia 11/30/2014  . Degenerative disc disease, cervical 04/24/2014  . Essential hypertension, benign 10/19/2013  . Insomnia 10/19/2013  . Herpes labialis 10/19/2013  . Anxiety and depression 07/25/2012  . GERD (gastroesophageal reflux disease) 07/25/2012   Marylu Lund, Logan SPT Phillips Grout PT, DPT   Azarion Hove 06/23/2016, 9:57 AM  [facility] [address] [place],  [address] Phone: [phone]   Fax:  [phone]  Name: Maria Sweeney MRN: [id number] Date of Birth: [birth year]

## 2016-06-25 ENCOUNTER — Ambulatory Visit (INDEPENDENT_AMBULATORY_CARE_PROVIDER_SITE_OTHER): Payer: PPO | Admitting: Neurology

## 2016-06-25 ENCOUNTER — Ambulatory Visit: Payer: PPO | Admitting: Physical Therapy

## 2016-06-25 ENCOUNTER — Encounter: Payer: Self-pay | Admitting: Neurology

## 2016-06-25 ENCOUNTER — Encounter: Payer: Self-pay | Admitting: Physical Therapy

## 2016-06-25 VITALS — BP 104/62 | HR 68 | Resp 14 | Ht 60.5 in | Wt 135.5 lb

## 2016-06-25 DIAGNOSIS — H9319 Tinnitus, unspecified ear: Secondary | ICD-10-CM | POA: Diagnosis not present

## 2016-06-25 DIAGNOSIS — M5412 Radiculopathy, cervical region: Secondary | ICD-10-CM

## 2016-06-25 DIAGNOSIS — F419 Anxiety disorder, unspecified: Secondary | ICD-10-CM

## 2016-06-25 DIAGNOSIS — R430 Anosmia: Secondary | ICD-10-CM

## 2016-06-25 DIAGNOSIS — R200 Anesthesia of skin: Secondary | ICD-10-CM

## 2016-06-25 DIAGNOSIS — F5105 Insomnia due to other mental disorder: Secondary | ICD-10-CM

## 2016-06-25 DIAGNOSIS — F99 Mental disorder, not otherwise specified: Secondary | ICD-10-CM | POA: Diagnosis not present

## 2016-06-25 DIAGNOSIS — F418 Other specified anxiety disorders: Secondary | ICD-10-CM | POA: Diagnosis not present

## 2016-06-25 DIAGNOSIS — F329 Major depressive disorder, single episode, unspecified: Secondary | ICD-10-CM

## 2016-06-25 DIAGNOSIS — M436 Torticollis: Secondary | ICD-10-CM

## 2016-06-25 DIAGNOSIS — M6281 Muscle weakness (generalized): Secondary | ICD-10-CM

## 2016-06-25 DIAGNOSIS — F32A Depression, unspecified: Secondary | ICD-10-CM

## 2016-06-25 MED ORDER — ARIPIPRAZOLE 5 MG PO TABS: 5.0000 mg | ORAL_TABLET | Freq: Every day | ORAL | 5 refills | Status: DC

## 2016-06-25 NOTE — Progress Notes (Addendum)
[facility]  PATIENT: Maria Sweeney DOB: 1950-08-28  REFERRING DOCTOR OR PCP:  Reginia Forts SOURCE: patient, records, MRI images and report  _________________________________   HISTORICAL  CHIEF COMPLAINT:  Chief Complaint  Patient presents with  . Numbness    Sts. left sided numbness is unchanges.  Sts. ringing in ears is some improved.  Sts. blurry visoin right eye is unchanged.  Sts. she was not able to tolerate Buspar so stopped it and prefers not to start another medication.  Sts. she also stopped Gabapentin--just preferred not to take it./fim  . Speech Disturbance  . Tinnitus  . Visual Disturbance    HISTORY OF PRESENT ILLNESS:  Maria Sweeney Is a 66 year old woman with left-sided numbness me anxiety and tinnitus and blurry vision.  She continues to have numbness in the left face, arm and leg. Numbness does not increase or decrease with position or other activities.  At times, she notes more numbness than other times. She feels it is better than it was at the last visit. Symptoms started after she tapered herself off of clonazepam after being on it for 22 years.     She continues to have reduced taste and smell.   She passed a smell test when she saw ENT.  Symptoms started after she stopped the clonazepam.     Tinnitus is better but she still notes it when surroundings are quiet.  She still has some anxiety. She would like to remain off of benzodiazepines if possible.  She was already on Prozac 40 mg when she tapered the clonazepam.  BuSpar was added at the last visit but had some tolerability issues with it and stopped. She has never been on Abilify.   The MRI of the brain 04/24/2016 shows T2/FLAIR hyperintense foci in the pons in the hemispheres. This is likely due to chronic microvascular ischemic change the MRI of the cervical spine shows spinal stenosis at C5-C6 and degenerative changes at C3-C4, C4-C5 and C6-C7 there is multilevel foraminal  narrowing. There is no spinal cord compression.   REVIEW OF SYSTEMS: Constitutional: No fevers, chills, sweats, or change in appetite Eyes: No visual changes, double vision, eye pain Ear, nose and throat: She noted some ringing in her ears. She has had loss of sense of smell and taste. Cardiovascular: No chest pain, palpitations Respiratory: No shortness of breath at rest or with exertion.   No wheezes GastrointestinaI: No nausea, vomiting, diarrhea, abdominal pain, fecal incontinence Genitourinary: No dysuria, urinary retention or frequency.  No nocturia. Musculoskeletal: No neck pain, back pain Integumentary: No rash, pruritus, skin lesions Neurological: as above Psychiatric: No depression at this time.  Notes anxiety Endocrine: No palpitations, diaphoresis, change in appetite, change in weigh or increased thirst Hematologic/Lymphatic: No anemia, purpura, petechiae. Allergic/Immunologic: No itchy/runny eyes, nasal congestion, recent allergic reactions, rashes  ALLERGIES: Allergies  Allergen Reactions  . Amoxicillin   . Augmentin [Amoxicillin-Pot Clavulanate]   . Codeine   . Depakote [Divalproex Sodium]   . Latex   . Lipitor [Atorvastatin]   . Penicillins   . Septra [Sulfamethoxazole-Trimethoprim]   . Zocor [Simvastatin]     HOME MEDICATIONS:  Current Outpatient Prescriptions:  .  albuterol (PROVENTIL HFA;VENTOLIN HFA) 108 (90 Base) MCG/ACT inhaler, Inhale 2 puffs into the lungs every 4 (four) hours as needed for wheezing or shortness of breath (cough, shortness of breath or wheezing.)., Disp: 1 Inhaler, Rfl: 1 .  FLUoxetine (PROZAC) 20 MG capsule, Take 2 capsules (40 mg total) by mouth  daily., Disp: 60 capsule, Rfl: 5 .  methocarbamol (ROBAXIN) 500 MG tablet, TAKE ONE OR TWO TABLETS AT BEDTIME, Disp: 60 tablet, Rfl: 2 .  metoprolol succinate (TOPROL-XL) 25 MG 24 hr tablet, Take 1 tablet (25 mg total) by mouth daily., Disp: 30 tablet, Rfl: 5 .  pantoprazole (PROTONIX) 40  MG tablet, Take 1 tablet (40 mg total) by mouth daily., Disp: 30 tablet, Rfl: 11 .  pravastatin (PRAVACHOL) 40 MG tablet, Take 1 tablet (40 mg total) by mouth daily., Disp: 30 tablet, Rfl: 5 .  ranitidine (ZANTAC) 150 MG tablet, Take 1 tablet (150 mg total) by mouth at bedtime., Disp: 90 tablet, Rfl: 1 .  spironolactone (ALDACTONE) 50 MG tablet, Take 1 tablet (50 mg total) by mouth daily., Disp: 30 tablet, Rfl: 5 .  valACYclovir (VALTREX) 1000 MG tablet, Take 1 tablet (1,000 mg total) by mouth 2 (two) times daily., Disp: 30 tablet, Rfl: 1 .  ARIPiprazole (ABILIFY) 5 MG tablet, Take 1 tablet (5 mg total) by mouth daily., Disp: 30 tablet, Rfl: 5 .  busPIRone (BUSPAR) 15 MG tablet, Take 1 tablet (15 mg total) by mouth 2 (two) times daily. (Patient not taking: Reported on 05/12/2016), Disp: 60 tablet, Rfl: 5 .  gabapentin (NEURONTIN) 300 MG capsule, Take 1 capsule (300 mg total) by mouth 3 (three) times daily. (Patient not taking: Reported on 05/12/2016), Disp: 90 capsule, Rfl: 1  PAST MEDICAL HISTORY: Past Medical History:  Diagnosis Date  . Anxiety   . Cervical spondylosis   . Depression   . GERD (gastroesophageal reflux disease)   . Hyperlipidemia   . Hypertension   . IBS (irritable bowel syndrome)    Constipation with diarrhea.  . Oral herpes     PAST SURGICAL HISTORY: Past Surgical History:  Procedure Laterality Date  . ABDOMINAL HYSTERECTOMY  1978   DUB; ovaries intact.  . APPENDECTOMY  1988  . BREAST BIOPSY Bilateral 2009   Calcium Deposit- 1980's and also in 2009/ Right side 1995  . COLONOSCOPY  05/11/2009   Normal.  Previous colonoscopy with polyps. [facility].    . DEBRIDEMENT TENNIS ELBOW    . ROTATOR CUFF REPAIR      FAMILY HISTORY: Family History  Problem Relation Age of Onset  . Hyperlipidemia Mother   . Hypertension Mother   . Cancer Mother     lung  . Diabetes Mother   . Glaucoma Mother   . Stroke Mother 23  . Heart disease Mother   . Asthma Father   . Emphysema  Father   . COPD Father   . Glaucoma Father   . Hypertension Sister     SOCIAL HISTORY:  Social History   Social History  . Marital status: Divorced    Spouse name: N/A  . Number of children: 2  . Years of education: Bachelors   Occupational History  . 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   Social History Main Topics  . Smoking status: Never Smoker  . Smokeless tobacco: Never Used  . Alcohol use 0.6 oz/week    1 Glasses of wine per week     Comment: rare alcohol use; once per year on average.  . Drug use: No  . Sexual activity: Yes    Partners: Male    Birth control/ protection: Surgical   Other Topics Concern  . Not on file   Social History Narrative   Marital status: divorced since 1999; dating seriously in 2018 since 2015.  Children: one son, one daughter; 2 grandchildren.      Lives: alone in house with dogs     Employment:  Retired.      Tobacco: none      Alcohol: rare      Drugs: none      Exercise:  Sporadic   Right-handed.   Occasional caffeine use.      ADLs: independent with ADLs; drives; no assistant devices.      Advanced Directives: none; FULL CODE.     PHYSICAL EXAM  Vitals:   06/25/16 1145  BP: 104/62  Pulse: 68  Resp: 14  Weight: 135 lb 8 oz (61.5 kg)  Height: 5' 0.5" (1.537 m)    Body mass index is 26.03 kg/m.   General: The patient is well-developed and well-nourished and in no acute distress   Neurologic Exam  Mental status: The patient is alert and oriented x 3 at the time of the examination. The patient has apparent normal recent and remote memory, with an apparently normal attention span and concentration ability.   Speech is normal.  Cranial nerves: Extraocular movements are full. Pupils are equal, round, and reactive to light and accomodation.  Visual fields are full.  Facial symmetry is present. There is good mild reduced sensation to soft touch on the left.Facial strength is normal.  Trapezius and  sternocleidomastoid strength is normal. No dysarthria is noted.  The tongue is midline, and the patient has symmetric elevation of the soft palate. Hearing was slightly reduced on hr left and hte Weber lateralized to the right slightly   Motor:  Muscle bulk is normal.   Tone is normal. Strength is  5 / 5 in all 4 extremities.   Sensory:   On examination today, she had symmetric sensation to touch and temperature and vibration in the arms and legs when tested formally , Coordination: Cerebellar testing reveals good finger-nose-finger and heel-to-shin bilaterally.  Gait and station: Station is normal.   Gait is normal. Tandem gait is mildly wide. Romberg is negative.   Reflexes: Deep tendon reflexes are symmetric and normal bilaterally.   Plantar responses are flexor.    DIAGNOSTIC DATA (LABS, IMAGING, TESTING) - I reviewed patient records, labs, notes, testing and imaging myself where available.  Lab Results  Component Value Date   WBC 9.1 03/27/2016   HGB 14.2 03/27/2016   HCT 40.8 03/27/2016   MCV 92.1 03/27/2016   PLT 332 03/27/2016      Component Value Date/Time   NA 142 03/27/2016 1110   K 4.1 03/27/2016 1110   CL 105 03/27/2016 1110   CO2 27 03/27/2016 1110   GLUCOSE 103 (H) 03/27/2016 1110   BUN 8 03/27/2016 1110   CREATININE 0.89 03/27/2016 1110   CALCIUM 10.3 03/27/2016 1110   PROT 7.1 03/27/2016 1110   ALBUMIN 4.7 03/27/2016 1110   AST 14 03/27/2016 1110   ALT 16 03/27/2016 1110   ALKPHOS 72 03/27/2016 1110   BILITOT 0.8 03/27/2016 1110   GFRNONAA 58 (L) 03/19/2016 1317   GFRNONAA 86 10/18/2013 1528   GFRAA >60 03/19/2016 1317   GFRAA >89 10/18/2013 1528   Lab Results  Component Value Date   CHOL 197 02/11/2016   HDL 55 02/11/2016   LDLCALC 99 02/11/2016   TRIG 213 (H) 02/11/2016   CHOLHDL 3.6 02/11/2016   Lab Results  Component Value Date   HGBA1C 5.6 02/11/2016   No results found for: [id number] Lab Results  Component Value Date  TSH 3.506  03/19/2016       ASSESSMENT AND PLAN  Numbness  Loss of smell  Anxiety and depression  Insomnia due to other mental disorder  1.  Continue the Prozac but add Abilify 5 mg daily to see if that can help her anxiety symptoms and occasional agitation better. 2.    I reviewed the results of the MRI in her presence. The spinal stenosis is probably not severe enough to explain her symptoms. She does have mild chronic microvascular ischemic changes. 3.    Continue to exercise and remain active. 4.    Return in 6 months or sooner if there are new or worsening neurologic symptoms.  Marlies Ligman A. Felecia Shelling, MD, PhD [id number], [id number] PM Certified in Neurology, Clinical Neurophysiology, Sleep Medicine, Pain Medicine and Neuroimaging  [facility] [address] [place], [place] [address] [phone]

## 2016-06-25 NOTE — Therapy (Signed)
[facility] [address] [place], [address] Phone: [phone]   Fax:  [phone]  Physical Therapy Treatment  Patient Details  Name: Maria Sweeney MRN: [id number] Date of Birth: 08-11-1950 Referring Provider: Marylyn Ishihara L. Cabbell  Encounter Date: 06/25/2016      PT End of Session - 06/25/16 1329    Visit Number 4   Number of Visits 13   Date for PT Re-Evaluation 07/20/16   PT Start Time 1300   PT Stop Time 1342   PT Time Calculation (min) 42 min   Activity Tolerance Patient tolerated treatment well   Behavior During Therapy WFL for tasks assessed/performed      Past Medical History:  Diagnosis Date  . Anxiety   . Cervical spondylosis   . Depression   . GERD (gastroesophageal reflux disease)   . Hyperlipidemia   . Hypertension   . IBS (irritable bowel syndrome)    Constipation with diarrhea.  . Oral herpes     Past Surgical History:  Procedure Laterality Date  . ABDOMINAL HYSTERECTOMY  1978   DUB; ovaries intact.  . APPENDECTOMY  1988  . BREAST BIOPSY Bilateral 2009   Calcium Deposit- 1980's and also in 2009/ Right side 1995  . COLONOSCOPY  05/11/2009   Normal.  Previous colonoscopy with polyps. [facility].    . DEBRIDEMENT TENNIS ELBOW    . ROTATOR CUFF REPAIR      There were no vitals filed for this visit.      Subjective Assessment - 06/25/16 1322    Subjective Pt states that her neck is feeling great and that she has not had any pain since last visit and has not had any numbness and tingling into either arm since that time. She states that she can tell her ROM is still limited especially in cervical extension and L cervical rotation. She visited the neurologist today who according to her ruled out any other neurological causes of her other symptoms except stopping her medication at the end of october.   Pertinent History Chronic neck pain   Limitations House hold activities   Currently in  Pain? No/denies   Pain Score 0-No pain   Multiple Pain Sites No      Objective: Prone CPA 3x30 seconds grades 2-3 each level C3-T3. Unilateral PA to facilitate L cervical rotation grade 2-3 3x30 seconds each level grades 2-3. Pt stated that it was "tender" in the beginning of treatment but eased as treatment was progressed.  STM of posterior cervical musculature including cross friction and compression of upper traps, and levator scapula. Pt stated that treatment was "bearable" but uncomfortable.  Omega scapular retractions 2x12 15# with verbal and tactile cuing not to shrug shoulders but depress and retract scaupulas.  Omega chest press while holding cervical retraction 2x12 15# with verbal and tactile cuing not to flex cervical but retract. Required continual verbal cuing to maintain retraction.  Omega lat pull down 2x12 10# with verbal and tactile cuing not to shrug shoulders at onset of motion. Supine PROM B upper trap and levator scapula stretching 3x30 seconds each. Pt verbalized the feeling of a "good stretch" and felt like it "loosened up" throughout stretch.  Cervical circumduction x10 each direction with verbal cuing to eccentrically and slowly control neck motion.  PT Long Term Goals - 06/08/16 1804      PT LONG TERM GOAL #1   Title Pt. will be able to take dogs for short walk without pain afterwards to improve general conditioning.   Baseline Unable to walk dogs   Time 6   Period Weeks   Status New     PT LONG TERM GOAL #2   Title Pt NDI score will decrease by 6 points indicating decreased neck disability and improved overall function   Baseline Will fill out NDI at next session.   Time 6   Period Weeks   Status New     PT LONG TERM GOAL #3   Title Pt will be independent in HEP to be able to safely perform exercises at home to facilitate healing   Baseline Not independent   Time 6   Period Weeks   Status  New               Plan - 06/25/16 1342    Clinical Impression Statement Pt presents with continued decreased L cervical rotation and decreased cervical extension. She has had a decrease in pain associated with AROM. Pt HEP is going well and she has been performing them as prescribed. Unilateral joint mob of L rotation is hypomobile C3-C7. After joint mobilization treatment L cervical rotation increased and was less "tight". Continue progressing STM and joint mobilization and include more active ROM and strengthening.   Rehab Potential Fair   Clinical Impairments Affecting Rehab Potential Positive: Motivated, age           Negatives: Chronic pain, social support   PT Frequency 2x / week   PT Duration 6 weeks   PT Treatment/Interventions Manual techniques;Energy conservation;Passive range of motion;Therapeutic exercise;Therapeutic activities;Aquatic Therapy;Dry needling;ADLs/Self Care Home Management   PT Next Visit Plan Further assess mobility of spinal segments and musculature   Consulted and Agree with Plan of Care Patient      Patient will benefit from skilled therapeutic intervention in order to improve the following deficits and impairments:  Pain, Decreased activity tolerance, Decreased endurance, Decreased range of motion, Decreased mobility, Decreased strength, Impaired sensation, Impaired tone  Visit Diagnosis: Neck stiffness  Muscle weakness (generalized)  Radiculopathy, cervical region     Problem List Patient Active Problem List   Diagnosis Date Noted  . Numbness 04/13/2016  . Loss of smell 04/13/2016  . Pure hypercholesterolemia 11/30/2014  . Degenerative disc disease, cervical 04/24/2014  . Essential hypertension, benign 10/19/2013  . Insomnia 10/19/2013  . Herpes labialis 10/19/2013  . Anxiety and depression 07/25/2012  . GERD (gastroesophageal reflux disease) 07/25/2012    Marshe Shrestha PT DPT 06/25/2016, 2:13 PM  [facility] [address] [place], [address] Phone: [phone]   Fax:  [phone]  Name: Maria Sweeney MRN: [id number] Date of Birth: March 11, 1951

## 2016-07-01 ENCOUNTER — Ambulatory Visit: Payer: PPO | Admitting: Gastroenterology

## 2016-07-06 ENCOUNTER — Ambulatory Visit: Payer: PPO | Admitting: Physical Therapy

## 2016-07-09 ENCOUNTER — Encounter: Payer: Self-pay | Admitting: Physical Therapy

## 2016-07-09 ENCOUNTER — Ambulatory Visit: Payer: PPO | Attending: Neurosurgery | Admitting: Physical Therapy

## 2016-07-09 DIAGNOSIS — M6281 Muscle weakness (generalized): Secondary | ICD-10-CM | POA: Insufficient documentation

## 2016-07-09 DIAGNOSIS — M436 Torticollis: Secondary | ICD-10-CM | POA: Insufficient documentation

## 2016-07-13 ENCOUNTER — Ambulatory Visit: Payer: PPO | Admitting: Physical Therapy

## 2016-07-13 DIAGNOSIS — H401132 Primary open-angle glaucoma, bilateral, moderate stage: Secondary | ICD-10-CM | POA: Diagnosis not present

## 2016-07-16 ENCOUNTER — Ambulatory Visit: Payer: PPO | Admitting: Physical Therapy

## 2016-07-16 DIAGNOSIS — M6281 Muscle weakness (generalized): Secondary | ICD-10-CM | POA: Diagnosis not present

## 2016-07-16 DIAGNOSIS — M436 Torticollis: Secondary | ICD-10-CM | POA: Diagnosis not present

## 2016-07-16 NOTE — Therapy (Signed)
[facility] [address], [place], [address] Phone: [phone]   Fax:  [phone]  Physical Therapy Treatment  Patient Details  Name: Maria Sweeney MRN: [id number] Date of Birth: [birth year] Referring Provider: Marylyn Ishihara L. Cabbell  Encounter Date: 07/16/2016      PT End of Session - 07/16/16 0826    Visit Number 6   Number of Visits 13   Date for PT Re-Evaluation 07/20/16   PT Start Time 0820   PT Stop Time 0900   PT Time Calculation (min) 40 min   Activity Tolerance Patient tolerated treatment well   Behavior During Therapy [facility] for tasks assessed/performed      Past Medical History:  Diagnosis Date  . Anxiety   . Cervical spondylosis   . Depression   . GERD (gastroesophageal reflux disease)   . Hyperlipidemia   . Hypertension   . IBS (irritable bowel syndrome)    Constipation with diarrhea.  . Oral herpes     Past Surgical History:  Procedure Laterality Date  . ABDOMINAL HYSTERECTOMY  1978   DUB; ovaries intact.  . APPENDECTOMY  1988  . BREAST BIOPSY Bilateral 2009   Calcium Deposit- 1980's and also in 2009/ Right side 1995  . COLONOSCOPY  05/11/2009   Normal.  Previous colonoscopy with polyps. [facility].    . DEBRIDEMENT TENNIS ELBOW    . ROTATOR CUFF REPAIR      There were no vitals filed for this visit.      Subjective Assessment - 07/16/16 0821    Subjective Pt reports her neck has been more irritated lately, it feels like something is "pushing" on her neck.    Pertinent History Chronic neck pain   Limitations House hold activities   Currently in Pain? No/denies                  Objective: Prone CPAs grade II 5x1 min T1-T5.  Light STM performed on L levator using compression.   Prone L UPAs grade 3x30 sec. Grade I due to significant pain with performance of this.  ROM following this improved considerably with decr. In pain.  Standing arm swings 3x1 min  forward/back, 2x1 min side to side with cuing to relax shoulders.  Reviewed and had pt perform OMEGA scapular retractions with 15# 2x10, manual tapping to facilitate this.  Issued arm swings as HEP.                    PT Long Term Goals - 06/08/16 1804      PT LONG TERM GOAL #1   Title Pt. will be able to take dogs for short walk without pain afterwards to improve general conditioning.   Baseline Unable to walk dogs   Time 6   Period Weeks   Status New     PT LONG TERM GOAL #2   Title Pt NDI score will decrease by 6 points indicating decreased neck disability and improved overall function   Baseline Will fill out NDI at next session.   Time 6   Period Weeks   Status New     PT LONG TERM GOAL #3   Title Pt will be independent in HEP to be able to safely perform exercises at home to facilitate healing   Baseline Not independent   Time 6   Period Weeks   Status New  Plan - 07/16/16 0826    Clinical Impression Statement As pt had a worse time since prevoius session focussed on pain relief and light relaxation/strengthening exercises. At the end of session pt had improved cervical AROM by 25degr. rotation B and reported significantly decr. "tightness".   Rehab Potential Fair   Clinical Impairments Affecting Rehab Potential Positive: Motivated, age           Negatives: Chronic pain, social support   PT Frequency 2x / week   PT Duration 6 weeks   PT Treatment/Interventions Manual techniques;Energy conservation;Passive range of motion;Therapeutic exercise;Therapeutic activities;Aquatic Therapy;Dry needling;ADLs/Self Care Home Management   PT Next Visit Plan Further assess mobility of spinal segments and musculature   Consulted and Agree with Plan of Care Patient      Patient will benefit from skilled therapeutic intervention in order to improve the following deficits and impairments:  Pain, Decreased activity tolerance, Decreased endurance,  Decreased range of motion, Decreased mobility, Decreased strength, Impaired sensation, Impaired tone  Visit Diagnosis: Neck stiffness     Problem List Patient Active Problem List   Diagnosis Date Noted  . Tinnitus 06/25/2016  . Numbness 04/13/2016  . Loss of smell 04/13/2016  . Pure hypercholesterolemia 11/30/2014  . Degenerative disc disease, cervical 04/24/2014  . Essential hypertension, benign 10/19/2013  . Insomnia 10/19/2013  . Herpes labialis 10/19/2013  . Anxiety and depression 07/25/2012  . GERD (gastroesophageal reflux disease) 07/25/2012    Gaege Sangalang PT DPT 07/16/2016, 9:25 AM  [facility] [address] [place], [address] Phone: [phone]   Fax:  [phone]  Name: Maria Sweeney MRN: [id number] Date of Birth: 12/03/1950

## 2016-07-20 ENCOUNTER — Ambulatory Visit: Payer: PPO | Admitting: Physical Therapy

## 2016-07-23 ENCOUNTER — Ambulatory Visit: Payer: PPO | Admitting: Physical Therapy

## 2016-07-27 ENCOUNTER — Ambulatory Visit: Payer: PPO | Admitting: Physical Therapy

## 2016-07-28 ENCOUNTER — Other Ambulatory Visit: Payer: Self-pay | Admitting: Family Medicine

## 2016-07-28 DIAGNOSIS — Z1231 Encounter for screening mammogram for malignant neoplasm of breast: Secondary | ICD-10-CM

## 2016-07-30 ENCOUNTER — Ambulatory Visit: Payer: PPO

## 2016-07-30 DIAGNOSIS — M436 Torticollis: Secondary | ICD-10-CM | POA: Diagnosis not present

## 2016-07-30 DIAGNOSIS — M6281 Muscle weakness (generalized): Secondary | ICD-10-CM

## 2016-07-30 NOTE — Therapy (Signed)
[facility] [address] [place], [address] Phone: [phone]   Fax:  [phone]  Physical Therapy Treatment  Patient Details  Name: Maria Sweeney MRN: [id number] Date of Birth: 01-17-1951 Referring Provider: Marylyn Ishihara L. Cabbell  Encounter Date: 07/30/2016      PT End of Session - 07/30/16 1336    Visit Number 7   Number of Visits 13   Date for PT Re-Evaluation 09/10/16   PT Start Time 1300   PT Stop Time 1345   PT Time Calculation (min) 45 min   Activity Tolerance Patient tolerated treatment well   Behavior During Therapy [facility] for tasks assessed/performed      Past Medical History:  Diagnosis Date  . Anxiety   . Cervical spondylosis   . Depression   . GERD (gastroesophageal reflux disease)   . Hyperlipidemia   . Hypertension   . IBS (irritable bowel syndrome)    Constipation with diarrhea.  . Oral herpes     Past Surgical History:  Procedure Laterality Date  . ABDOMINAL HYSTERECTOMY  1978   DUB; ovaries intact.  . APPENDECTOMY  1988  . BREAST BIOPSY Bilateral 2009   Calcium Deposit- 1980's and also in 2009/ Right side 1995  . COLONOSCOPY  05/11/2009   Normal.  Previous colonoscopy with polyps. [facility].    . DEBRIDEMENT TENNIS ELBOW    . ROTATOR CUFF REPAIR      There were no vitals filed for this visit.      Subjective Assessment - 07/30/16 1334    Subjective Pt reports she is doing well today without any pain. However she has continued to have persistent neck pain and stiffness since her last therapy appointment. She believes that physical therapy has helped her pain. She has no-showed or cancelled the majority of her appointments since starting therapy. She reports that she is also not performing her home exercises consistently. She would like to contine with physical therapy.    Pertinent History Chronic neck pain   Limitations House hold activities   Currently in Pain? No/denies             [facility] PT Assessment - 07/30/16 1341      Observation/Other Assessments   Other Surveys  Other Surveys   Neck Disability Index  28%     ROM / Strength   AROM / PROM / Strength AROM     AROM   AROM Assessment Site Cervical   Cervical Flexion 42   Cervical Extension 35   Cervical - Right Side Bend 23   Cervical - Left Side Bend 20   Cervical - Right Rotation 70   Cervical - Left Rotation 60         Objective:   Manual Therapy Cervical AROM measurements obtained; Pt completed quick DASH (unbilled); Spinal mobility testing, pain reported with CPA and UPA in varied pattern C3-T4 CPA C3-T3, grade I, spasm noted, 2x30 seconds each segment; Seated L rotation mobilization with movement applying PA and R unilateral PA pressure to C6-C7 x 10;  Ther-ex Seated cervical retractions x 10 to ensure proper form; Seated scapular retraction/low row with RTB x 10, cues to keep shoulders depressed; Seatedseated levator scapula stretch and performed 2x30". Pt requires cues for correct technique; Written HEP provided and reviewed with patient                    PT Education - 07/30/16 1336  Education provided Yes   Education Details HEP reviewed and reinforced, plan of care   Person(s) Educated Patient   Methods Explanation;Verbal cues;Handout;Tactile cues   Comprehension Verbalized understanding;Returned demonstration             PT Long Term Goals - August 24, 2016 1333      PT LONG TERM GOAL #1   Title Pt. will be able to take dogs for short walk without pain afterwards to improve general conditioning.   Baseline Unable to walk dogs   Time 6   Period Weeks   Status On-going     PT LONG TERM GOAL #2   Title Pt NDI score will decrease by 6 points indicating decreased neck disability and improved overall function   Baseline 24-Aug-2016: 28%   Time 6   Period Weeks   Status New     PT LONG TERM GOAL #3   Title Pt will be independent in HEP to be  able to safely perform exercises at home to facilitate healing   Baseline Not independent   Time 6   Period Weeks   Status On-going     PT LONG TERM GOAL #4   Title Pt will improve L cervical rotation AROM to equal R rotation in order to improve ability to look over shoulder while driving   Baseline 0/35/00: L rotation 60 degrees, R rotation 70 degrees   Time 6   Period Weeks   Status New               Plan - 2016-08-24 1336    Clinical Impression Statement Pt reports that therapy has helped her pain and she would like to continue. However she has not arrived or cancelled the majority of her apointments. She also denies performing her home exercise program consistently. She continues to demonstrate restrictions in cervical AROM, mostly notable with left rotation and extension. We will continue to treat patient however if she continues to not come to her schedule appointments we will have to discharge her from therapy services. Her NDI on this date is 28% which indicates persistent disability related to neck pain.    Rehab Potential Fair   Clinical Impairments Affecting Rehab Potential Positive: Motivated, age           Negatives: Chronic pain, social support   PT Frequency 2x / week   PT Duration 6 weeks   PT Treatment/Interventions Manual techniques;Energy conservation;Passive range of motion;Therapeutic exercise;Therapeutic activities;Aquatic Therapy;Dry needling;ADLs/Self Care Home Management   PT Next Visit Plan Progress cervical mobility and advance with DNF strengthening   PT Home Exercise Plan chin tucks, arm swings, low rows, cervical rotation stretches,    Consulted and Agree with Plan of Care Patient      Patient will benefit from skilled therapeutic intervention in order to improve the following deficits and impairments:  Pain, Decreased activity tolerance, Decreased endurance, Decreased range of motion, Decreased mobility, Decreased strength, Impaired sensation, Impaired  tone  Visit Diagnosis: Neck stiffness - Plan: PT plan of care cert/re-cert  Muscle weakness (generalized) - Plan: PT plan of care cert/re-cert       G-Codes - August 24, 2016 1339    Functional Assessment Tool Used (Outpatient Only) NDI, AROM, clinical judgement   Functional Limitation Changing and maintaining body position   Changing and Maintaining Body Position Current Status (X3818) At least 20 percent but less than 40 percent impaired, limited or restricted   Changing and Maintaining Body Position Goal Status (E9937) At least 1 percent but  less than 20 percent impaired, limited or restricted      Problem List Patient Active Problem List   Diagnosis Date Noted  . Tinnitus 06/25/2016  . Numbness 04/13/2016  . Loss of smell 04/13/2016  . Pure hypercholesterolemia 11/30/2014  . Degenerative disc disease, cervical 04/24/2014  . Essential hypertension, benign 10/19/2013  . Insomnia 10/19/2013  . Herpes labialis 10/19/2013  . Anxiety and depression 07/25/2012  . GERD (gastroesophageal reflux disease) 07/25/2012   Phillips Grout PT, DPT   Timur Nibert 07/31/2016, 3:57 PM  [place] [facility] [address] [place], [address] Phone: [phone]   Fax:  [phone]  Name: BREELEY BISCHOF MRN: [id number] Date of Birth: 1950/07/02

## 2016-08-03 ENCOUNTER — Ambulatory Visit: Payer: PPO | Admitting: Physical Therapy

## 2016-08-05 ENCOUNTER — Other Ambulatory Visit: Payer: Self-pay | Admitting: Family Medicine

## 2016-08-06 ENCOUNTER — Ambulatory Visit: Payer: PPO

## 2016-08-06 DIAGNOSIS — M436 Torticollis: Secondary | ICD-10-CM | POA: Diagnosis not present

## 2016-08-06 DIAGNOSIS — M6281 Muscle weakness (generalized): Secondary | ICD-10-CM

## 2016-08-06 NOTE — Therapy (Signed)
[place] [facility] [address] [place], [address] Phone: [phone]   Fax:  [phone]  Physical Therapy Treatment  Patient Details  Name: Maria Sweeney MRN: [id number] Date of Birth: 05-20-1950 Referring Provider: Marylyn Ishihara L. Cabbell  Encounter Date: 08/06/2016      PT End of Session - 08/06/16 1337    Visit Number 8   Number of Visits 13   Date for PT Re-Evaluation 09/10/16   PT Start Time 1330   PT Stop Time 1415   PT Time Calculation (min) 45 min   Activity Tolerance Patient tolerated treatment well   Behavior During Therapy [facility] for tasks assessed/performed      Past Medical History:  Diagnosis Date  . Anxiety   . Cervical spondylosis   . Depression   . GERD (gastroesophageal reflux disease)   . Hyperlipidemia   . Hypertension   . IBS (irritable bowel syndrome)    Constipation with diarrhea.  . Oral herpes     Past Surgical History:  Procedure Laterality Date  . ABDOMINAL HYSTERECTOMY  1978   DUB; ovaries intact.  . APPENDECTOMY  1988  . BREAST BIOPSY Bilateral 2009   Calcium Deposit- 1980's and also in 2009/ Right side 1995  . COLONOSCOPY  05/11/2009   Normal.  Previous colonoscopy with polyps. [facility].    . DEBRIDEMENT TENNIS ELBOW    . ROTATOR CUFF REPAIR      There were no vitals filed for this visit.      Subjective Assessment - 08/06/16 1332    Subjective Pt reports that she had improved mobility in her neck after the last session but noticed some increased soreness. She complains of some R posterior neck soreness at this time and states that her RUE feels like it is going to cramp. She states that she is performing HEP and notes some increased relaxation afterwards.    Pertinent History Chronic neck pain   Limitations House hold activities   Currently in Pain? Yes   Pain Score 7    Pain Location Shoulder   Pain Orientation Right   Pain Descriptors / Indicators Aching;Cramping   Pain Type Chronic pain   Pain Radiating Towards RUE   Pain Onset More than a month ago   Pain Frequency Intermittent         Objective:   Manual Therapy MHP applied to neck during history; Gentle cervical manual traction, pt reports complete resolution of pain and RUE symptoms with manual traction; CPA C2-C5, grade I, no spasm noted today, 2x30 seconds each segment; Seated L rotation mobilization with movement applying PA and R unilateral PA pressure to C6-C7 x 10; Seated SNAG rotation stretch for L cervical flexion 30s x 3;  Ther-ex Seated cervical retractions x 10 to ensure proper form; DNF strengthening with chin tuck followed by head lift in supine, heavy cues to avoid superficial muscle activation, 5s hold x 10;  Mechanical Traction Note: Performed following and completely separate of manual interventions  Mechanic cervical traction with 25 degree pull, 15# pull x 20 seconds, 10# release x 10 seconds x 10 minutes total, pt reports complete resolution of her pain following;                         PT Education - 08/06/16 1337    Education provided Yes   Education Details HEP reinforced   Person(s) Educated Patient   Methods Explanation  Comprehension Verbalized understanding             PT Long Term Goals - 07/30/16 1333      PT LONG TERM GOAL #1   Title Pt. will be able to take dogs for short walk without pain afterwards to improve general conditioning.   Baseline Unable to walk dogs   Time 6   Period Weeks   Status On-going     PT LONG TERM GOAL #2   Title Pt NDI score will decrease by 6 points indicating decreased neck disability and improved overall function   Baseline 07/30/16: 28%   Time 6   Period Weeks   Status New     PT LONG TERM GOAL #3   Title Pt will be independent in HEP to be able to safely perform exercises at home to facilitate healing   Baseline Not independent   Time 6   Period Weeks   Status On-going      PT LONG TERM GOAL #4   Title Pt will improve L cervical rotation AROM to equal R rotation in order to improve ability to look over shoulder while driving   Baseline 11/06/45: L rotation 60 degrees, R rotation 70 degrees   Time 6   Period Weeks   Status New               Plan - 08/06/16 1337    Clinical Impression Statement Pt responds very well to manual cervical traction assessed in supine with complete resolution of her neck pain and RUE symptoms. Performed separate mechanical traction to assess patient response after session. She also reports some relief with DNF strengthening which will be continued at next session. Pt encouraged to continue HEP and follow-up as scheduled.    Rehab Potential Fair   Clinical Impairments Affecting Rehab Potential Positive: Motivated, age           Negatives: Chronic pain, social support   PT Frequency 2x / week   PT Duration 6 weeks   PT Treatment/Interventions Manual techniques;Energy conservation;Passive range of motion;Therapeutic exercise;Therapeutic activities;Aquatic Therapy;Dry needling;ADLs/Self Care Home Management   PT Next Visit Plan Progress cervical mobility and advance with DNF strengthening   PT Home Exercise Plan chin tucks, arm swings, low rows, cervical rotation stretches,    Consulted and Agree with Plan of Care Patient      Patient will benefit from skilled therapeutic intervention in order to improve the following deficits and impairments:  Pain, Decreased activity tolerance, Decreased endurance, Decreased range of motion, Decreased mobility, Decreased strength, Impaired sensation, Impaired tone  Visit Diagnosis: Neck stiffness  Muscle weakness (generalized)     Problem List Patient Active Problem List   Diagnosis Date Noted  . Tinnitus 06/25/2016  . Numbness 04/13/2016  . Loss of smell 04/13/2016  . Pure hypercholesterolemia 11/30/2014  . Degenerative disc disease, cervical 04/24/2014  . Essential hypertension,  benign 10/19/2013  . Insomnia 10/19/2013  . Herpes labialis 10/19/2013  . Anxiety and depression 07/25/2012  . GERD (gastroesophageal reflux disease) 07/25/2012   Phillips Grout PT, DPT   Huprich,Jason 08/07/2016, 3:21 PM  [place] [facility] [address] [place], [address] Phone: [phone]   Fax:  [phone]  Name: IKEYA BROCKEL MRN: [id number] Date of Birth: [birth year]

## 2016-08-10 ENCOUNTER — Ambulatory Visit: Payer: PPO | Attending: Neurosurgery

## 2016-08-10 DIAGNOSIS — M436 Torticollis: Secondary | ICD-10-CM | POA: Diagnosis not present

## 2016-08-10 DIAGNOSIS — M6281 Muscle weakness (generalized): Secondary | ICD-10-CM | POA: Insufficient documentation

## 2016-08-10 NOTE — Therapy (Signed)
[place] [facility] [address] [place], [address] Phone: [phone]   Fax:  [phone]  Physical Therapy Treatment  Patient Details  Name: Maria Sweeney MRN: [id number] Date of Birth: 06/15/1950 Referring Provider: Marylyn Ishihara L. Cabbell  Encounter Date: 08/10/2016      PT End of Session - 08/10/16 1117    Visit Number 9   Number of Visits 13   Date for PT Re-Evaluation 09/10/16   PT Start Time 1115   PT Stop Time 1200   PT Time Calculation (min) 45 min   Activity Tolerance Patient tolerated treatment well   Behavior During Therapy [facility] for tasks assessed/performed      Past Medical History:  Diagnosis Date  . Anxiety   . Cervical spondylosis   . Depression   . GERD (gastroesophageal reflux disease)   . Hyperlipidemia   . Hypertension   . IBS (irritable bowel syndrome)    Constipation with diarrhea.  . Oral herpes     Past Surgical History:  Procedure Laterality Date  . ABDOMINAL HYSTERECTOMY  1978   DUB; ovaries intact.  . APPENDECTOMY  1988  . BREAST BIOPSY Bilateral 2009   Calcium Deposit- 1980's and also in 2009/ Right side 1995  . COLONOSCOPY  05/11/2009   Normal.  Previous colonoscopy with polyps. [facility].    . DEBRIDEMENT TENNIS ELBOW    . ROTATOR CUFF REPAIR      There were no vitals filed for this visit.      Subjective Assessment - 08/10/16 1117    Subjective Pt states significnat improvement in cervical mobility and pain following last session. She believes that the mechanical traction made a big difference. No neck pain or radicular symptoms upon arrival today.    Pertinent History Chronic neck pain   Limitations House hold activities   Currently in Pain? No/denies             Objective:   Manual Therapy CPA C2-C5, grade II, no spasm noted today, 2x30 seconds each segment; Seated L rotation mobilization with movement applying PA and R unilateral PA pressure to C6-C7 x  10; Bilateral upper trap and levator stretches 30s hold x 2;  Ther-ex Seated cervical retractions 2 x 5; DNF strengthening with chin tuck followed by head lift in supine, heavy cues to avoid superficial muscle activation, 5s hold 2 x 5;  Mechanical Traction Mechanic cervical traction with 25 degree pull, 18# pull x 20 seconds, 12# release x 10 seconds x 15 minutes total, pt reports complete resolution of her pain following;                     PT Education - 08/10/16 1117    Education provided Yes   Education Details HEP reinforced   Person(s) Educated Patient   Methods Explanation   Comprehension Verbalized understanding             PT Long Term Goals - 07/30/16 1333      PT LONG TERM GOAL #1   Title Pt. will be able to take dogs for short walk without pain afterwards to improve general conditioning.   Baseline Unable to walk dogs   Time 6   Period Weeks   Status On-going     PT LONG TERM GOAL #2   Title Pt NDI score will decrease by 6 points indicating decreased neck disability and improved overall function   Baseline 07/30/16: 28%   Time  6   Period Weeks   Status New     PT LONG TERM GOAL #3   Title Pt will be independent in HEP to be able to safely perform exercises at home to facilitate healing   Baseline Not independent   Time 6   Period Weeks   Status On-going     PT LONG TERM GOAL #4   Title Pt will improve L cervical rotation AROM to equal R rotation in order to improve ability to look over shoulder while driving   Baseline 1/61/09: L rotation 60 degrees, R rotation 70 degrees   Time 6   Period Weeks   Status New               Plan - 08/10/16 1216    Clinical Impression Statement Pt responded very well to mechanical traction last session so repeated again today. Minimally increased pull and rest forces to 18#/12# respectively. Pt demonstrates improved cervical AROM at end of session to 65 L and 75R compared to 60 and 70 degrees  last week. No HEP progression provided on this date. Pt encouraged to contnue current HEP and follow-up as scheduled.    Rehab Potential Fair   Clinical Impairments Affecting Rehab Potential Positive: Motivated, age           Negatives: Chronic pain, social support   PT Frequency 2x / week   PT Duration 6 weeks   PT Treatment/Interventions Manual techniques;Energy conservation;Passive range of motion;Therapeutic exercise;Therapeutic activities;Aquatic Therapy;Dry needling;ADLs/Self Care Home Management   PT Next Visit Plan Progress cervical mobility and advance with DNF strengthening, mechanical traction if pt continues to report relief and improved mobility   PT Home Exercise Plan chin tucks, arm swings, low rows, cervical rotation stretches,    Consulted and Agree with Plan of Care Patient      Patient will benefit from skilled therapeutic intervention in order to improve the following deficits and impairments:  Pain, Decreased activity tolerance, Decreased endurance, Decreased range of motion, Decreased mobility, Decreased strength, Impaired sensation, Impaired tone  Visit Diagnosis: Neck stiffness  Muscle weakness (generalized)     Problem List Patient Active Problem List   Diagnosis Date Noted  . Tinnitus 06/25/2016  . Numbness 04/13/2016  . Loss of smell 04/13/2016  . Pure hypercholesterolemia 11/30/2014  . Degenerative disc disease, cervical 04/24/2014  . Essential hypertension, benign 10/19/2013  . Insomnia 10/19/2013  . Herpes labialis 10/19/2013  . Anxiety and depression 07/25/2012  . GERD (gastroesophageal reflux disease) 07/25/2012   Phillips Grout PT, DPT   Huprich,Jason 08/10/2016, 12:23 PM  [place] [facility] [address], [place], [address] Phone: [phone]   Fax:  [phone]  Name: Maria Sweeney MRN: [id number] Date of Birth: August 27, 1950

## 2016-08-11 ENCOUNTER — Encounter: Payer: Self-pay | Admitting: Family Medicine

## 2016-08-11 ENCOUNTER — Ambulatory Visit (INDEPENDENT_AMBULATORY_CARE_PROVIDER_SITE_OTHER): Payer: PPO | Admitting: Family Medicine

## 2016-08-11 VITALS — BP 136/79 | HR 56 | Temp 98.3°F | Resp 16 | Ht 65.0 in | Wt 137.0 lb

## 2016-08-11 DIAGNOSIS — F5105 Insomnia due to other mental disorder: Secondary | ICD-10-CM | POA: Diagnosis not present

## 2016-08-11 DIAGNOSIS — G5701 Lesion of sciatic nerve, right lower limb: Secondary | ICD-10-CM | POA: Diagnosis not present

## 2016-08-11 DIAGNOSIS — F99 Mental disorder, not otherwise specified: Secondary | ICD-10-CM | POA: Diagnosis not present

## 2016-08-11 DIAGNOSIS — M5030 Other cervical disc degeneration, unspecified cervical region: Secondary | ICD-10-CM

## 2016-08-11 DIAGNOSIS — F329 Major depressive disorder, single episode, unspecified: Secondary | ICD-10-CM

## 2016-08-11 DIAGNOSIS — R430 Anosmia: Secondary | ICD-10-CM

## 2016-08-11 DIAGNOSIS — K219 Gastro-esophageal reflux disease without esophagitis: Secondary | ICD-10-CM | POA: Diagnosis not present

## 2016-08-11 DIAGNOSIS — I10 Essential (primary) hypertension: Secondary | ICD-10-CM

## 2016-08-11 DIAGNOSIS — E7800 Pure hypercholesterolemia, unspecified: Secondary | ICD-10-CM

## 2016-08-11 DIAGNOSIS — F419 Anxiety disorder, unspecified: Secondary | ICD-10-CM | POA: Diagnosis not present

## 2016-08-11 DIAGNOSIS — R200 Anesthesia of skin: Secondary | ICD-10-CM

## 2016-08-11 DIAGNOSIS — G5702 Lesion of sciatic nerve, left lower limb: Secondary | ICD-10-CM

## 2016-08-11 NOTE — Progress Notes (Signed)
Subjective:    Patient ID: Maria Sweeney, female    DOB: [birth year], 66 y.o.   MRN: [id number]  08/11/2016  Follow-up (3 months)   HPI This 66 y.o. female presents for three month follow-up of hypertension, hypercholesterolemia, anxiety/depression. Weight continues to increase; has never been this heavy; clothes do not fit.  Wants to get a job but no clothes.  Not exercising due to B hip pain; went for a walk last night because wants to exercise.  Hips tighten up with walking.  Also having B hip pain when not exercising.  No stretching; not walking much.  Does not like to walk alone.  No previous hip problems.    Has been undergoing physical therapy for DDD cervical; range of motion is much improved; underwent traction last week and responding really well.  Going twice weekly.  Has really helped.  Pain is minimal.  Intermittent stiff neck.  Has energy.  Abilify seems to help.  Started Abilify less than one month ago.  Started with 1/2 a day at first.  Hesitant to start something else.  Now taking one whole Abilify; two weeks after starting Abilify.   Less hot flashes lately; much better.  Was having a lot of hot flashes.  Panic attacks were occurring; last episode three weeks ago.  Did not know what panic attacks were at the time.  Was getting really scared.  Got really shaky.  Did not feel anxious.  Has only had four since last visit. Duration of panic attacks thirty minutes.  Has some numbness in mouth.  L side has a numb feeling; still gets numb sensation in mouth; two weeks ago, now can taste and smell better.    DDD cervical spine: methocarbamol qhs.  GERD: Protonix and Zantac.   Immunization History  Administered Date(s) Administered  . Influenza,inj,Quad PF,36+ Mos 02/22/2015, 02/11/2016  . Pneumococcal Conjugate-13 04/26/2015  . Pneumococcal Polysaccharide-23 05/12/2016   BP Readings from Last 3 Encounters:  08/31/16 130/90  08/11/16 136/79  06/25/16 104/62   Wt Readings from  Last 3 Encounters:  08/31/16 136 lb 6 oz (61.9 kg)  08/11/16 137 lb (62.1 kg)  06/25/16 135 lb 8 oz (61.5 kg)    Review of Systems  Constitutional: Negative for chills, diaphoresis, fatigue and fever.  Eyes: Negative for visual disturbance.  Respiratory: Negative for cough and shortness of breath.   Cardiovascular: Negative for chest pain, palpitations and leg swelling.  Gastrointestinal: Negative for abdominal pain, constipation, diarrhea, nausea and vomiting.  Endocrine: Negative for cold intolerance, heat intolerance, polydipsia, polyphagia and polyuria.  Musculoskeletal: Positive for arthralgias, neck pain and neck stiffness.  Neurological: Positive for numbness. Negative for dizziness, tremors, seizures, syncope, facial asymmetry, speech difficulty, weakness, light-headedness and headaches.  Psychiatric/Behavioral: Negative for dysphoric mood, self-injury, sleep disturbance and suicidal ideas. The patient is nervous/anxious.     Past Medical History:  Diagnosis Date  . Anxiety   . Cervical spondylosis   . Depression   . GERD (gastroesophageal reflux disease)   . Hyperlipidemia   . Hypertension   . IBS (irritable bowel syndrome)    Constipation with diarrhea.  . Oral herpes    Past Surgical History:  Procedure Laterality Date  . ABDOMINAL HYSTERECTOMY  1978   DUB; ovaries intact.  . APPENDECTOMY  1988  . BREAST BIOPSY Bilateral 2009   Calcium Deposit- 1980's and also in 2009/ Right side 1995  . BREAST EXCISIONAL BIOPSY Bilateral   . COLONOSCOPY  05/11/2009   Normal.  Previous colonoscopy with polyps. [facility].    . DEBRIDEMENT TENNIS ELBOW    . ROTATOR CUFF REPAIR     Allergies  Allergen Reactions  . Amoxicillin   . Augmentin [Amoxicillin-Pot Clavulanate]   . Codeine   . Depakote [Divalproex Sodium]   . Latex   . Lipitor [Atorvastatin]   . Penicillins   . Septra [Sulfamethoxazole-Trimethoprim]   . Zocor [Simvastatin]     Social History   Social History  .  Marital status: Divorced    Spouse name: N/A  . Number of children: 2  . Years of education: Bachelors   Occupational History  . 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   Social History Main Topics  . Smoking status: Never Smoker  . Smokeless tobacco: Never Used  . Alcohol use 0.6 oz/week    1 Glasses of wine per week     Comment: rare alcohol use; once per year on average.  . Drug use: No  . Sexual activity: Yes    Partners: Male    Birth control/ protection: Surgical   Other Topics Concern  . Not on file   Social History Narrative   Marital status: divorced since 1999; dating seriously in 2018 since 2015.      Children: one son, one daughter; 2 grandchildren.      Lives: alone in house with dogs     Employment:  Retired.      Tobacco: none      Alcohol: rare      Drugs: none      Exercise:  Sporadic   Right-handed.   Occasional caffeine use.      ADLs: independent with ADLs; drives; no assistant devices.      Advanced Directives: none; FULL CODE.   Family History  Problem Relation Age of Onset  . Hyperlipidemia Mother   . Hypertension Mother   . Cancer Mother     lung  . Diabetes Mother   . Glaucoma Mother   . Stroke Mother 48  . Heart disease Mother   . Asthma Father   . Emphysema Father   . COPD Father   . Glaucoma Father   . Hypertension Sister   . Breast cancer Neg Hx        Objective:    BP 136/79   Pulse (!) 56   Temp 98.3 F (36.8 C) (Oral)   Resp 16   Ht 5\' 5"  (1.651 m)   Wt 137 lb (62.1 kg)   SpO2 96%   BMI 22.80 kg/m  Physical Exam  Constitutional: She is oriented to person, place, and time. She appears well-developed and well-nourished. No distress.  HENT:  Head: Normocephalic and atraumatic.  Right Ear: External ear normal.  Left Ear: External ear normal.  Nose: Nose normal.  Mouth/Throat: Oropharynx is clear and moist.  Eyes: Conjunctivae and EOM are normal. Pupils are equal, round, and reactive to light.  Neck:  Normal range of motion. Neck supple. Carotid bruit is not present. No thyromegaly present.  Cardiovascular: Normal rate, regular rhythm, normal heart sounds and intact distal pulses.  Exam reveals no gallop and no friction rub.   No murmur heard. Pulmonary/Chest: Effort normal and breath sounds normal. She has no wheezes. She has no rales.  Abdominal: Soft. Bowel sounds are normal. She exhibits no distension and no mass. There is no tenderness. There is no rebound and no guarding.  Musculoskeletal:       Right hip: She  exhibits normal range of motion, normal strength and no tenderness.       Left hip: She exhibits normal range of motion, normal strength, no tenderness and no bony tenderness.       Cervical back: Normal.       Lumbar back: Normal. She exhibits normal range of motion, no tenderness and no bony tenderness.  Lymphadenopathy:    She has no cervical adenopathy.  Neurological: She is alert and oriented to person, place, and time. No cranial nerve deficit.  Skin: Skin is warm and dry. No rash noted. She is not diaphoretic. No erythema. No pallor.  Psychiatric: She has a normal mood and affect. Her behavior is normal.   Depression screen [facility] 2/9 08/11/2016 05/12/2016 04/01/2016 03/21/2016 03/21/2016  Decreased Interest 0 0 1 0 0  Down, Depressed, Hopeless 0 0 3 0 0  PHQ - 2 Score 0 0 4 0 0  Altered sleeping - - 3 - -  Tired, decreased energy - - 3 - -  Change in appetite - - 0 - -  Feeling bad or failure about yourself  - - 2 - -  Trouble concentrating - - 3 - -  Moving slowly or fidgety/restless - - 3 - -  Suicidal thoughts - - 0 - -  PHQ-9 Score - - 18 - -        Assessment & Plan:   1. Pure hypercholesterolemia   2. Essential hypertension, benign   3. Gastroesophageal reflux disease without esophagitis   4. Degenerative disc disease, cervical   5. Anxiety and depression   6. Insomnia due to other mental disorder   7. Numbness   8. Loss of smell   9. Piriformis  syndrome of both sides    -blood pressure well controlled; obtain labs; continue current medications. -obtain FLP; continue current medications. -anxiety and depression much improved with Abilify and off of benzos.  No changes to therapy at this time; encourage daily exercise. -DDD cervical spine with improved range of motion with physical therapy; pain much improved and nearly resolved currently. -facial and mouth numbness persistent; s/p neurology consultation; doing well. No worsening. -new onset piriformis syndrome; home exercise program provided to perform daily.   Orders Placed This Encounter  Procedures  . CBC with Differential/Platelet  . Comprehensive metabolic panel    Order Specific Question:   Has the patient fasted?    Answer:   Yes  . Lipid panel    Order Specific Question:   Has the patient fasted?    Answer:   Yes   No orders of the defined types were placed in this encounter.   Return in about 6 months (around 02/10/2017) for recheck high blood pressure, high cholesterol.   Rebeka Kimble Elayne Guerin, M.D. Primary Care at [facility] [address] [place], [place]  [address] [phone] phone [phone] fax

## 2016-08-11 NOTE — Patient Instructions (Addendum)
IF you received an x-ray today, you will receive an invoice from [facility]. Please contact [facility] at [phone] with questions or concerns regarding your invoice.   IF you received labwork today, you will receive an invoice from [facility]. Please contact [facility] at [phone] with questions or concerns regarding your invoice.   Our billing staff will not be able to assist you with questions regarding bills from these companies.  You will be contacted with the lab results as soon as they are available. The fastest way to get your results is to activate your My Chart account. Instructions are located on the last page of this paperwork. If you have not heard from [place] regarding the results in 2 weeks, please contact this office.      Piriformis Syndrome Rehab Ask your health care provider which exercises are safe for you. Do exercises exactly as told by your health care provider and adjust them as directed. It is normal to feel mild stretching, pulling, tightness, or discomfort as you do these exercises, but you should stop right away if you feel sudden pain or your pain gets worse.Do not begin these exercises until told by your health care provider. Stretching and range of motion exercises These exercises warm up your muscles and joints and improve the movement and flexibility of your hip and pelvis. These exercises also help to relieve pain, numbness, and tingling. Exercise A: Hip rotators   1. Lie on your back on a firm surface. 2. Pull your left / right knee toward your same shoulder with your left / right hand until your knee is pointing toward the ceiling. Hold your left / right ankle with your other hand. 3. Keeping your knee steady, gently pull your left / right ankle toward your other shoulder until you feel a stretch in your buttocks. 4. Hold this position for __________ seconds. Repeat __________ times. Complete this stretch __________ times a  day. Exercise B: Hip extensors  1. Lie on your back on a firm surface. Both of your legs should be straight. 2. Pull your left / right knee to your chest. Hold your leg in this position by holding onto the back of your thigh or the front of your knee. 3. Hold this position for __________ seconds. 4. Slowly return to the starting position. Repeat __________ times. Complete this stretch __________ times a day. Strengthening exercises These exercises build strength and endurance in your hip and thigh muscles. Endurance is the ability to use your muscles for a long time, even after they get tired. Exercise C: Straight leg raises (  hip abductors) 1. Lie on your side with your left / right leg in the top position. Lie so your head, shoulder, knee, and hip line up. Bend your bottom knee to help you balance. 2. Lift your top leg up 4-6 inches (10-15 cm), keeping your toes pointed straight ahead. 3. Hold this position for __________ seconds. 4. Slowly lower your leg to the starting position. Let your muscles relax completely. Repeat __________ times. Complete this exercise__________ times a day. Exercise D: Hip abductors and rotators, quadruped  1. Get on your hands and knees on a firm, lightly padded surface. Your hands should be directly below your shoulders, and your knees should be directly below your hips. 2. Lift your left / right knee out to the side. Keep your knee bent. Do not twist your body. 3. Hold this position for __________ seconds. 4. Slowly lower your leg. Repeat  __________ times. Complete this exercise__________ times a day. Exercise E: Straight leg raises ( hip extensors) 1. Lie on your abdomen on a bed or a firm surface with a pillow under your hips. 2. Squeeze your buttock muscles and lift your left / right thigh off the bed. Do not let your back arch. 3. Hold this position for __________ seconds. 4. Slowly return to the starting position. Let your muscles relax completely  before doing another repetition. Repeat __________ times. Complete this exercise__________ times a day. This information is not intended to replace advice given to you by your health care provider. Make sure you discuss any questions you have with your health care provider. Document Released: 04/27/2005 Document Revised: 12/31/2015 Document Reviewed: 04/09/2015 Elsevier Interactive Patient Education  2017 [employer].

## 2016-08-12 ENCOUNTER — Ambulatory Visit: Payer: PPO

## 2016-08-12 DIAGNOSIS — M436 Torticollis: Secondary | ICD-10-CM

## 2016-08-12 DIAGNOSIS — M6281 Muscle weakness (generalized): Secondary | ICD-10-CM

## 2016-08-12 LAB — CBC WITH DIFFERENTIAL/PLATELET
BASOS: 1 %
Basophils Absolute: 0.1 10*3/uL (ref 0.0–0.2)
EOS (ABSOLUTE): 0.3 10*3/uL (ref 0.0–0.4)
EOS: 3 %
HEMATOCRIT: 41.6 % (ref 34.0–46.6)
HEMOGLOBIN: 13.9 g/dL (ref 11.1–15.9)
Immature Grans (Abs): 0.1 10*3/uL (ref 0.0–0.1)
Immature Granulocytes: 1 %
LYMPHS ABS: 1.9 10*3/uL (ref 0.7–3.1)
Lymphs: 23 %
MCH: 31.9 pg (ref 26.6–33.0)
MCHC: 33.4 g/dL (ref 31.5–35.7)
MCV: 95 fL (ref 79–97)
MONOCYTES: 6 %
Monocytes Absolute: 0.5 10*3/uL (ref 0.1–0.9)
NEUTROS ABS: 5.6 10*3/uL (ref 1.4–7.0)
Neutrophils: 66 %
Platelets: 294 10*3/uL (ref 150–379)
RBC: 4.36 x10E6/uL (ref 3.77–5.28)
RDW: 13.1 % (ref 12.3–15.4)
WBC: 8.4 10*3/uL (ref 3.4–10.8)

## 2016-08-12 LAB — COMPREHENSIVE METABOLIC PANEL
ALK PHOS: 97 IU/L (ref 39–117)
ALT: 25 IU/L (ref 0–32)
AST: 24 IU/L (ref 0–40)
Albumin/Globulin Ratio: 1.9 (ref 1.2–2.2)
Albumin: 4.7 g/dL (ref 3.6–4.8)
BUN/Creatinine Ratio: 20 (ref 12–28)
BUN: 18 mg/dL (ref 8–27)
Bilirubin Total: 0.5 mg/dL (ref 0.0–1.2)
CHLORIDE: 101 mmol/L (ref 96–106)
CO2: 22 mmol/L (ref 18–29)
Calcium: 10.1 mg/dL (ref 8.7–10.3)
Creatinine, Ser: 0.92 mg/dL (ref 0.57–1.00)
GFR calc Af Amer: 75 mL/min/{1.73_m2} (ref >59)
GFR calc non Af Amer: 65 mL/min/{1.73_m2} (ref >59)
GLOBULIN, TOTAL: 2.5 g/dL (ref 1.5–4.5)
Glucose: 101 mg/dL — ABNORMAL HIGH (ref 65–99)
POTASSIUM: 4.9 mmol/L (ref 3.5–5.2)
SODIUM: 141 mmol/L (ref 134–144)
Total Protein: 7.2 g/dL (ref 6.0–8.5)

## 2016-08-12 LAB — LIPID PANEL
CHOLESTEROL TOTAL: 233 mg/dL — AB (ref 100–199)
Chol/HDL Ratio: 4 ratio (ref 0.0–4.4)
HDL: 58 mg/dL (ref >39)
LDL Calculated: 129 mg/dL — ABNORMAL HIGH (ref 0–99)
Triglycerides: 228 mg/dL — ABNORMAL HIGH (ref 0–149)
VLDL Cholesterol Cal: 46 mg/dL — ABNORMAL HIGH (ref 5–40)

## 2016-08-12 NOTE — Therapy (Signed)
[facility] [address] [place], [address] Phone: [phone]   Fax:  [phone]  Physical Therapy Treatment  Patient Details  Name: Maria Sweeney MRN: [id number] Date of Birth: 1950/10/05 Referring Provider: Marylyn Ishihara L. Cabbell  Encounter Date: 08/12/2016      PT End of Session - 08/12/16 1540    Visit Number 10   Number of Visits 13   Date for PT Re-Evaluation 09/10/16   PT Start Time 0915   PT Stop Time 0945   PT Time Calculation (min) 30 min   Activity Tolerance Patient tolerated treatment well   Behavior During Therapy [facility] for tasks assessed/performed      Past Medical History:  Diagnosis Date  . Anxiety   . Cervical spondylosis   . Depression   . GERD (gastroesophageal reflux disease)   . Hyperlipidemia   . Hypertension   . IBS (irritable bowel syndrome)    Constipation with diarrhea.  . Oral herpes     Past Surgical History:  Procedure Laterality Date  . ABDOMINAL HYSTERECTOMY  1978   DUB; ovaries intact.  . APPENDECTOMY  1988  . BREAST BIOPSY Bilateral 2009   Calcium Deposit- 1980's and also in 2009/ Right side 1995  . COLONOSCOPY  05/11/2009   Normal.  Previous colonoscopy with polyps. [facility].    . DEBRIDEMENT TENNIS ELBOW    . ROTATOR CUFF REPAIR      There were no vitals filed for this visit.      Subjective Assessment - 08/12/16 0919    Subjective Pt reports that she is doing well on this date. She continues to note improvement in cervical motion. She reports some very minimal aching in posterior R neck but states pain is so low she can't rate on NPRS. No specific questions or concerns at this time.    Pertinent History Chronic neck pain   Limitations House hold activities   Currently in Pain? No/denies  Soreness posterior R neck, unable to rate           Objective:   Manual Therapy Suboccipital release x 30 seconds; CPA C2-C5, grade II, no spasm noted today,  2x30 seconds each segment; R C6 upglide mobilization grade III, 15s/bout x 4 bouts to improve L rotation; R upper trap stretches 30s hold x 2; STM to R cervical paraspinals; Notable improvement in L rotation following manual therapy;  Mechanical Traction Mechanical cervical traction with 25 degree pull, 20# pull x 20 seconds, 14# release x 10 seconds x 15 minutes total, no pain following;                      PT Education - 08/12/16 0921    Education provided Yes   Education Details HEP reinforced, added DNF strengthening   Person(s) Educated Patient   Methods Explanation   Comprehension Verbalized understanding             PT Long Term Goals - 07/30/16 1333      PT LONG TERM GOAL #1   Title Pt. will be able to take dogs for short walk without pain afterwards to improve general conditioning.   Baseline Unable to walk dogs   Time 6   Period Weeks   Status On-going     PT LONG TERM GOAL #2   Title Pt NDI score will decrease by 6 points indicating decreased neck disability and improved overall function   Baseline 07/30/16: 28%  Time 6   Period Weeks   Status New     PT LONG TERM GOAL #3   Title Pt will be independent in HEP to be able to safely perform exercises at home to facilitate healing   Baseline Not independent   Time 6   Period Weeks   Status On-going     PT LONG TERM GOAL #4   Title Pt will improve L cervical rotation AROM to equal R rotation in order to improve ability to look over shoulder while driving   Baseline [birth year]: L rotation 60 degrees, R rotation 70 degrees   Time 6   Period Weeks   Status New               Plan - 08/12/16 7001    Clinical Impression Statement Pt continues responding well to mechanical traction. She continues to report improvement in neck mobility and decrease in pain. She reports no further radicular symptoms down RUE. R posterior neck aching is resolved at end of session. Continue HEP and follow-up  as scheduled.    Rehab Potential Fair   Clinical Impairments Affecting Rehab Potential Positive: Motivated, age           Negatives: Chronic pain, social support   PT Frequency 2x / week   PT Duration 6 weeks   PT Treatment/Interventions Manual techniques;Energy conservation;Passive range of motion;Therapeutic exercise;Therapeutic activities;Aquatic Therapy;Dry needling;ADLs/Self Care Home Management   PT Next Visit Plan Progress cervical mobility and advance with DNF strengthening, mechanical traction if pt continues to report relief and improved mobility   PT Home Exercise Plan chin tucks, arm swings, low rows, cervical rotation stretches, DNF strengthening   Consulted and Agree with Plan of Care Patient      Patient will benefit from skilled therapeutic intervention in order to improve the following deficits and impairments:  Pain, Decreased activity tolerance, Decreased endurance, Decreased range of motion, Decreased mobility, Decreased strength, Impaired sensation, Impaired tone  Visit Diagnosis: Neck stiffness  Muscle weakness (generalized)     Problem List Patient Active Problem List   Diagnosis Date Noted  . Tinnitus 06/25/2016  . Numbness 04/13/2016  . Loss of smell 04/13/2016  . Pure hypercholesterolemia 11/30/2014  . Degenerative disc disease, cervical 04/24/2014  . Essential hypertension, benign 10/19/2013  . Insomnia 10/19/2013  . Herpes labialis 10/19/2013  . Anxiety and depression 07/25/2012  . GERD (gastroesophageal reflux disease) 07/25/2012   Phillips Grout PT, DPT   Keneshia Tena 08/12/2016, 11:23 AM  [facility] [address] [place], [address] Phone: [phone]   Fax:  [phone]  Name: Maria Sweeney MRN: [id number] Date of Birth: Jul 28, 1950

## 2016-08-14 DIAGNOSIS — H401132 Primary open-angle glaucoma, bilateral, moderate stage: Secondary | ICD-10-CM | POA: Diagnosis not present

## 2016-08-17 DIAGNOSIS — C44329 Squamous cell carcinoma of skin of other parts of face: Secondary | ICD-10-CM | POA: Diagnosis not present

## 2016-08-17 DIAGNOSIS — L570 Actinic keratosis: Secondary | ICD-10-CM | POA: Diagnosis not present

## 2016-08-17 DIAGNOSIS — X32XXXA Exposure to sunlight, initial encounter: Secondary | ICD-10-CM | POA: Diagnosis not present

## 2016-08-17 DIAGNOSIS — L821 Other seborrheic keratosis: Secondary | ICD-10-CM | POA: Diagnosis not present

## 2016-08-17 DIAGNOSIS — L814 Other melanin hyperpigmentation: Secondary | ICD-10-CM | POA: Diagnosis not present

## 2016-08-18 ENCOUNTER — Ambulatory Visit: Payer: PPO

## 2016-08-20 ENCOUNTER — Ambulatory Visit: Payer: PPO

## 2016-08-20 DIAGNOSIS — M6281 Muscle weakness (generalized): Secondary | ICD-10-CM

## 2016-08-20 DIAGNOSIS — M436 Torticollis: Secondary | ICD-10-CM

## 2016-08-20 NOTE — Therapy (Signed)
[facility] [address] [place], [address] Phone: [phone]   Fax:  [phone]  Physical Therapy Treatment  Patient Details  Name: CASANDRA DALLAIRE MRN: [id number] Date of Birth: October 25, 1950 Referring Provider: Marylyn Ishihara L. Cabbell  Encounter Date: 08/20/2016      PT End of Session - 08/20/16 0947    Visit Number 11   Number of Visits 13   Date for PT Re-Evaluation 09/10/16   PT Start Time 0948   PT Stop Time 1030   PT Time Calculation (min) 42 min   Activity Tolerance Patient tolerated treatment well   Behavior During Therapy [facility] for tasks assessed/performed      Past Medical History:  Diagnosis Date  . Anxiety   . Cervical spondylosis   . Depression   . GERD (gastroesophageal reflux disease)   . Hyperlipidemia   . Hypertension   . IBS (irritable bowel syndrome)    Constipation with diarrhea.  . Oral herpes     Past Surgical History:  Procedure Laterality Date  . ABDOMINAL HYSTERECTOMY  1978   DUB; ovaries intact.  . APPENDECTOMY  1988  . BREAST BIOPSY Bilateral 2009   Calcium Deposit- 1980's and also in 2009/ Right side 1995  . COLONOSCOPY  05/11/2009   Normal.  Previous colonoscopy with polyps. [facility].    . DEBRIDEMENT TENNIS ELBOW    . ROTATOR CUFF REPAIR      There were no vitals filed for this visit.      Subjective Assessment - 08/20/16 0946    Subjective Pt reports she is doing well on this date. She had a mild headache after last therapy session and some neck soreness however resolved the following day. Overall she reports improvement in her cervical AROM. No pain reported today. No specific questions or concerns currently.    Pertinent History Chronic neck pain   Limitations House hold activities   Currently in Pain? No/denies            TREATMENT   Manual Therapy CPA C2-C5, grade II, no spasm noted today, 2x30 seconds each segment; Bilateral upper trap stretches  30s hold x 2; PROM R and L cervical rotation; Notable improvement in L rotation following manual therapy; L rotation 70 degrees, R rotation: 80 degrees;  Mechanical Traction Mechanical cervical traction with 25 degree pull, 18# pull x 20 seconds, 12# release x 10 seconds x 10minutes total, no pain following, Initially attempted 23# and then 20# but pt unable to tolerate due to increased frontal headache pain so max/min pull lowered;  Ther-ex Supine cervical retractions x 10; DNF strengthening with chin tuck followed by head lift in supine, heavy cues to avoid superficial muscle activation, 5s hold x 5 followed immediately by 10s hold x 5 (increase HEP to 10s holds); Cervical isometrics for lateral flexion and rotation bilaterally 5s hold x 10 each; OMEGA rows 10# 2 x 10;                    PT Education - 08/20/16 0947    Education provided Yes   Education Details HEP reinforced   Person(s) Educated Patient   Methods Explanation   Comprehension Verbalized understanding             PT Long Term Goals - 07/30/16 1333      PT LONG TERM GOAL #1   Title Pt. will be able to take dogs for short walk without pain afterwards  to improve general conditioning.   Baseline Unable to walk dogs   Time 6   Period Weeks   Status On-going     PT LONG TERM GOAL #2   Title Pt NDI score will decrease by 6 points indicating decreased neck disability and improved overall function   Baseline 07/30/16: 28%   Time 6   Period Weeks   Status New     PT LONG TERM GOAL #3   Title Pt will be independent in HEP to be able to safely perform exercises at home to facilitate healing   Baseline Not independent   Time 6   Period Weeks   Status On-going     PT LONG TERM GOAL #4   Title Pt will improve L cervical rotation AROM to equal R rotation in order to improve ability to look over shoulder while driving   Baseline 8/54/62: L rotation 60 degrees, R rotation 70 degrees   Time 6    Period Weeks   Status New               Plan - 08/20/16 0947    Clinical Impression Statement Pt continues to make progress with therapy. Cervical rotation improved following mechanical traction and manual therapy. Pt encouraged to continue current HEP and increase duration of DNF exercises. Follow-up as scheduled.    Rehab Potential Fair   Clinical Impairments Affecting Rehab Potential Positive: Motivated, age           Negatives: Chronic pain, social support   PT Frequency 2x / week   PT Duration 6 weeks   PT Treatment/Interventions Manual techniques;Energy conservation;Passive range of motion;Therapeutic exercise;Therapeutic activities;Aquatic Therapy;Dry needling;ADLs/Self Care Home Management   PT Next Visit Plan Progress cervical mobility and advance with DNF strengthening, mechanical traction if pt continues to report relief and improved mobility   PT Home Exercise Plan chin tucks, arm swings, low rows, cervical rotation stretches, DNF strengthening, cervical isometrics   Consulted and Agree with Plan of Care Patient      Patient will benefit from skilled therapeutic intervention in order to improve the following deficits and impairments:  Pain, Decreased activity tolerance, Decreased endurance, Decreased range of motion, Decreased mobility, Decreased strength, Impaired sensation, Impaired tone  Visit Diagnosis: Neck stiffness  Muscle weakness (generalized)     Problem List Patient Active Problem List   Diagnosis Date Noted  . Tinnitus 06/25/2016  . Numbness 04/13/2016  . Loss of smell 04/13/2016  . Pure hypercholesterolemia 11/30/2014  . Degenerative disc disease, cervical 04/24/2014  . Essential hypertension, benign 10/19/2013  . Insomnia 10/19/2013  . Herpes labialis 10/19/2013  . Anxiety and depression 07/25/2012  . GERD (gastroesophageal reflux disease) 07/25/2012   Phillips Grout PT, DPT   Erric Machnik 08/20/2016, 1:58 PM  [place] [facility] [address], [place], [address] Phone: [phone]   Fax:  [phone]  Name: BLUE RUGGERIO MRN: [id number] Date of Birth: 1950/08/12

## 2016-08-25 ENCOUNTER — Ambulatory Visit: Payer: PPO

## 2016-08-27 ENCOUNTER — Ambulatory Visit
Admission: RE | Admit: 2016-08-27 | Discharge: 2016-08-27 | Disposition: A | Discharge status: Home / Self Care | Payer: PPO | Source: Ambulatory Visit | Attending: Family Medicine | Admitting: Family Medicine

## 2016-08-27 DIAGNOSIS — Z1231 Encounter for screening mammogram for malignant neoplasm of breast: Secondary | ICD-10-CM

## 2016-08-31 ENCOUNTER — Encounter: Payer: Self-pay | Admitting: Neurology

## 2016-08-31 ENCOUNTER — Ambulatory Visit (INDEPENDENT_AMBULATORY_CARE_PROVIDER_SITE_OTHER): Payer: PPO | Admitting: Neurology

## 2016-08-31 VITALS — BP 130/90 | HR 63 | Ht 65.0 in | Wt 136.4 lb

## 2016-08-31 DIAGNOSIS — R200 Anesthesia of skin: Secondary | ICD-10-CM

## 2016-09-01 ENCOUNTER — Ambulatory Visit: Payer: PPO

## 2016-09-01 ENCOUNTER — Other Ambulatory Visit: Payer: Self-pay | Admitting: Family Medicine

## 2016-09-01 DIAGNOSIS — M6281 Muscle weakness (generalized): Secondary | ICD-10-CM

## 2016-09-01 DIAGNOSIS — M436 Torticollis: Secondary | ICD-10-CM | POA: Diagnosis not present

## 2016-09-01 DIAGNOSIS — R928 Other abnormal and inconclusive findings on diagnostic imaging of breast: Secondary | ICD-10-CM

## 2016-09-01 NOTE — Progress Notes (Signed)
Patient scheduled appointment in error and visit was cancelled.

## 2016-09-01 NOTE — Therapy (Signed)
[facility] [address], [place], [address] Phone: [phone]   Fax:  [phone]  Physical Therapy Treatment  Patient Details  Name: Maria Sweeney MRN: [id number] Date of Birth: Sep 03, 1950 Referring Provider: Marylyn Ishihara L. Cabbell  Encounter Date: 09/01/2016      PT End of Session - 09/01/16 1424    Visit Number 12   Number of Visits 13   Date for PT Re-Evaluation 09/10/16   PT Start Time 4944   PT Stop Time 1430   PT Time Calculation (min) 45 min   Activity Tolerance Patient tolerated treatment well   Behavior During Therapy [facility] for tasks assessed/performed      Past Medical History:  Diagnosis Date  . Anxiety   . Cervical spondylosis   . Depression   . GERD (gastroesophageal reflux disease)   . Hyperlipidemia   . Hypertension   . IBS (irritable bowel syndrome)    Constipation with diarrhea.  . Oral herpes     Past Surgical History:  Procedure Laterality Date  . ABDOMINAL HYSTERECTOMY  1978   DUB; ovaries intact.  . APPENDECTOMY  1988  . BREAST BIOPSY Bilateral 2009   Calcium Deposit- 1980's and also in 2009/ Right side 1995  . BREAST EXCISIONAL BIOPSY Bilateral   . COLONOSCOPY  05/11/2009   Normal.  Previous colonoscopy with polyps. [facility].    . DEBRIDEMENT TENNIS ELBOW    . ROTATOR CUFF REPAIR      There were no vitals filed for this visit.      Subjective Assessment - 09/01/16 1349    Subjective Pt reports she is doing well on this date. She reports increased HA symptoms today. Patient reports she feels PT is helping her pain.    Pertinent History Chronic neck pain   Limitations House hold activities   Currently in Pain? Yes   Pain Score 5    Pain Location Neck   Pain Orientation Right;Left;Upper   Pain Descriptors / Indicators Aching   Pain Onset More than a month ago   Pain Frequency Intermittent        Manual Therapy STM to the cervical extensor and suboccipital  muscles with patient positioned in supine to decrease increased spasms and pain. Sub occipital release for 4 min to decrease pain and spasms  AAROM R and L cervical rotation; L rotation 80 degrees; R rotation: 80 degrees;    Ther-ex Supine cervical retractions x 10; Cervical isometrics for lateral flexion, extension bilaterally 5s hold x 5 each; OMEGA standing rows/scapular retraction -- 20# 2 x 10; Standing push downs at [place] - 15# x 20 Seated high row at [facility] - 25# x20 Push up PLUS at wall - x20; with push up with PLUS - x20  Shoulder flexion with forearms against yellow physioball - x20 Patient responds well from therapy with decreased pain and HA in the neck         PT Education - 09/01/16 1424    Education provided Yes   Education Details form/technique with exercise   Person(s) Educated Patient   Methods Explanation;Demonstration   Comprehension Verbalized understanding;Returned demonstration             PT Long Term Goals - 07/30/16 1333      PT LONG TERM GOAL #1   Title Pt. will be able to take dogs for short walk without pain afterwards to improve general conditioning.   Baseline Unable to walk dogs  Time 6   Period Weeks   Status On-going     PT LONG TERM GOAL #2   Title Pt NDI score will decrease by 6 points indicating decreased neck disability and improved overall function   Baseline 07/30/16: 28%   Time 6   Period Weeks   Status New     PT LONG TERM GOAL #3   Title Pt will be independent in HEP to be able to safely perform exercises at home to facilitate healing   Baseline Not independent   Time 6   Period Weeks   Status On-going     PT LONG TERM GOAL #4   Title Pt will improve L cervical rotation AROM to equal R rotation in order to improve ability to look over shoulder while driving   Baseline 5/70/17: L rotation 60 degrees, R rotation 70 degrees   Time 6   Period Weeks   Status New               Plan - 09/01/16 1425     Clinical Impression Statement Pt demonstrates improved pain and spasms after performing manual therapy most notably suboccipital release indicating decreased trigger points. Focused on improving thoracic and cervical scapular stabilization after decreasing pain to support improved ROM and coordination. Patient will benefit from further skilled therapy to return to prior level of function.    Rehab Potential Fair   Clinical Impairments Affecting Rehab Potential Positive: Motivated, age           Negatives: Chronic pain, social support   PT Frequency 2x / week   PT Duration 6 weeks   PT Treatment/Interventions Manual techniques;Energy conservation;Passive range of motion;Therapeutic exercise;Therapeutic activities;Aquatic Therapy;Dry needling;ADLs/Self Care Home Management   PT Next Visit Plan Progress cervical mobility and advance with DNF strengthening, mechanical traction if pt continues to report relief and improved mobility   PT Home Exercise Plan chin tucks, arm swings, low rows, cervical rotation stretches, DNF strengthening, cervical isometrics   Consulted and Agree with Plan of Care Patient      Patient will benefit from skilled therapeutic intervention in order to improve the following deficits and impairments:  Pain, Decreased activity tolerance, Decreased endurance, Decreased range of motion, Decreased mobility, Decreased strength, Impaired sensation, Impaired tone  Visit Diagnosis: Neck stiffness  Muscle weakness (generalized)     Problem List Patient Active Problem List   Diagnosis Date Noted  . Tinnitus 06/25/2016  . Numbness 04/13/2016  . Loss of smell 04/13/2016  . Pure hypercholesterolemia 11/30/2014  . Degenerative disc disease, cervical 04/24/2014  . Essential hypertension, benign 10/19/2013  . Insomnia 10/19/2013  . Herpes labialis 10/19/2013  . Anxiety and depression 07/25/2012  . GERD (gastroesophageal reflux disease) 07/25/2012    Blythe Stanford, PT  DPT 09/01/2016, 3:11 PM  [place] [facility] [place] [facility] [address] [place], [address] Phone: [phone]   Fax:  [phone]  Name: JENNEA RAGER MRN: [id number] Date of Birth: 01-25-1951

## 2016-09-03 ENCOUNTER — Ambulatory Visit: Payer: PPO

## 2016-09-04 ENCOUNTER — Ambulatory Visit
Admission: RE | Admit: 2016-09-04 | Discharge: 2016-09-04 | Disposition: A | Discharge status: Home / Self Care | Payer: PPO | Source: Ambulatory Visit | Attending: Family Medicine | Admitting: Family Medicine

## 2016-09-04 DIAGNOSIS — R922 Inconclusive mammogram: Secondary | ICD-10-CM | POA: Diagnosis not present

## 2016-09-04 DIAGNOSIS — R928 Other abnormal and inconclusive findings on diagnostic imaging of breast: Secondary | ICD-10-CM

## 2016-09-08 ENCOUNTER — Ambulatory Visit: Payer: PPO | Attending: Neurosurgery

## 2016-09-08 DIAGNOSIS — M6281 Muscle weakness (generalized): Secondary | ICD-10-CM | POA: Insufficient documentation

## 2016-09-08 DIAGNOSIS — M436 Torticollis: Secondary | ICD-10-CM | POA: Insufficient documentation

## 2016-09-08 NOTE — Therapy (Signed)
[facility] [address] [place], [address] Phone: [phone]   Fax:  [phone]  Physical Therapy Treatment  Patient Details  Name: Maria Sweeney MRN: [id number] Date of Birth: August 21, 1950 Referring Provider: Marylyn Ishihara L. Cabbell  Encounter Date: 09/08/2016      PT End of Session - 09/08/16 1406    Visit Number 13   Number of Visits 13   Date for PT Re-Evaluation 09/10/16   PT Start Time 1300   PT Stop Time 1345   PT Time Calculation (min) 45 min   Activity Tolerance Patient tolerated treatment well   Behavior During Therapy WFL for tasks assessed/performed      Past Medical History:  Diagnosis Date  . Anxiety   . Cervical spondylosis   . Depression   . GERD (gastroesophageal reflux disease)   . Hyperlipidemia   . Hypertension   . IBS (irritable bowel syndrome)    Constipation with diarrhea.  . Oral herpes     Past Surgical History:  Procedure Laterality Date  . ABDOMINAL HYSTERECTOMY  1978   DUB; ovaries intact.  . APPENDECTOMY  1988  . BREAST BIOPSY Bilateral 2009   Calcium Deposit- 1980's and also in 2009/ Right side 1995  . BREAST EXCISIONAL BIOPSY Bilateral   . COLONOSCOPY  05/11/2009   Normal.  Previous colonoscopy with polyps. [facility].    . DEBRIDEMENT TENNIS ELBOW    . ROTATOR CUFF REPAIR      There were no vitals filed for this visit.      Subjective Assessment - 09/08/16 1404    Subjective Patient reports she is doing well, pt states she has a little pain along the right aspect of her neck.    Pertinent History Chronic neck pain   Limitations House hold activities   Currently in Pain? Yes   Pain Score 3    Pain Location Neck   Pain Orientation Right   Pain Descriptors / Indicators Aching   Pain Type Chronic pain   Pain Onset More than a month ago       Manual Therapy STM to the cervical extensors and suboccipital muscles with patient positioned in supine to decrease  increased spasms and pain.  AAROM R and L cervical rotation; L rotation 80 degrees; R rotation: 80 degrees;     Ther-ex Supine cervical retractions x 10; Cervical isometrics for lateral flexion, extension bilaterally 5s hold x 10 each; AROM cervical rotation in supine - x 10 Standing serratus punches with RTB - 2 x 20  Standing B shoulder ER with RTB - 2 x 20  OMEGA standing rows/scapular retraction -- 20# 2 x 20;  Standing push downs at [employer] machine - 15# 2 x 20 TRX with performing pull ups with scapular retraction - x20 Shoulder flexion with forearms against yellow physioball - x20 Patient responds well from manual therapy with decreased pain in the neck Patient demonstrates decreased pain after performing manual therapy.        PT Education - 09/08/16 1406    Education provided Yes   Education Details form/technique with exercise   Person(s) Educated Patient   Methods Explanation;Demonstration   Comprehension Verbalized understanding;Returned demonstration             PT Long Term Goals - 07/30/16 1333      PT LONG TERM GOAL #1   Title Pt. will be able to take dogs for short walk without pain afterwards to improve  general conditioning.   Baseline Unable to walk dogs   Time 6   Period Weeks   Status On-going     PT LONG TERM GOAL #2   Title Pt NDI score will decrease by 6 points indicating decreased neck disability and improved overall function   Baseline 07/30/16: 28%   Time 6   Period Weeks   Status New     PT LONG TERM GOAL #3   Title Pt will be independent in HEP to be able to safely perform exercises at home to facilitate healing   Baseline Not independent   Time 6   Period Weeks   Status On-going     PT LONG TERM GOAL #4   Title Pt will improve L cervical rotation AROM to equal R rotation in order to improve ability to look over shoulder while driving   Baseline 5/95/63: L rotation 60 degrees, R rotation 70 degrees   Time 6   Period Weeks    Status New               Plan - 09/08/16 1407    Clinical Impression Statement Pt demonstrates improved pain and spasms after performing STM manual therapy indicating improvement in spasms and pain. Patient required less tactile cueing to perform exercises with proper form indicating significant improvement in scapular coordination and mobility. Patient will benefit from further skilled therapy to return to prior level of function.    Rehab Potential Fair   Clinical Impairments Affecting Rehab Potential Positive: Motivated, age           Negatives: Chronic pain, social support   PT Frequency 2x / week   PT Duration 6 weeks   PT Treatment/Interventions Manual techniques;Energy conservation;Passive range of motion;Therapeutic exercise;Therapeutic activities;Aquatic Therapy;Dry needling;ADLs/Self Care Home Management   PT Next Visit Plan Progress cervical mobility and advance with DNF strengthening, mechanical traction if pt continues to report relief and improved mobility   PT Home Exercise Plan chin tucks, arm swings, low rows, cervical rotation stretches, DNF strengthening, cervical isometrics   Consulted and Agree with Plan of Care Patient      Patient will benefit from skilled therapeutic intervention in order to improve the following deficits and impairments:  Pain, Decreased activity tolerance, Decreased endurance, Decreased range of motion, Decreased mobility, Decreased strength, Impaired sensation, Impaired tone, Impaired UE functional use  Visit Diagnosis: Neck stiffness  Muscle weakness (generalized)     Problem List Patient Active Problem List   Diagnosis Date Noted  . Tinnitus 06/25/2016  . Numbness 04/13/2016  . Loss of smell 04/13/2016  . Pure hypercholesterolemia 11/30/2014  . Degenerative disc disease, cervical 04/24/2014  . Essential hypertension, benign 10/19/2013  . Insomnia 10/19/2013  . Herpes labialis 10/19/2013  . Anxiety and depression 07/25/2012  .  GERD (gastroesophageal reflux disease) 07/25/2012    Blythe Stanford, PT DPT 09/08/2016, 2:14 PM  [facility] [address] [place], [address] Phone: [phone]   Fax:  [phone]  Name: Maria Sweeney MRN: [id number] Date of Birth: 1950-09-15

## 2016-09-10 ENCOUNTER — Other Ambulatory Visit: Payer: Self-pay | Admitting: Family Medicine

## 2016-09-10 DIAGNOSIS — F329 Major depressive disorder, single episode, unspecified: Secondary | ICD-10-CM

## 2016-09-10 DIAGNOSIS — F419 Anxiety disorder, unspecified: Principal | ICD-10-CM

## 2016-09-14 ENCOUNTER — Ambulatory Visit: Payer: PPO

## 2016-09-16 ENCOUNTER — Ambulatory Visit: Payer: PPO

## 2016-09-16 DIAGNOSIS — M6281 Muscle weakness (generalized): Secondary | ICD-10-CM

## 2016-09-16 DIAGNOSIS — M436 Torticollis: Secondary | ICD-10-CM

## 2016-09-16 NOTE — Therapy (Signed)
[facility] [address] [place], [address] Phone: [phone]   Fax:  [phone]  Physical Therapy Treatment  Patient Details  Name: Maria Sweeney MRN: [id number] Date of Birth: [birth year] Referring Provider: Marylyn Ishihara L. Cabbell  Encounter Date: 09/16/2016      PT End of Session - 09/16/16 1421    Visit Number 14   Number of Visits 18   Date for PT Re-Evaluation 10/01/16   PT Start Time 0737   PT Stop Time 1430   PT Time Calculation (min) 45 min   Activity Tolerance Patient tolerated treatment well   Behavior During Therapy WFL for tasks assessed/performed      Past Medical History:  Diagnosis Date  . Anxiety   . Cervical spondylosis   . Depression   . GERD (gastroesophageal reflux disease)   . Hyperlipidemia   . Hypertension   . IBS (irritable bowel syndrome)    Constipation with diarrhea.  . Oral herpes     Past Surgical History:  Procedure Laterality Date  . ABDOMINAL HYSTERECTOMY  1978   DUB; ovaries intact.  . APPENDECTOMY  1988  . BREAST BIOPSY Bilateral 2009   Calcium Deposit- 1980's and also in 2009/ Right side 1995  . BREAST EXCISIONAL BIOPSY Bilateral   . COLONOSCOPY  05/11/2009   Normal.  Previous colonoscopy with polyps. [facility].    . DEBRIDEMENT TENNIS ELBOW    . ROTATOR CUFF REPAIR      There were no vitals filed for this visit.      Subjective Assessment - 09/16/16 1416    Subjective Patient reports she is doing well on this date. Patient reports she would like to continue therapy for two weeks then be discharged from PT to work on her HEP.    Pertinent History Chronic neck pain   Limitations House hold activities   Currently in Pain? No/denies   Pain Onset More than a month ago        Manual Therapy STM to the cervical extensors and suboccipital muscles with patient positioned in supine to decrease increased spasms and pain.  AAROM R and L cervical rotation; L  rotation 80 degrees; R rotation: 80 degrees; NDI: 2%      Ther-ex Supine cervical retractions x 10; Cervical isometrics for lateral flexion, extension bilaterally 5s hold x 10 each; Shoulder flexion with forearms against red theraband - x20 OMEGA standing rows/scapular retraction -- 25# 2 x 20;              Overhead low trap ball taps - 4# 2 x 25 Ball circles on wall - with 4# ball 45sec x 2  Standing B shoulder ER with RTB -  x 20   Patient responds well from manual therapy and reports decreased tightness after performance        PT Education - 09/16/16 1419    Education provided Yes   Education Details Form/technique with exercise; Added to HEP   Person(s) Educated Patient   Methods Explanation;Demonstration   Comprehension Verbalized understanding;Returned demonstration             PT Long Term Goals - 09/16/16 1426      PT LONG TERM GOAL #1   Title Pt. will be able to take dogs for short walk without pain afterwards to improve general conditioning.   Baseline Unable to walk dogs; 09/16/16: No limitations from neck when walking dogs   Time 6   Period  Weeks   Status Achieved     PT LONG TERM GOAL #2   Title Pt NDI score will decrease by 6 points indicating decreased neck disability and improved overall function   Baseline 07/30/16: 28% 09/16/16: 2%   Time 6   Period Weeks   Status Achieved     PT LONG TERM GOAL #3   Title Pt will be independent in HEP to be able to safely perform exercises at home to facilitate healing   Baseline Not independent   Time 6   Period Weeks   Status On-going     PT LONG TERM GOAL #4   Title Pt will improve L cervical rotation AROM to equal R rotation in order to improve ability to look over shoulder while driving   Baseline 10/16/35: L rotation 60 degrees, R rotation 70 degrees 09/16/16: L &R Rotation: 80deg   Time 6   Period Weeks   Status Achieved               Plan - 09/16/16 1523    Clinical Impression Statement Pt  demonstrates improvement in cervical function and pain as demonstrated by decreased NDI score, full AROM, and improvement in functional ability such as walking her dog. Although patient is improving, she continues to require frequent cueing on exercise performance indicating decreased coordination and control.    Rehab Potential Fair   Clinical Impairments Affecting Rehab Potential Positive: Motivated, age           Negatives: Chronic pain, social support   PT Frequency 2x / week   PT Duration 6 weeks   PT Treatment/Interventions Manual techniques;Energy conservation;Passive range of motion;Therapeutic exercise;Therapeutic activities;Aquatic Therapy;Dry needling;ADLs/Self Care Home Management   PT Next Visit Plan Progress cervical mobility and advance with DNF strengthening, mechanical traction if pt continues to report relief and improved mobility   PT Home Exercise Plan chin tucks, arm swings, low rows, cervical rotation stretches, DNF strengthening, cervical isometrics   Consulted and Agree with Plan of Care Patient      Patient will benefit from skilled therapeutic intervention in order to improve the following deficits and impairments:  Pain, Decreased activity tolerance, Decreased endurance, Decreased range of motion, Decreased mobility, Decreased strength, Impaired sensation, Impaired tone, Impaired UE functional use  Visit Diagnosis: Muscle weakness (generalized)  Neck stiffness     Problem List Patient Active Problem List   Diagnosis Date Noted  . Tinnitus 06/25/2016  . Numbness 04/13/2016  . Loss of smell 04/13/2016  . Pure hypercholesterolemia 11/30/2014  . Degenerative disc disease, cervical 04/24/2014  . Essential hypertension, benign 10/19/2013  . Insomnia 10/19/2013  . Herpes labialis 10/19/2013  . Anxiety and depression 07/25/2012  . GERD (gastroesophageal reflux disease) 07/25/2012    Blythe Stanford, PT DPT 09/16/2016, 3:29 PM  [facility] [address] [place], [address] Phone: [phone]   Fax:  [phone]  Name: Maria Sweeney MRN: [id number] Date of Birth: 11/14/1950

## 2016-09-22 ENCOUNTER — Ambulatory Visit: Payer: PPO

## 2016-09-26 ENCOUNTER — Other Ambulatory Visit: Payer: Self-pay | Admitting: Family Medicine

## 2016-09-28 ENCOUNTER — Ambulatory Visit: Payer: PPO

## 2016-10-01 ENCOUNTER — Ambulatory Visit: Payer: PPO

## 2016-10-02 DIAGNOSIS — N390 Urinary tract infection, site not specified: Secondary | ICD-10-CM | POA: Diagnosis not present

## 2016-10-02 DIAGNOSIS — R300 Dysuria: Secondary | ICD-10-CM | POA: Diagnosis not present

## 2016-10-02 DIAGNOSIS — A499 Bacterial infection, unspecified: Secondary | ICD-10-CM | POA: Diagnosis not present

## 2016-10-13 ENCOUNTER — Other Ambulatory Visit: Payer: Self-pay | Admitting: Family Medicine

## 2016-12-23 ENCOUNTER — Ambulatory Visit: Payer: PPO | Admitting: Neurology

## 2016-12-30 ENCOUNTER — Ambulatory Visit: Payer: PPO | Admitting: Neurology

## 2016-12-31 ENCOUNTER — Encounter: Payer: Self-pay | Admitting: Neurology

## 2017-01-28 ENCOUNTER — Other Ambulatory Visit: Payer: Self-pay | Admitting: Neurology

## 2017-02-09 ENCOUNTER — Ambulatory Visit (INDEPENDENT_AMBULATORY_CARE_PROVIDER_SITE_OTHER): Payer: PPO | Admitting: Neurology

## 2017-02-09 ENCOUNTER — Encounter: Payer: Self-pay | Admitting: Neurology

## 2017-02-09 DIAGNOSIS — F329 Major depressive disorder, single episode, unspecified: Secondary | ICD-10-CM | POA: Diagnosis not present

## 2017-02-09 DIAGNOSIS — H9319 Tinnitus, unspecified ear: Secondary | ICD-10-CM | POA: Diagnosis not present

## 2017-02-09 DIAGNOSIS — M5030 Other cervical disc degeneration, unspecified cervical region: Secondary | ICD-10-CM

## 2017-02-09 DIAGNOSIS — F32A Depression, unspecified: Secondary | ICD-10-CM

## 2017-02-09 DIAGNOSIS — F419 Anxiety disorder, unspecified: Secondary | ICD-10-CM

## 2017-02-09 DIAGNOSIS — R200 Anesthesia of skin: Secondary | ICD-10-CM | POA: Diagnosis not present

## 2017-02-09 MED ORDER — FLUOXETINE HCL 20 MG PO CAPS: 40.0000 mg | ORAL_CAPSULE | Freq: Every day | ORAL | 3 refills | Status: DC

## 2017-02-09 MED ORDER — ARIPIPRAZOLE 5 MG PO TABS: 5.0000 mg | ORAL_TABLET | Freq: Every day | ORAL | 3 refills | Status: DC

## 2017-02-09 NOTE — Progress Notes (Signed)
Subjective:    Patient ID: Maria Sweeney, female    DOB: 10-24-1950, 66 y.o.   MRN: [id number]  02/10/2017  Hypertension (6 month follow-up); Hyperlipidemia; and Depression (with Anxiety )   HPI This 66 y.o. female presents for hypertension, hypercholesterolemia, and depression/anxiety.  No management changes at last visit.   Has gained weight; lazy and does not want to go alone.  Scared to walk alone.  There is a park with a track; wanted to go over there alone.  Would not get out of car.  Lower back pain: really hurts with walking; spasms.Interfering with exercise.  Did do exercises but persistent; no radiation into legs. No n/t/w.  No bowel or bladder dysfunction.  Not able to exercise due to ongoing lower back pain.  Can only walk so far before back really starts aching.  Not checking BP at home.   Cholesterol up last visit.  Numbness is gone now; saw Dr. Kerman Passey yesterday; follow-up in one year.  Normal sensation at last visit.  Stress reaction felt to be etiology.  No change in medications.   BP Readings from Last 3 Encounters:  02/10/17 136/70  08/31/16 130/90  08/11/16 136/79   Wt Readings from Last 3 Encounters:  02/10/17 142 lb (64.4 kg)  08/31/16 136 lb 6 oz (61.9 kg)  08/11/16 137 lb (62.1 kg)   Immunization History  Administered Date(s) Administered  . Influenza,inj,Quad PF,6+ Mos 02/22/2015, 02/11/2016, 02/10/2017  . Pneumococcal Conjugate-13 04/26/2015  . Pneumococcal Polysaccharide-23 05/12/2016    Review of Systems  Constitutional: Negative for chills, diaphoresis, fatigue and fever.  Eyes: Negative for visual disturbance.  Respiratory: Negative for cough and shortness of breath.   Cardiovascular: Negative for chest pain, palpitations and leg swelling.  Gastrointestinal: Negative for abdominal pain, constipation, diarrhea, nausea and vomiting.  Endocrine: Negative for cold intolerance, heat intolerance, polydipsia, polyphagia and polyuria.    Musculoskeletal: Positive for back pain.  Neurological: Negative for dizziness, tremors, seizures, syncope, facial asymmetry, speech difficulty, weakness, light-headedness, numbness and headaches.  Psychiatric/Behavioral: Negative for dysphoric mood, self-injury and sleep disturbance. The patient is nervous/anxious.     Past Medical History:  Diagnosis Date  . Anxiety   . Cervical spondylosis   . Depression   . GERD (gastroesophageal reflux disease)   . Hyperlipidemia   . Hypertension   . IBS (irritable bowel syndrome)    Constipation with diarrhea.  . Oral herpes    Past Surgical History:  Procedure Laterality Date  . ABDOMINAL HYSTERECTOMY  1978   DUB; ovaries intact.  . APPENDECTOMY  1988  . BREAST BIOPSY Bilateral 2009   Calcium Deposit- 1980's and also in 2009/ Right side 1995  . BREAST EXCISIONAL BIOPSY Bilateral   . COLONOSCOPY  05/11/2009   Normal.  Previous colonoscopy with polyps. [facility].    . DEBRIDEMENT TENNIS ELBOW    . ROTATOR CUFF REPAIR     Allergies  Allergen Reactions  . Amoxicillin   . Augmentin [Amoxicillin-Pot Clavulanate]   . Codeine   . Depakote [Divalproex Sodium]   . Latex   . Lipitor [Atorvastatin]   . Penicillins   . Septra [Sulfamethoxazole-Trimethoprim]   . Zocor [Simvastatin]     Social History   Social History  . Marital status: Divorced    Spouse name: N/A  . Number of children: 2  . Years of education: Bachelors   Occupational History  . 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   Social History Main  Topics  . Smoking status: Never Smoker  . Smokeless tobacco: Never Used  . Alcohol use 0.6 oz/week    1 Glasses of wine per week     Comment: rare alcohol use; once per year on average.  . Drug use: No  . Sexual activity: Yes    Partners: Male    Birth control/ protection: Surgical   Other Topics Concern  . Not on file   Social History Narrative   Marital status: divorced since 1999; dating seriously in 2018  since 2015.      Children: one son, one daughter; 2 grandchildren.      Lives: alone in house with dogs     Employment:  Retired.      Tobacco: none      Alcohol: rare      Drugs: none      Exercise:  Sporadic   Right-handed.   Occasional caffeine use.      ADLs: independent with ADLs; drives; no assistant devices.      Advanced Directives: none; FULL CODE.   Family History  Problem Relation Age of Onset  . Hyperlipidemia Mother   . Hypertension Mother   . Cancer Mother        lung  . Diabetes Mother   . Glaucoma Mother   . Stroke Mother 74  . Heart disease Mother   . Asthma Father   . Emphysema Father   . COPD Father   . Glaucoma Father   . Hypertension Sister   . Breast cancer Neg Hx        Objective:    BP 136/70   Pulse 69   Temp 98 F (36.7 C) (Oral)   Resp 16   Ht 5' 1.42" (1.56 m)   Wt 142 lb (64.4 kg)   SpO2 97%   BMI 26.47 kg/m  Physical Exam  Constitutional: She is oriented to person, place, and time. She appears well-developed and well-nourished. No distress.  HENT:  Head: Normocephalic and atraumatic.  Right Ear: External ear normal.  Left Ear: External ear normal.  Nose: Nose normal.  Mouth/Throat: Oropharynx is clear and moist.  Eyes: Pupils are equal, round, and reactive to light. Conjunctivae and EOM are normal.  Neck: Normal range of motion. Neck supple. Carotid bruit is not present. No thyromegaly present.  Cardiovascular: Normal rate, regular rhythm, normal heart sounds and intact distal pulses.  Exam reveals no gallop and no friction rub.   No murmur heard. Pulmonary/Chest: Effort normal and breath sounds normal. She has no wheezes. She has no rales.  Abdominal: Soft. Bowel sounds are normal. She exhibits no distension and no mass. There is no tenderness. There is no rebound and no guarding.  Musculoskeletal:       Lumbar back: She exhibits pain. She exhibits normal range of motion, no tenderness, no bony tenderness, no spasm and normal  pulse.  Lymphadenopathy:    She has no cervical adenopathy.  Neurological: She is alert and oriented to person, place, and time. No cranial nerve deficit.  Skin: Skin is warm and dry. No rash noted. She is not diaphoretic. No erythema. No pallor.  Psychiatric: She has a normal mood and affect. Her behavior is normal.    No results found. Depression screen [facility] 2/9 02/10/2017 08/11/2016 05/12/2016 04/01/2016 03/21/2016  Decreased Interest 0 0 0 1 0  Down, Depressed, Hopeless 0 0 0 3 0  PHQ - 2 Score 0 0 0 4 0  Altered sleeping - - -  3 -  Tired, decreased energy - - - 3 -  Change in appetite - - - 0 -  Feeling bad or failure about yourself  - - - 2 -  Trouble concentrating - - - 3 -  Moving slowly or fidgety/restless - - - 3 -  Suicidal thoughts - - - 0 -  PHQ-9 Score - - - 18 -   Fall Risk  02/10/2017 02/10/2017 08/31/2016 08/11/2016 05/12/2016  Falls in the past year? Yes No No No No  Number falls in past yr: 1 - - - -  Comment - - - - -  Injury with Fall? No - - - -  Comment - - - - -        Assessment & Plan:   1. Essential hypertension, benign   2. Gastroesophageal reflux disease without esophagitis   3. Anxiety and depression   4. Insomnia due to other mental disorder   5. Pure hypercholesterolemia   6. Degenerative disc disease, cervical   7. Need for prophylactic vaccination and inoculation against influenza   8. Acute bilateral low back pain without sciatica   9. Need for shingles vaccine    -persistent lower back pain; obtain LS spine films; refer to physical therapy as pain is interfering with ability to exercise and no improvement with home exercise program. -blood pressure well controlled; no change in management. -cholesterol not at goal; repeat labs today.   -anxiety and depression stable off of benzos; doing well; no change in Fluoxetine and Abilify.  Highly recommend exercise for anxiety/depression/stress management.  Encourage joining Chief of [employer] through [employer]. -weight gain ongoing; -recommend weight loss, exercise for 30-60 minutes five days per week; recommend 1200 kcal restriction per day with a minimum of 60 grams of protein per day.  Must exercise daily. -obtain labs for chronic disease management.    Orders Placed This Encounter  Procedures  . DG Lumbar Spine Complete    Standing Status:   Future    Number of Occurrences:   1    Standing Expiration Date:   02/10/2018    Order Specific Question:   Reason for Exam (SYMPTOM  OR DIAGNOSIS REQUIRED)    Answer:   lower back pain B; no radiation    Order Specific Question:   Preferred imaging location?    Answer:   External  . Flu Vaccine QUAD 36+ mos IM  . CBC  . Comprehensive metabolic panel    Order Specific Question:   Has the patient fasted?    Answer:   No  . Lipid panel    Order Specific Question:   Has the patient fasted?    Answer:   No  . CBC with Differential/Platelet  . Lipid panel    Order Specific Question:   Has the patient fasted?    Answer:   No  . Comprehensive metabolic panel    Order Specific Question:   Has the patient fasted?    Answer:   No  . Ambulatory referral to Physical Therapy    Referral Priority:   Routine    Referral Type:   Physical Medicine    Referral Reason:   Specialty Services Required    Requested Specialty:   Physical Therapy    Number of Visits Requested:   1   Meds ordered this encounter  Medications  . Zoster Vac Recomb Adjuvanted [facility]) injection    Sig: Inject 0.5 mLs into the muscle once.  Dispense:  0.5 mL    Refill:  1  . pantoprazole (PROTONIX) 40 MG tablet    Sig: Take 1 tablet (40 mg total) by mouth daily.    Dispense:  90 tablet    Refill:  3  . pravastatin (PRAVACHOL) 40 MG tablet    Sig: Take 1 tablet (40 mg total) by mouth daily.    Dispense:  90 tablet    Refill:  1  . ranitidine (ZANTAC) 150 MG tablet    Sig: Take 1 tablet (150 mg total) by mouth at bedtime.    Dispense:  90 tablet    Refill:   1  . spironolactone (ALDACTONE) 50 MG tablet    Sig: Take 1 tablet (50 mg total) by mouth daily.    Dispense:  90 tablet    Refill:  1    Return in about 4 months (around 06/13/2017) for complete physical examiniation.   Nuchem Grattan Elayne Guerin, M.D. Primary Care at [facility] previously [facility] [place] [address] [place], [place]  [address] [phone] phone [phone] fax

## 2017-02-09 NOTE — Progress Notes (Signed)
[facility]  PATIENT: Maria Sweeney DOB: 02-Sep-1950  REFERRING DOCTOR OR PCP:  Reginia Forts SOURCE: patient, records, MRI images and report  _________________________________   HISTORICAL  CHIEF COMPLAINT:  Chief Complaint  Patient presents with  . Numbness    Sts. left sided numbness has completely resolved, and tinnitus is still present, but not as loud as it used to be. Agitation/mood is better since adding Abilify./fim  . Tinnitus    HISTORY OF PRESENT ILLNESS:  Maria Sweeney Is a 66 year old woman with left-sided numbness me anxiety and tinnitus and blurry vision.  Update 02/09/2017:      The addition of Abilify has helped her anxiety and agitation.   She tolerates it well.   She continues on Prozac.    She feels that the left sided numbness has almost completely resolved and she just get intermittent numbness in the left arm but none of the face. Of note, she has had carpal tunnel surgery on the right and believes some of her current symptoms in the left arm or similar to the CTS symptoms she experienced in the past. She denies any weakness in the left hand. She notes mild neck pain at time but this is better than it was in the past. She still notes some tinnitus though this has improved. It is no longer troublesome to her.   ____________________________ From 06/25/2016  She continues to have numbness in the left face, arm and leg. Numbness does not increase or decrease with position or other activities.  At times, she notes more numbness than other times. She feels it is better than it was at the last visit. Symptoms started after she tapered herself off of clonazepam after being on it for 22 years.     She continues to have reduced taste and smell.   She passed a smell test when she saw ENT.  Symptoms started after she stopped the clonazepam.     Tinnitus is better but she still notes it when surroundings are quiet.  She still has some anxiety. She  would like to remain off of benzodiazepines if possible.  She was already on Prozac 40 mg when she tapered the clonazepam.  BuSpar was added at the last visit but had some tolerability issues with it and stopped. She has never been on Abilify.   The MRI of the brain 04/24/2016 shows T2/FLAIR hyperintense foci in the pons in the hemispheres. This is likely due to chronic microvascular ischemic change the MRI of the cervical spine shows spinal stenosis at C5-C6 and degenerative changes at C3-C4, C4-C5 and C6-C7 there is multilevel foraminal narrowing. There is no spinal cord compression.   REVIEW OF SYSTEMS: Constitutional: No fevers, chills, sweats, or change in appetite Eyes: No visual changes, double vision, eye pain Ear, nose and throat: She noted some ringing in her ears. She has had loss of sense of smell and taste. Cardiovascular: No chest pain, palpitations Respiratory: No shortness of breath at rest or with exertion.   No wheezes GastrointestinaI: No nausea, vomiting, diarrhea, abdominal pain, fecal incontinence Genitourinary: No dysuria, urinary retention or frequency.  No nocturia. Musculoskeletal: No neck pain, back pain Integumentary: No rash, pruritus, skin lesions Neurological: as above Psychiatric: No depression at this time.  Notes anxiety Endocrine: No palpitations, diaphoresis, change in appetite, change in weigh or increased thirst Hematologic/Lymphatic: No anemia, purpura, petechiae. Allergic/Immunologic: No itchy/runny eyes, nasal congestion, recent allergic reactions, rashes  ALLERGIES: Allergies  Allergen Reactions  . Amoxicillin   .  Augmentin [Amoxicillin-Pot Clavulanate]   . Codeine   . Depakote [Divalproex Sodium]   . Latex   . Lipitor [Atorvastatin]   . Penicillins   . Septra [Sulfamethoxazole-Trimethoprim]   . Zocor [Simvastatin]     HOME MEDICATIONS:  Current Outpatient Prescriptions:  .  albuterol (PROVENTIL HFA;VENTOLIN HFA) 108 (90 Base) MCG/ACT  inhaler, Inhale 2 puffs into the lungs every 4 (four) hours as needed for wheezing or shortness of breath (cough, shortness of breath or wheezing.)., Disp: 1 Inhaler, Rfl: 1 .  ARIPiprazole (ABILIFY) 5 MG tablet, Take 1 tablet (5 mg total) by mouth daily., Disp: 90 tablet, Rfl: 3 .  FLUoxetine (PROZAC) 20 MG capsule, Take 2 capsules (40 mg total) by mouth daily., Disp: 180 capsule, Rfl: 3 .  methocarbamol (ROBAXIN) 500 MG tablet, TAKE 1 OR 2 TABLETS AT BEDTIME, Disp: 60 tablet, Rfl: 1 .  metoprolol succinate (TOPROL-XL) 25 MG 24 hr tablet, TAKE 1 TABLET BY MOUTH ONCE A DAY, Disp: 30 tablet, Rfl: 11 .  pantoprazole (PROTONIX) 40 MG tablet, Take 1 tablet (40 mg total) by mouth daily., Disp: 30 tablet, Rfl: 11 .  pravastatin (PRAVACHOL) 40 MG tablet, TAKE 1 TABLET BY MOUTH ONCE A DAY, Disp: 90 tablet, Rfl: 1 .  ranitidine (ZANTAC) 150 MG tablet, TAKE 1 TABLET BY MOUTH AT BEDTIME, Disp: 90 tablet, Rfl: 1 .  spironolactone (ALDACTONE) 50 MG tablet, TAKE 1 TABLET BY MOUTH ONCE A DAY, Disp: 30 tablet, Rfl: 5 .  valACYclovir (VALTREX) 1000 MG tablet, Take 1 tablet (1,000 mg total) by mouth 2 (two) times daily., Disp: 30 tablet, Rfl: 1  PAST MEDICAL HISTORY: Past Medical History:  Diagnosis Date  . Anxiety   . Cervical spondylosis   . Depression   . GERD (gastroesophageal reflux disease)   . Hyperlipidemia   . Hypertension   . IBS (irritable bowel syndrome)    Constipation with diarrhea.  . Oral herpes     PAST SURGICAL HISTORY: Past Surgical History:  Procedure Laterality Date  . ABDOMINAL HYSTERECTOMY  1978   DUB; ovaries intact.  . APPENDECTOMY  1988  . BREAST BIOPSY Bilateral 2009   Calcium Deposit- 1980's and also in 2009/ Right side 1995  . BREAST EXCISIONAL BIOPSY Bilateral   . COLONOSCOPY  05/11/2009   Normal.  Previous colonoscopy with polyps. [facility].    . DEBRIDEMENT TENNIS ELBOW    . ROTATOR CUFF REPAIR      FAMILY HISTORY: Family History  Problem Relation Age of Onset  .  Hyperlipidemia Mother   . Hypertension Mother   . Cancer Mother        lung  . Diabetes Mother   . Glaucoma Mother   . Stroke Mother 24  . Heart disease Mother   . Asthma Father   . Emphysema Father   . COPD Father   . Glaucoma Father   . Hypertension Sister   . Breast cancer Neg Hx     SOCIAL HISTORY:  Social History   Social History  . Marital status: Divorced    Spouse name: N/A  . Number of children: 2  . Years of education: Bachelors   Occupational History  . 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   Social History Main Topics  . Smoking status: Never Smoker  . Smokeless tobacco: Never Used  . Alcohol use 0.6 oz/week    1 Glasses of wine per week     Comment: rare alcohol use; once per year  on average.  . Drug use: No  . Sexual activity: Yes    Partners: Male    Birth control/ protection: Surgical   Other Topics Concern  . Not on file   Social History Narrative   Marital status: divorced since 1999; dating seriously in 2018 since 2015.      Children: one son, one daughter; 2 grandchildren.      Lives: alone in house with dogs     Employment:  Retired.      Tobacco: none      Alcohol: rare      Drugs: none      Exercise:  Sporadic   Right-handed.   Occasional caffeine use.      ADLs: independent with ADLs; drives; no assistant devices.      Advanced Directives: none; FULL CODE.     PHYSICAL EXAM  There were no vitals filed for this visit.  There is no height or weight on file to calculate BMI.   General: The patient is well-developed and well-nourished and in no acute distress   Neurologic Exam  Mental status: The patient is alert and oriented x 3 at the time of the examination. The patient has apparent normal recent and remote memory, with an apparently normal attention span and concentration ability.   Speech is normal.  Cranial nerves: Extraocular movements are full.  Facial symmetry is present. There is good mild reduced  sensation to soft touch on the left.Facial strength is normal.  Trapezius and sternocleidomastoid strength is normal. No dysarthria is noted.  The tongue is midline, and the patient has symmetric elevation of the soft palate. Hearing was slightly reduced on hr left and hte Weber lateralized to the right slightly   Motor:  Muscle bulk is normal.   Tone is normal. Strength is  5 / 5 in all 4 extremities except 4+/5 left APB  Sensory:   On examination today, she had symmetric sensation to touch and temperature and vibration in the arms and legs , Coordination: Cerebellar testing reveals good finger-nose-finger and heel-to-shin bilaterally.  Gait and station: Station is normal.   Gait is normal. Tandem gait is mildly wide. Romberg is negative.   Reflexes: Deep tendon reflexes are symmetric and normal bilaterally.   Plantar responses are flexor.  Other:   She has a Tinel's sign a the left wrist.        DIAGNOSTIC DATA (LABS, IMAGING, TESTING) - I reviewed patient records, labs, notes, testing and imaging myself where available.  Lab Results  Component Value Date   WBC 8.4 08/11/2016   HGB 13.9 08/11/2016   HCT 41.6 08/11/2016   MCV 95 08/11/2016   PLT 294 08/11/2016      Component Value Date/Time   NA 141 08/11/2016 1231   K 4.9 08/11/2016 1231   CL 101 08/11/2016 1231   CO2 22 08/11/2016 1231   GLUCOSE 101 (H) 08/11/2016 1231   GLUCOSE 103 (H) 03/27/2016 1110   BUN 18 08/11/2016 1231   CREATININE 0.92 08/11/2016 1231   CREATININE 0.89 03/27/2016 1110   CALCIUM 10.1 08/11/2016 1231   PROT 7.2 08/11/2016 1231   ALBUMIN 4.7 08/11/2016 1231   AST 24 08/11/2016 1231   ALT 25 08/11/2016 1231   ALKPHOS 97 08/11/2016 1231   BILITOT 0.5 08/11/2016 1231   GFRNONAA 65 08/11/2016 1231   GFRNONAA 86 10/18/2013 1528   GFRAA 75 08/11/2016 1231   GFRAA >89 10/18/2013 1528   Lab Results  Component Value Date  CHOL 233 (H) 08/11/2016   HDL 58 08/11/2016   LDLCALC 129 (H) 08/11/2016     TRIG 228 (H) 08/11/2016   CHOLHDL 4.0 08/11/2016   Lab Results  Component Value Date   HGBA1C 5.6 02/11/2016   No results found for: VITAMINB12 Lab Results  Component Value Date   TSH 3.506 03/19/2016       ASSESSMENT AND PLAN  Numbness  Anxiety and depression - Plan: FLUoxetine (PROZAC) 20 MG capsule  Tinnitus, unspecified laterality  Degenerative disc disease, cervical  1. Continue Prozac 40 mg and Abilify 5 mg daily.  2.   If the carpal tunnel symptoms worsen in the left hand we will check a nerve conduction study/electromyogram.  3.    Continue to exercise and remain active. 4.    Return in 12 months or sooner if there are new or worsening neurologic symptoms.   We discussed that if her primary care physician is willing to write the fluoxetine and Abilify that she can make any follow-up visits as needed rather than scheduled  Dorette Hartel A. Felecia Shelling, MD, PhD 69/10/2950, 84:13 PM Certified in Neurology, Clinical Neurophysiology, Sleep Medicine, Pain Medicine and Neuroimaging  [facility] [address] [place], [place] [address] [phone]

## 2017-02-10 ENCOUNTER — Encounter: Payer: Self-pay | Admitting: Family Medicine

## 2017-02-10 ENCOUNTER — Ambulatory Visit (INDEPENDENT_AMBULATORY_CARE_PROVIDER_SITE_OTHER): Payer: PPO | Admitting: Family Medicine

## 2017-02-10 ENCOUNTER — Ambulatory Visit (INDEPENDENT_AMBULATORY_CARE_PROVIDER_SITE_OTHER): Payer: PPO

## 2017-02-10 VITALS — BP 136/70 | HR 69 | Temp 98.0°F | Resp 16 | Ht 61.42 in | Wt 142.0 lb

## 2017-02-10 DIAGNOSIS — I10 Essential (primary) hypertension: Secondary | ICD-10-CM | POA: Diagnosis not present

## 2017-02-10 DIAGNOSIS — F5105 Insomnia due to other mental disorder: Secondary | ICD-10-CM

## 2017-02-10 DIAGNOSIS — E7800 Pure hypercholesterolemia, unspecified: Secondary | ICD-10-CM

## 2017-02-10 DIAGNOSIS — F99 Mental disorder, not otherwise specified: Secondary | ICD-10-CM

## 2017-02-10 DIAGNOSIS — Z23 Encounter for immunization: Secondary | ICD-10-CM

## 2017-02-10 DIAGNOSIS — M5030 Other cervical disc degeneration, unspecified cervical region: Secondary | ICD-10-CM

## 2017-02-10 DIAGNOSIS — F329 Major depressive disorder, single episode, unspecified: Secondary | ICD-10-CM

## 2017-02-10 DIAGNOSIS — F32A Depression, unspecified: Secondary | ICD-10-CM

## 2017-02-10 DIAGNOSIS — M545 Low back pain, unspecified: Secondary | ICD-10-CM

## 2017-02-10 DIAGNOSIS — K219 Gastro-esophageal reflux disease without esophagitis: Secondary | ICD-10-CM | POA: Diagnosis not present

## 2017-02-10 DIAGNOSIS — F419 Anxiety disorder, unspecified: Secondary | ICD-10-CM | POA: Diagnosis not present

## 2017-02-10 LAB — COMPREHENSIVE METABOLIC PANEL
A/G RATIO: 1.8 (ref 1.2–2.2)
ALT: 26 IU/L (ref 0–32)
AST: 20 IU/L (ref 0–40)
Albumin: 4.7 g/dL (ref 3.6–4.8)
Alkaline Phosphatase: 100 IU/L (ref 39–117)
BILIRUBIN TOTAL: 0.5 mg/dL (ref 0.0–1.2)
BUN/Creatinine Ratio: 15 (ref 12–28)
BUN: 14 mg/dL (ref 8–27)
CO2: 23 mmol/L (ref 20–29)
CREATININE: 0.92 mg/dL (ref 0.57–1.00)
Calcium: 10.4 mg/dL — ABNORMAL HIGH (ref 8.7–10.3)
Chloride: 104 mmol/L (ref 96–106)
GFR calc Af Amer: 75 mL/min/{1.73_m2} (ref >59)
GFR calc non Af Amer: 65 mL/min/{1.73_m2} (ref >59)
Globulin, Total: 2.6 g/dL (ref 1.5–4.5)
Glucose: 105 mg/dL — ABNORMAL HIGH (ref 65–99)
Potassium: 5.2 mmol/L (ref 3.5–5.2)
Sodium: 143 mmol/L (ref 134–144)
TOTAL PROTEIN: 7.3 g/dL (ref 6.0–8.5)

## 2017-02-10 LAB — CBC WITH DIFFERENTIAL/PLATELET
Basophils Absolute: 0 10*3/uL (ref 0.0–0.2)
Basos: 1 %
EOS (ABSOLUTE): 0.2 10*3/uL (ref 0.0–0.4)
Eos: 3 %
HEMOGLOBIN: 13.6 g/dL (ref 11.1–15.9)
Hematocrit: 40.8 % (ref 34.0–46.6)
IMMATURE GRANULOCYTES: 1 %
Immature Grans (Abs): 0.1 10*3/uL (ref 0.0–0.1)
LYMPHS: 23 %
Lymphocytes Absolute: 1.8 10*3/uL (ref 0.7–3.1)
MCH: 31.5 pg (ref 26.6–33.0)
MCHC: 33.3 g/dL (ref 31.5–35.7)
MCV: 94 fL (ref 79–97)
MONOCYTES: 7 %
MONOS ABS: 0.5 10*3/uL (ref 0.1–0.9)
NEUTROS PCT: 65 %
Neutrophils Absolute: 5.1 10*3/uL (ref 1.4–7.0)
Platelets: 289 10*3/uL (ref 150–379)
RBC: 4.32 x10E6/uL (ref 3.77–5.28)
RDW: 13.6 % (ref 12.3–15.4)
WBC: 7.6 10*3/uL (ref 3.4–10.8)

## 2017-02-10 LAB — LIPID PANEL
Chol/HDL Ratio: 3.5 ratio (ref 0.0–4.4)
Cholesterol, Total: 215 mg/dL — ABNORMAL HIGH (ref 100–199)
HDL: 62 mg/dL (ref >39)
LDL Calculated: 99 mg/dL (ref 0–99)
TRIGLYCERIDES: 270 mg/dL — AB (ref 0–149)
VLDL Cholesterol Cal: 54 mg/dL — ABNORMAL HIGH (ref 5–40)

## 2017-02-10 MED ORDER — ZOSTER VAC RECOMB ADJUVANTED 50 MCG/0.5ML IM SUSR: 0.5000 mL | Freq: Once | INTRAMUSCULAR | 1 refills | Status: AC

## 2017-02-10 NOTE — Patient Instructions (Addendum)
Call [employer] regarding [employer].   Low Back Sprain Rehab Ask your health care provider which exercises are safe for you. Do exercises exactly as told by your health care provider and adjust them as directed. It is normal to feel mild stretching, pulling, tightness, or discomfort as you do these exercises, but you should stop right away if you feel sudden pain or your pain gets worse. Do not begin these exercises until told by your health care provider. Stretching and range of motion exercises These exercises warm up your muscles and joints and improve the movement and flexibility of your back. These exercises also help to relieve pain, numbness, and tingling. Exercise A: Lumbar rotation  1. Lie on your back on a firm surface and bend your knees. 2. Straighten your arms out to your sides so each arm forms an "L" shape with a side of your body (a 90 degree angle). 3. Slowly move both of your knees to one side of your body until you feel a stretch in your lower back. Try not to let your shoulders move off of the floor. 4. Hold for __________ seconds. 5. Tense your abdominal muscles and slowly move your knees back to the starting position. 6. Repeat this exercise on the other side of your body. Repeat __________ times. Complete this exercise __________ times a day. Exercise B: Prone extension on elbows  1. Lie on your abdomen on a firm surface. 2. Prop yourself up on your elbows. 3. Use your arms to help lift your chest up until you feel a gentle stretch in your abdomen and your lower back. ? This will place some of your body weight on your elbows. If this is uncomfortable, try stacking pillows under your chest. ? Your hips should stay down, against the surface that you are lying on. Keep your hip and back muscles relaxed. 4. Hold for __________ seconds. 5. Slowly relax your upper body and return to the starting position. Repeat __________ times. Complete this  exercise __________ times a day. Strengthening exercises These exercises build strength and endurance in your back. Endurance is the ability to use your muscles for a long time, even after they get tired. Exercise C: Pelvic tilt 1. Lie on your back on a firm surface. Bend your knees and keep your feet flat. 2. Tense your abdominal muscles. Tip your pelvis up toward the ceiling and flatten your lower back into the floor. ? To help with this exercise, you may place a small towel under your lower back and try to push your back into the towel. 3. Hold for __________ seconds. 4. Let your muscles relax completely before you repeat this exercise. Repeat __________ times. Complete this exercise __________ times a day. Exercise D: Alternating arm and leg raises  1. Get on your hands and knees on a firm surface. If you are on a hard floor, you may want to use padding to cushion your knees, such as an exercise mat. 2. Line up your arms and legs. Your hands should be below your shoulders, and your knees should be below your hips. 3. Lift your left leg behind you. At the same time, raise your right arm and straighten it in front of you. ? Do not lift your leg higher than your hip. ? Do not lift your arm higher than your shoulder. ? Keep your abdominal and back muscles tight. ? Keep your hips facing the ground. ? Do not arch your back. ? Keep your balance  carefully, and do not hold your breath. 4. Hold for __________ seconds. 5. Slowly return to the starting position and repeat with your right leg and your left arm. Repeat __________ times. Complete this exercise __________ times a day. Exercise E: Abdominal set with straight leg raise  1. Lie on your back on a firm surface. 2. Bend one of your knees and keep your other leg straight. 3. Tense your abdominal muscles and lift your straight leg up, 4-6 inches (10-15 cm) off the ground. 4. Keep your abdominal muscles tight and hold for __________  seconds. ? Do not hold your breath. ? Do not arch your back. Keep it flat against the ground. 5. Keep your abdominal muscles tense as you slowly lower your leg back to the starting position. 6. Repeat with your other leg. Repeat __________ times. Complete this exercise __________ times a day. Posture and body mechanics  Body mechanics refers to the movements and positions of your body while you do your daily activities. Posture is part of body mechanics. Good posture and healthy body mechanics can help to relieve stress in your body's tissues and joints. Good posture means that your spine is in its natural S-curve position (your spine is neutral), your shoulders are pulled back slightly, and your head is not tipped forward. The following are general guidelines for applying improved posture and body mechanics to your everyday activities. Standing   When standing, keep your spine neutral and your feet about hip-width apart. Keep a slight bend in your knees. Your ears, shoulders, and hips should line up.  When you do a task in which you stand in one place for a long time, place one foot up on a stable object that is 2-4 inches (5-10 cm) high, such as a footstool. This helps keep your spine neutral. Sitting   When sitting, keep your spine neutral and keep your feet flat on the floor. Use a footrest, if necessary, and keep your thighs parallel to the floor. Avoid rounding your shoulders, and avoid tilting your head forward.  When working at a desk or a computer, keep your desk at a height where your hands are slightly lower than your elbows. Slide your chair under your desk so you are close enough to maintain good posture.  When working at a computer, place your monitor at a height where you are looking straight ahead and you do not have to tilt your head forward or downward to look at the screen. Resting   When lying down and resting, avoid positions that are most painful for you.  If you  have pain with activities such as sitting, bending, stooping, or squatting (flexion-based activities), lie in a position in which your body does not bend very much. For example, avoid curling up on your side with your arms and knees near your chest (fetal position).  If you have pain with activities such as standing for a long time or reaching with your arms (extension-based activities), lie with your spine in a neutral position and bend your knees slightly. Try the following positions:  Lying on your side with a pillow between your knees.  Lying on your back with a pillow under your knees. Lifting   When lifting objects, keep your feet at least shoulder-width apart and tighten your abdominal muscles.  Bend your knees and hips and keep your spine neutral. It is important to lift using the strength of your legs, not your back. Do not lock your knees straight out.  Always ask for help to lift heavy or awkward objects. This information is not intended to replace advice given to you by your health care provider. Make sure you discuss any questions you have with your health care provider. Document Released: 04/27/2005 Document Revised: 01/02/2016 Document Reviewed: 02/06/2015 Elsevier Interactive Patient Education  2018 [employer].    IF you received an x-ray today, you will receive an invoice from [facility]. Please contact [facility] at [phone] with questions or concerns regarding your invoice.   IF you received labwork today, you will receive an invoice from [facility]. Please contact [facility] at [phone] with questions or concerns regarding your invoice.   Our billing staff will not be able to assist you with questions regarding bills from these companies.  You will be contacted with the lab results as soon as they are available. The fastest way to get your results is to activate your My Chart account. Instructions are located on the last page of this  paperwork. If you have not heard from [place] regarding the results in 2 weeks, please contact this office.    \

## 2017-02-16 MED ORDER — SPIRONOLACTONE 50 MG PO TABS: 50.0000 mg | ORAL_TABLET | Freq: Every day | ORAL | 1 refills | Status: DC

## 2017-02-16 MED ORDER — PANTOPRAZOLE SODIUM 40 MG PO TBEC: 40.0000 mg | DELAYED_RELEASE_TABLET | Freq: Every day | ORAL | 3 refills | Status: DC

## 2017-02-16 MED ORDER — RANITIDINE HCL 150 MG PO TABS: 150.0000 mg | ORAL_TABLET | Freq: Every day | ORAL | 1 refills | Status: DC

## 2017-02-16 MED ORDER — PRAVASTATIN SODIUM 40 MG PO TABS: 40.0000 mg | ORAL_TABLET | Freq: Every day | ORAL | 1 refills | Status: DC

## 2017-02-18 ENCOUNTER — Other Ambulatory Visit: Payer: Self-pay | Admitting: Family Medicine

## 2017-05-06 ENCOUNTER — Telehealth: Payer: Self-pay

## 2017-05-06 DIAGNOSIS — B029 Zoster without complications: Secondary | ICD-10-CM | POA: Diagnosis not present

## 2017-05-06 NOTE — Telephone Encounter (Signed)
Called pt to schedule AWV. Pt also wanted me to let Dr. Tamala Julian know that she went to a local urgent care today and was diagnosed with shingles but she was able to get everything she needed and just wanted to make Dr. Tamala Julian aware. Her AWV and physical is scheduled for February, 2019.    Josepha Pigg, B.A.  Care Guide - [facility] [place]

## 2017-05-12 ENCOUNTER — Other Ambulatory Visit: Payer: Self-pay | Admitting: Family Medicine

## 2017-05-13 NOTE — Telephone Encounter (Signed)
Refill request. Last office visit 02/10/17. Thanks.

## 2017-06-18 ENCOUNTER — Ambulatory Visit (INDEPENDENT_AMBULATORY_CARE_PROVIDER_SITE_OTHER): Payer: PPO

## 2017-06-18 VITALS — BP 116/70 | HR 68 | Temp 98.1°F | Ht 61.0 in | Wt 142.2 lb

## 2017-06-18 DIAGNOSIS — E7800 Pure hypercholesterolemia, unspecified: Secondary | ICD-10-CM | POA: Diagnosis not present

## 2017-06-18 DIAGNOSIS — Z0000 Encounter for general adult medical examination without abnormal findings: Secondary | ICD-10-CM | POA: Diagnosis not present

## 2017-06-18 DIAGNOSIS — I10 Essential (primary) hypertension: Secondary | ICD-10-CM | POA: Diagnosis not present

## 2017-06-18 NOTE — Progress Notes (Signed)
Subjective:   Maria Sweeney is a 67 y.o. female who presents for Medicare Annual (Subsequent) preventive examination.  Review of Systems:  N/A Cardiac Risk Factors include: advanced age (>73men, >62 women);dyslipidemia;hypertension;sedentary lifestyle     Objective:     Vitals: BP 116/70   Pulse 68   Temp 98.1 F (36.7 C) (Oral)   Ht 5\' 1"  (1.549 m)   Wt 142 lb 4 oz (64.5 kg)   SpO2 96%   BMI 26.88 kg/m   Body mass index is 26.88 kg/m.  Advanced Directives 06/18/2017 06/25/2016 06/18/2016 06/08/2016 03/19/2016  Does Patient Have a Medical Advance Directive? No No No No No  Would patient like information on creating a medical advance directive? No - Patient declined No - Patient declined - No - Patient declined No - patient declined information    Tobacco Social History   Tobacco Use  Smoking Status Never Smoker  Smokeless Tobacco Never Used     Counseling given: Not Answered   Clinical Intake:  Pre-visit preparation completed: Yes  Pain : No/denies pain     Nutritional Status: BMI 25 -29 Overweight Nutritional Risks: None Diabetes: No  How often do you need to have someone help you when you read instructions, pamphlets, or other written materials from your doctor or pharmacy?: 1 - Never What is the last grade level you completed in school?: Bachelors degreee  Interpreter Needed?: No  Information entered by :: Andrez Grime, LPN  Past Medical History:  Diagnosis Date  . Anxiety   . Cervical spondylosis   . Depression   . GERD (gastroesophageal reflux disease)   . Hyperlipidemia   . Hypertension   . IBS (irritable bowel syndrome)    Constipation with diarrhea.  . Oral herpes    Past Surgical History:  Procedure Laterality Date  . ABDOMINAL HYSTERECTOMY  1978   DUB; ovaries intact.  . APPENDECTOMY  1988  . BREAST BIOPSY Bilateral 2009   Calcium Deposit- 1980's and also in 2009/ Right side 1995  . BREAST EXCISIONAL BIOPSY Bilateral   .  COLONOSCOPY  05/11/2009   Normal.  Previous colonoscopy with polyps. [facility].    . DEBRIDEMENT TENNIS ELBOW    . ROTATOR CUFF REPAIR     Family History  Problem Relation Age of Onset  . Hyperlipidemia Mother   . Hypertension Mother   . Cancer Mother        lung  . Diabetes Mother   . Glaucoma Mother   . Stroke Mother 80  . Heart disease Mother   . Asthma Father   . Emphysema Father   . COPD Father   . Glaucoma Father   . Hypertension Sister   . Breast cancer Neg Hx    Social History   Socioeconomic History  . Marital status: Divorced    Spouse name: None  . Number of children: 2  . Years of education: Bachelors  . Highest education level: None  Social Needs  . Financial resource strain: Not hard at all  . Food insecurity - worry: Never true  . Food insecurity - inability: Never true  . Transportation needs - medical: No  . Transportation needs - non-medical: No  Occupational History  . Occupation: Chemical engineer: [employer]    Comment: also works at [employer]  Tobacco Use  . Smoking status: Never Smoker  . Smokeless tobacco: Never Used  Substance and Sexual Activity  . Alcohol use: No    Frequency:  Never  . Drug use: No  . Sexual activity: Yes    Partners: Male    Birth control/protection: Surgical  Other Topics Concern  . None  Social History Narrative   Marital status: divorced since 1999; dating seriously in 2018 since 2015.      Children: one son, one daughter; 2 grandchildren.      Lives: alone in house with dogs     Employment:  Retired.      Tobacco: none      Alcohol: rare      Drugs: none      Exercise:  Sporadic   Right-handed.   Occasional caffeine use.      ADLs: independent with ADLs; drives; no assistant devices.      Advanced Directives: none; FULL CODE.    Outpatient Encounter Medications as of 06/18/2017  Medication Sig  . albuterol (PROVENTIL HFA;VENTOLIN HFA) 108 (90 Base) MCG/ACT inhaler Inhale 2 puffs into the lungs every  4 (four) hours as needed for wheezing or shortness of breath (cough, shortness of breath or wheezing.).  Marland Kitchen ARIPiprazole (ABILIFY) 5 MG tablet Take 1 tablet (5 mg total) by mouth daily.  Marland Kitchen FLUoxetine (PROZAC) 20 MG capsule Take 2 capsules (40 mg total) by mouth daily.  . methocarbamol (ROBAXIN) 500 MG tablet TAKE 1 OR 2 TABLETS BY MOUTH AT BEDTIME  . metoprolol succinate (TOPROL-XL) 25 MG 24 hr tablet TAKE 1 TABLET BY MOUTH ONCE A DAY  . pantoprazole (PROTONIX) 40 MG tablet Take 1 tablet (40 mg total) by mouth daily.  . pravastatin (PRAVACHOL) 40 MG tablet Take 1 tablet (40 mg total) by mouth daily.  . ranitidine (ZANTAC) 150 MG tablet Take 1 tablet (150 mg total) by mouth at bedtime.  Marland Kitchen spironolactone (ALDACTONE) 50 MG tablet Take 1 tablet (50 mg total) by mouth daily.  . valACYclovir (VALTREX) 1000 MG tablet Take 1 tablet (1,000 mg total) by mouth 2 (two) times daily.   No facility-administered encounter medications on file as of 06/18/2017.     Activities of Daily Living In your present state of health, do you have any difficulty performing the following activities: 06/18/2017  Hearing? N  Vision? Y  Comment Patient has astigmatism and glaucoma   Difficulty concentrating or making decisions? N  Walking or climbing stairs? N  Dressing or bathing? N  Doing errands, shopping? N  Preparing Food and eating ? N  Using the Toilet? N  In the past six months, have you accidently leaked urine? Y  Comment Patient has issues with urine leakage sometimes.   Do you have problems with loss of bowel control? N  Managing your Medications? N  Managing your Finances? N  Housekeeping or managing your Housekeeping? N  Some recent data might be hidden    Patient Care Team: Wardell Honour, MD as PCP - General (Family Medicine)    Assessment:   This is a routine wellness examination for [name].  Exercise Activities and Dietary recommendations Current Exercise Habits: The patient does not participate  in regular exercise at present, Exercise limited by: None identified  Goals    . Exercise 3x per week (30 min per time)     Patient states that she wants to try to start exercising more on a consistent basis.        Fall Risk Fall Risk  06/18/2017 02/10/2017 02/10/2017 08/31/2016 08/11/2016  Falls in the past year? Yes Yes No No No  Number falls in past yr: 2 or more  1 - - -  Comment - - - - -  Injury with Fall? No No - - -  Comment - - - - -  Risk for fall due to : Other (Comment) - - - -  Risk for fall due to: Comment slipped in bathtub, dog tripped her - - - -  Follow up Falls prevention discussed - - - -   Is the patient's home free of loose throw rugs in walkways, pet beds, electrical cords, etc?   yes      Grab bars in the bathroom? no      Handrails on the stairs?   yes      Adequate lighting?   yes  Timed Get Up and Go performed: yes, completed within 30 seconds   Depression Screen PHQ 2/9 Scores 06/18/2017 02/10/2017 08/11/2016 05/12/2016  PHQ - 2 Score 4 0 0 0  PHQ- 9 Score 11 - - -     Cognitive Function     6CIT Screen 06/18/2017  What Year? 0 points  What month? 0 points  What time? 0 points  Count back from 20 0 points  Months in reverse 0 points  Repeat phrase 6 points  Total Score 6    Immunization History  Administered Date(s) Administered  . Influenza,inj,Quad PF,6+ Mos 02/22/2015, 02/11/2016, 02/10/2017  . Pneumococcal Conjugate-13 04/26/2015  . Pneumococcal Polysaccharide-23 05/12/2016    Qualifies for Shingles Vaccine?Advised patient to check with her pharmacy about receiving the Shingrix vaccine   Screening Tests Health Maintenance  Topic Date Due  . TETANUS/TDAP  06/18/2018 (Originally 02/13/1970)  . COLONOSCOPY  06/19/2018  . MAMMOGRAM  08/28/2018  . INFLUENZA VACCINE  Completed  . DEXA SCAN  Completed  . Hepatitis C Screening  Completed  . PNA vac Low Risk Adult  Completed    Cancer Screenings: Lung: Low Dose CT Chest recommended if Age  82-80 years, 30 pack-year currently smoking OR have quit w/in 15years. Patient does not qualify. Breast:  Up to date on Mammogram? Yes, completed 09/04/2016   Up to date of Bone Density/Dexa? Yes, completed 09/06/2015 Colorectal: colonoscopy completed 06/19/2008  Additional Screenings:  Hepatitis B/HIV/Syphillis: not indicated  Hepatitis C Screening: completed 02/22/2015  Tetanus declined due to insurance     Plan:   I have personally reviewed and noted the following in the patient's chart:   . Medical and social history . Use of alcohol, tobacco or illicit drugs  . Current medications and supplements . Functional ability and status . Nutritional status . Physical activity . Advanced directives . List of other physicians . Hospitalizations, surgeries, and ER visits in previous 12 months . Vitals . Screenings to include cognitive, depression, and falls . Referrals and appointments  In addition, I have reviewed and discussed with patient certain preventive protocols, quality metrics, and best practice recommendations. A written personalized care plan for preventive services as well as general preventive health recommendations were provided to patient.   1. Pure hypercholesterolemia - Lipid panel  2. Essential hypertension - Comprehensive metabolic panel - CBC with Differential/Platelet  3. Encounter for Medicare annual wellness exam   Andrez Grime, LPN  [id number]

## 2017-06-18 NOTE — Patient Instructions (Addendum)
Maria Sweeney , Thank you for taking time to come for your Medicare Wellness Visit. I appreciate your ongoing commitment to your health goals. Please review the following plan we discussed and let me know if I can assist you in the future.   Screening recommendations/referrals: Colonoscopy: up to date, next due 06/19/2018 Mammogram: up to date, next due 09/05/2018 Bone Density: up to date, next due 09/05/2020 Recommended yearly ophthalmology/optometry visit for glaucoma screening and checkup Recommended yearly dental visit for hygiene and checkup  Vaccinations: Influenza vaccine: up to date Pneumococcal vaccine: up to date Tdap vaccine: declined due to insurance Shingles vaccine: Check with your pharmacy about receiving the Shingrix vaccine    Advanced directives: Advance directive discussed with you today. Even though you declined this today please call our office should you change your mind and we can give you the proper paperwork for you to fill out.  Conditions/risks identified: Try to start exercising more on a consistent basis.    Next appointment: 06/30/17 @ 3:20 pm with Dr. Tamala Julian, next AWV in 1 year    Preventive Care 68 Years and Older, Female Preventive care refers to lifestyle choices and visits with your health care provider that can promote health and wellness. What does preventive care include?  A yearly physical exam. This is also called an annual well check.  Dental exams once or twice a year.  Routine eye exams. Ask your health care provider how often you should have your eyes checked.  Personal lifestyle choices, including:  Daily care of your teeth and gums.  Regular physical activity.  Eating a healthy diet.  Avoiding tobacco and drug use.  Limiting alcohol use.  Practicing safe sex.  Taking low-dose aspirin every day.  Taking vitamin and mineral supplements as recommended by your health care provider. What happens during an annual well check? The  services and screenings done by your health care provider during your annual well check will depend on your age, overall health, lifestyle risk factors, and family history of disease. Counseling  Your health care provider may ask you questions about your:  Alcohol use.  Tobacco use.  Drug use.  Emotional well-being.  Home and relationship well-being.  Sexual activity.  Eating habits.  History of falls.  Memory and ability to understand (cognition).  Work and work Statistician.  Reproductive health. Screening  You may have the following tests or measurements:  Height, weight, and BMI.  Blood pressure.  Lipid and cholesterol levels. These may be checked every 5 years, or more frequently if you are over 2 years old.  Skin check.  Lung cancer screening. You may have this screening every year starting at age 13 if you have a 30-pack-year history of smoking and currently smoke or have quit within the past 15 years.  Fecal occult blood test (FOBT) of the stool. You may have this test every year starting at age 68.  Flexible sigmoidoscopy or colonoscopy. You may have a sigmoidoscopy every 5 years or a colonoscopy every 10 years starting at age 68.  Hepatitis C blood test.  Hepatitis B blood test.  Sexually transmitted disease (STD) testing.  Diabetes screening. This is done by checking your blood sugar (glucose) after you have not eaten for a while (fasting). You may have this done every 1-3 years.  Bone density scan. This is done to screen for osteoporosis. You may have this done starting at age 17.  Mammogram. This may be done every 1-2 years. Talk to your health  care provider about how often you should have regular mammograms. Talk with your health care provider about your test results, treatment options, and if necessary, the need for more tests. Vaccines  Your health care provider may recommend certain vaccines, such as:  Influenza vaccine. This is recommended  every year.  Tetanus, diphtheria, and acellular pertussis (Tdap, Td) vaccine. You may need a Td booster every 10 years.  Zoster vaccine. You may need this after age 21.  Pneumococcal 13-valent conjugate (PCV13) vaccine. One dose is recommended after age 68.  Pneumococcal polysaccharide (PPSV23) vaccine. One dose is recommended after age 46. Talk to your health care provider about which screenings and vaccines you need and how often you need them. This information is not intended to replace advice given to you by your health care provider. Make sure you discuss any questions you have with your health care provider. Document Released: 05/24/2015 Document Revised: 01/15/2016 Document Reviewed: 02/26/2015 Elsevier Interactive Patient Education  2017 [place] Prevention in the Home Falls can cause injuries. They can happen to people of all ages. There are many things you can do to make your home safe and to help prevent falls. What can I do on the outside of my home?  Regularly fix the edges of walkways and driveways and fix any cracks.  Remove anything that might make you trip as you walk through a door, such as a raised step or threshold.  Trim any bushes or trees on the path to your home.  Use bright outdoor lighting.  Clear any walking paths of anything that might make someone trip, such as rocks or tools.  Regularly check to see if handrails are loose or broken. Make sure that both sides of any steps have handrails.  Any raised decks and porches should have guardrails on the edges.  Have any leaves, snow, or ice cleared regularly.  Use sand or salt on walking paths during winter.  Clean up any spills in your garage right away. This includes oil or grease spills. What can I do in the bathroom?  Use night lights.  Install grab bars by the toilet and in the tub and shower. Do not use towel bars as grab bars.  Use non-skid mats or decals in the tub or shower.  If  you need to sit down in the shower, use a plastic, non-slip stool.  Keep the floor dry. Clean up any water that spills on the floor as soon as it happens.  Remove soap buildup in the tub or shower regularly.  Attach bath mats securely with double-sided non-slip rug tape.  Do not have throw rugs and other things on the floor that can make you trip. What can I do in the bedroom?  Use night lights.  Make sure that you have a light by your bed that is easy to reach.  Do not use any sheets or blankets that are too big for your bed. They should not hang down onto the floor.  Have a firm chair that has side arms. You can use this for support while you get dressed.  Do not have throw rugs and other things on the floor that can make you trip. What can I do in the kitchen?  Clean up any spills right away.  Avoid walking on wet floors.  Keep items that you use a lot in easy-to-reach places.  If you need to reach something above you, use a strong step stool that has a grab  bar.  Keep electrical cords out of the way.  Do not use floor polish or wax that makes floors slippery. If you must use wax, use non-skid floor wax.  Do not have throw rugs and other things on the floor that can make you trip. What can I do with my stairs?  Do not leave any items on the stairs.  Make sure that there are handrails on both sides of the stairs and use them. Fix handrails that are broken or loose. Make sure that handrails are as long as the stairways.  Check any carpeting to make sure that it is firmly attached to the stairs. Fix any carpet that is loose or worn.  Avoid having throw rugs at the top or bottom of the stairs. If you do have throw rugs, attach them to the floor with carpet tape.  Make sure that you have a light switch at the top of the stairs and the bottom of the stairs. If you do not have them, ask someone to add them for you. What else can I do to help prevent falls?  Wear shoes  that:  Do not have high heels.  Have rubber bottoms.  Are comfortable and fit you well.  Are closed at the toe. Do not wear sandals.  If you use a stepladder:  Make sure that it is fully opened. Do not climb a closed stepladder.  Make sure that both sides of the stepladder are locked into place.  Ask someone to hold it for you, if possible.  Clearly mark and make sure that you can see:  Any grab bars or handrails.  First and last steps.  Where the edge of each step is.  Use tools that help you move around (mobility aids) if they are needed. These include:  Canes.  Walkers.  Scooters.  Crutches.  Turn on the lights when you go into a dark area. Replace any light bulbs as soon as they burn out.  Set up your furniture so you have a clear path. Avoid moving your furniture around.  If any of your floors are uneven, fix them.  If there are any pets around you, be aware of where they are.  Review your medicines with your doctor. Some medicines can make you feel dizzy. This can increase your chance of falling. Ask your doctor what other things that you can do to help prevent falls. This information is not intended to replace advice given to you by your health care provider. Make sure you discuss any questions you have with your health care provider. Document Released: 02/21/2009 Document Revised: 10/03/2015 Document Reviewed: 06/01/2014 Elsevier Interactive Patient Education  2017 [employer].

## 2017-06-19 LAB — CBC WITH DIFFERENTIAL/PLATELET
BASOS: 0 %
Basophils Absolute: 0 10*3/uL (ref 0.0–0.2)
EOS (ABSOLUTE): 0.2 10*3/uL (ref 0.0–0.4)
EOS: 2 %
HEMATOCRIT: 43.6 % (ref 34.0–46.6)
HEMOGLOBIN: 14.7 g/dL (ref 11.1–15.9)
Immature Grans (Abs): 0 10*3/uL (ref 0.0–0.1)
Immature Granulocytes: 0 %
LYMPHS ABS: 1.9 10*3/uL (ref 0.7–3.1)
Lymphs: 20 %
MCH: 32 pg (ref 26.6–33.0)
MCHC: 33.7 g/dL (ref 31.5–35.7)
MCV: 95 fL (ref 79–97)
MONOCYTES: 5 %
MONOS ABS: 0.4 10*3/uL (ref 0.1–0.9)
NEUTROS ABS: 6.8 10*3/uL (ref 1.4–7.0)
Neutrophils: 73 %
Platelets: 292 10*3/uL (ref 150–379)
RBC: 4.59 x10E6/uL (ref 3.77–5.28)
RDW: 13.8 % (ref 12.3–15.4)
WBC: 9.3 10*3/uL (ref 3.4–10.8)

## 2017-06-19 LAB — LIPID PANEL
CHOL/HDL RATIO: 4.1 ratio (ref 0.0–4.4)
Cholesterol, Total: 219 mg/dL — ABNORMAL HIGH (ref 100–199)
HDL: 53 mg/dL (ref >39)
LDL CALC: 100 mg/dL — AB (ref 0–99)
Triglycerides: 331 mg/dL — ABNORMAL HIGH (ref 0–149)
VLDL CHOLESTEROL CAL: 66 mg/dL — AB (ref 5–40)

## 2017-06-19 LAB — COMPREHENSIVE METABOLIC PANEL
ALBUMIN: 5 g/dL — AB (ref 3.6–4.8)
ALK PHOS: 96 IU/L (ref 39–117)
ALT: 30 IU/L (ref 0–32)
AST: 23 IU/L (ref 0–40)
Albumin/Globulin Ratio: 1.8 (ref 1.2–2.2)
BUN / CREAT RATIO: 17 (ref 12–28)
BUN: 18 mg/dL (ref 8–27)
Bilirubin Total: 0.7 mg/dL (ref 0.0–1.2)
CO2: 18 mmol/L — AB (ref 20–29)
CREATININE: 1.09 mg/dL — AB (ref 0.57–1.00)
Calcium: 10.3 mg/dL (ref 8.7–10.3)
Chloride: 104 mmol/L (ref 96–106)
GFR calc non Af Amer: 53 mL/min/{1.73_m2} — ABNORMAL LOW (ref >59)
GFR, EST AFRICAN AMERICAN: 61 mL/min/{1.73_m2} (ref >59)
Globulin, Total: 2.8 g/dL (ref 1.5–4.5)
Glucose: 110 mg/dL — ABNORMAL HIGH (ref 65–99)
Potassium: 4.9 mmol/L (ref 3.5–5.2)
Sodium: 144 mmol/L (ref 134–144)
Total Protein: 7.8 g/dL (ref 6.0–8.5)

## 2017-06-21 ENCOUNTER — Encounter: Payer: PPO | Admitting: Family Medicine

## 2017-06-30 ENCOUNTER — Encounter: Payer: Self-pay | Admitting: Family Medicine

## 2017-06-30 ENCOUNTER — Ambulatory Visit (INDEPENDENT_AMBULATORY_CARE_PROVIDER_SITE_OTHER): Payer: PPO | Admitting: Family Medicine

## 2017-06-30 ENCOUNTER — Other Ambulatory Visit: Payer: Self-pay

## 2017-06-30 VITALS — BP 138/70 | HR 79 | Temp 98.0°F | Resp 16 | Ht 61.81 in | Wt 145.0 lb

## 2017-06-30 DIAGNOSIS — K219 Gastro-esophageal reflux disease without esophagitis: Secondary | ICD-10-CM | POA: Diagnosis not present

## 2017-06-30 DIAGNOSIS — F419 Anxiety disorder, unspecified: Secondary | ICD-10-CM

## 2017-06-30 DIAGNOSIS — Z0000 Encounter for general adult medical examination without abnormal findings: Secondary | ICD-10-CM

## 2017-06-30 DIAGNOSIS — I10 Essential (primary) hypertension: Secondary | ICD-10-CM | POA: Diagnosis not present

## 2017-06-30 DIAGNOSIS — E7800 Pure hypercholesterolemia, unspecified: Secondary | ICD-10-CM | POA: Diagnosis not present

## 2017-06-30 DIAGNOSIS — F329 Major depressive disorder, single episode, unspecified: Secondary | ICD-10-CM | POA: Diagnosis not present

## 2017-06-30 DIAGNOSIS — E2839 Other primary ovarian failure: Secondary | ICD-10-CM | POA: Diagnosis not present

## 2017-06-30 DIAGNOSIS — M5030 Other cervical disc degeneration, unspecified cervical region: Secondary | ICD-10-CM | POA: Diagnosis not present

## 2017-06-30 DIAGNOSIS — F99 Mental disorder, not otherwise specified: Secondary | ICD-10-CM | POA: Diagnosis not present

## 2017-06-30 DIAGNOSIS — F5105 Insomnia due to other mental disorder: Secondary | ICD-10-CM

## 2017-06-30 DIAGNOSIS — L659 Nonscarring hair loss, unspecified: Secondary | ICD-10-CM

## 2017-06-30 DIAGNOSIS — F32A Depression, unspecified: Secondary | ICD-10-CM

## 2017-06-30 LAB — POCT URINALYSIS DIP (MANUAL ENTRY)
BILIRUBIN UA: NEGATIVE
BILIRUBIN UA: NEGATIVE mg/dL
GLUCOSE UA: NEGATIVE mg/dL
Leukocytes, UA: NEGATIVE
Nitrite, UA: NEGATIVE
Protein Ur, POC: NEGATIVE mg/dL
UROBILINOGEN UA: 0.2 U/dL
pH, UA: 6 (ref 5.0–8.0)

## 2017-06-30 MED ORDER — METOPROLOL SUCCINATE ER 25 MG PO TB24
25.0000 mg | ORAL_TABLET | Freq: Every day | ORAL | 3 refills | Status: DC
Start: 2017-06-30 — End: 2018-08-02

## 2017-06-30 MED ORDER — RANITIDINE HCL 150 MG PO TABS: 150.0000 mg | ORAL_TABLET | Freq: Every day | ORAL | 1 refills | Status: DC

## 2017-06-30 MED ORDER — FLUOXETINE HCL 20 MG PO CAPS: 40.0000 mg | ORAL_CAPSULE | Freq: Every day | ORAL | 1 refills | Status: DC

## 2017-06-30 MED ORDER — VALACYCLOVIR HCL 1 G PO TABS: 1000.0000 mg | ORAL_TABLET | Freq: Two times a day (BID) | ORAL | 1 refills | Status: DC

## 2017-06-30 MED ORDER — PRAVASTATIN SODIUM 40 MG PO TABS: 40.0000 mg | ORAL_TABLET | Freq: Every day | ORAL | 3 refills | Status: DC

## 2017-06-30 MED ORDER — PANTOPRAZOLE SODIUM 40 MG PO TBEC
40.0000 mg | DELAYED_RELEASE_TABLET | Freq: Every day | ORAL | 3 refills | Status: DC
Start: 2017-06-30 — End: 2018-08-02

## 2017-06-30 MED ORDER — SPIRONOLACTONE 50 MG PO TABS: 50.0000 mg | ORAL_TABLET | Freq: Every day | ORAL | 3 refills | Status: DC

## 2017-06-30 NOTE — Patient Instructions (Addendum)
WEAN DOWN TO ABILIFY 5MG 1/2 TABLET DAILY FOR TWO WEEKS AND THEN STOP.    Preventive Care 68 Years and Older, Female Preventive care refers to lifestyle choices and visits with your health care provider that can promote health and wellness. What does preventive care include?  A yearly physical exam. This is also called an annual well check.  Dental exams once or twice a year.  Routine eye exams. Ask your health care provider how often you should have your eyes checked.  Personal lifestyle choices, including: ? Daily care of your teeth and gums. ? Regular physical activity. ? Eating a healthy diet. ? Avoiding tobacco and drug use. ? Limiting alcohol use. ? Practicing safe sex. ? Taking low-dose aspirin every day. ? Taking vitamin and mineral supplements as recommended by your health care provider. What happens during an annual well check? The services and screenings done by your health care provider during your annual well check will depend on your age, overall health, lifestyle risk factors, and family history of disease. Counseling Your health care provider may ask you questions about your:  Alcohol use.  Tobacco use.  Drug use.  Emotional well-being.  Home and relationship well-being.  Sexual activity.  Eating habits.  History of falls.  Memory and ability to understand (cognition).  Work and work Statistician.  Reproductive health.  Screening You may have the following tests or measurements:  Height, weight, and BMI.  Blood pressure.  Lipid and cholesterol levels. These may be checked every 5 years, or more frequently if you are over 37 years old.  Skin check.  Lung cancer screening. You may have this screening every year starting at age 5 if you have a 30-pack-year history of smoking and currently smoke or have quit within the past 15 years.  Fecal occult blood test (FOBT) of the stool. You may have this test every year starting at age  9.  Flexible sigmoidoscopy or colonoscopy. You may have a sigmoidoscopy every 5 years or a colonoscopy every 10 years starting at age 55.  Hepatitis C blood test.  Hepatitis B blood test.  Sexually transmitted disease (STD) testing.  Diabetes screening. This is done by checking your blood sugar (glucose) after you have not eaten for a while (fasting). You may have this done every 1-3 years.  Bone density scan. This is done to screen for osteoporosis. You may have this done starting at age 66.  Mammogram. This may be done every 1-2 years. Talk to your health care provider about how often you should have regular mammograms.  Talk with your health care provider about your test results, treatment options, and if necessary, the need for more tests. Vaccines Your health care provider may recommend certain vaccines, such as:  Influenza vaccine. This is recommended every year.  Tetanus, diphtheria, and acellular pertussis (Tdap, Td) vaccine. You may need a Td booster every 10 years.  Varicella vaccine. You may need this if you have not been vaccinated.  Zoster vaccine. You may need this after age 68.  Measles, mumps, and rubella (MMR) vaccine. You may need at least one dose of MMR if you were born in 1957 or later. You may also need a second dose.  Pneumococcal 13-valent conjugate (PCV13) vaccine. One dose is recommended after age 33.  Pneumococcal polysaccharide (PPSV23) vaccine. One dose is recommended after age 25.  Meningococcal vaccine. You may need this if you have certain conditions.  Hepatitis A vaccine. You may need this if you have  certain conditions or if you travel or work in places where you may be exposed to hepatitis A.  Hepatitis B vaccine. You may need this if you have certain conditions or if you travel or work in places where you may be exposed to hepatitis B.  Haemophilus influenzae type b (Hib) vaccine. You may need this if you have certain conditions.  Talk to  your health care provider about which screenings and vaccines you need and how often you need them. This information is not intended to replace advice given to you by your health care provider. Make sure you discuss any questions you have with your health care provider. Document Released: 05/24/2015 Document Revised: 01/15/2016 Document Reviewed: 02/26/2015 Elsevier Interactive Patient Education  2018 [employer].    IF you received an x-ray today, you will receive an invoice from [facility]. Please contact [facility] at [phone] with questions or concerns regarding your invoice.   IF you received labwork today, you will receive an invoice from [facility]. Please contact [facility] at [phone] with questions or concerns regarding your invoice.   Our billing staff will not be able to assist you with questions regarding bills from these companies.  You will be contacted with the lab results as soon as they are available. The fastest way to get your results is to activate your My Chart account. Instructions are located on the last page of this paperwork. If you have not heard from [place] regarding the results in 2 weeks, please contact this office.

## 2017-06-30 NOTE — Progress Notes (Signed)
Subjective:    Patient ID: Maria Sweeney, female    DOB: Jan 25, 1951, 67 y.o.   MRN: [id number]  06/30/2017  Annual Exam    HPI This 66 y.o. female presents for Complete Physical Examination and follow-up of chronic medical conditions including hypertension, hypercholesterolemia, anxiety with depression.  Last physical:  05-12-2016, 06-18-2017 AWV with LPN Calandra. Pap smear:  N/d; hysterectomy Mammogram:  09-04-2016 Colonoscopy:  2010 Bone density:  2017 Eye exam:  Due; 2018; contact R eye. Glaucoma.  Dental exam:   Not regularly.  Due; not dentist right now.    Anxiety and depression: isolated; does not want to leave the house.  Since stopping Clonazepam, does not talk nearly as much.  Does not see benefit to Abilify.  Hair loss: worried about medication side effect.    HTN: Patient reports good compliance with medication, good tolerance to medication, and good symptom control.   Hypercholesterolemia: Patient reports good compliance with medication, good tolerance to medication, and good symptom control.    GERD: moderately controlled with Protonix 40mg  daily with Zantac at bedtime.     Visual Acuity Screening   Right eye Left eye Both eyes  Without correction:     With correction: 20/30 20/30 20/30     BP Readings from Last 3 Encounters:  06/30/17 138/70  06/18/17 116/70  02/10/17 136/70   Wt Readings from Last 3 Encounters:  06/30/17 145 lb (65.8 kg)  06/18/17 142 lb 4 oz (64.5 kg)  02/10/17 142 lb (64.4 kg)   Immunization History  Administered Date(s) Administered  . Influenza,inj,Quad PF,6+ Mos 02/22/2015, 02/11/2016, 02/10/2017  . Pneumococcal Conjugate-13 04/26/2015  . Pneumococcal Polysaccharide-23 05/12/2016   Health Maintenance  Topic Date Due  . TETANUS/TDAP  06/18/2018 (Originally 02/13/1969)  . COLONOSCOPY  06/19/2018  . MAMMOGRAM  08/28/2018  . INFLUENZA VACCINE  Completed  . DEXA SCAN  Completed  . Hepatitis C Screening  Completed  . PNA vac Low  Risk Adult  Completed    Review of Systems  Constitutional: Negative for activity change, appetite change, chills, diaphoresis, fatigue, fever and unexpected weight change.  HENT: Negative for congestion, dental problem, drooling, ear discharge, ear pain, facial swelling, hearing loss, mouth sores, nosebleeds, postnasal drip, rhinorrhea, sinus pressure, sneezing, sore throat, tinnitus, trouble swallowing and voice change.   Eyes: Negative for photophobia, pain, discharge, redness, itching and visual disturbance.  Respiratory: Negative for apnea, cough, choking, chest tightness, shortness of breath, wheezing and stridor.   Cardiovascular: Negative for chest pain, palpitations and leg swelling.  Gastrointestinal: Negative for abdominal distention, abdominal pain, anal bleeding, blood in stool, constipation, diarrhea, nausea, rectal pain and vomiting.  Endocrine: Negative for cold intolerance, heat intolerance, polydipsia, polyphagia and polyuria.  Genitourinary: Negative for decreased urine volume, difficulty urinating, dyspareunia, dysuria, enuresis, flank pain, frequency, genital sores, hematuria, menstrual problem, pelvic pain, urgency, vaginal bleeding, vaginal discharge and vaginal pain.       Nocturia x 1.  Urinary incontinence urge intermittent.  Musculoskeletal: Negative for arthralgias, back pain, gait problem, joint swelling, myalgias, neck pain and neck stiffness.  Skin: Negative for color change, pallor, rash and wound.  Allergic/Immunologic: Negative for environmental allergies, food allergies and immunocompromised state.  Neurological: Positive for dizziness. Negative for tremors, seizures, syncope, facial asymmetry, speech difficulty, weakness, light-headedness, numbness and headaches.  Hematological: Negative for adenopathy. Does not bruise/bleed easily.  Psychiatric/Behavioral: Positive for dysphoric mood. Negative for agitation, behavioral problems, confusion, decreased  concentration, hallucinations, self-injury, sleep disturbance and suicidal ideas. The patient  is nervous/anxious. The patient is not hyperactive.        Bedtime 3:00am; wakes up 7:00am.     Past Medical History:  Diagnosis Date  . Anxiety   . Cervical spondylosis   . Depression   . GERD (gastroesophageal reflux disease)   . Hyperlipidemia   . Hypertension   . IBS (irritable bowel syndrome)    Constipation with diarrhea.  . Oral herpes    Past Surgical History:  Procedure Laterality Date  . ABDOMINAL HYSTERECTOMY  1978   DUB; ovaries intact.  . APPENDECTOMY  1988  . BREAST BIOPSY Bilateral 2009   Calcium Deposit- 1980's and also in 2009/ Right side 1995  . BREAST EXCISIONAL BIOPSY Bilateral   . COLONOSCOPY  05/11/2009   Normal.  Previous colonoscopy with polyps. [facility].    . DEBRIDEMENT TENNIS ELBOW    . ROTATOR CUFF REPAIR     Allergies  Allergen Reactions  . Amoxicillin   . Augmentin [Amoxicillin-Pot Clavulanate]   . Codeine   . Depakote [Divalproex Sodium]   . Latex   . Lipitor [Atorvastatin]   . Penicillins   . Septra [Sulfamethoxazole-Trimethoprim]   . Zocor [Simvastatin]    Current Outpatient Medications on File Prior to Visit  Medication Sig Dispense Refill  . albuterol (PROVENTIL HFA;VENTOLIN HFA) 108 (90 Base) MCG/ACT inhaler Inhale 2 puffs into the lungs every 4 (four) hours as needed for wheezing or shortness of breath (cough, shortness of breath or wheezing.). 1 Inhaler 1  . ARIPiprazole (ABILIFY) 5 MG tablet Take 1 tablet (5 mg total) by mouth daily. 90 tablet 3  . methocarbamol (ROBAXIN) 500 MG tablet TAKE 1 OR 2 TABLETS BY MOUTH AT BEDTIME 60 tablet 1   No current facility-administered medications on file prior to visit.    Social History   Socioeconomic History  . Marital status: Divorced    Spouse name: Not on file  . Number of children: 2  . Years of education: Bachelors  . Highest education level: Not on file  Social Needs  . Financial  resource strain: Not hard at all  . Food insecurity - worry: Never true  . Food insecurity - inability: Never true  . Transportation needs - medical: No  . Transportation needs - non-medical: No  Occupational History  . Occupation: Chemical engineer: [employer]    Comment: also works at [employer]  Tobacco Use  . Smoking status: Never Smoker  . Smokeless tobacco: Never Used  Substance and Sexual Activity  . Alcohol use: No    Frequency: Never  . Drug use: No  . Sexual activity: Yes    Partners: Male    Birth control/protection: Surgical, Post-menopausal  Other Topics Concern  . Not on file  Social History Narrative   Marital status: divorced since 1999; dating seriously in 2019 since 2015.      Children: one son, one daughter; 2 grandchildren.      Lives: alone in house with 2 dogs     Employment:  Retired.  Needs to work in 2019.      Tobacco: none      Alcohol: rare      Drugs: none      Exercise:  Sporadic   Right-handed.   Occasional caffeine use.      ADLs: independent with ADLs; drives; no assistant devices.      Advanced Directives: none; FULL CODE.   Family History  Problem Relation Age of Onset  . Hyperlipidemia  Mother   . Hypertension Mother   . Cancer Mother        lung  . Diabetes Mother   . Glaucoma Mother   . Stroke Mother 30  . Heart disease Mother   . Asthma Father   . Emphysema Father   . COPD Father   . Glaucoma Father   . Hypertension Sister   . Breast cancer Neg Hx        Objective:    BP 138/70   Pulse 79   Temp 98 F (36.7 C) (Oral)   Resp 16   Ht 5' 1.81" (1.57 m)   Wt 145 lb (65.8 kg)   SpO2 95%   BMI 26.68 kg/m  Physical Exam  Constitutional: She is oriented to person, place, and time. She appears well-developed and well-nourished. No distress.  HENT:  Head: Normocephalic and atraumatic.  Right Ear: Hearing, tympanic membrane, external ear and ear canal normal.  Left Ear: Hearing, tympanic membrane, external ear and  ear canal normal.  Nose: Nose normal.  Mouth/Throat: Oropharynx is clear and moist.  Eyes: Conjunctivae and EOM are normal. Pupils are equal, round, and reactive to light.  Neck: Normal range of motion and full passive range of motion without pain. Neck supple. No JVD present. Carotid bruit is not present. No thyromegaly present.  Cardiovascular: Normal rate, regular rhythm, normal heart sounds and intact distal pulses. Exam reveals no gallop and no friction rub.  No murmur heard. Pulmonary/Chest: Effort normal and breath sounds normal. No respiratory distress. She has no wheezes. She has no rales. Right breast exhibits no inverted nipple, no mass, no nipple discharge, no skin change and no tenderness. Left breast exhibits no inverted nipple, no mass, no nipple discharge, no skin change and no tenderness. Breasts are symmetrical.  Abdominal: Soft. Bowel sounds are normal. She exhibits no distension and no mass. There is no tenderness. There is no rebound and no guarding.  Musculoskeletal:       Right shoulder: Normal.       Left shoulder: Normal.       Cervical back: Normal.  Lymphadenopathy:    She has no cervical adenopathy.  Neurological: She is alert and oriented to person, place, and time. She has normal reflexes. No cranial nerve deficit. She exhibits normal muscle tone. Coordination normal.  Skin: Skin is warm and dry. No rash noted. She is not diaphoretic. No erythema. No pallor.  Psychiatric: She has a normal mood and affect. Her behavior is normal. Judgment and thought content normal.  Nursing note and vitals reviewed.  No results found. Depression screen [facility] 2/9 06/30/2017 06/18/2017 02/10/2017 08/11/2016 05/12/2016  Decreased Interest 3 3 0 0 0  Down, Depressed, Hopeless 1 1 0 0 0  PHQ - 2 Score 4 4 0 0 0  Altered sleeping 3 3 - - -  Tired, decreased energy 1 0 - - -  Change in appetite 1 0 - - -  Feeling bad or failure about yourself  1 1 - - -  Trouble concentrating 1 3 - - -    Moving slowly or fidgety/restless 0 0 - - -  Suicidal thoughts 0 0 - - -  PHQ-9 Score 11 11 - - -  Difficult doing work/chores - Not difficult at all - - -   Fall Risk  06/30/2017 06/18/2017 02/10/2017 02/10/2017 08/31/2016  Falls in the past year? Yes Yes Yes No No  Number falls in past yr: 2 or more 2 or  more 1 - -  Comment - - - - -  Injury with Fall? No No No - -  Comment - - - - -  Risk for fall due to : - Other (Comment) - - -  Risk for fall due to: Comment - slipped in bathtub, dog tripped her - - -  Follow up - Falls prevention discussed - - -        Assessment & Plan:   1. Routine physical examination   2. Essential hypertension, benign   3. Gastroesophageal reflux disease without esophagitis   4. Degenerative disc disease, cervical   5. Anxiety and depression   6. Insomnia due to other mental disorder   7. Pure hypercholesterolemia   8. Estrogen deficiency   9. Hair loss     -anticipatory guidance provided --- exercise, weight loss, safe driving practices, aspirin 81mg  daily. -obtain age appropriate screening labs and labs for chronic disease management. -moderate fall risk; undergoing treatment for anxiety with depression; no evidence of hearing loss.  Discussed advanced directives and living will; also discussed end of life issues including code status. -new onset hair loss; obtain labs to rule out secondary causes. -anxiety uncontrolled on abilify; recommend discontinuing after decreasing to half dose for 2-4 weeks.  Continue Prozac 20mg  two daily.  Consider addition of Buspar if warranted.  Refer to psychology for psychotherapy. -hypertension well controlled; obtain labs; refills provided. -hypercholesterolemia controlled; refills; obtain labs. -GERD moderately controlled; refills provided; dietary modification encouraged.   Orders Placed This Encounter  Procedures  . DG Bone Density    Standing Status:   Future    Standing Expiration Date:   08/29/2018    Order  Specific Question:   Reason for Exam (SYMPTOM  OR DIAGNOSIS REQUIRED)    Answer:   estrogen deficiency    Order Specific Question:   Preferred imaging location?    Answer:   [facility]  . TSH  . VITAMIN D 25 Hydroxy (Vit-D Deficiency, Fractures)  . Iron  . Ambulatory referral to Psychology    Referral Priority:   Routine    Referral Type:   Psychiatric    Referral Reason:   Specialty Services Required    Requested Specialty:   Psychology    Number of Visits Requested:   1  . POCT urinalysis dipstick  . EKG 12-Lead   Meds ordered this encounter  Medications  . valACYclovir (VALTREX) 1000 MG tablet    Sig: Take 1 tablet (1,000 mg total) by mouth 2 (two) times daily.    Dispense:  30 tablet    Refill:  1  . spironolactone (ALDACTONE) 50 MG tablet    Sig: Take 1 tablet (50 mg total) by mouth daily.    Dispense:  90 tablet    Refill:  3  . ranitidine (ZANTAC) 150 MG tablet    Sig: Take 1 tablet (150 mg total) by mouth at bedtime.    Dispense:  90 tablet    Refill:  1  . pravastatin (PRAVACHOL) 40 MG tablet    Sig: Take 1 tablet (40 mg total) by mouth daily.    Dispense:  90 tablet    Refill:  3  . pantoprazole (PROTONIX) 40 MG tablet    Sig: Take 1 tablet (40 mg total) by mouth daily.    Dispense:  90 tablet    Refill:  3  . metoprolol succinate (TOPROL-XL) 25 MG 24 hr tablet    Sig: Take 1 tablet (25 mg  total) by mouth daily.    Dispense:  90 tablet    Refill:  3    PT WOULD LIKE A 90 DAY RX PLEASE. (WE HAVE A 30 DAY SCRIPTFROM 09/11/16) TAHNKS CHRIS  . FLUoxetine (PROZAC) 20 MG capsule    Sig: Take 2 capsules (40 mg total) by mouth daily.    Dispense:  180 capsule    Refill:  1    Return in about 6 months (around 12/28/2017) for follow-up chronic medical conditions.   Dhwani Venkatesh Elayne Guerin, M.D. Primary [facility] at [facility] [place] [address] [place], [place]  [address] [phone] phone [phone] fax

## 2017-07-01 LAB — IRON: IRON: 123 ug/dL (ref 27–139)

## 2017-07-01 LAB — VITAMIN D 25 HYDROXY (VIT D DEFICIENCY, FRACTURES): VIT D 25 HYDROXY: 16.5 ng/mL — AB (ref 30.0–100.0)

## 2017-07-01 LAB — TSH: TSH: 2.84 u[IU]/mL (ref 0.450–4.500)

## 2017-07-16 ENCOUNTER — Telehealth: Payer: Self-pay | Admitting: Family Medicine

## 2017-07-16 NOTE — Telephone Encounter (Signed)
Spoke to pt and rescheduled her appt due to Dr. Tamala Julian being out of the office. Rescheduled for 12/27/17 with Dr. Tamala Julian

## 2017-10-05 ENCOUNTER — Telehealth: Payer: Self-pay | Admitting: Family Medicine

## 2017-10-05 ENCOUNTER — Encounter: Payer: Self-pay | Admitting: Family Medicine

## 2017-10-05 NOTE — Telephone Encounter (Signed)
I called patient to reschedule/cancel appt with Dr. Tamala Julian on 12/27/2017. Pt states she will think about if she wants to transfer with Dr. Tamala Julian or stay with PCP. She states she will give [place] a call back.

## 2017-10-18 ENCOUNTER — Other Ambulatory Visit: Payer: Self-pay | Admitting: Family Medicine

## 2017-10-19 NOTE — Telephone Encounter (Signed)
Robaxin 500 mg refill request  LOV 06/30/17 with Dr. Tamala Julian  Last refill:  05/21/17  #60  Refills:  [place], [place].

## 2017-11-16 ENCOUNTER — Other Ambulatory Visit: Payer: Self-pay | Admitting: Family Medicine

## 2017-11-16 DIAGNOSIS — Z1231 Encounter for screening mammogram for malignant neoplasm of breast: Secondary | ICD-10-CM

## 2017-12-13 ENCOUNTER — Other Ambulatory Visit: Payer: Self-pay | Admitting: Family Medicine

## 2017-12-14 NOTE — Telephone Encounter (Signed)
Refills for albuterol (ventolin) inhaler  108, prescription expired on 02/10/17  And refill for methocarbamol 500 mg tab  LR  10/21/17 for #60 tabs  Zannie Cove (previous PCP)  LOV  06/30/17  [place]

## 2017-12-17 DIAGNOSIS — H401132 Primary open-angle glaucoma, bilateral, moderate stage: Secondary | ICD-10-CM | POA: Diagnosis not present

## 2017-12-22 ENCOUNTER — Encounter: Payer: Self-pay | Admitting: Family Medicine

## 2017-12-23 DIAGNOSIS — D1801 Hemangioma of skin and subcutaneous tissue: Secondary | ICD-10-CM | POA: Diagnosis not present

## 2017-12-23 DIAGNOSIS — L570 Actinic keratosis: Secondary | ICD-10-CM | POA: Diagnosis not present

## 2017-12-23 DIAGNOSIS — X32XXXA Exposure to sunlight, initial encounter: Secondary | ICD-10-CM | POA: Diagnosis not present

## 2017-12-23 DIAGNOSIS — L538 Other specified erythematous conditions: Secondary | ICD-10-CM | POA: Diagnosis not present

## 2017-12-23 DIAGNOSIS — L820 Inflamed seborrheic keratosis: Secondary | ICD-10-CM | POA: Diagnosis not present

## 2017-12-23 DIAGNOSIS — R208 Other disturbances of skin sensation: Secondary | ICD-10-CM | POA: Diagnosis not present

## 2017-12-27 ENCOUNTER — Ambulatory Visit: Payer: PPO | Admitting: Family Medicine

## 2017-12-28 ENCOUNTER — Ambulatory Visit
Admission: RE | Admit: 2017-12-28 | Discharge: 2017-12-28 | Disposition: A | Discharge status: Home / Self Care | Payer: PPO | Source: Ambulatory Visit | Attending: Family Medicine | Admitting: Family Medicine

## 2017-12-28 DIAGNOSIS — Z780 Asymptomatic menopausal state: Secondary | ICD-10-CM | POA: Diagnosis not present

## 2017-12-28 DIAGNOSIS — M85851 Other specified disorders of bone density and structure, right thigh: Secondary | ICD-10-CM | POA: Diagnosis not present

## 2017-12-28 DIAGNOSIS — Z1231 Encounter for screening mammogram for malignant neoplasm of breast: Secondary | ICD-10-CM | POA: Diagnosis not present

## 2017-12-28 DIAGNOSIS — E2839 Other primary ovarian failure: Secondary | ICD-10-CM

## 2017-12-29 ENCOUNTER — Ambulatory Visit: Payer: PPO | Admitting: Family Medicine

## 2018-01-20 DIAGNOSIS — H401132 Primary open-angle glaucoma, bilateral, moderate stage: Secondary | ICD-10-CM | POA: Diagnosis not present

## 2018-01-26 DIAGNOSIS — H16143 Punctate keratitis, bilateral: Secondary | ICD-10-CM | POA: Diagnosis not present

## 2018-02-09 DIAGNOSIS — H16143 Punctate keratitis, bilateral: Secondary | ICD-10-CM | POA: Diagnosis not present

## 2018-02-10 ENCOUNTER — Encounter: Payer: Self-pay | Admitting: Neurology

## 2018-02-10 ENCOUNTER — Other Ambulatory Visit: Payer: Self-pay

## 2018-02-10 ENCOUNTER — Ambulatory Visit (INDEPENDENT_AMBULATORY_CARE_PROVIDER_SITE_OTHER): Payer: PPO | Admitting: Neurology

## 2018-02-10 VITALS — BP 120/68 | HR 49 | Resp 16 | Ht 61.81 in | Wt 134.5 lb

## 2018-02-10 DIAGNOSIS — H9319 Tinnitus, unspecified ear: Secondary | ICD-10-CM

## 2018-02-10 DIAGNOSIS — F329 Major depressive disorder, single episode, unspecified: Secondary | ICD-10-CM

## 2018-02-10 DIAGNOSIS — R200 Anesthesia of skin: Secondary | ICD-10-CM

## 2018-02-10 DIAGNOSIS — F419 Anxiety disorder, unspecified: Secondary | ICD-10-CM | POA: Diagnosis not present

## 2018-02-10 DIAGNOSIS — G5602 Carpal tunnel syndrome, left upper limb: Secondary | ICD-10-CM

## 2018-02-10 DIAGNOSIS — G5600 Carpal tunnel syndrome, unspecified upper limb: Secondary | ICD-10-CM | POA: Insufficient documentation

## 2018-02-10 NOTE — Progress Notes (Signed)
[facility]  PATIENT: Maria Sweeney DOB: [birth year]  REFERRING DOCTOR OR PCP:  Reginia Forts SOURCE: patient, records, MRI images and report  _________________________________   HISTORICAL  CHIEF COMPLAINT:  Chief Complaint  Patient presents with  . Tinnitus    Sts. tinnitus is almost completely resolved.  Occ. notices it. Sts. she has mild left sided numbness "when I'm nervous."  Sts. she weaned herself off of the Abilify b/c she wanted to decrease the # of meds she was taking.  Sts. left CTS is worse. She has braces but doesn't wear them/fim  . Decreased Visual Acuity  . Anxiety  . Left Sided Numbness  . Left Carpal Tunnel Syndrome    HISTORY OF PRESENT ILLNESS:  Maria Sweeney Is a 67 y.o. woman with left-sided numbness me anxiety and tinnitus and blurry vision.  Update 02/10/2018: Her tinnitus is doing better.  However, she notes vertigo when she lays down to bed and when she gets back up.    Anxiety has also done better and she was able to wean herself off the Abilify without difficulty earlier this year.      She has pain ans tingling in her left wrist occurring mostly at night and sometimes when using the phone or doing other tasks.    She has not noted weakness.      She also has numbness from the hip down in her left leg but the entire left side goes to sleep at times, worse with sitting.   She notes some neck stiffness more than pain.    She had right Carpal tunnel release in the 1980's.    MRI of the cervical spine 04/24/2016 showed multilevel degenerative changes, worse at C5-C6 where she had mild spinal stenosis.  There was multilevel foraminal narrowing.  Many of her symptoms started or worsened after discontinuing clonazepam.      MRI brain 2017 showed multiple T2/FLAIR hyperintense foci predominantly in the subcortical and deep white matter most consistent with chronic microvascular ischemic change than with demyelination.    She used to take bASA  and still takes Fish Oil.        Update 02/09/2017:      The addition of Abilify has helped her anxiety and agitation.   She tolerates it well.   She continues on Prozac.    She feels that the left sided numbness has almost completely resolved and she just get intermittent numbness in the left arm but none of the face. Of note, she has had carpal tunnel surgery on the right and believes some of her current symptoms in the left arm or similar to the CTS symptoms she experienced in the past. She denies any weakness in the left hand. She notes mild neck pain at time but this is better than it was in the past. She still notes some tinnitus though this has improved. It is no longer troublesome to her.   ____________________________ From 06/25/2016  She continues to have numbness in the left face, arm and leg. Numbness does not increase or decrease with position or other activities.  At times, she notes more numbness than other times. She feels it is better than it was at the last visit. Symptoms started after she tapered herself off of clonazepam after being on it for 22 years.     She continues to have reduced taste and smell.   She passed a smell test when she saw ENT.  Symptoms started after she stopped  the clonazepam.     Tinnitus is better but she still notes it when surroundings are quiet.  She still has some anxiety. She would like to remain off of benzodiazepines if possible.  She was already on Prozac 40 mg when she tapered the clonazepam.  BuSpar was added at the last visit but had some tolerability issues with it and stopped. She has never been on Abilify.   The MRI of the brain 04/24/2016 shows T2/FLAIR hyperintense foci in the pons in the hemispheres. This is likely due to chronic microvascular ischemic change the MRI of the cervical spine shows spinal stenosis at C5-C6 and degenerative changes at C3-C4, C4-C5 and C6-C7 there is multilevel foraminal narrowing. There is no spinal cord  compression.   REVIEW OF SYSTEMS: Constitutional: No fevers, chills, sweats, or change in appetite Eyes: No visual changes, double vision, eye pain Ear, nose and throat: She noted some ringing in her ears. She has had loss of sense of smell and taste. Cardiovascular: No chest pain, palpitations Respiratory: No shortness of breath at rest or with exertion.   No wheezes GastrointestinaI: No nausea, vomiting, diarrhea, abdominal pain, fecal incontinence Genitourinary: No dysuria, urinary retention or frequency.  No nocturia. Musculoskeletal: No neck pain, back pain Integumentary: No rash, pruritus, skin lesions Neurological: as above Psychiatric: No depression at this time.  Notes anxiety Endocrine: No palpitations, diaphoresis, change in appetite, change in weigh or increased thirst Hematologic/Lymphatic: No anemia, purpura, petechiae. Allergic/Immunologic: No itchy/runny eyes, nasal congestion, recent allergic reactions, rashes  ALLERGIES: Allergies  Allergen Reactions  . Amoxicillin   . Augmentin [Amoxicillin-Pot Clavulanate]   . Codeine   . Depakote [Divalproex Sodium]   . Latex   . Lipitor [Atorvastatin]   . Penicillins   . Septra [Sulfamethoxazole-Trimethoprim]   . Zocor [Simvastatin]     HOME MEDICATIONS:  Current Outpatient Medications:  .  albuterol (PROVENTIL HFA;VENTOLIN HFA) 108 (90 Base) MCG/ACT inhaler, Inhale 2 puffs into the lungs every 4 (four) hours as needed for wheezing or shortness of breath (cough, shortness of breath or wheezing.)., Disp: 1 Inhaler, Rfl: 1 .  FLUoxetine (PROZAC) 20 MG capsule, Take 2 capsules (40 mg total) by mouth daily., Disp: 180 capsule, Rfl: 1 .  metoprolol succinate (TOPROL-XL) 25 MG 24 hr tablet, Take 1 tablet (25 mg total) by mouth daily., Disp: 90 tablet, Rfl: 3 .  pantoprazole (PROTONIX) 40 MG tablet, Take 1 tablet (40 mg total) by mouth daily., Disp: 90 tablet, Rfl: 3 .  pravastatin (PRAVACHOL) 40 MG tablet, Take 1 tablet (40  mg total) by mouth daily., Disp: 90 tablet, Rfl: 3 .  ranitidine (ZANTAC) 150 MG tablet, Take 1 tablet (150 mg total) by mouth at bedtime., Disp: 90 tablet, Rfl: 1 .  spironolactone (ALDACTONE) 50 MG tablet, Take 1 tablet (50 mg total) by mouth daily., Disp: 90 tablet, Rfl: 3 .  valACYclovir (VALTREX) 1000 MG tablet, Take 1 tablet (1,000 mg total) by mouth 2 (two) times daily., Disp: 30 tablet, Rfl: 1 .  methocarbamol (ROBAXIN) 500 MG tablet, TAKE 1 TO 2 TABLETS BY MOUTH AT BEDTIME (Patient not taking: Reported on 02/10/2018), Disp: 60 tablet, Rfl: 0  PAST MEDICAL HISTORY: Past Medical History:  Diagnosis Date  . Anxiety   . Cervical spondylosis   . Depression   . GERD (gastroesophageal reflux disease)   . Hyperlipidemia   . Hypertension   . IBS (irritable bowel syndrome)    Constipation with diarrhea.  . Oral herpes  PAST SURGICAL HISTORY: Past Surgical History:  Procedure Laterality Date  . ABDOMINAL HYSTERECTOMY  1978   DUB; ovaries intact.  . APPENDECTOMY  1988  . BREAST BIOPSY Bilateral 2009   Calcium Deposit- 1980's and also in 2009/ Right side 1995  . BREAST EXCISIONAL BIOPSY Bilateral   . COLONOSCOPY  05/11/2009   Normal.  Previous colonoscopy with polyps. [facility].    . DEBRIDEMENT TENNIS ELBOW    . ROTATOR CUFF REPAIR      FAMILY HISTORY: Family History  Problem Relation Age of Onset  . Hyperlipidemia Mother   . Hypertension Mother   . Cancer Mother        lung  . Diabetes Mother   . Glaucoma Mother   . Stroke Mother 27  . Heart disease Mother   . Asthma Father   . Emphysema Father   . COPD Father   . Glaucoma Father   . Hypertension Sister   . Breast cancer Neg Hx     SOCIAL HISTORY:  Social History   Socioeconomic History  . Marital status: Divorced    Spouse name: Not on file  . Number of children: 2  . Years of education: Bachelors  . Highest education level: Not on file  Occupational History  . Occupation: Chemical engineer:  [employer]    Comment: also works at [employer]  Social Needs  . Financial resource strain: Not hard at all  . Food insecurity:    Worry: Never true    Inability: Never true  . Transportation needs:    Medical: No    Non-medical: No  Tobacco Use  . Smoking status: Never Smoker  . Smokeless tobacco: Never Used  Substance and Sexual Activity  . Alcohol use: No    Frequency: Never  . Drug use: No  . Sexual activity: Yes    Partners: Male    Birth control/protection: Surgical, Post-menopausal  Lifestyle  . Physical activity:    Days per week: 0 days    Minutes per session: 0 min  . Stress: To some extent  Relationships  . Social connections:    Talks on phone: Never    Gets together: Never    Attends religious service: More than 4 times per year    Active member of club or organization: No    Attends meetings of clubs or organizations: Never    Relationship status: Divorced  . Intimate partner violence:    Fear of current or ex partner: No    Emotionally abused: No    Physically abused: No    Forced sexual activity: No  Other Topics Concern  . Not on file  Social History Narrative   Marital status: divorced since 1999; dating seriously in 2019 since 2015.      Children: one son, one daughter; 2 grandchildren.      Lives: alone in house with 2 dogs     Employment:  Retired.  Needs to work in 2019.      Tobacco: none      Alcohol: rare      Drugs: none      Exercise:  Sporadic   Right-handed.   Occasional caffeine use.      ADLs: independent with ADLs; drives; no assistant devices.      Advanced Directives: none; FULL CODE.     PHYSICAL EXAM  Vitals:   02/10/18 1136  BP: 120/68  Pulse: (!) 49  Resp: 16  Weight: 134 lb 8  oz (61 kg)  Height: 5' 1.81" (1.57 m)    Body mass index is 24.75 kg/m.   General: The patient is well-developed and well-nourished and in no acute distress.       Neurologic Exam  Mental status: The patient is alert and oriented x 3 at  the time of the examination. The patient has apparent normal recent and remote memory, with an apparently normal attention span and concentration ability.   Speech is normal.  Cranial nerves: Extraocular movements are full.  Facial strength and sensation was normal.  Trapezius strength was normal.    Motor:  Muscle bulk is normal.   Tone is normal. Strength is  5 / 5 in all 4 extremities except 4+/5 left APB  Sensory:   She has Tinel signs over the left wrist and both elbows.  She has a Phalen sign going to the left.  She had symmetric sensation to touch and temperature and vibration in the arms and legs , Coordination: Cerebellar testing reveals good finger-nose-finger and heel-to-shin bilaterally.  Gait and station: Station is normal.   Gait is normal.  Tandem gait is mildly wide.  The Romberg is negative.  Reflexes: Deep tendon reflexes are symmetric and normal bilaterally.   Plantar responses are flexor.  Other:   She has a Tinel's sign a the left wrist.        DIAGNOSTIC DATA (LABS, IMAGING, TESTING) - I reviewed patient records, labs, notes, testing and imaging myself where available.  Lab Results  Component Value Date   WBC 9.3 06/18/2017   HGB 14.7 06/18/2017   HCT 43.6 06/18/2017   MCV 95 06/18/2017   PLT 292 06/18/2017      Component Value Date/Time   NA 144 06/18/2017 1010   K 4.9 06/18/2017 1010   CL 104 06/18/2017 1010   CO2 18 (L) 06/18/2017 1010   GLUCOSE 110 (H) 06/18/2017 1010   GLUCOSE 103 (H) 03/27/2016 1110   BUN 18 06/18/2017 1010   CREATININE 1.09 (H) 06/18/2017 1010   CREATININE 0.89 03/27/2016 1110   CALCIUM 10.3 06/18/2017 1010   PROT 7.8 06/18/2017 1010   ALBUMIN 5.0 (H) 06/18/2017 1010   AST 23 06/18/2017 1010   ALT 30 06/18/2017 1010   ALKPHOS 96 06/18/2017 1010   BILITOT 0.7 06/18/2017 1010   GFRNONAA 53 (L) 06/18/2017 1010   GFRNONAA 86 10/18/2013 1528   GFRAA 61 06/18/2017 1010   GFRAA >89 10/18/2013 1528   Lab Results  Component  Value Date   CHOL 219 (H) 06/18/2017   HDL 53 06/18/2017   LDLCALC 100 (H) 06/18/2017   TRIG 331 (H) 06/18/2017   CHOLHDL 4.1 06/18/2017   Lab Results  Component Value Date   HGBA1C 5.6 02/11/2016   No results found for: VITAMINB12 Lab Results  Component Value Date   TSH 2.840 06/30/2017       ASSESSMENT AND PLAN  Numbness  Carpal tunnel syndrome of left wrist  Anxiety and depression  Tinnitus, unspecified laterality  1.  Continue Prozac 40 mg 2.   Check a nerve conduction study/electromyogram to determine aggressiveness of the carpal tunnel syndrome and whether there is a superimposed radiculopathy or other neuropathy. 3.   Continue to exercise and remain active. 4.    I will see her during the EMG.  She should call and return as needed if there are new or worsening neurologic symptoms.     Zahid Carneiro A. Felecia Shelling, MD, PhD 25/08/2704, 23:76 PM Certified in Neurology,  Clinical Neurophysiology, Sleep Medicine, Pain Medicine and Neuroimaging  [facility] [address], [place], [place] [address] [phone]

## 2018-03-29 ENCOUNTER — Encounter (INDEPENDENT_AMBULATORY_CARE_PROVIDER_SITE_OTHER): Payer: PPO | Admitting: Neurology

## 2018-03-29 ENCOUNTER — Ambulatory Visit (INDEPENDENT_AMBULATORY_CARE_PROVIDER_SITE_OTHER): Payer: PPO | Admitting: Neurology

## 2018-03-29 DIAGNOSIS — Z0289 Encounter for other administrative examinations: Secondary | ICD-10-CM

## 2018-03-29 DIAGNOSIS — G5602 Carpal tunnel syndrome, left upper limb: Secondary | ICD-10-CM | POA: Diagnosis not present

## 2018-03-29 NOTE — Progress Notes (Signed)
Full Name: Maria Sweeney Gender: Female MRN #: [id number] Date of Birth: February 24, 1951    Visit Date: 03/29/2018 13:39 Age: 67 Years 79 Months Old Examining Physician: Arlice Colt, MD  Referring Physician: Felecia Shelling, MD    History:  Maria Sweeney is a 67 year old woman with left greater than right hand numbness and some numbness in the left leg.  She has a history of carpal tunnel release on the right in the past.   On exam, she has a positive Tinel sign on the left wrist.     Nerve conduction studies:    Bilateral median sensory responses have mildly prolonged peak latencies across the wrist with normal amplitudes.  The median motor responses had normal distal latencies, amplitudes and conduction velocities.  Bilateral ulnar motor and sensory responses were normal.  Needle EMG:    Electromyography of selected muscles of the left arm was performed.  Motor unit morphology and recruitment was normal in all of the muscles tested including the hand muscles.  There was no abnormal spontaneous activity.  Impression: This NCV/EMG study shows the following: 1.    Mild bilateral median neuropathies at the wrists (carpal tunnel syndromes). 2.    There is no evidence of superimposed polyneuropathy or radiculopathy.  Richard A. Felecia Shelling, MD, PhD, FAAN Certified in Neurology, Clinical Neurophysiology, Sleep Medicine, Pain Medicine and Neuroimaging Director, [place] [facility] [address], [place], [place] [address] [phone]         [facility]    Nerve / Sites Muscle Latency Ref. Amplitude Ref. Rel Amp Segments Distance Velocity Ref. Area    ms ms mV mV %  cm m/s m/s mVms  R Median - APB     Wrist APB 3.8 ?4.4 5.5 ?4.0 100 Wrist - APB 7   13.4     Upper arm APB 7.3  5.7  104 Upper arm - Wrist 20 56 ?49 14.9  L Median - APB     Wrist APB 4.2 ?4.4 6.7 ?4.0 100 Wrist - APB 7   17.6     Upper arm APB 7.6  5.9   87.9 Upper arm - Wrist 19 56 ?49 16.8  R Ulnar - ADM     Wrist ADM 2.5 ?3.3 9.5 ?6.0 100 Wrist - ADM 7   28.4     B.Elbow ADM 5.4  9.0  95 B.Elbow - Wrist 17 58 ?49 28.1     A.Elbow ADM 7.0  9.2  103 A.Elbow - B.Elbow 10 62 ?49 29.2         A.Elbow - Wrist      L Ulnar - ADM     Wrist ADM 2.4 ?3.3 6.7 ?6.0 100 Wrist - ADM 7   22.2     B.Elbow ADM 5.0  6.2  92.7 B.Elbow - Wrist 16 61 ?49 22.2     A.Elbow ADM 6.9  5.9  95.7 A.Elbow - B.Elbow 10 53 ?49 22.1         A.Elbow - Wrist                 SNC    Nerve / Sites Rec. Site Peak Lat Ref.  Amp Ref. Segments Distance    ms ms V V  cm  R Median - Orthodromic (Dig II, Mid palm)     Dig II Wrist 3.5 ?3.4 20 ?10 Dig II - Wrist 13  L Median -  Orthodromic (Dig II, Mid palm)     Dig II Wrist 3.6 ?3.4 13 ?10 Dig II - Wrist 13  R Ulnar - Orthodromic, (Dig V, Mid palm)     Dig V Wrist 2.7 ?3.1 13 ?5 Dig V - Wrist 11  L Ulnar - Orthodromic, (Dig V, Mid palm)     Dig V Wrist 2.6 ?3.1 8 ?5 Dig V - Wrist 35              F  Wave    Nerve F Lat Ref.   ms ms  R Ulnar - ADM 24.1 ?32.0  L Ulnar - ADM 24.0 ?32.0         EMG full       EMG Summary Table    Spontaneous MUAP Recruitment  Muscle IA Fib PSW Fasc Other Amp Dur. Poly Pattern  L. Deltoid Normal None None None _______ Normal Normal Normal Normal  L. Triceps brachii Normal None None None _______ Normal Normal Normal Normal  L. Biceps brachii Normal None None None _______ Normal Normal Normal Normal  L. First dorsal interosseous Normal None None None _______ Normal Normal Normal Normal  L. Abductor pollicis brevis Normal None None None _______ Normal Normal Normal Normal

## 2018-07-01 DIAGNOSIS — H401132 Primary open-angle glaucoma, bilateral, moderate stage: Secondary | ICD-10-CM | POA: Diagnosis not present

## 2018-07-08 IMAGING — DX DG CERVICAL SPINE COMPLETE 4+V
5 series · 5 of 5 positions shown · non-contrast
Comparison: 09/06/2015

CLINICAL DATA: Chronic cervical spine pain

EXAM:
CERVICAL SPINE - COMPLETE 4+ VIEW

[c-spine lat]
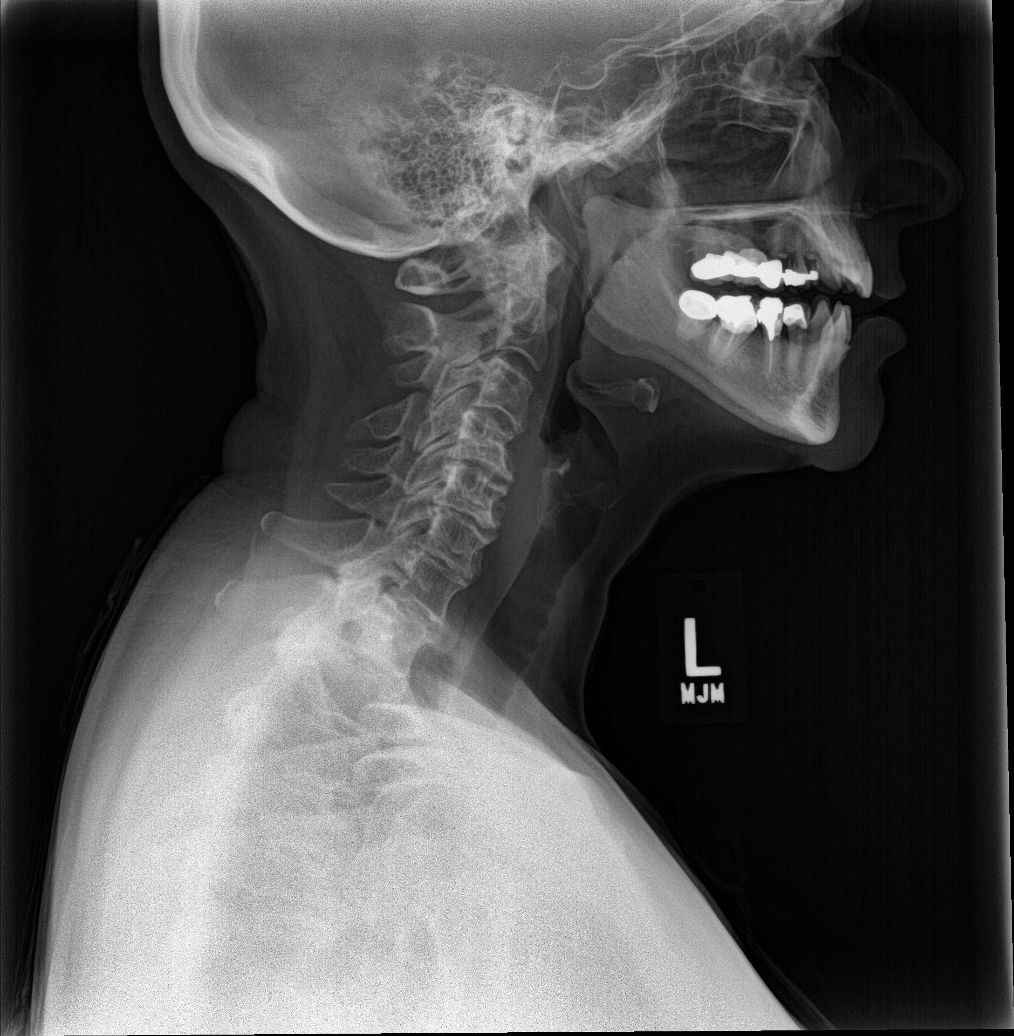

[c-spine obl (1 of 2)]
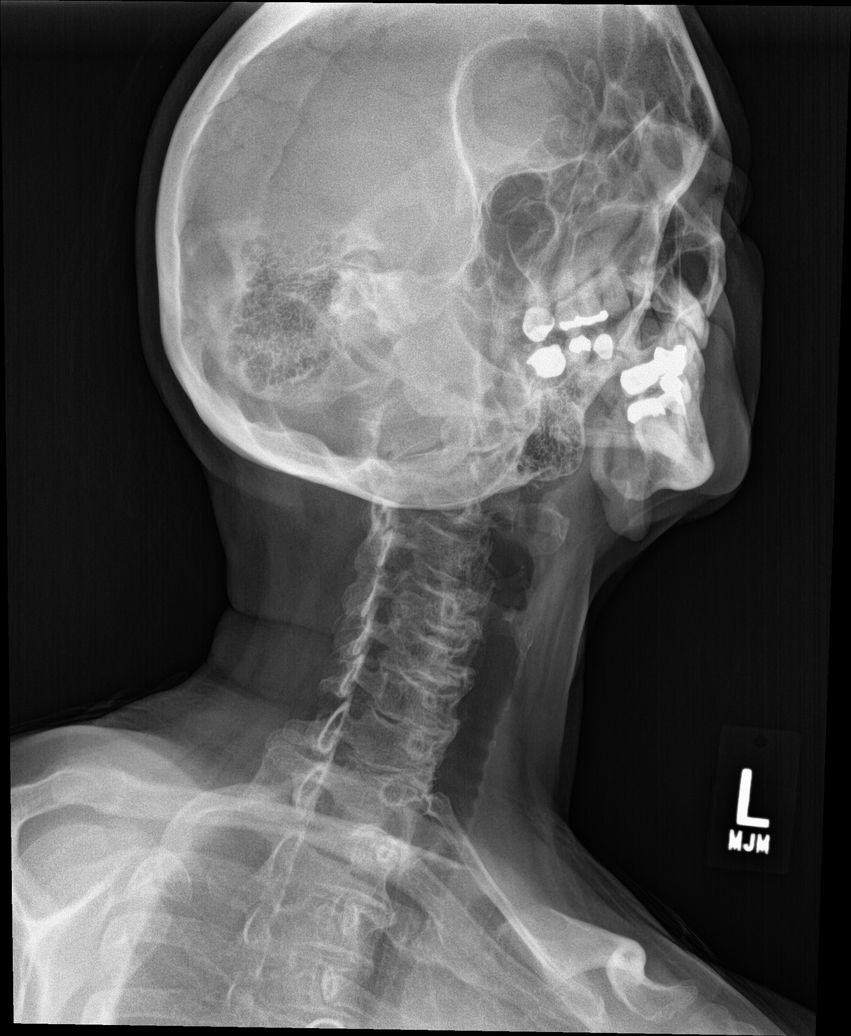

[c-spine obl (2 of 2)]
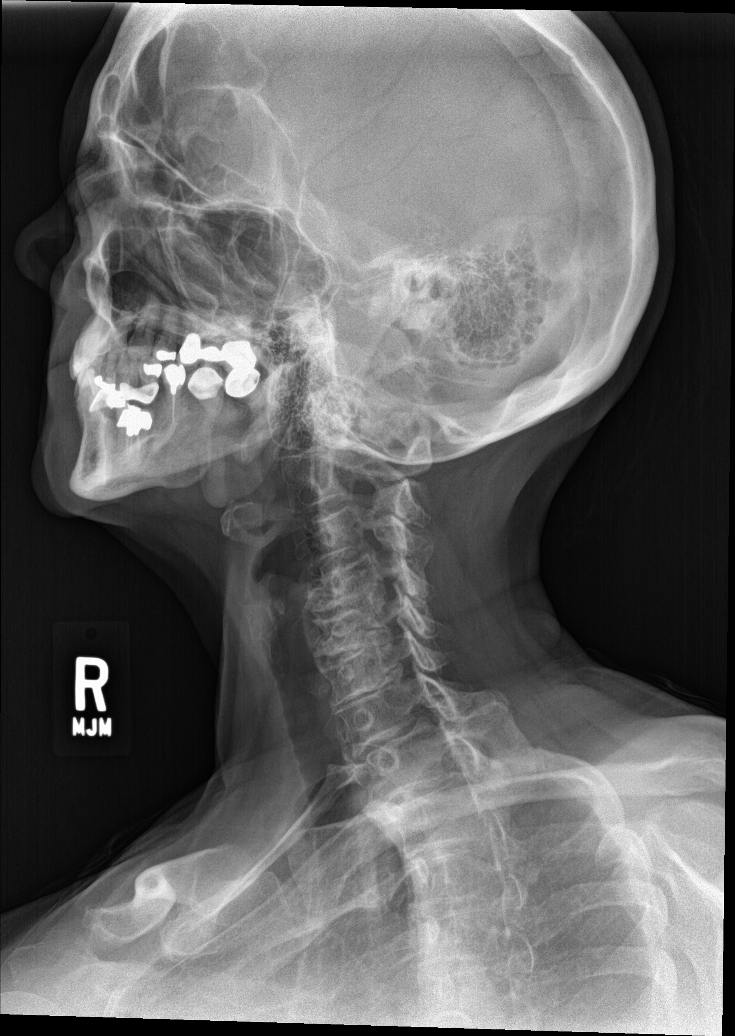

[c-spine ap]
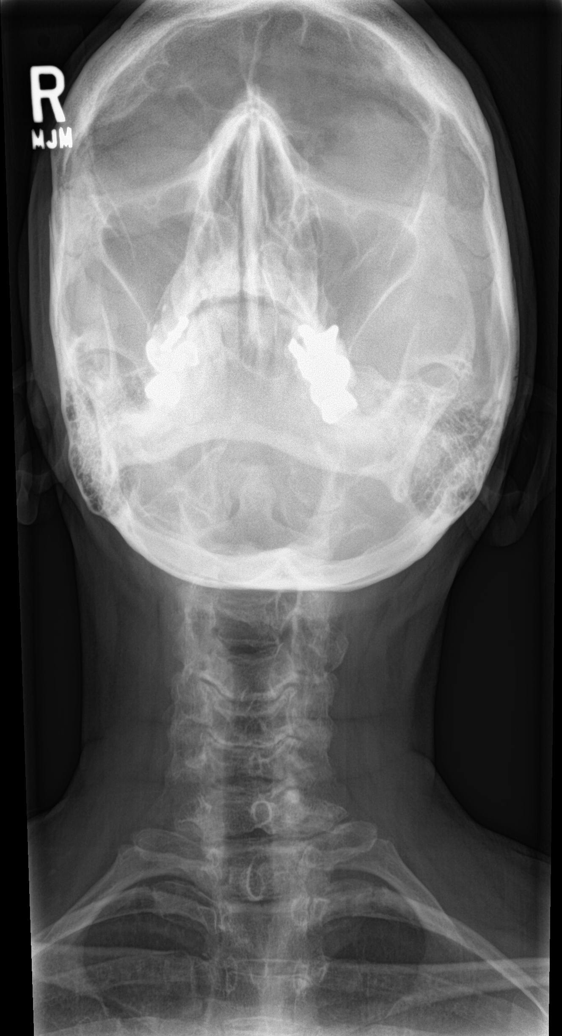

[c-spine open mouth]
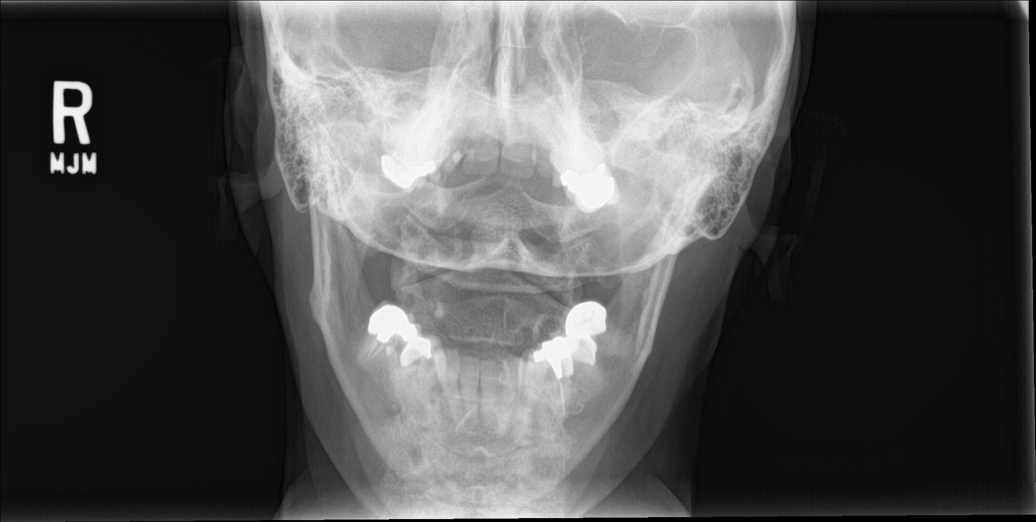

[5 of 5 positions shown; findings below may reference images not displayed]

FINDINGS: Normal alignment of the cervical spine. The vertebral body heights
are well preserved. There is multi level disc space narrowing and
ventral endplate spurring. This is most advanced at C4-5 and C5-6.
Bilateral neuroforaminal narrowing is identified. On the left this
is most advanced at C4-5. On the right this is most advanced at C3-4
C4-5 and C5-6.
IMPRESSION: 1. Advanced cervical spondylosis with bilateral neuroforaminal
narrowing.

## 2018-07-28 ENCOUNTER — Telehealth: Payer: Self-pay | Admitting: Family Medicine

## 2018-07-28 NOTE — Telephone Encounter (Signed)
Copied from [place] [phone]. Topic: General - Other >> Jul 28, 2018  1:45 PM Mcneil, Ja-Kwan wrote: Reason for CRM: Pt stated her pharmacy is submitting a refill request on 3 of her medications and she would like to ask that the request be approved because she does not have enough medication to last until her until the appt on 08/02/18.

## 2018-07-28 NOTE — Telephone Encounter (Signed)
Pt has not been seen in over 1 year and her provider is no longer at [place].  Pt needs to schedule an appointment for follow up-transfer of care since she was a pt of Dr. Thompson Caul. Pt was aware Dr. Tamala Julian was leaving and was asked to make a follow up appointment. We are not able to refill her medication until she is seen at [facility]

## 2018-07-30 ENCOUNTER — Other Ambulatory Visit: Payer: Self-pay

## 2018-07-30 ENCOUNTER — Other Ambulatory Visit: Payer: Self-pay | Admitting: Family Medicine

## 2018-07-30 ENCOUNTER — Ambulatory Visit (INDEPENDENT_AMBULATORY_CARE_PROVIDER_SITE_OTHER): Payer: PPO | Admitting: Family Medicine

## 2018-07-30 DIAGNOSIS — E559 Vitamin D deficiency, unspecified: Secondary | ICD-10-CM

## 2018-07-30 DIAGNOSIS — E7800 Pure hypercholesterolemia, unspecified: Secondary | ICD-10-CM

## 2018-07-30 DIAGNOSIS — I10 Essential (primary) hypertension: Secondary | ICD-10-CM

## 2018-07-30 NOTE — Progress Notes (Signed)
Nurse visit for lab only visit.

## 2018-08-01 LAB — COMPREHENSIVE METABOLIC PANEL
ALT: 32 IU/L (ref 0–32)
AST: 27 IU/L (ref 0–40)
Albumin/Globulin Ratio: 2.2 (ref 1.2–2.2)
Albumin: 4.7 g/dL (ref 3.8–4.8)
Alkaline Phosphatase: 96 IU/L (ref 39–117)
BUN/Creatinine Ratio: 19 (ref 12–28)
BUN: 17 mg/dL (ref 8–27)
Bilirubin Total: 0.6 mg/dL (ref 0.0–1.2)
CO2: 22 mmol/L (ref 20–29)
Calcium: 9.8 mg/dL (ref 8.7–10.3)
Chloride: 103 mmol/L (ref 96–106)
Creatinine, Ser: 0.9 mg/dL (ref 0.57–1.00)
GFR calc Af Amer: 76 mL/min/{1.73_m2} (ref >59)
GFR calc non Af Amer: 66 mL/min/{1.73_m2} (ref >59)
Globulin, Total: 2.1 g/dL (ref 1.5–4.5)
Glucose: 106 mg/dL — ABNORMAL HIGH (ref 65–99)
Potassium: 4.9 mmol/L (ref 3.5–5.2)
Sodium: 142 mmol/L (ref 134–144)
Total Protein: 6.8 g/dL (ref 6.0–8.5)

## 2018-08-01 LAB — LIPID PANEL
Chol/HDL Ratio: 4.2 ratio (ref 0.0–4.4)
Cholesterol, Total: 205 mg/dL — ABNORMAL HIGH (ref 100–199)
HDL: 49 mg/dL (ref >39)
LDL Calculated: 117 mg/dL — ABNORMAL HIGH (ref 0–99)
Triglycerides: 196 mg/dL — ABNORMAL HIGH (ref 0–149)
VLDL Cholesterol Cal: 39 mg/dL (ref 5–40)

## 2018-08-01 LAB — VITAMIN D 25 HYDROXY (VIT D DEFICIENCY, FRACTURES): Vit D, 25-Hydroxy: 16 ng/mL — ABNORMAL LOW (ref 30.0–100.0)

## 2018-08-02 ENCOUNTER — Telehealth (INDEPENDENT_AMBULATORY_CARE_PROVIDER_SITE_OTHER): Payer: PPO | Admitting: Family Medicine

## 2018-08-02 ENCOUNTER — Other Ambulatory Visit: Payer: Self-pay

## 2018-08-02 DIAGNOSIS — E559 Vitamin D deficiency, unspecified: Secondary | ICD-10-CM | POA: Insufficient documentation

## 2018-08-02 DIAGNOSIS — F329 Major depressive disorder, single episode, unspecified: Secondary | ICD-10-CM | POA: Diagnosis not present

## 2018-08-02 DIAGNOSIS — I10 Essential (primary) hypertension: Secondary | ICD-10-CM | POA: Diagnosis not present

## 2018-08-02 DIAGNOSIS — F419 Anxiety disorder, unspecified: Secondary | ICD-10-CM | POA: Diagnosis not present

## 2018-08-02 DIAGNOSIS — R7303 Prediabetes: Secondary | ICD-10-CM | POA: Diagnosis not present

## 2018-08-02 DIAGNOSIS — K219 Gastro-esophageal reflux disease without esophagitis: Secondary | ICD-10-CM | POA: Diagnosis not present

## 2018-08-02 DIAGNOSIS — E7800 Pure hypercholesterolemia, unspecified: Secondary | ICD-10-CM | POA: Diagnosis not present

## 2018-08-02 MED ORDER — FAMOTIDINE 20 MG PO TABS: 20.0000 mg | ORAL_TABLET | Freq: Every day | ORAL | 1 refills | Status: DC

## 2018-08-02 MED ORDER — SPIRONOLACTONE 50 MG PO TABS: 50.0000 mg | ORAL_TABLET | Freq: Every day | ORAL | 3 refills | Status: DC

## 2018-08-02 MED ORDER — PANTOPRAZOLE SODIUM 40 MG PO TBEC: 40.0000 mg | DELAYED_RELEASE_TABLET | Freq: Every day | ORAL | 3 refills | Status: DC

## 2018-08-02 MED ORDER — METOPROLOL SUCCINATE ER 25 MG PO TB24: 25.0000 mg | ORAL_TABLET | Freq: Every day | ORAL | 3 refills | Status: DC

## 2018-08-02 MED ORDER — FLUOXETINE HCL 20 MG PO CAPS: 40.0000 mg | ORAL_CAPSULE | Freq: Every day | ORAL | 1 refills | Status: DC

## 2018-08-02 MED ORDER — VITAMIN D (ERGOCALCIFEROL) 1.25 MG (50000 UNIT) PO CAPS: 50000.0000 [IU] | ORAL_CAPSULE | ORAL | 0 refills | Status: DC

## 2018-08-02 MED ORDER — ROSUVASTATIN CALCIUM 10 MG PO TABS: 10.0000 mg | ORAL_TABLET | Freq: Every day | ORAL | 3 refills | Status: DC

## 2018-08-02 NOTE — Progress Notes (Signed)
Virtual Visit via telephone Note  I connected with patient on 08/02/18 at 847am by telephone and verified that I am speaking with the correct person using two identifiers. Maria Sweeney is currently located at home and patient is currently with her during visit. The provider, Rutherford Guys, MD is located in their office at time of visit.  I discussed the limitations, risks, security and privacy concerns of performing an evaluation and management service by telephone and the availability of in person appointments. I also discussed with the patient that there may be a patient responsible charge related to this service. The patient expressed understanding and agreed to proceed.  Telephone visit today for routine followup  HPI ? Last OV Feb 2019, CPE with Dr Camillia Herter out of prozac and spironolactone a week ago Checks BP at home occassionally 140/84, 134/67 was most recent reading, wo spironolactone Has not gone thru withdrawals from being off prozac Mood overall has been stable on prozac on 40mg  Has been on prozac for both depression and anxiety She does not get out of home much Needs alternative to ranitidine as it has been recalled, takes every night also takes protonix daily, since then has had increase in reflux and heartburn LDL 117, on pravastatin 40mg , in the past atorvastatin and simvastatin caused really bad muscle pain, she has tolerated crestor in the past Since last year, has lost about 10 lbs, diabetes runs in her family Takes valtrex as needed for cold sore, does not need refills at this time   Fall Risk  06/30/2017 06/18/2017 02/10/2017 02/10/2017 08/31/2016  Falls in the past year? Yes Yes Yes No No  Number falls in past yr: 2 or more 2 or more 1 - -  Comment - - - - -  Injury with Fall? No No No - -  Comment - - - - -  Risk for fall due to : - Other (Comment) - - -  Risk for fall due to: Comment - slipped in bathtub, dog tripped her - - -  Follow up - Falls prevention  discussed - - -     Depression screen [facility] 2/9 06/30/2017 06/18/2017 02/10/2017  Decreased Interest 3 3 0  Down, Depressed, Hopeless 1 1 0  PHQ - 2 Score 4 4 0  Altered sleeping 3 3 -  Tired, decreased energy 1 0 -  Change in appetite 1 0 -  Feeling bad or failure about yourself  1 1 -  Trouble concentrating 1 3 -  Moving slowly or fidgety/restless 0 0 -  Suicidal thoughts 0 0 -  PHQ-9 Score 11 11 -  Difficult doing work/chores - Not difficult at all -    Allergies  Allergen Reactions  . Amoxicillin   . Augmentin [Amoxicillin-Pot Clavulanate]   . Codeine   . Depakote [Divalproex Sodium]   . Latex   . Lipitor [Atorvastatin]   . Penicillins   . Septra [Sulfamethoxazole-Trimethoprim]   . Zocor [Simvastatin]     Prior to Admission medications   Medication Sig Start Date End Date Taking? Authorizing Provider  albuterol (PROVENTIL HFA;VENTOLIN HFA) 108 (90 Base) MCG/ACT inhaler Inhale 2 puffs into the lungs every 4 (four) hours as needed for wheezing or shortness of breath (cough, shortness of breath or wheezing.). 02/11/16   Wardell Honour, MD  FLUoxetine (PROZAC) 20 MG capsule Take 2 capsules (40 mg total) by mouth daily. 06/30/17   Wardell Honour, MD  methocarbamol (ROBAXIN) 500 MG tablet TAKE  1 TO 2 TABLETS BY MOUTH AT BEDTIME Patient not taking: Reported on 02/10/2018 10/21/17   Wardell Honour, MD  metoprolol succinate (TOPROL-XL) 25 MG 24 hr tablet Take 1 tablet (25 mg total) by mouth daily. 06/30/17   Wardell Honour, MD  pantoprazole (PROTONIX) 40 MG tablet Take 1 tablet (40 mg total) by mouth daily. 06/30/17   Wardell Honour, MD  pravastatin (PRAVACHOL) 40 MG tablet Take 1 tablet (40 mg total) by mouth daily. 06/30/17   Wardell Honour, MD  ranitidine (ZANTAC) 150 MG tablet Take 1 tablet (150 mg total) by mouth at bedtime. 06/30/17   Wardell Honour, MD  spironolactone (ALDACTONE) 50 MG tablet Take 1 tablet (50 mg total) by mouth daily. 06/30/17   Wardell Honour, MD  valACYclovir  (VALTREX) 1000 MG tablet Take 1 tablet (1,000 mg total) by mouth 2 (two) times daily. 06/30/17   Wardell Honour, MD    Past Medical History:  Diagnosis Date  . Anxiety   . Cervical spondylosis   . Depression   . GERD (gastroesophageal reflux disease)   . Hyperlipidemia   . Hypertension   . IBS (irritable bowel syndrome)    Constipation with diarrhea.  . Oral herpes     Past Surgical History:  Procedure Laterality Date  . ABDOMINAL HYSTERECTOMY  1978   DUB; ovaries intact.  . APPENDECTOMY  1988  . BREAST BIOPSY Bilateral 2009   Calcium Deposit- 1980's and also in 2009/ Right side 1995  . BREAST EXCISIONAL BIOPSY Bilateral   . COLONOSCOPY  05/11/2009   Normal.  Previous colonoscopy with polyps. [facility].    . DEBRIDEMENT TENNIS ELBOW    . ROTATOR CUFF REPAIR      Social History   Tobacco Use  . Smoking status: Never Smoker  . Smokeless tobacco: Never Used  Substance Use Topics  . Alcohol use: No    Frequency: Never    Family History  Problem Relation Age of Onset  . Hyperlipidemia Mother   . Hypertension Mother   . Cancer Mother        lung  . Diabetes Mother   . Glaucoma Mother   . Stroke Mother 9  . Heart disease Mother   . Asthma Father   . Emphysema Father   . COPD Father   . Glaucoma Father   . Hypertension Sister   . Breast cancer Neg Hx     Review of Systems  Constitutional: Negative for chills and fever.  Respiratory: Negative for cough and shortness of breath.   Cardiovascular: Negative for chest pain, palpitations and leg swelling.  Gastrointestinal: Negative for abdominal pain, nausea and vomiting.    Objective  Vitals as reported by the patient: 140/84, 134/67  Wt Readings from Last 3 Encounters:  02/10/18 134 lb 8 oz (61 kg)  06/30/17 145 lb (65.8 kg)  06/18/17 142 lb 4 oz (64.5 kg)   BP Readings from Last 3 Encounters:  02/10/18 120/68  06/30/17 138/70  06/18/17 116/70    ASSESSMENT and PLAN  1. Anxiety and depression  Patient has been stable on current regime. Discussed restarting with titration.  - FLUoxetine (PROZAC) 20 MG capsule; Take 2 capsules (40 mg total) by mouth daily.  2. Pure hypercholesterolemia Above goal, changing to crestor. Recheck fasting lipids in 3 months  3. Essential hypertension, benign Per home readings at goal, cont current regime, meds refilled  4. Gastroesophageal reflux disease without esophagitis Controlled when on PPI and H2B.  Changing ranitidine to famotidine given recall  5. Vitamin D deficiency New diagnosis. Starting high dose vitamin d. Recheck labs in 3 months.  6. Prediabetes History of recurring elevated fasting glucose. Discussed LFM. Check a1c prior to next OV  Other orders - spironolactone (ALDACTONE) 50 MG tablet; Take 1 tablet (50 mg total) by mouth daily. - rosuvastatin (CRESTOR) 10 MG tablet; Take 1 tablet (10 mg total) by mouth daily. - famotidine (PEPCID) 20 MG tablet; Take 1 tablet (20 mg total) by mouth at bedtime. - pantoprazole (PROTONIX) 40 MG tablet; Take 1 tablet (40 mg total) by mouth daily. - metoprolol succinate (TOPROL-XL) 25 MG 24 hr tablet; Take 1 tablet (25 mg total) by mouth daily. - Vitamin D, Ergocalciferol, (DRISDOL) 1.25 MG (50000 UT) CAPS capsule; Take 1 capsule (50,000 Units total) by mouth every 7 (seven) days.  followup in 3 months with fasting labs 2-3 days prior  I discussed the assessment and treatment plan with the patient. The patient was provided an opportunity to ask questions and all were answered. The patient agreed with the plan and demonstrated an understanding of the instructions.   The patient was advised to call back or seek an in-person evaluation if the symptoms worsen or if the condition fails to improve as anticipated.  The above assessment and management plan was discussed with the patient. The patient verbalized understanding of and has agreed to the management plan. Patient is aware to call the clinic if  symptoms persist or worsen. Patient is aware when to return to the clinic for a follow-up visit. Patient educated on when it is appropriate to go to the emergency department.    I provided 19 minutes of non-face-to-face time during this encounter.  Rutherford Guys, MD Primary Care at [place], [place] [address] Ph.  [phone] Fax [phone]

## 2018-08-02 NOTE — Patient Instructions (Signed)
followup in 3 months with fasting labs 2-3 days prior

## 2018-08-29 ENCOUNTER — Telehealth (INDEPENDENT_AMBULATORY_CARE_PROVIDER_SITE_OTHER): Payer: PPO | Admitting: Registered Nurse

## 2018-08-29 ENCOUNTER — Other Ambulatory Visit: Payer: Self-pay | Admitting: Registered Nurse

## 2018-08-29 ENCOUNTER — Encounter: Payer: Self-pay | Admitting: Registered Nurse

## 2018-08-29 DIAGNOSIS — N3000 Acute cystitis without hematuria: Secondary | ICD-10-CM

## 2018-08-29 MED ORDER — NITROFURANTOIN MONOHYD MACRO 100 MG PO CAPS: 100.0000 mg | ORAL_CAPSULE | Freq: Two times a day (BID) | ORAL | 0 refills | Status: AC

## 2018-08-29 NOTE — Progress Notes (Signed)
Telemedicine Encounter- SOAP NOTE Established Patient  This telephone encounter was conducted with the patient's (or proxy's) verbal consent via audio telecommunications: yes Patient was instructed to have this encounter in a suitably private space; and to only have persons present to whom they give permission to participate. In addition, patient identity was confirmed by use of name plus two identifiers (DOB and address).  I discussed the limitations, risks, security and privacy concerns of performing an evaluation and management service by telephone and the availability of in person appointments. I also discussed with the patient that there may be a patient responsible charge related to this service. The patient expressed understanding and agreed to proceed.  I spent a total of 25 talking with the patient or their proxy.  CC: UTI symptoms  Subjective   Maria Sweeney is a pleasant 68 y.o. established patient. Telephone visit today for  Maria Sweeney reports that over the past 4-5 days she has experienced the onset of a number of urinary symptoms that she suspects are related to a UTI. This includes urinary urgency starting on Thursday and Friday, with burning and a fever starting over the weekend. She states that her fever has reached 100.7 degrees F.  She reports an increase in generalized aches with these symptoms. At this time, she denies headaches, SOB, cough, GI symptoms. She reports self-isolation and stay at home for 1+ month. She reports a history of UTIs and that these symptoms are similar to ones she has had in the past.    Patient Active Problem List   Diagnosis Date Noted  . Vitamin D deficiency 08/02/2018  . Prediabetes 08/02/2018  . Carpal tunnel syndrome 02/10/2018  . Tinnitus 06/25/2016  . Numbness 04/13/2016  . Loss of smell 04/13/2016  . Pure hypercholesterolemia 11/30/2014  . Degenerative disc disease, cervical 04/24/2014  . Essential hypertension, benign  10/19/2013  . Insomnia 10/19/2013  . Herpes labialis 10/19/2013  . Anxiety and depression 07/25/2012  . GERD (gastroesophageal reflux disease) 07/25/2012    Past Medical History:  Diagnosis Date  . Anxiety   . Cervical spondylosis   . Depression   . GERD (gastroesophageal reflux disease)   . Hyperlipidemia   . Hypertension   . IBS (irritable bowel syndrome)    Constipation with diarrhea.  . Oral herpes     Current Outpatient Medications  Medication Sig Dispense Refill  . albuterol (PROVENTIL HFA;VENTOLIN HFA) 108 (90 Base) MCG/ACT inhaler Inhale 2 puffs into the lungs every 4 (four) hours as needed for wheezing or shortness of breath (cough, shortness of breath or wheezing.). 1 Inhaler 1  . famotidine (PEPCID) 20 MG tablet Take 1 tablet (20 mg total) by mouth at bedtime. 90 tablet 1  . FLUoxetine (PROZAC) 20 MG capsule Take 2 capsules (40 mg total) by mouth daily. 180 capsule 1  . metoprolol succinate (TOPROL-XL) 25 MG 24 hr tablet Take 1 tablet (25 mg total) by mouth daily. 90 tablet 3  . pantoprazole (PROTONIX) 40 MG tablet Take 1 tablet (40 mg total) by mouth daily. 90 tablet 3  . rosuvastatin (CRESTOR) 10 MG tablet Take 1 tablet (10 mg total) by mouth daily. 90 tablet 3  . spironolactone (ALDACTONE) 50 MG tablet Take 1 tablet (50 mg total) by mouth daily. 90 tablet 3  . valACYclovir (VALTREX) 1000 MG tablet Take 1 tablet (1,000 mg total) by mouth 2 (two) times daily. 30 tablet 1  . Vitamin D, Ergocalciferol, (DRISDOL) 1.25 MG (50000 UT) CAPS  capsule Take 1 capsule (50,000 Units total) by mouth every 7 (seven) days. 12 capsule 0   No current facility-administered medications for this visit.     Allergies  Allergen Reactions  . Amoxicillin   . Augmentin [Amoxicillin-Pot Clavulanate]   . Codeine   . Depakote [Divalproex Sodium]   . Latex   . Lipitor [Atorvastatin]   . Penicillins   . Septra [Sulfamethoxazole-Trimethoprim]   . Zocor [Simvastatin]     Social History    Socioeconomic History  . Marital status: Divorced    Spouse name: Not on file  . Number of children: 2  . Years of education: Bachelors  . Highest education level: Not on file  Occupational History  . Occupation: Chemical engineer: [employer]    Comment: also works at [employer]  Social Needs  . Financial resource strain: Not hard at all  . Food insecurity:    Worry: Never true    Inability: Never true  . Transportation needs:    Medical: No    Non-medical: No  Tobacco Use  . Smoking status: Never Smoker  . Smokeless tobacco: Never Used  Substance and Sexual Activity  . Alcohol use: No    Frequency: Never  . Drug use: No  . Sexual activity: Yes    Partners: Male    Birth control/protection: Surgical, Post-menopausal  Lifestyle  . Physical activity:    Days per week: 0 days    Minutes per session: 0 min  . Stress: To some extent  Relationships  . Social connections:    Talks on phone: Never    Gets together: Never    Attends religious service: More than 4 times per year    Active member of club or organization: No    Attends meetings of clubs or organizations: Never    Relationship status: Divorced  . Intimate partner violence:    Fear of current or ex partner: No    Emotionally abused: No    Physically abused: No    Forced sexual activity: No  Other Topics Concern  . Not on file  Social History Narrative   Marital status: divorced since 1999; dating seriously in 2019 since 2015.      Children: one son, one daughter; 2 grandchildren.      Lives: alone in house with 2 dogs     Employment:  Retired.  Needs to work in 2019.      Tobacco: none      Alcohol: rare      Drugs: none      Exercise:  Sporadic   Right-handed.   Occasional caffeine use.      ADLs: independent with ADLs; drives; no assistant devices.      Advanced Directives: none; FULL CODE.    Review of Systems  Constitutional: Positive for fever and malaise/fatigue.  HENT: Negative for  sore throat.   Respiratory: Negative for cough and shortness of breath.   Cardiovascular: Negative for chest pain.  Gastrointestinal: Positive for diarrhea (within her normal bowel habits). Negative for nausea and vomiting.  Genitourinary: Positive for dysuria, frequency and urgency. Negative for flank pain and hematuria.  Musculoskeletal: Positive for myalgias.    Objective   Vitals as reported by the patient: There were no vitals filed for this visit. This visit took place via telehealth.   Diagnoses and all orders for this visit:  Acute cystitis without hematuria -     Cancel: Urinalysis Dipstick; Future -  Urine Culture; Future -     POCT urinalysis dipstick   PLAN: Orders Placed This Encounter  Procedures  . Urine Culture    Standing Status:   Future    Standing Expiration Date:   09/28/2018  . POCT urinalysis dipstick  Pt will present to clinic when able to leave sample for POCT dipstick and Urine C+S.   Pt rx:  Nitrofurantoin (Macrobid) 100mg  PO bid for 5 days for UTI.   Pt encouraged to:  Push clear liquids, including but not limited to water and cranberry juice.   Wear loose fitting, natural material clothing to bed.  Wash with neutral, non-scented soaps  Wipe from front to back after using the restroom  Empty your bladder following sexual intercourse.   I discussed the assessment and treatment plan with the patient. The patient was provided an opportunity to ask questions and all were answered. The patient agreed with the plan and demonstrated an understanding of the instructions.   The patient was advised to call back or seek an in-person evaluation if the symptoms worsen or if the condition fails to improve as anticipated.  I provided 25 minutes of non-face-to-face time during this encounter.  Maximiano Coss, NP  Primary Care at [facility]

## 2018-08-29 NOTE — Patient Instructions (Signed)
Hi [name] , thank you for taking the time to visit with me. In addition to the prescribed Macrobid, I would encourage you to do the following to assist in UTI relief:   Finish the course of antibiotics, even if you are feeling better.  Push clear liquids, including but not limited to water and cranberry juice.   Wear loose fitting, natural material clothing to bed.  Wash with neutral, non-scented soaps.  Wipe from front to back after using the restroom.  Empty your bladder following sexual intercourse.  Use OTC AZO for relief of dysuria.   If symptoms do not improve by the end of the Macrobid treatment, please call our office at [phone] to discuss further options.   Thank you again,  Thank you for your visit with Primary Care at [facility] today.  Maximiano Coss, NP [facility] Primary Care at [place], [place] [address] [phone] - 0000

## 2018-08-29 NOTE — Progress Notes (Signed)
Spoke with pt this evening and she states she has ben having some dysuria,pelvic pain, urinary frequency, and lower back pain since Friday. She states she noticed a slight fever today 99.9. I asked pt would she be available  to bring in a urine sample and she states she does not have any transportation. Pt states she can barley make it to the restroom without voiding on herself.

## 2018-08-29 NOTE — Progress Notes (Signed)
Pt unable to present to clinic today before telehealth visit. Pt states she will present tomorrow to leave sample for UA dipstick and urine culture.    Maximiano Coss, NP [facility] Primary Care at [place], [place] [address] [phone] - 0000

## 2018-08-30 ENCOUNTER — Other Ambulatory Visit: Payer: Self-pay

## 2018-08-30 ENCOUNTER — Ambulatory Visit (INDEPENDENT_AMBULATORY_CARE_PROVIDER_SITE_OTHER): Payer: PPO | Admitting: Registered Nurse

## 2018-08-30 DIAGNOSIS — N3000 Acute cystitis without hematuria: Secondary | ICD-10-CM | POA: Diagnosis not present

## 2018-08-30 LAB — POCT URINALYSIS DIP (MANUAL ENTRY)
Bilirubin, UA: NEGATIVE
Blood, UA: NEGATIVE
Glucose, UA: NEGATIVE mg/dL
Nitrite, UA: POSITIVE — AB
Protein Ur, POC: 100 mg/dL — AB
Spec Grav, UA: 1.02 (ref 1.010–1.025)
Urobilinogen, UA: 1 E.U./dL
pH, UA: 5.5 (ref 5.0–8.0)

## 2018-08-30 NOTE — Addendum Note (Signed)
Addended by: Dierdre Searles on: 08/30/2018 02:31 PM   Modules accepted: Orders

## 2018-08-31 LAB — URINE CULTURE

## 2018-09-01 ENCOUNTER — Ambulatory Visit: Payer: Self-pay

## 2018-09-01 NOTE — Telephone Encounter (Signed)
Pt c/o fever to 100.0, body aches chills,headache and stated that her chest feels tight when she breathes. Pt stated she is not having any SOB or wheezing.  Pt stated she has been staying in but her boyfriend (who is an Print production planner) has been visiting her. Symptoms started Monday. Pt with h/o HTN. Pt given care advice and pt verbalized understanding. Pt verified e-mail address.  Please review note to see if provider feels pt needs to be scheduled for an appt.  Reason for Disposition . 1] COVID-19 infection diagnosed or suspected AND [2] mild symptoms (fever, cough) AND [2] no trouble breathing or other complications  Answer Assessment - Initial Assessment Questions 1. COVID-19 DIAGNOSIS: "Who made your Coronavirus (COVID-19) diagnosis?" "Was it confirmed by a positive lab test?" If not diagnosed by a HCP, ask "Are there lots of cases (community spread) where you live?" (See public health department website, if unsure)   * MAJOR community spread: high number of cases; numbers of cases are increasing; many people hospitalized.   * MINOR community spread: low number of cases; not increasing; few or no people hospitalized     major 2. ONSET: "When did the COVID-19 symptoms start?"      Monday 3. WORST SYMPTOM: "What is your worst symptom?" (e.g., cough, fever, shortness of breath, muscle aches)     100, fever, coughing last night, tighter than normal not struggling to breathe 4. COUGH: "How bad is the cough?"       Lasted last night  only 5. FEVER: "Do you have a fever?" If so, ask: "What is your temperature, how was it measured, and when did it start?"     100.3- started Monday am with fever 6. RESPIRATORY STATUS: "Describe your breathing?" (e.g., shortness of breath, wheezing, unable to speak)      Chest feels tight and sore back f neck 7. BETTER-SAME-WORSE: "Are you getting better, staying the same or getting worse compared to yesterday?"  If getting worse, ask, "In what way?"     worse 8.  HIGH RISK DISEASE: "Do you have any chronic medical problems?" (e.g., asthma, heart or lung disease, weak immune system, etc.)     HTN 9. PREGNANCY: "Is there any chance you are pregnant?" "When was your last menstrual period?"     n/a 10. OTHER SYMPTOMS: "Do you have any other symptoms?"  (e.g., runny nose, headache, sore throat, loss of smell)      Headache all week- body aches and chills.  Protocols used: CORONAVIRUS (COVID-19) DIAGNOSED OR SUSPECTED-A-AH

## 2018-09-02 NOTE — Telephone Encounter (Signed)
Please contact patient to set up Virtual visit.   Thank you!

## 2018-10-25 ENCOUNTER — Other Ambulatory Visit: Payer: Self-pay | Admitting: *Deleted

## 2018-10-25 ENCOUNTER — Other Ambulatory Visit: Payer: Self-pay

## 2018-10-25 ENCOUNTER — Ambulatory Visit (INDEPENDENT_AMBULATORY_CARE_PROVIDER_SITE_OTHER): Payer: PPO | Admitting: Family Medicine

## 2018-10-25 VITALS — BP 153/83 | Wt 134.0 lb

## 2018-10-25 DIAGNOSIS — Z0000 Encounter for general adult medical examination without abnormal findings: Secondary | ICD-10-CM | POA: Diagnosis not present

## 2018-10-25 DIAGNOSIS — Z1211 Encounter for screening for malignant neoplasm of colon: Secondary | ICD-10-CM

## 2018-10-25 NOTE — Progress Notes (Signed)
Review of Systems:       Presents today for [employer] Visit   Date of last exam: 08/30/2018  Interpreter used for this visit? No  I connected with  Maria Sweeney on 10/25/18 by telephone  and verified that I am speaking with the correct person using two identifiers.   I discussed the limitations of evaluation and management by telemedicine. The patient expressed understanding and agreed to proceed.    Patient Care Team: Patient, No Pcp Per as PCP - General (General Practice)   Other items to address today:   Discussed Immunizations TDAP declined due to insurance Colonoscopy referral placed.  Discussed Eye/Dental exam  Patient sees Ophthalmology for Glaucoma follow up scheduled for August.     Other Screening: Last screening for diabetes:  Last lipid screening: 07/30/2018  ADVANCE DIRECTIVES: Discussed: yes On File: no Materials Provided yes (mailed)  Immunization status:  Immunization History  Administered Date(s) Administered  . Influenza,inj,Quad PF,6+ Mos 02/22/2015, 02/11/2016, 02/10/2017, 02/28/2018  . Pneumococcal Conjugate-13 04/26/2015  . Pneumococcal Polysaccharide-23 05/12/2016     Health Maintenance Due  Topic Date Due  . TETANUS/TDAP  02/13/1969  . COLONOSCOPY  06/19/2018     Functional Status Survey: Is the patient deaf or have difficulty hearing?: No Does the patient have difficulty seeing, even when wearing glasses/contacts?: No Does the patient have difficulty concentrating, remembering, or making decisions?: No Does the patient have difficulty walking or climbing stairs?: No Does the patient have difficulty dressing or bathing?: No Does the patient have difficulty doing errands alone such as visiting a doctor's office or shopping?: No   6CIT Screen 10/25/2018 06/18/2017  What Year? 0 points 0 points  What month? 0 points 0 points  What time? 0 points 0 points  Count back from 20 0 points 0 points  Months in reverse 0  points 0 points  Repeat phrase 0 points 6 points  Total Score 0 6        Clinical Support from 10/25/2018 in [facility]  AUDIT-C Score  0      N/A Cardiac Risk Factors include: advanced age (>58men, >70 women);dyslipidemia;hypertension;sedentary lifestyle     Home Environment:   Lives in one story home by herself  Scattered rugs( yes) grips to hold still No grab bars No trouble climbing stairs   Patient Active Problem List   Diagnosis Date Noted  . Vitamin D deficiency 08/02/2018  . Prediabetes 08/02/2018  . Carpal tunnel syndrome 02/10/2018  . Tinnitus 06/25/2016  . Numbness 04/13/2016  . Loss of smell 04/13/2016  . Pure hypercholesterolemia 11/30/2014  . Degenerative disc disease, cervical 04/24/2014  . Essential hypertension, benign 10/19/2013  . Insomnia 10/19/2013  . Herpes labialis 10/19/2013  . Anxiety and depression 07/25/2012  . GERD (gastroesophageal reflux disease) 07/25/2012     Past Medical History:  Diagnosis Date  . Anxiety   . Cervical spondylosis   . Depression   . GERD (gastroesophageal reflux disease)   . Hyperlipidemia   . Hypertension   . IBS (irritable bowel syndrome)    Constipation with diarrhea.  . Oral herpes      Past Surgical History:  Procedure Laterality Date  . ABDOMINAL HYSTERECTOMY  1978   DUB; ovaries intact.  . APPENDECTOMY  1988  . BREAST BIOPSY Bilateral 2009   Calcium Deposit- 1980's and also in 2009/ Right side 1995  . BREAST EXCISIONAL BIOPSY Bilateral   . COLONOSCOPY  05/11/2009   Normal.  Previous colonoscopy  with polyps. [facility].    . DEBRIDEMENT TENNIS ELBOW    . ROTATOR CUFF REPAIR       Family History  Problem Relation Age of Onset  . Hyperlipidemia Mother   . Hypertension Mother   . Cancer Mother        lung  . Diabetes Mother   . Glaucoma Mother   . Stroke Mother 60  . Heart disease Mother   . Asthma Father   . Emphysema Father   . COPD Father   . Glaucoma Father   .  Hypertension Sister   . Breast cancer Neg Hx      Social History   Socioeconomic History  . Marital status: Divorced    Spouse name: Not on file  . Number of children: 2  . Years of education: Bachelors  . Highest education level: Not on file  Occupational History  . Occupation: Chemical engineer: [employer]    Comment: also works at [employer]  Social Needs  . Financial resource strain: Not hard at all  . Food insecurity    Worry: Never true    Inability: Never true  . Transportation needs    Medical: No    Non-medical: No  Tobacco Use  . Smoking status: Never Smoker  . Smokeless tobacco: Never Used  Substance and Sexual Activity  . Alcohol use: No    Frequency: Never  . Drug use: No  . Sexual activity: Yes    Partners: Male    Birth control/protection: Surgical, Post-menopausal  Lifestyle  . Physical activity    Days per week: 0 days    Minutes per session: 0 min  . Stress: To some extent  Relationships  . Social Herbalist on phone: Never    Gets together: Never    Attends religious service: More than 4 times per year    Active member of club or organization: No    Attends meetings of clubs or organizations: Never    Relationship status: Divorced  . Intimate partner violence    Fear of current or ex partner: No    Emotionally abused: No    Physically abused: No    Forced sexual activity: No  Other Topics Concern  . Not on file  Social History Narrative   Marital status: divorced since 1999; dating seriously in 2019 since 2015.      Children: one son, one daughter; 2 grandchildren.      Lives: alone in house with 2 dogs     Employment:  Retired.  Needs to work in 2019.      Tobacco: none      Alcohol: rare      Drugs: none      Exercise:  Sporadic   Right-handed.   Occasional caffeine use.      ADLs: independent with ADLs; drives; no assistant devices.      Advanced Directives: none; FULL CODE.     Allergies  Allergen Reactions   . Amoxicillin   . Augmentin [Amoxicillin-Pot Clavulanate]   . Codeine   . Depakote [Divalproex Sodium]   . Latex   . Lipitor [Atorvastatin]   . Penicillins   . Septra [Sulfamethoxazole-Trimethoprim]   . Zocor [Simvastatin]      Prior to Admission medications   Medication Sig Start Date End Date Taking? Authorizing Provider  albuterol (PROVENTIL HFA;VENTOLIN HFA) 108 (90 Base) MCG/ACT inhaler Inhale 2 puffs into the lungs every 4 (four) hours as  needed for wheezing or shortness of breath (cough, shortness of breath or wheezing.). 02/11/16  Yes Wardell Honour, MD  FLUoxetine (PROZAC) 20 MG capsule Take 2 capsules (40 mg total) by mouth daily. 08/02/18  Yes Rutherford Guys, MD  metoprolol succinate (TOPROL-XL) 25 MG 24 hr tablet Take 1 tablet (25 mg total) by mouth daily. 08/02/18  Yes Rutherford Guys, MD  pantoprazole (PROTONIX) 40 MG tablet Take 1 tablet (40 mg total) by mouth daily. 08/02/18  Yes Rutherford Guys, MD  rosuvastatin (CRESTOR) 10 MG tablet Take 1 tablet (10 mg total) by mouth daily. 08/02/18  Yes Rutherford Guys, MD  spironolactone (ALDACTONE) 50 MG tablet Take 1 tablet (50 mg total) by mouth daily. 08/02/18  Yes Rutherford Guys, MD  Vitamin D, Ergocalciferol, (DRISDOL) 1.25 MG (50000 UT) CAPS capsule Take 1 capsule (50,000 Units total) by mouth every 7 (seven) days. 08/02/18  Yes Rutherford Guys, MD  famotidine (PEPCID) 20 MG tablet Take 1 tablet (20 mg total) by mouth at bedtime. Patient not taking: Reported on 10/25/2018 08/02/18   Rutherford Guys, MD  valACYclovir (VALTREX) 1000 MG tablet Take 1 tablet (1,000 mg total) by mouth 2 (two) times daily. Patient not taking: Reported on 10/25/2018 06/30/17   Wardell Honour, MD     Depression screen [facility] 2/9 10/25/2018 08/29/2018 06/30/2017 06/18/2017 02/10/2017  Decreased Interest 0 0 3 3 0  Down, Depressed, Hopeless 0 0 1 1 0  PHQ - 2 Score 0 0 4 4 0  Altered sleeping - - 3 3 -  Tired, decreased energy - - 1 0 -  Change in  appetite - - 1 0 -  Feeling bad or failure about yourself  - - 1 1 -  Trouble concentrating - - 1 3 -  Moving slowly or fidgety/restless - - 0 0 -  Suicidal thoughts - - 0 0 -  PHQ-9 Score - - 11 11 -  Difficult doing work/chores - - - Not difficult at all -  Some recent data might be hidden     Fall Risk  10/25/2018 08/29/2018 06/30/2017 06/18/2017 02/10/2017  Falls in the past year? 0 0 Yes Yes Yes  Number falls in past yr: 0 - 2 or more 2 or more 1  Comment - - - - -  Injury with Fall? 0 0 No No No  Comment - - - - -  Risk for fall due to : - - - Other (Comment) -  Risk for fall due to: Comment - - - slipped in bathtub, dog tripped her -  Follow up Falls evaluation completed;Education provided;Falls prevention discussed - - Falls prevention discussed -      PHYSICAL EXAM: BP (!) 153/83 Comment: per patient  Wt 134 lb (60.8 kg)   BMI 24.66 kg/m    Wt Readings from Last 3 Encounters:  10/25/18 134 lb (60.8 kg)  02/10/18 134 lb 8 oz (61 kg)  06/30/17 145 lb (65.8 kg)     No exam data present    Physical Exam   Education/Counseling provided regarding diet and exercise, prevention of chronic diseases, smoking/tobacco cessation, if applicable, and reviewed "Covered Medicare Preventive Services."

## 2018-10-25 NOTE — Patient Instructions (Signed)
Preventive Care 69 Years and Older, Female Preventive care refers to lifestyle choices and visits with your health care provider that can promote health and wellness. What does preventive care include?  A yearly physical exam. This is also called an annual well check.  Dental exams once or twice a year.  Routine eye exams. Ask your health care provider how often you should have your eyes checked.  Personal lifestyle choices, including: ? Daily care of your teeth and gums. ? Regular physical activity. ? Eating a healthy diet. ? Avoiding tobacco and drug use. ? Limiting alcohol use. ? Practicing safe sex. ? Taking low-dose aspirin every day. ? Taking vitamin and mineral supplements as recommended by your health care provider. What happens during an annual well check? The services and screenings done by your health care provider during your annual well check will depend on your age, overall health, lifestyle risk factors, and family history of disease. Counseling Your health care provider may ask you questions about your:  Alcohol use.  Tobacco use.  Drug use.  Emotional well-being.  Home and relationship well-being.  Sexual activity.  Eating habits.  History of falls.  Memory and ability to understand (cognition).  Work and work Statistician.  Reproductive health.  Screening You may have the following tests or measurements:  Height, weight, and BMI.  Blood pressure.  Lipid and cholesterol levels. These may be checked every 5 years, or more frequently if you are over 30 years old.  Skin check.  Lung cancer screening. You may have this screening every year starting at age 27 if you have a 30-pack-year history of smoking and currently smoke or have quit within the past 15 years.  Colorectal cancer screening. All adults should have this screening starting at age 33 and continuing until age 46. You will have tests every 1-10 years, depending on your results and the  type of screening test. People at increased risk should start screening at an earlier age. Screening tests may include: ? Guaiac-based fecal occult blood testing. ? Fecal immunochemical test (FIT). ? Stool DNA test. ? Virtual colonoscopy. ? Sigmoidoscopy. During this test, a flexible tube with a tiny camera (sigmoidoscope) is used to examine your rectum and lower colon. The sigmoidoscope is inserted through your anus into your rectum and lower colon. ? Colonoscopy. During this test, a long, thin, flexible tube with a tiny camera (colonoscope) is used to examine your entire colon and rectum.  Hepatitis C blood test.  Hepatitis B blood test.  Sexually transmitted disease (STD) testing.  Diabetes screening. This is done by checking your blood sugar (glucose) after you have not eaten for a while (fasting). You may have this done every 1-3 years.  Bone density scan. This is done to screen for osteoporosis. You may have this done starting at age 37.  Mammogram. This may be done every 1-2 years. Talk to your health care provider about how often you should have regular mammograms. Talk with your health care provider about your test results, treatment options, and if necessary, the need for more tests. Vaccines Your health care provider may recommend certain vaccines, such as:  Influenza vaccine. This is recommended every year.  Tetanus, diphtheria, and acellular pertussis (Tdap, Td) vaccine. You may need a Td booster every 10 years.  Varicella vaccine. You may need this if you have not been vaccinated.  Zoster vaccine. You may need this after age 38.  Measles, mumps, and rubella (MMR) vaccine. You may need at least  one dose of MMR if you were born in 1957 or later. You may also need a second dose.  Pneumococcal 13-valent conjugate (PCV13) vaccine. One dose is recommended after age 65.  Pneumococcal polysaccharide (PPSV23) vaccine. One dose is recommended after age 65.  Meningococcal  vaccine. You may need this if you have certain conditions.  Hepatitis A vaccine. You may need this if you have certain conditions or if you travel or work in places where you may be exposed to hepatitis A.  Hepatitis B vaccine. You may need this if you have certain conditions or if you travel or work in places where you may be exposed to hepatitis B.  Haemophilus influenzae type b (Hib) vaccine. You may need this if you have certain conditions. Talk to your health care provider about which screenings and vaccines you need and how often you need them. This information is not intended to replace advice given to you by your health care provider. Make sure you discuss any questions you have with your health care provider. Document Released: 05/24/2015 Document Revised: 06/17/2017 Document Reviewed: 02/26/2015 Elsevier Interactive Patient Education  2019 Elsevier Inc.  

## 2018-11-08 NOTE — Therapy (Signed)
[facility] [address] [place], [address] Phone: [phone]   Fax:  [phone]  Patient Details  Name: Maria Sweeney MRN: [id number] Date of Birth: 12-10-1950 Referring Provider:  Ashok Pall, MD  Encounter Date: 07/09/2016  Pt was marked as a no show this date and chart was opened in error by Mont Dutton.  Note ran to remove chart from open chart list.   [name] 11/08/2018, 8:58 AM  [name] [address] [place], [address] Phone: [phone]   Fax:  [phone]

## 2018-12-15 DIAGNOSIS — H04123 Dry eye syndrome of bilateral lacrimal glands: Secondary | ICD-10-CM | POA: Diagnosis not present

## 2018-12-26 DIAGNOSIS — D2271 Melanocytic nevi of right lower limb, including hip: Secondary | ICD-10-CM | POA: Diagnosis not present

## 2018-12-26 DIAGNOSIS — L570 Actinic keratosis: Secondary | ICD-10-CM | POA: Diagnosis not present

## 2018-12-26 DIAGNOSIS — D485 Neoplasm of uncertain behavior of skin: Secondary | ICD-10-CM | POA: Diagnosis not present

## 2018-12-26 DIAGNOSIS — D0461 Carcinoma in situ of skin of right upper limb, including shoulder: Secondary | ICD-10-CM | POA: Diagnosis not present

## 2018-12-26 DIAGNOSIS — D225 Melanocytic nevi of trunk: Secondary | ICD-10-CM | POA: Diagnosis not present

## 2018-12-26 DIAGNOSIS — L298 Other pruritus: Secondary | ICD-10-CM | POA: Diagnosis not present

## 2018-12-26 DIAGNOSIS — D2272 Melanocytic nevi of left lower limb, including hip: Secondary | ICD-10-CM | POA: Diagnosis not present

## 2018-12-26 DIAGNOSIS — D2261 Melanocytic nevi of right upper limb, including shoulder: Secondary | ICD-10-CM | POA: Diagnosis not present

## 2018-12-26 DIAGNOSIS — D2262 Melanocytic nevi of left upper limb, including shoulder: Secondary | ICD-10-CM | POA: Diagnosis not present

## 2018-12-28 DIAGNOSIS — H401132 Primary open-angle glaucoma, bilateral, moderate stage: Secondary | ICD-10-CM | POA: Diagnosis not present

## 2019-01-02 DIAGNOSIS — H401132 Primary open-angle glaucoma, bilateral, moderate stage: Secondary | ICD-10-CM | POA: Diagnosis not present

## 2019-02-02 DIAGNOSIS — D0461 Carcinoma in situ of skin of right upper limb, including shoulder: Secondary | ICD-10-CM | POA: Diagnosis not present

## 2019-02-02 DIAGNOSIS — L570 Actinic keratosis: Secondary | ICD-10-CM | POA: Diagnosis not present

## 2019-02-09 ENCOUNTER — Telehealth: Payer: Self-pay

## 2019-02-09 ENCOUNTER — Other Ambulatory Visit: Payer: Self-pay | Admitting: Family Medicine

## 2019-02-09 DIAGNOSIS — Z1231 Encounter for screening mammogram for malignant neoplasm of breast: Secondary | ICD-10-CM

## 2019-02-10 ENCOUNTER — Other Ambulatory Visit: Payer: Self-pay

## 2019-02-10 ENCOUNTER — Ambulatory Visit (INDEPENDENT_AMBULATORY_CARE_PROVIDER_SITE_OTHER): Payer: PPO | Admitting: Family Medicine

## 2019-02-10 ENCOUNTER — Encounter: Payer: Self-pay | Admitting: Family Medicine

## 2019-02-10 VITALS — BP 155/67 | HR 60 | Temp 98.5°F | Ht 61.81 in | Wt 139.0 lb

## 2019-02-10 DIAGNOSIS — F419 Anxiety disorder, unspecified: Secondary | ICD-10-CM | POA: Diagnosis not present

## 2019-02-10 DIAGNOSIS — F329 Major depressive disorder, single episode, unspecified: Secondary | ICD-10-CM

## 2019-02-10 DIAGNOSIS — R7303 Prediabetes: Secondary | ICD-10-CM

## 2019-02-10 DIAGNOSIS — K219 Gastro-esophageal reflux disease without esophagitis: Secondary | ICD-10-CM | POA: Diagnosis not present

## 2019-02-10 DIAGNOSIS — Z23 Encounter for immunization: Secondary | ICD-10-CM

## 2019-02-10 DIAGNOSIS — E559 Vitamin D deficiency, unspecified: Secondary | ICD-10-CM | POA: Diagnosis not present

## 2019-02-10 DIAGNOSIS — Z1231 Encounter for screening mammogram for malignant neoplasm of breast: Secondary | ICD-10-CM | POA: Diagnosis not present

## 2019-02-10 DIAGNOSIS — I10 Essential (primary) hypertension: Secondary | ICD-10-CM | POA: Diagnosis not present

## 2019-02-10 DIAGNOSIS — E7800 Pure hypercholesterolemia, unspecified: Secondary | ICD-10-CM

## 2019-02-10 MED ORDER — FLUOXETINE HCL 20 MG PO CAPS: 60.0000 mg | ORAL_CAPSULE | Freq: Every day | ORAL | 1 refills | Status: DC

## 2019-02-10 NOTE — Telephone Encounter (Signed)
error 

## 2019-02-10 NOTE — Patient Instructions (Signed)
° ° ° °  If you have lab work done today you will be contacted with your lab results within the next 2 weeks.  If you have not heard from us then please contact us. The fastest way to get your results is to register for My Chart. ° ° °IF you received an x-ray today, you will receive an invoice from [facility]. Please contact [facility] at [phone] with questions or concerns regarding your invoice.  ° °IF you received labwork today, you will receive an invoice from [facility]. Please contact [facility] at [phone] with questions or concerns regarding your invoice.  ° °Our billing staff will not be able to assist you with questions regarding bills from these companies. ° °You will be contacted with the lab results as soon as they are available. The fastest way to get your results is to activate your My Chart account. Instructions are located on the last page of this paperwork. If you have not heard from us regarding the results in 2 weeks, please contact this office. °  ° ° ° °

## 2019-02-10 NOTE — Progress Notes (Signed)
10/2/202011:10 AM  Maria Sweeney [birth year], 68 y.o., female [id number]  Chief Complaint  Patient presents with  . Follow-up    would rather have vision done at eye doctor nx wk, has cataract, glaucoma and dry eye. Also asking for referral for mammo, Having tenderness in the breast    HPI:   Patient is a 68 y.o. female with past medical history significant for prediabetes, viatmin D, HTN, HLP, anxiety and depression, GERD, cold sores who presents today for followup  Last OV with me in March 2020 - telemedicine  Patient completed high dose vitamin D but did not start taking OTC vit D supplement  Has not taken BP meds this morning as she is fasting  She checks BP at home occasionally, reports 130/70s  Reports depression is not well controlled, anhedonia, no energy, sadness Has been on prozac 40mg  for years, used to be on clonazepam Since off bzd has constant auditory hallucinations - music Has noticed increase panic attacks since being on timolol Has not seen psych, not doing counseling, has never been hosp, denies SI Sees optho later this month - will discuss concerns re eye ggt  She has noticed increase right lower breast tenderness and left nipple tenderness over then past couple months, states her breast are usually tender Last mammo aug 2019 - normal Reports several benign bx  BP Readings from Last 3 Encounters:  02/10/19 (!) 155/67  10/25/18 (!) 153/83  02/10/18 120/68   Lab Results  Component Value Date   HGBA1C 5.6 02/11/2016   HGBA1C 6.0 (H) 02/22/2015   Lab Results  Component Value Date   LDLCALC 117 (H) 07/30/2018   CREATININE 0.90 07/30/2018     Depression screen PHQ 2/9 10/25/2018 08/29/2018 06/30/2017  Decreased Interest 0 0 3  Down, Depressed, Hopeless 0 0 1  PHQ - 2 Score 0 0 4  Altered sleeping - - 3  Tired, decreased energy - - 1  Change in appetite - - 1  Feeling bad or failure about yourself  - - 1  Trouble concentrating - - 1  Moving  slowly or fidgety/restless - - 0  Suicidal thoughts - - 0  PHQ-9 Score - - 11  Difficult doing work/chores - - -  Some recent data might be hidden    Fall Risk  10/25/2018 08/29/2018 06/30/2017 06/18/2017 02/10/2017  Falls in the past year? 0 0 Yes Yes Yes  Number falls in past yr: 0 - 2 or more 2 or more 1  Comment - - - - -  Injury with Fall? 0 0 No No No  Comment - - - - -  Risk for fall due to : - - - Other (Comment) -  Risk for fall due to: Comment - - - slipped in bathtub, dog tripped her -  Follow up Falls evaluation completed;Education provided;Falls prevention discussed - - Falls prevention discussed -     Allergies  Allergen Reactions  . Amoxicillin   . Augmentin [Amoxicillin-Pot Clavulanate]   . Codeine   . Depakote [Divalproex Sodium]   . Latex   . Lipitor [Atorvastatin]   . Penicillins   . Septra [Sulfamethoxazole-Trimethoprim]   . Zocor [Simvastatin]     Prior to Admission medications   Medication Sig Start Date End Date Taking? Authorizing Provider  albuterol (PROVENTIL HFA;VENTOLIN HFA) 108 (90 Base) MCG/ACT inhaler Inhale 2 puffs into the lungs every 4 (four) hours as needed for wheezing or shortness of breath (cough, shortness of breath or  wheezing.). 02/11/16  Yes Wardell Honour, MD  FLUoxetine (PROZAC) 20 MG capsule Take 2 capsules (40 mg total) by mouth daily. 08/02/18  Yes Rutherford Guys, MD  latanoprost (XALATAN) 0.005 % ophthalmic solution 1 drop at bedtime.   Yes [provider]  metoprolol succinate (TOPROL-XL) 25 MG 24 hr tablet Take 1 tablet (25 mg total) by mouth daily. 08/02/18  Yes Rutherford Guys, MD  pantoprazole (PROTONIX) 40 MG tablet Take 1 tablet (40 mg total) by mouth daily. 08/02/18  Yes Rutherford Guys, MD  rosuvastatin (CRESTOR) 10 MG tablet Take 1 tablet (10 mg total) by mouth daily. 08/02/18  Yes Rutherford Guys, MD  spironolactone (ALDACTONE) 50 MG tablet Take 1 tablet (50 mg total) by mouth daily. 08/02/18  Yes Rutherford Guys,  MD  timolol (BETIMOL) 0.25 % ophthalmic solution 1-2 drops 2 (two) times daily.   Yes [provider]  valACYclovir (VALTREX) 1000 MG tablet Take 1 tablet (1,000 mg total) by mouth 2 (two) times daily. 06/30/17  Yes Wardell Honour, MD  famotidine (PEPCID) 20 MG tablet Take 1 tablet (20 mg total) by mouth at bedtime. Patient not taking: Reported on 02/10/2019 08/02/18   Rutherford Guys, MD    Past Medical History:  Diagnosis Date  . Anxiety   . Cervical spondylosis   . Depression   . GERD (gastroesophageal reflux disease)   . Hyperlipidemia   . Hypertension   . IBS (irritable bowel syndrome)    Constipation with diarrhea.  . Oral herpes     Past Surgical History:  Procedure Laterality Date  . ABDOMINAL HYSTERECTOMY  1978   DUB; ovaries intact.  . APPENDECTOMY  1988  . BREAST BIOPSY Bilateral 2009   Calcium Deposit- 1980's and also in 2009/ Right side 1995  . BREAST EXCISIONAL BIOPSY Bilateral   . COLONOSCOPY  05/11/2009   Normal.  Previous colonoscopy with polyps. [facility].    . DEBRIDEMENT TENNIS ELBOW    . ROTATOR CUFF REPAIR      Social History   Tobacco Use  . Smoking status: Never Smoker  . Smokeless tobacco: Never Used  Substance Use Topics  . Alcohol use: No    Frequency: Never    Family History  Problem Relation Age of Onset  . Hyperlipidemia Mother   . Hypertension Mother   . Cancer Mother        lung  . Diabetes Mother   . Glaucoma Mother   . Stroke Mother 26  . Heart disease Mother   . Asthma Father   . Emphysema Father   . COPD Father   . Glaucoma Father   . Hypertension Sister   . Breast cancer Neg Hx     Review of Systems  Constitutional: Negative for chills and fever.  Respiratory: Negative for cough and shortness of breath.   Cardiovascular: Negative for chest pain, palpitations and leg swelling.  Gastrointestinal: Negative for abdominal pain, nausea and vomiting.  per hpi   OBJECTIVE:  Today's Vitals   02/10/19 1019  BP:  (!) 155/67  Pulse: 60  Temp: 98.5 F (36.9 C)  SpO2: 97%  Weight: 139 lb (63 kg)  Height: 5' 1.81" (1.57 m)   Body mass index is 25.58 kg/m.   Physical Exam Vitals signs and nursing note reviewed. Exam conducted with a chaperone present.  Constitutional:      Appearance: She is well-developed.  HENT:     Head: Normocephalic and atraumatic.  Right Ear: Hearing, tympanic membrane, ear canal and external ear normal.     Left Ear: Hearing, tympanic membrane, ear canal and external ear normal.     Mouth/Throat:     Mouth: Mucous membranes are moist.     Pharynx: No oropharyngeal exudate or posterior oropharyngeal erythema.  Eyes:     Extraocular Movements: Extraocular movements intact.     Conjunctiva/sclera: Conjunctivae normal.     Pupils: Pupils are equal, round, and reactive to light.  Neck:     Musculoskeletal: Neck supple.     Thyroid: No thyromegaly.  Cardiovascular:     Rate and Rhythm: Normal rate and regular rhythm.     Heart sounds: Normal heart sounds. No murmur. No friction rub. No gallop.   Pulmonary:     Effort: Pulmonary effort is normal.     Breath sounds: Normal breath sounds. No wheezing, rhonchi or rales.  Chest:     Breasts: Breasts are symmetrical.        Right: Tenderness present. No mass, nipple discharge or skin change.        Left: Tenderness present. No mass, nipple discharge or skin change.  Abdominal:     General: Bowel sounds are normal. There is no distension.     Palpations: Abdomen is soft. There is no hepatomegaly, splenomegaly or mass.     Tenderness: There is no abdominal tenderness.  Musculoskeletal: Normal range of motion.     Right lower leg: No edema.     Left lower leg: No edema.  Lymphadenopathy:     Cervical: No cervical adenopathy.     Upper Body:     Right upper body: No supraclavicular, axillary or pectoral adenopathy.     Left upper body: No supraclavicular, axillary or pectoral adenopathy.  Skin:    General: Skin is  warm and dry.  Neurological:     Mental Status: She is alert and oriented to person, place, and time.     Cranial Nerves: No cranial nerve deficit.     Gait: Gait normal.     Deep Tendon Reflexes: Reflexes are normal and symmetric.  Psychiatric:        Mood and Affect: Mood normal.        Behavior: Behavior normal.     No results found for this or any previous visit (from the past 24 hour(s)).  No results found.   ASSESSMENT and PLAN  1. Anxiety and depression Not controlled. Increase prozac to 60mg  daily. Discuss concerns with ophtho. Consider changing SSRI as has been on prozac for years. lexapro preferred as also has hotflashes. - TSH - FLUoxetine (PROZAC) 20 MG capsule; Take 3 capsules (60 mg total) by mouth daily.  2. Need for vaccination - Flu Vaccine QUAD High Dose(Fluad)  3. Vitamin D deficiency Labs pending. Discussed potential effect on mood. Discussed starting OTC supplement, 2000 units a day. - Vitamin D, 25-hydroxy  4. Prediabetes Labs pending. - Hemoglobin A1c  5. Pure hypercholesterolemia Checking labs today, medications will be adjusted as needed.  - Comprehensive metabolic panel - Lipid panel  6. Essential hypertension, benign Above goal in setting of not taken her medications today. Re-eval at next OV. - CBC  7. Gastroesophageal reflux disease without esophagitis Controlled. Cont current mgt  8. Visit for screening mammogram - MM DIGITAL SCREENING BILATERAL; Future  Return in about 4 weeks (around 03/10/2019).    Rutherford Guys, MD Primary Care at [place], [place] [address] Ph.  [phone]  Fax [phone]

## 2019-02-11 LAB — VITAMIN D 25 HYDROXY (VIT D DEFICIENCY, FRACTURES): Vit D, 25-Hydroxy: 32.4 ng/mL (ref 30.0–100.0)

## 2019-02-11 LAB — LIPID PANEL
Chol/HDL Ratio: 3.6 ratio (ref 0.0–4.4)
Cholesterol, Total: 196 mg/dL (ref 100–199)
HDL: 54 mg/dL (ref >39)
LDL Chol Calc (NIH): 99 mg/dL (ref 0–99)
Triglycerides: 255 mg/dL — ABNORMAL HIGH (ref 0–149)
VLDL Cholesterol Cal: 43 mg/dL — ABNORMAL HIGH (ref 5–40)

## 2019-02-11 LAB — COMPREHENSIVE METABOLIC PANEL
ALT: 42 IU/L — ABNORMAL HIGH (ref 0–32)
AST: 33 IU/L (ref 0–40)
Albumin/Globulin Ratio: 2.2 (ref 1.2–2.2)
Albumin: 5.1 g/dL — ABNORMAL HIGH (ref 3.8–4.8)
Alkaline Phosphatase: 114 IU/L (ref 39–117)
BUN/Creatinine Ratio: 12 (ref 12–28)
BUN: 13 mg/dL (ref 8–27)
Bilirubin Total: 0.8 mg/dL (ref 0.0–1.2)
CO2: 23 mmol/L (ref 20–29)
Calcium: 10.3 mg/dL (ref 8.7–10.3)
Chloride: 102 mmol/L (ref 96–106)
Creatinine, Ser: 1.05 mg/dL — ABNORMAL HIGH (ref 0.57–1.00)
GFR calc Af Amer: 63 mL/min/{1.73_m2} (ref >59)
GFR calc non Af Amer: 55 mL/min/{1.73_m2} — ABNORMAL LOW (ref >59)
Globulin, Total: 2.3 g/dL (ref 1.5–4.5)
Glucose: 101 mg/dL — ABNORMAL HIGH (ref 65–99)
Potassium: 4.8 mmol/L (ref 3.5–5.2)
Sodium: 140 mmol/L (ref 134–144)
Total Protein: 7.4 g/dL (ref 6.0–8.5)

## 2019-02-11 LAB — CBC
Hematocrit: 42.8 % (ref 34.0–46.6)
Hemoglobin: 14.7 g/dL (ref 11.1–15.9)
MCH: 32.5 pg (ref 26.6–33.0)
MCHC: 34.3 g/dL (ref 31.5–35.7)
MCV: 95 fL (ref 79–97)
Platelets: 297 10*3/uL (ref 150–450)
RBC: 4.52 x10E6/uL (ref 3.77–5.28)
RDW: 12.2 % (ref 11.7–15.4)
WBC: 9.6 10*3/uL (ref 3.4–10.8)

## 2019-02-11 LAB — HEMOGLOBIN A1C
Est. average glucose Bld gHb Est-mCnc: 131 mg/dL
Hgb A1c MFr Bld: 6.2 % — ABNORMAL HIGH (ref 4.8–5.6)

## 2019-02-11 LAB — TSH: TSH: 3 u[IU]/mL (ref 0.450–4.500)

## 2019-02-24 ENCOUNTER — Telehealth: Payer: Self-pay | Admitting: Family Medicine

## 2019-02-24 ENCOUNTER — Other Ambulatory Visit: Payer: Self-pay

## 2019-02-24 ENCOUNTER — Telehealth: Payer: Self-pay

## 2019-02-24 DIAGNOSIS — N644 Mastodynia: Secondary | ICD-10-CM

## 2019-02-24 NOTE — Telephone Encounter (Signed)
Patient's concern/request has been addressed already by another provider or staff member. 

## 2019-02-24 NOTE — Telephone Encounter (Signed)
Pt discussed tenderness in the breast and wanted a [place], a mammo was ordered, which test does the pt need?

## 2019-02-24 NOTE — Telephone Encounter (Signed)
PT CALLED AND WAS SUPPOSED TO HAVE A REFERRAL IN AT [facility] HAD NOT RECEIVED A CALL AND THEY DID NOT HAVE ORDERS FOR HER FROM [place].Marland KitchenPT DISCUSSED DIAGNOSTIC AND ULTRA SOIUND SO SHE NEEDS ORDERS FOR THAT / PLEASE CALL PT AT [phone] FR

## 2019-03-06 DIAGNOSIS — H401132 Primary open-angle glaucoma, bilateral, moderate stage: Secondary | ICD-10-CM | POA: Diagnosis not present

## 2019-03-09 ENCOUNTER — Ambulatory Visit
Admission: RE | Admit: 2019-03-09 | Discharge: 2019-03-09 | Disposition: A | Discharge status: Home / Self Care | Payer: PPO | Source: Ambulatory Visit | Attending: Family Medicine | Admitting: Family Medicine

## 2019-03-09 ENCOUNTER — Other Ambulatory Visit: Payer: Self-pay

## 2019-03-09 ENCOUNTER — Ambulatory Visit: Payer: PPO

## 2019-03-09 DIAGNOSIS — N644 Mastodynia: Secondary | ICD-10-CM

## 2019-03-09 DIAGNOSIS — R928 Other abnormal and inconclusive findings on diagnostic imaging of breast: Secondary | ICD-10-CM | POA: Diagnosis not present

## 2019-03-10 ENCOUNTER — Ambulatory Visit (INDEPENDENT_AMBULATORY_CARE_PROVIDER_SITE_OTHER): Payer: PPO | Admitting: Family Medicine

## 2019-03-10 ENCOUNTER — Ambulatory Visit
Admission: RE | Admit: 2019-03-10 | Discharge: 2019-03-10 | Disposition: A | Discharge status: Home / Self Care | Payer: PPO | Source: Ambulatory Visit | Attending: Family Medicine | Admitting: Family Medicine

## 2019-03-10 ENCOUNTER — Encounter: Payer: Self-pay | Admitting: Family Medicine

## 2019-03-10 VITALS — BP 132/77 | HR 61 | Temp 98.2°F | Ht 61.0 in | Wt 138.8 lb

## 2019-03-10 DIAGNOSIS — F419 Anxiety disorder, unspecified: Secondary | ICD-10-CM | POA: Diagnosis not present

## 2019-03-10 DIAGNOSIS — F99 Mental disorder, not otherwise specified: Secondary | ICD-10-CM

## 2019-03-10 DIAGNOSIS — I10 Essential (primary) hypertension: Secondary | ICD-10-CM

## 2019-03-10 DIAGNOSIS — F5105 Insomnia due to other mental disorder: Secondary | ICD-10-CM | POA: Diagnosis not present

## 2019-03-10 DIAGNOSIS — F329 Major depressive disorder, single episode, unspecified: Secondary | ICD-10-CM | POA: Diagnosis not present

## 2019-03-10 DIAGNOSIS — R0609 Other forms of dyspnea: Secondary | ICD-10-CM

## 2019-03-10 DIAGNOSIS — R7303 Prediabetes: Secondary | ICD-10-CM | POA: Diagnosis not present

## 2019-03-10 DIAGNOSIS — F32A Depression, unspecified: Secondary | ICD-10-CM

## 2019-03-10 DIAGNOSIS — R0600 Dyspnea, unspecified: Secondary | ICD-10-CM | POA: Diagnosis not present

## 2019-03-10 DIAGNOSIS — E7800 Pure hypercholesterolemia, unspecified: Secondary | ICD-10-CM | POA: Diagnosis not present

## 2019-03-10 MED ORDER — MIRTAZAPINE 7.5 MG PO TABS: 7.5000 mg | ORAL_TABLET | Freq: Every day | ORAL | 2 refills | Status: DC

## 2019-03-10 NOTE — Patient Instructions (Signed)
° ° ° °  If you have lab work done today you will be contacted with your lab results within the next 2 weeks.  If you have not heard from us then please contact us. The fastest way to get your results is to register for My Chart. ° ° °IF you received an x-ray today, you will receive an invoice from [facility]. Please contact [facility] at [phone] with questions or concerns regarding your invoice.  ° °IF you received labwork today, you will receive an invoice from [facility]. Please contact [facility] at [phone] with questions or concerns regarding your invoice.  ° °Our billing staff will not be able to assist you with questions regarding bills from these companies. ° °You will be contacted with the lab results as soon as they are available. The fastest way to get your results is to activate your My Chart account. Instructions are located on the last page of this paperwork. If you have not heard from us regarding the results in 2 weeks, please contact this office. °  ° ° ° °

## 2019-03-10 NOTE — Progress Notes (Signed)
10/30/202010:29 AM  Maria Sweeney 1950-10-11, 68 y.o., female [id number]  Chief Complaint  Patient presents with  . medical conditions    4 weeks f/u     HPI:   Patient is a 68 y.o. female with past medical history significant for prediabetes, viatmin D, HTN, HLP, anxiety and depression, GERD, cold sores who presents today for followup  Last OV 4 weeks ago Increased prozac to 60mg  daily - tolerating well, still having anhedonia and sadness, cont to have sign insomnia Trouble with initiation and maintenance of sleep Anxiety controlled  She has been very sedentary since quarantine, but when she tries to sweep/vacuum or do dishes, she gets sweaty and SOB, chest tightness but no pain, she does have wheezing when her chest gets tight  denies palpitations, no edema, no coughing, intermittent orthopnea, no PND, no snoring Symptoms present for about a year, getting worse Resolves with rest Had asthma as a child non smoker On BB for HTN, no known CAD  Was able to speak to othpo - changed her timolol to something else, sx preceeded timolol  Lab Results  Component Value Date   HGBA1C 6.2 (H) 02/10/2019   Lab Results  Component Value Date   CREATININE 1.05 (H) 02/10/2019   BUN 13 02/10/2019   NA 140 02/10/2019   K 4.8 02/10/2019   CL 102 02/10/2019   CO2 23 02/10/2019   Lab Results  Component Value Date   ALT 42 (H) 02/10/2019   AST 33 02/10/2019   ALKPHOS 114 02/10/2019   BILITOT 0.8 02/10/2019   Lab Results  Component Value Date   WBC 9.6 02/10/2019   HGB 14.7 02/10/2019   HCT 42.8 02/10/2019   MCV 95 02/10/2019   PLT 297 02/10/2019   Lab Results  Component Value Date   TSH 3.000 02/10/2019   Lab Results  Component Value Date   CHOL 196 02/10/2019   HDL 54 02/10/2019   LDLCALC 99 02/10/2019   TRIG 255 (H) 02/10/2019   CHOLHDL 3.6 02/10/2019    Depression screen [facility] 2/9 03/10/2019 10/25/2018 08/29/2018  Decreased Interest 1 0 0  Down, Depressed,  Hopeless 1 0 0  PHQ - 2 Score 2 0 0  Altered sleeping 3 - -  Tired, decreased energy 3 - -  Change in appetite 1 - -  Feeling bad or failure about yourself  2 - -  Trouble concentrating 2 - -  Moving slowly or fidgety/restless 0 - -  Suicidal thoughts 0 - -  PHQ-9 Score 13 - -  Difficult doing work/chores - - -  Some recent data might be hidden    Fall Risk  03/10/2019 10/25/2018 08/29/2018 06/30/2017 06/18/2017  Falls in the past year? 0 0 0 Yes Yes  Number falls in past yr: 0 0 - 2 or more 2 or more  Comment - - - - -  Injury with Fall? 0 0 0 No No  Comment - - - - -  Risk for fall due to : - - - - Other (Comment)  Risk for fall due to: Comment - - - - slipped in bathtub, dog tripped her  Follow up Falls evaluation completed Falls evaluation completed;Education provided;Falls prevention discussed - - Falls prevention discussed     Allergies  Allergen Reactions  . Amoxicillin   . Augmentin [Amoxicillin-Pot Clavulanate]   . Codeine   . Depakote [Divalproex Sodium]   . Latex   . Lipitor [Atorvastatin]   . Penicillins   .  Septra [Sulfamethoxazole-Trimethoprim]   . Zocor [Simvastatin]     Prior to Admission medications   Medication Sig Start Date End Date Taking? Authorizing Provider  albuterol (PROVENTIL HFA;VENTOLIN HFA) 108 (90 Base) MCG/ACT inhaler Inhale 2 puffs into the lungs every 4 (four) hours as needed for wheezing or shortness of breath (cough, shortness of breath or wheezing.). 02/11/16  Yes Wardell Honour, MD  FLUoxetine (PROZAC) 20 MG capsule Take 3 capsules (60 mg total) by mouth daily. 02/10/19  Yes Rutherford Guys, MD  latanoprost (XALATAN) 0.005 % ophthalmic solution 1 drop at bedtime.   Yes [provider]  metoprolol succinate (TOPROL-XL) 25 MG 24 hr tablet Take 1 tablet (25 mg total) by mouth daily. 08/02/18  Yes Rutherford Guys, MD  pantoprazole (PROTONIX) 40 MG tablet Take 1 tablet (40 mg total) by mouth daily. 08/02/18  Yes Rutherford Guys, MD   rosuvastatin (CRESTOR) 10 MG tablet Take 1 tablet (10 mg total) by mouth daily. 08/02/18  Yes Rutherford Guys, MD  spironolactone (ALDACTONE) 50 MG tablet Take 1 tablet (50 mg total) by mouth daily. 08/02/18  Yes Rutherford Guys, MD  valACYclovir (VALTREX) 1000 MG tablet Take 1 tablet (1,000 mg total) by mouth 2 (two) times daily. 06/30/17  Yes Wardell Honour, MD  timolol (BETIMOL) 0.25 % ophthalmic solution 1-2 drops 2 (two) times daily.    [provider]    Past Medical History:  Diagnosis Date  . Anxiety   . Cervical spondylosis   . Depression   . GERD (gastroesophageal reflux disease)   . Hyperlipidemia   . Hypertension   . IBS (irritable bowel syndrome)    Constipation with diarrhea.  . Oral herpes     Past Surgical History:  Procedure Laterality Date  . ABDOMINAL HYSTERECTOMY  1978   DUB; ovaries intact.  . APPENDECTOMY  1988  . BREAST BIOPSY Bilateral 2009   Calcium Deposit- 1980's and also in 2009/ Right side 1995  . BREAST EXCISIONAL BIOPSY Bilateral   . COLONOSCOPY  05/11/2009   Normal.  Previous colonoscopy with polyps. [facility].    . DEBRIDEMENT TENNIS ELBOW    . ROTATOR CUFF REPAIR      Social History   Tobacco Use  . Smoking status: Never Smoker  . Smokeless tobacco: Never Used  Substance Use Topics  . Alcohol use: No    Frequency: Never    Family History  Problem Relation Age of Onset  . Hyperlipidemia Mother   . Hypertension Mother   . Cancer Mother        lung  . Diabetes Mother   . Glaucoma Mother   . Stroke Mother 79  . Heart disease Mother   . Asthma Father   . Emphysema Father   . COPD Father   . Glaucoma Father   . Hypertension Sister   . Breast cancer Paternal Grandmother     ROS Per hpi  OBJECTIVE:  Today's Vitals   03/10/19 1016  BP: 132/77  Pulse: 61  Temp: 98.2 F (36.8 C)  TempSrc: Oral  SpO2: 96%  Weight: 138 lb 12.8 oz (63 kg)  Height: 5\' 1"  (1.549 m)   Body mass index is 26.23 kg/m.   Physical Exam  Vitals signs and nursing note reviewed.  Constitutional:      Appearance: She is well-developed.  HENT:     Head: Normocephalic and atraumatic.     Mouth/Throat:     Pharynx: No oropharyngeal exudate.  Eyes:  General: No scleral icterus.    Conjunctiva/sclera: Conjunctivae normal.     Pupils: Pupils are equal, round, and reactive to light.  Neck:     Musculoskeletal: Neck supple.  Cardiovascular:     Rate and Rhythm: Normal rate and regular rhythm.     Heart sounds: Normal heart sounds. No murmur. No friction rub. No gallop.   Pulmonary:     Effort: Pulmonary effort is normal.     Breath sounds: Normal breath sounds. No wheezing, rhonchi or rales.  Musculoskeletal:     Right lower leg: No edema.     Left lower leg: No edema.  Skin:    General: Skin is warm and dry.  Neurological:     Mental Status: She is alert and oriented to person, place, and time.     No results found for this or any previous visit (from the past 24 hour(s)).  Peak flow reading is 310, about 98 % of predicted.  My interpretation of EKG:  NSR, HR 53, no st changes  ASSESSMENT and PLAN  1. DOE (dyspnea on exertion) Worsening, affecting simple activities, might be BB but given RF cardiac eval appropriate.  - EKG 12-Lead - DG Chest 2 View; Future - Ambulatory referral to Cardiology  2. Anxiety and depression 3. Insomnia due to other mental disorder Not controlled. Adding low dose mirtazapine, reviewed r/se/b  4. Essential hypertension, benign Controlled. Continue current regime.   5. Pure hypercholesterolemia Controlled. Continue current regime.   6. Prediabetes Controlled. Continue current regime.   Other orders - Check Peak Flow  Return in about 3 months (around 06/10/2019).    Rutherford Guys, MD Primary Care at [place], [place] [address] Ph.  [phone] Fax [phone]

## 2019-03-25 NOTE — Progress Notes (Signed)
Cardiology Office Note  Date:  03/27/2019   ID:  Maria Sweeney, DOB 1950/06/06, MRN [id number]  PCP:  Rutherford Guys, MD   Chief Complaint  Patient presents with  . other    SOB pt has no other complaints today. Meds reviewed verbally with pt.    HPI:  68 yo F with PMH of HTN  HLP , intolerance to simvastatin/liptior Anxiety/depression Referred by Dr. Pamella Pert for one year of progressive DOE  She reports having significant anxiety Walks with her boyfriend/friend several days per week, able to go sometimes up to 3 miles -Does not like to go walking by herself -Recently pulled up to the park, could not get out of the car.  Did not want to go alone  Reports having some chronic shortness of breath seems to come and go Difficulty exhaling, does not feel that she can exhale all the way Difficulty talking sometimes secondary to shortness of breath Face goes red with exertion such as doing house chores Episodes of sweating having bath, doing dishes With yardwork gets flush  No energy, poor sleep  SOB  assoc with diaphoresis and chest tightness, resolves with rest, interfering with simple house chores.  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  PSH:    Past Surgical History:  Procedure Laterality Date  . ABDOMINAL HYSTERECTOMY  1978   DUB; ovaries intact.  . APPENDECTOMY  1988  . BREAST BIOPSY Bilateral 2009   Calcium Deposit- 1980's and also in 2009/ Right side 1995  . BREAST EXCISIONAL BIOPSY Bilateral   . COLONOSCOPY  05/11/2009   Normal.  Previous colonoscopy with polyps. [facility].    .  DEBRIDEMENT TENNIS ELBOW    . ROTATOR CUFF REPAIR      Current Outpatient Medications  Medication Sig Dispense Refill  . albuterol (PROVENTIL HFA;VENTOLIN HFA) 108 (90 Base) MCG/ACT inhaler Inhale 2 puffs into the lungs every 4 (four) hours as needed for wheezing or shortness of breath (cough, shortness of breath or wheezing.). 1 Inhaler 1  . dorzolamide (TRUSOPT) 2 % ophthalmic solution Place 1 drop into both eyes 2 (two) times daily.    Marland Kitchen FLUoxetine (PROZAC) 20 MG capsule Take 3 capsules (60 mg total) by mouth daily. 90 capsule 1  . latanoprost (XALATAN) 0.005 % ophthalmic solution 1 drop at bedtime.    . metoprolol succinate (TOPROL-XL) 25 MG 24 hr tablet Take 1 tablet (25 mg total) by mouth daily. 90 tablet 3  . pantoprazole (PROTONIX) 40 MG tablet Take 1 tablet (40 mg total) by mouth daily. 90 tablet 3  . rosuvastatin (CRESTOR) 10 MG tablet Take 1 tablet (10 mg total) by mouth daily. 90 tablet 3  . spironolactone (ALDACTONE) 50 MG tablet Take 1 tablet (50 mg total) by mouth daily. 90 tablet 3  . valACYclovir (VALTREX) 1000 MG tablet Take 1 tablet (1,000 mg total) by mouth 2 (two) times daily. (Patient taking differently: Take 1,000 mg by mouth as needed. ) 30 tablet 1  . mirtazapine (REMERON) 7.5 MG tablet Take 1 tablet (7.5 mg total) by mouth at bedtime. (Patient not taking: Reported on 03/27/2019) 30 tablet 2   No current  facility-administered medications for this visit.      Allergies:   Amoxicillin, Augmentin [amoxicillin-pot clavulanate], Codeine, Depakote [divalproex sodium], Latex, Lipitor [atorvastatin], Penicillins, Septra [sulfamethoxazole-trimethoprim], and Zocor [simvastatin]   Social History:  The patient  reports that she has never smoked. She has never used smokeless tobacco. She reports that she does not drink alcohol or use drugs.   Family History:   family history includes Asthma in her father; Breast cancer in her paternal grandmother; COPD in her father; Cancer in  her mother; Diabetes in her mother; Emphysema in her father; Glaucoma in her father and mother; Heart disease in her mother; Hyperlipidemia in her mother; Hypertension in her mother and sister; Stroke (age of onset: 63) in her mother.    Review of Systems: Review of Systems  Constitutional: Negative.   HENT: Negative.   Respiratory: Positive for shortness of breath.   Cardiovascular: Negative.   Gastrointestinal: Negative.   Musculoskeletal: Negative.   Neurological: Negative.   Psychiatric/Behavioral: The patient is nervous/anxious.   All other systems reviewed and are negative.   PHYSICAL EXAM: VS:  BP 130/78 (BP Location: Right Arm, Patient Position: Sitting, Cuff Size: Normal)   Pulse 68   Ht 5' 0.5" (1.537 m)   Wt 141 lb 8 oz (64.2 kg)   SpO2 98%   BMI 27.18 kg/m  , BMI Body mass index is 27.18 kg/m. GEN: Well nourished, well developed, in no acute distress HEENT: normal Neck: no JVD, carotid bruits, or masses Cardiac: RRR; no murmurs, rubs, or gallops,no edema  Respiratory:  clear to auscultation bilaterally, normal work of breathing GI: soft, nontender, nondistended, + BS MS: no deformity or atrophy Skin: warm and dry, no rash Neuro:  Strength and sensation are intact Psych: euthymic mood, full affect   Recent Labs: 02/10/2019: ALT 42; BUN 13; Creatinine, Ser 1.05; Hemoglobin 14.7; Platelets 297; Potassium 4.8; Sodium 140; TSH 3.000    Lipid Panel Lab Results  Component Value Date   CHOL 196 02/10/2019   HDL 54 02/10/2019   LDLCALC 99 02/10/2019   TRIG 255 (H) 02/10/2019      Wt Readings from Last 3 Encounters:  03/27/19 141 lb 8 oz (64.2 kg)  03/10/19 138 lb 12.8 oz (63 kg)  02/10/19 139 lb (63 kg)       ASSESSMENT AND PLAN:  Problem List Items Addressed This Visit    None    Visit Diagnoses    SOB (shortness of breath)    -  Primary   Anxiety       Mixed hyperlipidemia       Essential hypertension         Shortness of breath Long  discussion with her, discussed symptoms which seem to come and go, often at rest, sometimes with exertion -We recommended walking program, weight loss, lifestyle modification -She feels that anxiety may be playing a role in her shortness of breath symptoms -We did discuss echocardiography, stress echo, CT coronary calcium scoring.  After long discussion we will hold off on testing but this can be ordered at a later date.  She will try her walking program with her friend on a more regular basis.  Anxiety Reports previously well controlled when she was taking Klonopin on a regular basis Has not felt the same ever since she stopped this 3 years ago Feels that she is still having a difficult time adjusting to coming off .  With that said she does not really want to restart medication Recommended  a walking program for stress relief.  This might also help her conditioning, weight loss, sleep  Hyperlipidemia Tolerating statin We did discuss CT coronary calcium scoring for risk stratification Mother had coronary disease there was a smoker  Hypertension She feels the metoprolol is causing side effects such as fatigue Suggested she either wean down or stop the metoprolol but closely monitor blood pressure I suspect she will need additional medication at home Recommend she call us with blood pressure measurements -Of medication options include amlodipine, losartan/ACE inhibitor We will avoid HCTZ as she is already on spironolactone  Disposition:   F/U as needed  Long discussion with her concerning various types of cardiac imaging available, types of stress testing, discussed anxiety, effect on the body, long discussion concerning differential of shortness of breath  Total encounter time more than 60 minutes  Greater than 50% was spent in counseling and coordination of care with the patient    Signed, Esmond Plants, M.D., Ph.D. [place], [place]

## 2019-03-27 ENCOUNTER — Encounter: Payer: Self-pay | Admitting: Cardiovascular Disease

## 2019-03-27 ENCOUNTER — Ambulatory Visit (INDEPENDENT_AMBULATORY_CARE_PROVIDER_SITE_OTHER): Payer: PPO | Admitting: Cardiovascular Disease

## 2019-03-27 ENCOUNTER — Other Ambulatory Visit: Payer: Self-pay

## 2019-03-27 VITALS — BP 130/78 | HR 68 | Ht 60.5 in | Wt 141.5 lb

## 2019-03-27 DIAGNOSIS — F419 Anxiety disorder, unspecified: Secondary | ICD-10-CM | POA: Diagnosis not present

## 2019-03-27 DIAGNOSIS — R0602 Shortness of breath: Secondary | ICD-10-CM

## 2019-03-27 DIAGNOSIS — E782 Mixed hyperlipidemia: Secondary | ICD-10-CM | POA: Diagnosis not present

## 2019-03-27 DIAGNOSIS — I10 Essential (primary) hypertension: Secondary | ICD-10-CM

## 2019-03-27 NOTE — Patient Instructions (Addendum)
Medication Instructions:  Ok to do a trial off the metoprolol Watch the blood pressure Call if BP runs high  If you need a refill on your cardiac medications before your next appointment, please call your pharmacy.    Lab work: No new labs needed   If you have labs (blood work) drawn today and your tests are completely normal, you will receive your results only by: Marland Kitchen MyChart Message (if you have MyChart) OR . A paper copy in the mail If you have any lab test that is abnormal or we need to change your treatment, we will call you to review the results.   Testing/Procedures: No new testing needed   Follow-Up: At [facility], you and your health needs are our priority.  As part of our continuing mission to provide you with exceptional heart care, we have created designated Provider Care Teams.  These Care Teams include your primary Cardiologist (physician) and Advanced Practice Providers (APPs -  Physician Assistants and Nurse Practitioners) who all work together to provide you with the care you need, when you need it.  . You will need a follow up appointment as needed   . Providers on your designated Care Team:   . Murray Hodgkins, NP . Christell Faith, PA-C . Marrianne Mood, PA-C  Any Other Special Instructions Will Be Listed Below (If Applicable).  For educational health videos Log in to : [email or web address] Or : [email or web address], password : [email or web address]

## 2019-04-14 DIAGNOSIS — H401132 Primary open-angle glaucoma, bilateral, moderate stage: Secondary | ICD-10-CM | POA: Diagnosis not present

## 2019-05-17 ENCOUNTER — Other Ambulatory Visit: Payer: Self-pay | Admitting: Family Medicine

## 2019-05-17 DIAGNOSIS — F329 Major depressive disorder, single episode, unspecified: Secondary | ICD-10-CM

## 2019-05-17 DIAGNOSIS — F32A Depression, unspecified: Secondary | ICD-10-CM

## 2019-06-09 ENCOUNTER — Ambulatory Visit (INDEPENDENT_AMBULATORY_CARE_PROVIDER_SITE_OTHER): Payer: PPO | Admitting: Family Medicine

## 2019-06-09 ENCOUNTER — Other Ambulatory Visit: Payer: Self-pay

## 2019-06-09 ENCOUNTER — Encounter: Payer: Self-pay | Admitting: Family Medicine

## 2019-06-09 VITALS — BP 138/85 | HR 83 | Temp 98.0°F | Ht 60.5 in | Wt 140.0 lb

## 2019-06-09 DIAGNOSIS — Z7189 Other specified counseling: Secondary | ICD-10-CM | POA: Diagnosis not present

## 2019-06-09 DIAGNOSIS — F329 Major depressive disorder, single episode, unspecified: Secondary | ICD-10-CM | POA: Diagnosis not present

## 2019-06-09 DIAGNOSIS — F5105 Insomnia due to other mental disorder: Secondary | ICD-10-CM | POA: Diagnosis not present

## 2019-06-09 DIAGNOSIS — R0600 Dyspnea, unspecified: Secondary | ICD-10-CM | POA: Diagnosis not present

## 2019-06-09 DIAGNOSIS — F419 Anxiety disorder, unspecified: Secondary | ICD-10-CM | POA: Diagnosis not present

## 2019-06-09 DIAGNOSIS — R0609 Other forms of dyspnea: Secondary | ICD-10-CM

## 2019-06-09 DIAGNOSIS — F32A Depression, unspecified: Secondary | ICD-10-CM

## 2019-06-09 DIAGNOSIS — I10 Essential (primary) hypertension: Secondary | ICD-10-CM | POA: Diagnosis not present

## 2019-06-09 DIAGNOSIS — F99 Mental disorder, not otherwise specified: Secondary | ICD-10-CM

## 2019-06-09 NOTE — Progress Notes (Signed)
1/29/20212:02 PM  Maria Sweeney 1951/01/23, 68 y.o., female [id number]  Chief Complaint  Patient presents with  . Follow-up    cardio follow up, was told that she only had to take the toprol prn. Not taking the macrobid, made her very sleepy    HPI:   Patient is a 68 y.o. female with past medical history significant for prediabetes, viatmin D, HTN, HLP, anxiety and depression, GERD, cold soreswho presents todayfor followup  Last OV oct 2020 - ref to cards for DOE Started remeron insomnia Increased prozac for depression and anxiety  Saw cards on nov 16th 2020 - walking program, hold off testing for now, anxiety contributor, weaned off BB, fu prn  Patient reports that she is overall doing much better since stopping BB Not as tired, breathing better bp has been doing well  Mood is also much better with higher dose of prozac Takes remeron prn for insomnia, not needed very often and over sedating Has had rare panic attacks  Overall feeling as she is doing well She has no acute concerns other than question re covid vaccine  Depression screen [facility] 2/9 03/10/2019 10/25/2018 08/29/2018  Decreased Interest 1 0 0  Down, Depressed, Hopeless 1 0 0  PHQ - 2 Score 2 0 0  Altered sleeping 3 - -  Tired, decreased energy 3 - -  Change in appetite 1 - -  Feeling bad or failure about yourself  2 - -  Trouble concentrating 2 - -  Moving slowly or fidgety/restless 0 - -  Suicidal thoughts 0 - -  PHQ-9 Score 13 - -  Difficult doing work/chores - - -  Some recent data might be hidden    Fall Risk  06/09/2019 03/10/2019 10/25/2018 08/29/2018 06/30/2017  Falls in the past year? 0 0 0 0 Yes  Number falls in past yr: 0 0 0 - 2 or more  Comment - - - - -  Injury with Fall? 0 0 0 0 No  Comment - - - - -  Risk for fall due to : - - - - -  Risk for fall due to: Comment - - - - -  Follow up - Falls evaluation completed Falls evaluation completed;Education provided;Falls prevention discussed - -      Allergies  Allergen Reactions  . Amoxicillin   . Augmentin [Amoxicillin-Pot Clavulanate]   . Codeine   . Depakote [Divalproex Sodium]   . Latex   . Lipitor [Atorvastatin]   . Penicillins   . Septra [Sulfamethoxazole-Trimethoprim]   . Zocor [Simvastatin]     Prior to Admission medications   Medication Sig Start Date End Date Taking? Authorizing Provider  albuterol (PROVENTIL HFA;VENTOLIN HFA) 108 (90 Base) MCG/ACT inhaler Inhale 2 puffs into the lungs every 4 (four) hours as needed for wheezing or shortness of breath (cough, shortness of breath or wheezing.). 02/11/16  Yes Wardell Honour, MD  dorzolamide (TRUSOPT) 2 % ophthalmic solution Place 1 drop into both eyes 2 (two) times daily.   Yes [provider]  FLUoxetine (PROZAC) 20 MG capsule TAKE 3 CAPSULES BY MOUTH DAILY 05/17/19  Yes Rutherford Guys, MD  latanoprost (XALATAN) 0.005 % ophthalmic solution 1 drop at bedtime.   Yes [provider]  mirtazapine (REMERON) 7.5 MG tablet Take 1 tablet (7.5 mg total) by mouth at bedtime. 03/10/19  Yes Rutherford Guys, MD  pantoprazole (PROTONIX) 40 MG tablet Take 1 tablet (40 mg total) by mouth daily. 08/02/18  Yes Pamella Pert,  Lilia Argue, MD  rosuvastatin (CRESTOR) 10 MG tablet Take 1 tablet (10 mg total) by mouth daily. 08/02/18  Yes Rutherford Guys, MD  spironolactone (ALDACTONE) 50 MG tablet Take 1 tablet (50 mg total) by mouth daily. 08/02/18  Yes Rutherford Guys, MD  valACYclovir (VALTREX) 1000 MG tablet Take 1 tablet (1,000 mg total) by mouth 2 (two) times daily. Patient taking differently: Take 1,000 mg by mouth as needed.  06/30/17  Yes Wardell Honour, MD  nitrofurantoin, Earney [place], (MACROBID) 100 MG capsule  12/15/18   [provider]    Past Medical History:  Diagnosis Date  . Anxiety   . Cervical spondylosis   . Depression   . GERD (gastroesophageal reflux disease)   . Hyperlipidemia   . Hypertension   . IBS (irritable bowel syndrome)     Constipation with diarrhea.  . Oral herpes     Past Surgical History:  Procedure Laterality Date  . ABDOMINAL HYSTERECTOMY  1978   DUB; ovaries intact.  . APPENDECTOMY  1988  . BREAST BIOPSY Bilateral 2009   Calcium Deposit- 1980's and also in 2009/ Right side 1995  . BREAST EXCISIONAL BIOPSY Bilateral   . COLONOSCOPY  05/11/2009   Normal.  Previous colonoscopy with polyps. [facility].    . DEBRIDEMENT TENNIS ELBOW    . ROTATOR CUFF REPAIR      Social History   Tobacco Use  . Smoking status: Never Smoker  . Smokeless tobacco: Never Used  Substance Use Topics  . Alcohol use: No    Family History  Problem Relation Age of Onset  . Hyperlipidemia Mother   . Hypertension Mother   . Cancer Mother        lung  . Diabetes Mother   . Glaucoma Mother   . Stroke Mother 54  . Heart disease Mother   . Asthma Father   . Emphysema Father   . COPD Father   . Glaucoma Father   . Hypertension Sister   . Breast cancer Paternal Grandmother     Review of Systems  Constitutional: Negative for chills and fever.  Respiratory: Negative for cough and shortness of breath.   Cardiovascular: Negative for chest pain, palpitations and leg swelling.  Gastrointestinal: Negative for abdominal pain, nausea and vomiting.   Per hpi  OBJECTIVE:  Today's Vitals   06/09/19 1347  BP: 138/85  Pulse: 83  Temp: 98 F (36.7 C)  SpO2: 95%  Weight: 140 lb (63.5 kg)  Height: 5' 0.5" (1.537 m)   Body mass index is 26.89 kg/m.  Wt Readings from Last 3 Encounters:  06/09/19 140 lb (63.5 kg)  03/27/19 141 lb 8 oz (64.2 kg)  03/10/19 138 lb 12.8 oz (63 kg)    Physical Exam Vitals and nursing note reviewed.  Constitutional:      Appearance: She is well-developed.  HENT:     Head: Normocephalic and atraumatic.     Mouth/Throat:     Pharynx: No oropharyngeal exudate.  Eyes:     General: No scleral icterus.    Conjunctiva/sclera: Conjunctivae normal.     Pupils: Pupils are equal, round, and  reactive to light.  Cardiovascular:     Rate and Rhythm: Normal rate and regular rhythm.     Heart sounds: Normal heart sounds. No murmur. No friction rub. No gallop.   Pulmonary:     Effort: Pulmonary effort is normal.     Breath sounds: Normal breath sounds. No wheezing, rhonchi or rales.  Musculoskeletal:  Cervical back: Neck supple.     Right lower leg: No edema.     Left lower leg: No edema.  Skin:    General: Skin is warm and dry.  Neurological:     Mental Status: She is alert and oriented to person, place, and time.     No results found for this or any previous visit (from the past 24 hour(s)).  No results found.   ASSESSMENT and PLAN  1. Essential hypertension, benign Controlled. Continue current regime.   2. DOE (dyspnea on exertion) Sign improved with d/c BB. Cont walking program  3. Anxiety and depression Doing well on current regime  4. Insomnia due to other mental disorder Takes remeron prn which overall works well for her, discussed taking 1/2 tab to help with oversedation.  5. Educated about COVID-19 virus infection Had lengthy discussion re covid vaccine  Return in about 6 months (around 12/07/2019).    Rutherford Guys, MD Primary Care at [place], [place] [address] Ph.  [phone] Fax [phone]

## 2019-06-09 NOTE — Patient Instructions (Addendum)
   COVID-19 Vaccine Information can be found at: ShippingScam.co.uk For questions related to vaccine distribution or appointments, please email [email or web address] or call [phone].    If you have lab work done today you will be contacted with your lab results within the next 2 weeks.  If you have not heard from [place] then please contact us. The fastest way to get your results is to register for My Chart.   IF you received an x-ray today, you will receive an invoice from [facility]. Please contact [facility] at [phone] with questions or concerns regarding your invoice.   IF you received labwork today, you will receive an invoice from [facility]. Please contact [facility] at [phone] with questions or concerns regarding your invoice.   Our billing staff will not be able to assist you with questions regarding bills from these companies.  You will be contacted with the lab results as soon as they are available. The fastest way to get your results is to activate your My Chart account. Instructions are located on the last page of this paperwork. If you have not heard from [place] regarding the results in 2 weeks, please contact this office.        If you have lab work done today you will be contacted with your lab results within the next 2 weeks.  If you have not heard from [place] then please contact us. The fastest way to get your results is to register for My Chart.   IF you received an x-ray today, you will receive an invoice from [facility]. Please contact [facility] at [phone] with questions or concerns regarding your invoice.   IF you received labwork today, you will receive an invoice from [facility]. Please contact [facility] at [phone] with questions or concerns regarding your invoice.   Our billing staff will not be able to assist you with questions regarding bills from these  companies.  You will be contacted with the lab results as soon as they are available. The fastest way to get your results is to activate your My Chart account. Instructions are located on the last page of this paperwork. If you have not heard from [place] regarding the results in 2 weeks, please contact this office.

## 2019-07-04 DIAGNOSIS — Z85828 Personal history of other malignant neoplasm of skin: Secondary | ICD-10-CM | POA: Diagnosis not present

## 2019-07-04 DIAGNOSIS — D485 Neoplasm of uncertain behavior of skin: Secondary | ICD-10-CM | POA: Diagnosis not present

## 2019-07-04 DIAGNOSIS — D2271 Melanocytic nevi of right lower limb, including hip: Secondary | ICD-10-CM | POA: Diagnosis not present

## 2019-07-04 DIAGNOSIS — L821 Other seborrheic keratosis: Secondary | ICD-10-CM | POA: Diagnosis not present

## 2019-07-04 DIAGNOSIS — D2272 Melanocytic nevi of left lower limb, including hip: Secondary | ICD-10-CM | POA: Diagnosis not present

## 2019-07-04 DIAGNOSIS — D2261 Melanocytic nevi of right upper limb, including shoulder: Secondary | ICD-10-CM | POA: Diagnosis not present

## 2019-07-04 DIAGNOSIS — D225 Melanocytic nevi of trunk: Secondary | ICD-10-CM | POA: Diagnosis not present

## 2019-07-04 DIAGNOSIS — L570 Actinic keratosis: Secondary | ICD-10-CM | POA: Diagnosis not present

## 2019-07-27 ENCOUNTER — Ambulatory Visit: Payer: PPO | Attending: Internal Medicine

## 2019-07-27 DIAGNOSIS — Z23 Encounter for immunization: Secondary | ICD-10-CM

## 2019-07-27 NOTE — Progress Notes (Signed)
   Covid-19 Vaccination Clinic  Name:  REANNE AMBLE    MRN: [id number] DOB: May 03, 1951  07/27/2019  Ms. Kowalick was observed post Covid-19 immunization for 15 minutes without incident. She was provided with Vaccine Information Sheet and instruction to access the V-Safe system.   Ms. Maready was instructed to call 911 with any severe reactions post vaccine: Marland Kitchen Difficulty breathing  . Swelling of face and throat  . A fast heartbeat  . A bad rash all over body  . Dizziness and weakness   Immunizations Administered    Name Date Dose VIS Date Route   Pfizer COVID-19 Vaccine 07/27/2019  9:04 AM 0.3 mL 04/21/2019 Intramuscular   Manufacturer: [place]   Lot: [id number]   [place]: [id number]

## 2019-08-02 ENCOUNTER — Other Ambulatory Visit: Payer: Self-pay | Admitting: Family Medicine

## 2019-08-02 DIAGNOSIS — F419 Anxiety disorder, unspecified: Secondary | ICD-10-CM

## 2019-08-02 DIAGNOSIS — F329 Major depressive disorder, single episode, unspecified: Secondary | ICD-10-CM

## 2019-08-22 ENCOUNTER — Ambulatory Visit: Payer: PPO | Attending: Internal Medicine

## 2019-08-22 DIAGNOSIS — Z23 Encounter for immunization: Secondary | ICD-10-CM

## 2019-08-22 NOTE — Progress Notes (Signed)
   Covid-19 Vaccination Clinic  Name:  KELCE RIDEAUX    MRN: [id number] DOB: [birth year]  08/22/2019  Ms. Bianca was observed post Covid-19 immunization for 30 minutes based on pre-vaccination screening without incident. She was provided with Vaccine Information Sheet and instruction to access the V-Safe system.   Ms. Sparlin was instructed to call 911 with any severe reactions post vaccine: Marland Kitchen Difficulty breathing  . Swelling of face and throat  . A fast heartbeat  . A bad rash all over body  . Dizziness and weakness   Immunizations Administered    Name Date Dose VIS Date Route   Pfizer COVID-19 Vaccine 08/22/2019  4:33 PM 0.3 mL 04/21/2019 Intramuscular   Manufacturer: [place]   Lot: [id number]   [place]: [id number]

## 2019-08-29 ENCOUNTER — Other Ambulatory Visit: Payer: Self-pay | Admitting: Family Medicine

## 2019-08-29 DIAGNOSIS — F419 Anxiety disorder, unspecified: Secondary | ICD-10-CM

## 2019-08-29 DIAGNOSIS — F329 Major depressive disorder, single episode, unspecified: Secondary | ICD-10-CM

## 2019-09-20 ENCOUNTER — Other Ambulatory Visit: Payer: Self-pay | Admitting: Family Medicine

## 2019-09-20 NOTE — Telephone Encounter (Signed)
No further refills without office visit 

## 2019-09-20 NOTE — Telephone Encounter (Signed)
Requested medication (s) are due for refill today: Yes  Requested medication (s) are on the active medication list: Yes  Last refill:   Both medications - 08/02/18  Future visit scheduled: Yes  Notes to clinic:  Prescriptions have expired.    Requested Prescriptions  Pending Prescriptions Disp Refills   rosuvastatin (CRESTOR) 10 MG tablet [Pharmacy Med Name: ROSUVASTATIN CALCIUM 10 MG TAB] 90 tablet 3    Sig: TAKE 1 TABLET BY MOUTH ONCE A DAY      Cardiovascular:  Antilipid - Statins Failed - 09/20/2019 12:38 PM      Failed - LDL in normal range and within 360 days    LDL Chol Calc (NIH)  Date Value Ref Range Status  02/10/2019 99 0 - 99 mg/dL Final          Failed - Triglycerides in normal range and within 360 days    Triglycerides  Date Value Ref Range Status  02/10/2019 255 (H) 0 - 149 mg/dL Final          Passed - Total Cholesterol in normal range and within 360 days    Cholesterol, Total  Date Value Ref Range Status  02/10/2019 196 100 - 199 mg/dL Final          Passed - HDL in normal range and within 360 days    HDL  Date Value Ref Range Status  02/10/2019 54 >39 mg/dL Final          Passed - Patient is not pregnant      Passed - Valid encounter within last 12 months    Recent Outpatient Visits           3 months ago Essential hypertension, benign   [facility], Lilia Argue, MD   6 months ago DOE (dyspnea on exertion)   [facility], Lilia Argue, MD   7 months ago Anxiety and depression   [facility], Lilia Argue, MD   11 months ago Medicare annual wellness visit, subsequent   Primary Care at [facility], Arlie Solomons, MD   1 year ago Acute cystitis without hematuria   [facility], Delfino Lovett, NP       Future Appointments             In 2 months Rutherford Guys, MD Primary Care at [place], [facility]              spironolactone (ALDACTONE) 50 MG tablet [Pharmacy Med Name: SPIRONOLACTONE  50 MG TAB] 90 tablet 3    Sig: TAKE 1 TABLET BY MOUTH ONCE A DAY      Cardiovascular: Diuretics - Aldosterone Antagonist Failed - 09/20/2019 12:38 PM      Failed - Cr in normal range and within 360 days    Creat  Date Value Ref Range Status  03/27/2016 0.89 0.50 - 0.99 mg/dL Final    Comment:      For patients > or = 70 years of age: The upper reference limit for Creatinine is approximately 13% higher for people identified as African-American.      Creatinine, Ser  Date Value Ref Range Status  02/10/2019 1.05 (H) 0.57 - 1.00 mg/dL Final          Passed - K in normal range and within 360 days    Potassium  Date Value Ref Range Status  02/10/2019 4.8 3.5 - 5.2 mmol/L Final  Passed - Na in normal range and within 360 days    Sodium  Date Value Ref Range Status  02/10/2019 140 134 - 144 mmol/L Final          Passed - Last BP in normal range    BP Readings from Last 1 Encounters:  06/09/19 138/85          Passed - Valid encounter within last 6 months    Recent Outpatient Visits           3 months ago Essential hypertension, benign   [facility], Lilia Argue, MD   6 months ago DOE (dyspnea on exertion)   [facility], Lilia Argue, MD   7 months ago Anxiety and depression   [facility], Lilia Argue, MD   11 months ago Medicare annual wellness visit, subsequent   Primary Care at [facility], Arlie Solomons, MD   1 year ago Acute cystitis without hematuria   [facility], Delfino Lovett, NP       Future Appointments             In 2 months Rutherford Guys, MD Primary Care at [place], [facility]

## 2019-10-10 ENCOUNTER — Other Ambulatory Visit: Payer: Self-pay | Admitting: Family Medicine

## 2019-10-10 DIAGNOSIS — F419 Anxiety disorder, unspecified: Secondary | ICD-10-CM

## 2019-10-11 DIAGNOSIS — H401132 Primary open-angle glaucoma, bilateral, moderate stage: Secondary | ICD-10-CM | POA: Diagnosis not present

## 2019-10-19 DIAGNOSIS — H04123 Dry eye syndrome of bilateral lacrimal glands: Secondary | ICD-10-CM | POA: Diagnosis not present

## 2019-10-19 DIAGNOSIS — H401132 Primary open-angle glaucoma, bilateral, moderate stage: Secondary | ICD-10-CM | POA: Diagnosis not present

## 2019-10-26 ENCOUNTER — Ambulatory Visit (INDEPENDENT_AMBULATORY_CARE_PROVIDER_SITE_OTHER): Payer: PPO | Admitting: Family Medicine

## 2019-10-26 VITALS — BP 138/85 | Ht 60.5 in | Wt 140.0 lb

## 2019-10-26 DIAGNOSIS — Z1211 Encounter for screening for malignant neoplasm of colon: Secondary | ICD-10-CM | POA: Diagnosis not present

## 2019-10-26 NOTE — Progress Notes (Signed)
Presents today for [employer] Visit   Date of last exam:  06/09/2019  Interpreter used for this visit? No  I connected with  Maria Sweeney on 10/26/19 by telephone  and verified that I am speaking with the correct person using two identifiers.   I discussed the limitations of evaluation and management by telemedicine. The patient expressed understanding and agreed to proceed.    Patient Care Team: Rutherford Guys, MD as PCP - General (Family Medicine)   Other items to address today:   Discussed immunization Discussed Eye/Dental    Other Screening: Last screening for diabetes:02/10/2019 Last lipid screening  02/10/2019   ADVANCE DIRECTIVES: Discussed: yes On File: no Materials Provided: yes  Immunization status:  Immunization History  Administered Date(s) Administered  . Fluad Quad(high Dose 65+) 02/10/2019  . Influenza,inj,Quad PF,6+ Mos 02/22/2015, 02/11/2016, 02/10/2017, 02/28/2018  . PFIZER SARS-COV-2 Vaccination 07/27/2019, 08/22/2019  . Pneumococcal Conjugate-13 04/26/2015  . Pneumococcal Polysaccharide-23 05/12/2016     Health Maintenance Due  Topic Date Due  . TETANUS/TDAP  Never done  . COLONOSCOPY  06/19/2018     Functional Status Survey: Is the patient deaf or have difficulty hearing?: No Does the patient have difficulty seeing, even when wearing glasses/contacts?: No Does the patient have difficulty concentrating, remembering, or making decisions?: No Does the patient have difficulty walking or climbing stairs?: No Does the patient have difficulty dressing or bathing?: No Does the patient have difficulty doing errands alone such as visiting a doctor's office or shopping?: No   6CIT Screen 10/26/2019 02/10/2019 10/25/2018 06/18/2017  What Year? 0 points 0 points 0 points 0 points  What month? 0 points 0 points 0 points 0 points  What time? 0 points 0 points 0 points 0 points  Count back from 20 0 points 0 points 0 points 0  points  Months in reverse 0 points 0 points 0 points 0 points  Repeat phrase 0 points 0 points 0 points 6 points  Total Score 0 0 0 6        Clinical Support from 10/26/2019 in [facility] [place]  AUDIT-C Score 0       Home Environment:   Lives in one story home No troubling climbing stairs Yes scattered rugs (grabbers on bottom) Yes grab bars Adequate lighting / no clutter    Patient Active Problem List   Diagnosis Date Noted  . Vitamin D deficiency 08/02/2018  . Prediabetes 08/02/2018  . Carpal tunnel syndrome 02/10/2018  . Tinnitus 06/25/2016  . Numbness 04/13/2016  . Loss of smell 04/13/2016  . Pure hypercholesterolemia 11/30/2014  . Degenerative disc disease, cervical 04/24/2014  . Essential hypertension, benign 10/19/2013  . Insomnia 10/19/2013  . Herpes labialis 10/19/2013  . Anxiety and depression 07/25/2012  . GERD (gastroesophageal reflux disease) 07/25/2012     Past Medical History:  Diagnosis Date  . Anxiety   . Cataract    Phreesia 10/25/2019  . Cervical spondylosis   . Depression   . GERD (gastroesophageal reflux disease)   . Glaucoma    Phreesia 10/25/2019  . Hyperlipidemia   . Hypertension   . IBS (irritable bowel syndrome)    Constipation with diarrhea.  . Oral herpes      Past Surgical History:  Procedure Laterality Date  . ABDOMINAL HYSTERECTOMY  1978   DUB; ovaries intact.  . APPENDECTOMY  1988  . BREAST BIOPSY Bilateral 2009   Calcium Deposit- 1980's and also in 2009/ Right side  1995  . BREAST EXCISIONAL BIOPSY Bilateral   . BREAST SURGERY N/A    Phreesia 10/25/2019  . COLONOSCOPY  05/11/2009   Normal.  Previous colonoscopy with polyps. [facility].    . DEBRIDEMENT TENNIS ELBOW    . ROTATOR CUFF REPAIR       Family History  Problem Relation Age of Onset  . Hyperlipidemia Mother   . Hypertension Mother   . Cancer Mother        lung  . Diabetes Mother   . Glaucoma Mother   . Stroke Mother 75  . Heart disease Mother     . Asthma Father   . Emphysema Father   . COPD Father   . Glaucoma Father   . Hypertension Sister   . Breast cancer Paternal Grandmother      Social History   Socioeconomic History  . Marital status: Divorced    Spouse name: Not on file  . Number of children: 2  . Years of education: Bachelors  . Highest education level: Not on file  Occupational History  . Occupation: Chemical engineer: [employer]    Comment: also works at [employer]  Tobacco Use  . Smoking status: Never Smoker  . Smokeless tobacco: Never Used  Vaping Use  . Vaping Use: Never used  Substance and Sexual Activity  . Alcohol use: No  . Drug use: No  . Sexual activity: Yes    Partners: Male    Birth control/protection: Surgical, Post-menopausal  Other Topics Concern  . Not on file  Social History Narrative   Marital status: divorced since 1999; dating seriously in 2019 since 2015.      Children: one son, one daughter; 2 grandchildren.      Lives: alone in house with 2 dogs     Employment:  Retired.  Needs to work in 2019.      Tobacco: none      Alcohol: rare      Drugs: none      Exercise:  Sporadic   Right-handed.   Occasional caffeine use.      ADLs: independent with ADLs; drives; no assistant devices.      Advanced Directives: none; FULL CODE.   Social Determinants of Health   Financial Resource Strain:   . Difficulty of Paying Living Expenses:   Food Insecurity:   . Worried About Charity fundraiser in the Last Year:   . Arboriculturist in the Last Year:   Transportation Needs:   . Film/video editor (Medical):   Marland Kitchen Lack of Transportation (Non-Medical):   Physical Activity:   . Days of Exercise per Week:   . Minutes of Exercise per Session:   Stress:   . Feeling of Stress :   Social Connections:   . Frequency of Communication with Friends and Family:   . Frequency of Social Gatherings with Friends and Family:   . Attends Religious Services:   . Active Member of Clubs or  Organizations:   . Attends Archivist Meetings:   Marland Kitchen Marital Status:   Intimate Partner Violence:   . Fear of Current or Ex-Partner:   . Emotionally Abused:   Marland Kitchen Physically Abused:   . Sexually Abused:      Allergies  Allergen Reactions  . Amoxicillin   . Augmentin [Amoxicillin-Pot Clavulanate]   . Codeine   . Depakote [Divalproex Sodium]   . Latex   . Lipitor [Atorvastatin]   . Penicillins   .  Septra [Sulfamethoxazole-Trimethoprim]   . Zocor [Simvastatin]      Prior to Admission medications   Medication Sig Start Date End Date Taking? Authorizing Provider  albuterol (PROVENTIL HFA;VENTOLIN HFA) 108 (90 Base) MCG/ACT inhaler Inhale 2 puffs into the lungs every 4 (four) hours as needed for wheezing or shortness of breath (cough, shortness of breath or wheezing.). 02/11/16  Yes Wardell Honour, MD  dorzolamide (TRUSOPT) 2 % ophthalmic solution Place 1 drop into both eyes 2 (two) times daily.   Yes [provider]  FLUoxetine (PROZAC) 20 MG capsule TAKE 3 CAPSULES BY MOUTH DAILY 10/10/19  Yes Rutherford Guys, MD  latanoprost (XALATAN) 0.005 % ophthalmic solution 1 drop at bedtime.   Yes [provider]  pantoprazole (PROTONIX) 40 MG tablet TAKE 1 TABLET BY MOUTH ONCE A DAY 10/10/19  Yes Rutherford Guys, MD  rosuvastatin (CRESTOR) 10 MG tablet TAKE 1 TABLET BY MOUTH ONCE A DAY 09/20/19  Yes Rutherford Guys, MD  spironolactone (ALDACTONE) 50 MG tablet TAKE 1 TABLET BY MOUTH ONCE A DAY 09/20/19  Yes Rutherford Guys, MD  valACYclovir (VALTREX) 1000 MG tablet Take 1 tablet (1,000 mg total) by mouth 2 (two) times daily. Patient taking differently: Take 1,000 mg by mouth as needed.  06/30/17  Yes Wardell Honour, MD  mirtazapine (REMERON) 7.5 MG tablet Take 1 tablet (7.5 mg total) by mouth at bedtime. Patient not taking: Reported on 10/26/2019 03/10/19   Rutherford Guys, MD     Depression screen [facility] 2/9 10/26/2019 03/10/2019 10/25/2018 08/29/2018 06/30/2017    Decreased Interest 0 1 0 0 3  Down, Depressed, Hopeless 0 1 0 0 1  PHQ - 2 Score 0 2 0 0 4  Altered sleeping - 3 - - 3  Tired, decreased energy - 3 - - 1  Change in appetite - 1 - - 1  Feeling bad or failure about yourself  - 2 - - 1  Trouble concentrating - 2 - - 1  Moving slowly or fidgety/restless - 0 - - 0  Suicidal thoughts - 0 - - 0  PHQ-9 Score - 13 - - 11  Difficult doing work/chores - - - - -  Some recent data might be hidden     Fall Risk  10/26/2019 06/09/2019 03/10/2019 10/25/2018 08/29/2018  Falls in the past year? 0 0 0 0 0  Number falls in past yr: 0 0 0 0 -  Comment - - - - -  Injury with Fall? 0 0 0 0 0  Comment - - - - -  Risk for fall due to : - - - - -  Risk for fall due to: Comment - - - - -  Follow up Falls evaluation completed;Education provided - Falls evaluation completed Falls evaluation completed;Education provided;Falls prevention discussed -      PHYSICAL EXAM: BP 138/85 Comment: not in clinic  Ht 5' 0.5" (1.537 m)   Wt 140 lb (63.5 kg)   BMI 26.89 kg/m    Wt Readings from Last 3 Encounters:  10/26/19 140 lb (63.5 kg)  06/09/19 140 lb (63.5 kg)  03/27/19 141 lb 8 oz (64.2 kg)   Screening for colon cancer - Plan: Ambulatory referral to Gastroenterology     Education/Counseling provided regarding diet and exercise, prevention of chronic diseases, smoking/tobacco cessation, if applicable, and reviewed "Covered Medicare Preventive Services."

## 2019-10-26 NOTE — Patient Instructions (Signed)
Thank you for taking time to come for your Medicare Wellness Visit. I appreciate your ongoing commitment to your health goals. Please review the following plan we discussed and let me know if I can assist you in the future.  Myria Steenbergen LPN Preventive Care 65 Years and Older, Female Preventive care refers to lifestyle choices and visits with your health care provider that can promote health and wellness. This includes:  A yearly physical exam. This is also called an annual well check.  Regular dental and eye exams.  Immunizations.  Screening for certain conditions.  Healthy lifestyle choices, such as diet and exercise. What can I expect for my preventive care visit? Physical exam Your health care provider will check:  Height and weight. These may be used to calculate body mass index (BMI), which is a measurement that tells if you are at a healthy weight.  Heart rate and blood pressure.  Your skin for abnormal spots. Counseling Your health care provider may ask you questions about:  Alcohol, tobacco, and drug use.  Emotional well-being.  Home and relationship well-being.  Sexual activity.  Eating habits.  History of falls.  Memory and ability to understand (cognition).  Work and work environment.  Pregnancy and menstrual history. What immunizations do I need?  Influenza (flu) vaccine  This is recommended every year. Tetanus, diphtheria, and pertussis (Tdap) vaccine  You may need a Td booster every 10 years. Varicella (chickenpox) vaccine  You may need this vaccine if you have not already been vaccinated. Zoster (shingles) vaccine  You may need this after age 60. Pneumococcal conjugate (PCV13) vaccine  One dose is recommended after age 65. Pneumococcal polysaccharide (PPSV23) vaccine  One dose is recommended after age 65. Measles, mumps, and rubella (MMR) vaccine  You may need at least one dose of MMR if you were born in 1957 or later. You may also need  a second dose. Meningococcal conjugate (MenACWY) vaccine  You may need this if you have certain conditions. Hepatitis A vaccine  You may need this if you have certain conditions or if you travel or work in places where you may be exposed to hepatitis A. Hepatitis B vaccine  You may need this if you have certain conditions or if you travel or work in places where you may be exposed to hepatitis B. Haemophilus influenzae type b (Hib) vaccine  You may need this if you have certain conditions. You may receive vaccines as individual doses or as more than one vaccine together in one shot (combination vaccines). Talk with your health care provider about the risks and benefits of combination vaccines. What tests do I need? Blood tests  Lipid and cholesterol levels. These may be checked every 5 years, or more frequently depending on your overall health.  Hepatitis C test.  Hepatitis B test. Screening  Lung cancer screening. You may have this screening every year starting at age 55 if you have a 30-pack-year history of smoking and currently smoke or have quit within the past 15 years.  Colorectal cancer screening. All adults should have this screening starting at age 50 and continuing until age 75. Your health care provider may recommend screening at age 45 if you are at increased risk. You will have tests every 1-10 years, depending on your results and the type of screening test.  Diabetes screening. This is done by checking your blood sugar (glucose) after you have not eaten for a while (fasting). You may have this done every 1-3 years.    Mammogram. This may be done every 1-2 years. Talk with your health care provider about how often you should have regular mammograms.  BRCA-related cancer screening. This may be done if you have a family history of breast, ovarian, tubal, or peritoneal cancers. Other tests  Sexually transmitted disease (STD) testing.  Bone density scan. This is done to  screen for osteoporosis. You may have this done starting at age 31. Follow these instructions at home: Eating and drinking  Eat a diet that includes fresh fruits and vegetables, whole grains, lean protein, and low-fat dairy products. Limit your intake of foods with high amounts of sugar, saturated fats, and salt.  Take vitamin and mineral supplements as recommended by your health care provider.  Do not drink alcohol if your health care provider tells you not to drink.  If you drink alcohol: ? Limit how much you have to 0-1 drink a day. ? Be aware of how much alcohol is in your drink. In the [place] one drink equals one 12 oz bottle of beer (355 mL), one 5 oz glass of wine (148 mL), or one 1 oz glass of hard liquor (44 mL). Lifestyle  Take daily care of your teeth and gums.  Stay active. Exercise for at least 30 minutes on 5 or more days each week.  Do not use any products that contain nicotine or tobacco, such as cigarettes, e-cigarettes, and chewing tobacco. If you need help quitting, ask your health care provider.  If you are sexually active, practice safe sex. Use a condom or other form of protection in order to prevent STIs (sexually transmitted infections).  Talk with your health care provider about taking a low-dose aspirin or statin. What's next?  Go to your health care provider once a year for a well check visit.  Ask your health care provider how often you should have your eyes and teeth checked.  Stay up to date on all vaccines. This information is not intended to replace advice given to you by your health care provider. Make sure you discuss any questions you have with your health care provider. Document Revised: 04/21/2018 Document Reviewed: 04/21/2018 Elsevier Patient Education  2020 [employer].

## 2019-11-16 ENCOUNTER — Other Ambulatory Visit: Payer: Self-pay | Admitting: Family Medicine

## 2019-11-16 DIAGNOSIS — F419 Anxiety disorder, unspecified: Secondary | ICD-10-CM

## 2019-12-04 ENCOUNTER — Other Ambulatory Visit: Payer: Self-pay

## 2019-12-04 ENCOUNTER — Telehealth (INDEPENDENT_AMBULATORY_CARE_PROVIDER_SITE_OTHER): Payer: Self-pay | Admitting: Gastroenterology

## 2019-12-04 DIAGNOSIS — Z1211 Encounter for screening for malignant neoplasm of colon: Secondary | ICD-10-CM

## 2019-12-04 MED ORDER — NA SULFATE-K SULFATE-MG SULF 17.5-3.13-1.6 GM/177ML PO SOLN: 1.0000 | Freq: Once | ORAL | 0 refills | Status: AC

## 2019-12-04 NOTE — Progress Notes (Signed)
Gastroenterology Pre-Procedure Review  Request Date: Wed 12/20/19 Requesting Physician: Dr. Marius Ditch  Please note patient awakened during her last two colonoscopy's.  She stated this was very uncomfortable.  I told her I would note this in her triage and I've notified the Endoscopy Dept.  PATIENT REVIEW QUESTIONS: The patient responded to the following health history questions as indicated:    1. Are you having any GI issues? no 2. Do you have a personal history of Polyps? no 3. Do you have a family history of Colon Cancer or Polyps? yes (maternal aunt colon cancer, and family history of diverticulitis) 4. Diabetes Mellitus? no 5. Joint replacements in the past 12 months?no 6. Major health problems in the past 3 months?no 7. Any artificial heart valves, MVP, or defibrillator?no    MEDICATIONS & ALLERGIES:    Patient reports the following regarding taking any anticoagulation/antiplatelet therapy:   Plavix, Coumadin, Eliquis, Xarelto, Lovenox, Pradaxa, Brilinta, or Effient? no Aspirin? no  Patient confirms/reports the following medications:  Current Outpatient Medications  Medication Sig Dispense Refill   albuterol (PROVENTIL HFA;VENTOLIN HFA) 108 (90 Base) MCG/ACT inhaler Inhale 2 puffs into the lungs every 4 (four) hours as needed for wheezing or shortness of breath (cough, shortness of breath or wheezing.). 1 Inhaler 1   dorzolamide (TRUSOPT) 2 % ophthalmic solution Place 1 drop into both eyes 2 (two) times daily.     FLUoxetine (PROZAC) 20 MG capsule TAKE 3 CAPSULES BY MOUTH DAILY 90 capsule 0   latanoprost (XALATAN) 0.005 % ophthalmic solution 1 drop at bedtime.     pantoprazole (PROTONIX) 40 MG tablet TAKE 1 TABLET BY MOUTH ONCE A DAY 90 tablet 3   rosuvastatin (CRESTOR) 10 MG tablet TAKE 1 TABLET BY MOUTH ONCE A DAY 90 tablet 0   spironolactone (ALDACTONE) 50 MG tablet TAKE 1 TABLET BY MOUTH ONCE A DAY 90 tablet 0   valACYclovir (VALTREX) 1000 MG tablet Take 1 tablet (1,000  mg total) by mouth 2 (two) times daily. (Patient taking differently: Take 1,000 mg by mouth as needed. ) 30 tablet 1   mirtazapine (REMERON) 7.5 MG tablet Take 1 tablet (7.5 mg total) by mouth at bedtime. (Patient not taking: Reported on 10/26/2019) 30 tablet 2   Na Sulfate-K Sulfate-Mg Sulf 17.5-3.13-1.6 GM/177ML SOLN Take 1 kit by mouth once for 1 dose. 354 mL 0   No current facility-administered medications for this visit.    Patient confirms/reports the following allergies:  Allergies  Allergen Reactions   Amoxicillin    Augmentin [Amoxicillin-Pot Clavulanate]    Codeine    Depakote [Divalproex Sodium]    Latex    Lipitor [Atorvastatin]    Penicillins    Septra [Sulfamethoxazole-Trimethoprim]    Zocor [Simvastatin]     No orders of the defined types were placed in this encounter.   AUTHORIZATION INFORMATION Primary Insurance: 1D#: Group #:  Secondary Insurance: 1D#: Group #:  SCHEDULE INFORMATION: Date: 12/20/19 Time: Location:[facility]

## 2019-12-07 ENCOUNTER — Ambulatory Visit (INDEPENDENT_AMBULATORY_CARE_PROVIDER_SITE_OTHER): Payer: PPO | Admitting: Family Medicine

## 2019-12-07 ENCOUNTER — Encounter: Payer: Self-pay | Admitting: Family Medicine

## 2019-12-07 ENCOUNTER — Other Ambulatory Visit: Payer: Self-pay

## 2019-12-07 VITALS — BP 127/82 | HR 68 | Temp 97.9°F | Ht 60.5 in | Wt 142.6 lb

## 2019-12-07 DIAGNOSIS — R7303 Prediabetes: Secondary | ICD-10-CM

## 2019-12-07 DIAGNOSIS — F419 Anxiety disorder, unspecified: Secondary | ICD-10-CM

## 2019-12-07 DIAGNOSIS — R200 Anesthesia of skin: Secondary | ICD-10-CM | POA: Diagnosis not present

## 2019-12-07 DIAGNOSIS — G5622 Lesion of ulnar nerve, left upper limb: Secondary | ICD-10-CM

## 2019-12-07 DIAGNOSIS — I10 Essential (primary) hypertension: Secondary | ICD-10-CM

## 2019-12-07 DIAGNOSIS — F329 Major depressive disorder, single episode, unspecified: Secondary | ICD-10-CM | POA: Diagnosis not present

## 2019-12-07 DIAGNOSIS — E7800 Pure hypercholesterolemia, unspecified: Secondary | ICD-10-CM

## 2019-12-07 DIAGNOSIS — E559 Vitamin D deficiency, unspecified: Secondary | ICD-10-CM

## 2019-12-07 DIAGNOSIS — G5602 Carpal tunnel syndrome, left upper limb: Secondary | ICD-10-CM

## 2019-12-07 MED ORDER — ROSUVASTATIN CALCIUM 10 MG PO TABS: 10.0000 mg | ORAL_TABLET | Freq: Every day | ORAL | 2 refills | Status: DC

## 2019-12-07 MED ORDER — GABAPENTIN 100 MG PO CAPS: 100.0000 mg | ORAL_CAPSULE | Freq: Every day | ORAL | 3 refills | Status: DC

## 2019-12-07 MED ORDER — SPIRONOLACTONE 50 MG PO TABS: 50.0000 mg | ORAL_TABLET | Freq: Every day | ORAL | 2 refills | Status: DC

## 2019-12-07 MED ORDER — FLUOXETINE HCL 20 MG PO CAPS: 60.0000 mg | ORAL_CAPSULE | Freq: Every day | ORAL | 2 refills | Status: DC

## 2019-12-07 MED ORDER — MIRTAZAPINE 7.5 MG PO TABS: 7.5000 mg | ORAL_TABLET | Freq: Every evening | ORAL | 2 refills | Status: DC | PRN

## 2019-12-07 NOTE — Patient Instructions (Signed)
° ° ° °  If you have lab work done today you will be contacted with your lab results within the next 2 weeks.  If you have not heard from us then please contact us. The fastest way to get your results is to register for My Chart. ° ° °IF you received an x-ray today, you will receive an invoice from [facility]. Please contact [facility] at [phone] with questions or concerns regarding your invoice.  ° °IF you received labwork today, you will receive an invoice from [facility]. Please contact [facility] at [phone] with questions or concerns regarding your invoice.  ° °Our billing staff will not be able to assist you with questions regarding bills from these companies. ° °You will be contacted with the lab results as soon as they are available. The fastest way to get your results is to activate your My Chart account. Instructions are located on the last page of this paperwork. If you have not heard from us regarding the results in 2 weeks, please contact this office. °  ° ° ° °

## 2019-12-07 NOTE — Progress Notes (Signed)
7/29/202110:18 AM  Maria Sweeney 03-24-1951, 68 y.o., female [id number]  Chief Complaint  Patient presents with  . Hypertension    prefers to talk to doctor regarding concerns  . Depression    phq9 complete  . Anxiety    gad7 complete    HPI:   Patient is a 69 y.o. female with past medical history significant for prediabetes, viatmin D, HTN, HLP, anxiety and depression, GERD, cold soreswho presents todayfor followup  Last OV Jan 2021 - no changes scheduled for colonoscopy in aug 2021 Followed closely by optho for glaucoma phq9 and gad7 noted  Overall she is doing ok Has been doing lots gardening  Has stopped drinking decaf sweet tea Has completed covid vaccines She is taking OTC vitamin , thinks 600 units a day Having LLE big toe numbness, and pain upto her distal ant chin, sometimes it wakes her up She also has had years of constant movement in her legs, no pain, looking for cool spots Having worsening of her left carpal tunnel, using brace Right hand dominant   Wt Readings from Last 3 Encounters:  12/07/19 142 lb 9.6 oz (64.7 kg)  10/26/19 140 lb (63.5 kg)  06/09/19 140 lb (63.5 kg)   BP Readings from Last 3 Encounters:  12/07/19 127/82  10/26/19 138/85  06/09/19 138/85    Depression screen PHQ 2/9 12/07/2019 10/26/2019 03/10/2019  Decreased Interest 1 0 1  Down, Depressed, Hopeless 1 0 1  PHQ - 2 Score 2 0 2  Altered sleeping 1 - 3  Tired, decreased energy 1 - 3  Change in appetite 0 - 1  Feeling bad or failure about yourself  0 - 2  Trouble concentrating 1 - 2  Moving slowly or fidgety/restless 0 - 0  Suicidal thoughts 0 - 0  PHQ-9 Score 5 - 13  Difficult doing work/chores - - -  Some recent data might be hidden   GAD 7 : Generalized Anxiety Score 12/07/2019 04/01/2016 04/26/2015  Nervous, Anxious, on Edge '1 3 1  ' Control/stop worrying '1 3 2  ' Worry too much - different things '1 3 3  ' Trouble relaxing '1 3 3  ' Restless 1 3 0  Easily annoyed or  irritable 0 2 0  Afraid - awful might happen 0 2 1  Total GAD 7 Score '5 19 10  ' Anxiety Difficulty - Not difficult at all Not difficult at all     Fall Risk  10/26/2019 06/09/2019 03/10/2019 10/25/2018 08/29/2018  Falls in the past year? 0 0 0 0 0  Number falls in past yr: 0 0 0 0 -  Comment - - - - -  Injury with Fall? 0 0 0 0 0  Comment - - - - -  Risk for fall due to : - - - - -  Risk for fall due to: Comment - - - - -  Follow up Falls evaluation completed;Education provided - Falls evaluation completed Falls evaluation completed;Education provided;Falls prevention discussed -     Allergies  Allergen Reactions  . Amoxicillin   . Augmentin [Amoxicillin-Pot Clavulanate]   . Codeine   . Depakote [Divalproex Sodium]   . Latex   . Lipitor [Atorvastatin]   . Penicillins   . Septra [Sulfamethoxazole-Trimethoprim]   . Zocor [Simvastatin]     Prior to Admission medications   Medication Sig Start Date End Date Taking? Authorizing Provider  albuterol (PROVENTIL HFA;VENTOLIN HFA) 108 (90 Base) MCG/ACT inhaler Inhale 2 puffs into the lungs every 4 (  four) hours as needed for wheezing or shortness of breath (cough, shortness of breath or wheezing.). 02/11/16  Yes Wardell Honour, MD  dorzolamide (TRUSOPT) 2 % ophthalmic solution Place 1 drop into both eyes 2 (two) times daily.   Yes [provider]  FLUoxetine (PROZAC) 20 MG capsule TAKE 3 CAPSULES BY MOUTH DAILY 11/16/19  Yes Rutherford Guys, MD  latanoprost (XALATAN) 0.005 % ophthalmic solution 1 drop at bedtime.   Yes [provider]  mirtazapine (REMERON) 7.5 MG tablet Take 1 tablet (7.5 mg total) by mouth at bedtime. 03/10/19  Yes Rutherford Guys, MD  pantoprazole (PROTONIX) 40 MG tablet TAKE 1 TABLET BY MOUTH ONCE A DAY 10/10/19  Yes Rutherford Guys, MD  rosuvastatin (CRESTOR) 10 MG tablet TAKE 1 TABLET BY MOUTH ONCE A DAY 09/20/19  Yes Rutherford Guys, MD  spironolactone (ALDACTONE) 50 MG tablet TAKE 1 TABLET BY MOUTH  ONCE A DAY 09/20/19  Yes Rutherford Guys, MD  valACYclovir (VALTREX) 1000 MG tablet Take 1 tablet (1,000 mg total) by mouth 2 (two) times daily. Patient taking differently: Take 1,000 mg by mouth as needed.  06/30/17  Yes Wardell Honour, MD  SUPREP BOWEL PREP KIT 17.5-3.13-1.6 GM/177ML SOLN SMARTSIG:354 Milliliter(s) By Mouth As Directed Patient not taking: Reported on 12/07/2019 12/04/19   [provider]    Past Medical History:  Diagnosis Date  . Anxiety   . Cataract    Phreesia 10/25/2019  . Cervical spondylosis   . Depression   . GERD (gastroesophageal reflux disease)   . Glaucoma    Phreesia 10/25/2019  . Hyperlipidemia   . Hypertension   . IBS (irritable bowel syndrome)    Constipation with diarrhea.  . Oral herpes     Past Surgical History:  Procedure Laterality Date  . ABDOMINAL HYSTERECTOMY  1978   DUB; ovaries intact.  . APPENDECTOMY  1988  . BREAST BIOPSY Bilateral 2009   Calcium Deposit- 1980's and also in 2009/ Right side 1995  . BREAST EXCISIONAL BIOPSY Bilateral   . BREAST SURGERY N/A    Phreesia 10/25/2019  . COLONOSCOPY  05/11/2009   Normal.  Previous colonoscopy with polyps. [facility].    . DEBRIDEMENT TENNIS ELBOW    . ROTATOR CUFF REPAIR      Social History   Tobacco Use  . Smoking status: Never Smoker  . Smokeless tobacco: Never Used  Substance Use Topics  . Alcohol use: No    Family History  Problem Relation Age of Onset  . Hyperlipidemia Mother   . Hypertension Mother   . Cancer Mother        lung  . Diabetes Mother   . Glaucoma Mother   . Stroke Mother 64  . Heart disease Mother   . Asthma Father   . Emphysema Father   . COPD Father   . Glaucoma Father   . Hypertension Sister   . Breast cancer Paternal Grandmother     Review of Systems  Constitutional: Negative for chills and fever.  Respiratory: Negative for cough and shortness of breath.   Cardiovascular: Negative for chest pain, palpitations and leg swelling.    Gastrointestinal: Negative for abdominal pain, nausea and vomiting.   Per hpi  OBJECTIVE:  Today's Vitals   12/07/19 1014  BP: 127/82  Pulse: 68  Temp: 97.9 F (36.6 C)  SpO2: 96%  Weight: 142 lb 9.6 oz (64.7 kg)  Height: 5' 0.5" (1.537 m)   Body mass index is  27.39 kg/m.   Physical Exam Vitals and nursing note reviewed.  Constitutional:      Appearance: She is well-developed.  HENT:     Head: Normocephalic and atraumatic.     Mouth/Throat:     Pharynx: No oropharyngeal exudate.  Eyes:     General: No scleral icterus.    Extraocular Movements: Extraocular movements intact.     Conjunctiva/sclera: Conjunctivae normal.     Pupils: Pupils are equal, round, and reactive to light.  Cardiovascular:     Rate and Rhythm: Normal rate and regular rhythm.     Heart sounds: Normal heart sounds. No murmur heard.  No friction rub. No gallop.   Pulmonary:     Effort: Pulmonary effort is normal.     Breath sounds: Normal breath sounds. No wheezing, rhonchi or rales.  Musculoskeletal:     Cervical back: Neck supple.     Right lower leg: No edema.     Left lower leg: No edema.     Comments: Wrists FROM, no swelling LEFT + tinsel, neg phalen Elbow FROM, no swelling + elbow flexion + 2 pulses, normal strength Mild decrease sensation left 5th finger  Skin:    General: Skin is warm and dry.  Neurological:     Mental Status: She is alert and oriented to person, place, and time.     No results found for this or any previous visit (from the past 24 hour(s)).  No results found.   ASSESSMENT and PLAN  1. Essential hypertension, benign Controlled. Continue current regime.  - CMP14+EGFR - Lipid panel - TSH  2. Pure hypercholesterolemia Checking labs today, medications will be adjusted as needed.  - CMP14+EGFR - Lipid panel - TSH  3. Vitamin D deficiency Checking labs today, medications will be adjusted as needed.  - VITAMIN D 25 Hydroxy (Vit-D Deficiency,  Fractures)  4. Anxiety and depression Controlled. Continue current regime.  - FLUoxetine (PROZAC) 20 MG capsule; Take 3 capsules (60 mg total) by mouth daily.  5. Prediabetes - Hemoglobin A1c  6. Carpal tunnel syndrome of left wrist 7. Ulnar neuropathy of left upper extremity Discussed supportive measures, new meds r/se/b and RTC precautions.  8. Numbness of toes - Ambulatory referral to Podiatry  Other orders - mirtazapine (REMERON) 7.5 MG tablet; Take 1 tablet (7.5 mg total) by mouth at bedtime as needed. - rosuvastatin (CRESTOR) 10 MG tablet; Take 1 tablet (10 mg total) by mouth daily. - spironolactone (ALDACTONE) 50 MG tablet; Take 1 tablet (50 mg total) by mouth daily. - gabapentin (NEURONTIN) 100 MG capsule; Take 1-3 capsules (100-300 mg total) by mouth at bedtime.  Return in about 6 months (around 06/08/2020).    Rutherford Guys, MD [facility] [address], [place] [address] Ph.  [phone] Fax [phone]

## 2019-12-08 LAB — LIPID PANEL
Chol/HDL Ratio: 3.5 ratio (ref 0.0–4.4)
Cholesterol, Total: 202 mg/dL — ABNORMAL HIGH (ref 100–199)
HDL: 58 mg/dL (ref >39)
LDL Chol Calc (NIH): 109 mg/dL — ABNORMAL HIGH (ref 0–99)
Triglycerides: 203 mg/dL — ABNORMAL HIGH (ref 0–149)
VLDL Cholesterol Cal: 35 mg/dL (ref 5–40)

## 2019-12-08 LAB — CMP14+EGFR
ALT: 37 IU/L — ABNORMAL HIGH (ref 0–32)
AST: 29 IU/L (ref 0–40)
Albumin/Globulin Ratio: 2 (ref 1.2–2.2)
Albumin: 4.8 g/dL (ref 3.8–4.8)
Alkaline Phosphatase: 109 IU/L (ref 48–121)
BUN/Creatinine Ratio: 16 (ref 12–28)
BUN: 14 mg/dL (ref 8–27)
Bilirubin Total: 0.5 mg/dL (ref 0.0–1.2)
CO2: 21 mmol/L (ref 20–29)
Calcium: 10 mg/dL (ref 8.7–10.3)
Chloride: 103 mmol/L (ref 96–106)
Creatinine, Ser: 0.87 mg/dL (ref 0.57–1.00)
GFR calc Af Amer: 79 mL/min/{1.73_m2} (ref >59)
GFR calc non Af Amer: 68 mL/min/{1.73_m2} (ref >59)
Globulin, Total: 2.4 g/dL (ref 1.5–4.5)
Glucose: 124 mg/dL — ABNORMAL HIGH (ref 65–99)
Potassium: 4.7 mmol/L (ref 3.5–5.2)
Sodium: 140 mmol/L (ref 134–144)
Total Protein: 7.2 g/dL (ref 6.0–8.5)

## 2019-12-08 LAB — VITAMIN D 25 HYDROXY (VIT D DEFICIENCY, FRACTURES): Vit D, 25-Hydroxy: 26.6 ng/mL — ABNORMAL LOW (ref 30.0–100.0)

## 2019-12-08 LAB — HEMOGLOBIN A1C
Est. average glucose Bld gHb Est-mCnc: 137 mg/dL
Hgb A1c MFr Bld: 6.4 % — ABNORMAL HIGH (ref 4.8–5.6)

## 2019-12-08 LAB — TSH: TSH: 2.25 u[IU]/mL (ref 0.450–4.500)

## 2019-12-18 ENCOUNTER — Other Ambulatory Visit: Payer: Self-pay

## 2019-12-18 ENCOUNTER — Other Ambulatory Visit
Admission: RE | Admit: 2019-12-18 | Discharge: 2019-12-18 | Disposition: A | Discharge status: Home / Self Care | Payer: PPO | Source: Ambulatory Visit | Attending: Gastroenterology | Admitting: Gastroenterology

## 2019-12-18 DIAGNOSIS — Z01812 Encounter for preprocedural laboratory examination: Secondary | ICD-10-CM | POA: Diagnosis not present

## 2019-12-18 DIAGNOSIS — Z20822 Contact with and (suspected) exposure to covid-19: Secondary | ICD-10-CM | POA: Insufficient documentation

## 2019-12-19 ENCOUNTER — Encounter: Payer: Self-pay | Admitting: Gastroenterology

## 2019-12-19 LAB — SARS CORONAVIRUS 2 (TAT 6-24 HRS): SARS Coronavirus 2: NEGATIVE

## 2019-12-20 ENCOUNTER — Ambulatory Visit
Admission: RE | Admit: 2019-12-20 | Discharge: 2019-12-20 | Disposition: A | Discharge status: Home / Self Care | Payer: PPO | Attending: Gastroenterology | Admitting: Gastroenterology

## 2019-12-20 ENCOUNTER — Ambulatory Visit: Payer: PPO | Admitting: Anesthesiology

## 2019-12-20 ENCOUNTER — Encounter
Admission: RE | Disposition: A | Discharge status: Home / Self Care | Payer: Self-pay | Source: Home / Self Care | Attending: Gastroenterology

## 2019-12-20 ENCOUNTER — Encounter: Payer: Self-pay | Admitting: Gastroenterology

## 2019-12-20 DIAGNOSIS — K219 Gastro-esophageal reflux disease without esophagitis: Secondary | ICD-10-CM | POA: Diagnosis not present

## 2019-12-20 DIAGNOSIS — I10 Essential (primary) hypertension: Secondary | ICD-10-CM | POA: Insufficient documentation

## 2019-12-20 DIAGNOSIS — Z1211 Encounter for screening for malignant neoplasm of colon: Secondary | ICD-10-CM | POA: Insufficient documentation

## 2019-12-20 DIAGNOSIS — H409 Unspecified glaucoma: Secondary | ICD-10-CM | POA: Insufficient documentation

## 2019-12-20 DIAGNOSIS — E785 Hyperlipidemia, unspecified: Secondary | ICD-10-CM | POA: Insufficient documentation

## 2019-12-20 DIAGNOSIS — D128 Benign neoplasm of rectum: Secondary | ICD-10-CM | POA: Diagnosis not present

## 2019-12-20 DIAGNOSIS — F419 Anxiety disorder, unspecified: Secondary | ICD-10-CM | POA: Insufficient documentation

## 2019-12-20 DIAGNOSIS — Z79899 Other long term (current) drug therapy: Secondary | ICD-10-CM | POA: Diagnosis not present

## 2019-12-20 DIAGNOSIS — K621 Rectal polyp: Secondary | ICD-10-CM | POA: Diagnosis not present

## 2019-12-20 DIAGNOSIS — K635 Polyp of colon: Secondary | ICD-10-CM | POA: Diagnosis not present

## 2019-12-20 DIAGNOSIS — K5730 Diverticulosis of large intestine without perforation or abscess without bleeding: Secondary | ICD-10-CM | POA: Insufficient documentation

## 2019-12-20 DIAGNOSIS — F329 Major depressive disorder, single episode, unspecified: Secondary | ICD-10-CM | POA: Diagnosis not present

## 2019-12-20 HISTORY — PX: COLONOSCOPY WITH PROPOFOL: SHX5780

## 2019-12-20 HISTORY — DX: Other complications of anesthesia, initial encounter: T88.59XA

## 2019-12-20 SURGERY — COLONOSCOPY WITH PROPOFOL
Anesthesia: General

## 2019-12-20 MED ORDER — SODIUM CHLORIDE 0.9 % IV SOLN
INTRAVENOUS | Status: DC
  Administered 2019-12-20: 1000 mL via INTRAVENOUS

## 2019-12-20 MED ORDER — PROPOFOL 500 MG/50ML IV EMUL
INTRAVENOUS | Status: AC
  Filled 2019-12-20: qty 50

## 2019-12-20 MED ORDER — PROPOFOL 500 MG/50ML IV EMUL
INTRAVENOUS | Status: DC | PRN
  Administered 2019-12-20: 150 ug/kg/min via INTRAVENOUS

## 2019-12-20 MED ORDER — GLYCOPYRROLATE 0.2 MG/ML IJ SOLN
INTRAMUSCULAR | Status: DC | PRN
  Administered 2019-12-20: .2 mg via INTRAVENOUS

## 2019-12-20 MED ORDER — PROPOFOL 10 MG/ML IV BOLUS
INTRAVENOUS | Status: DC | PRN
  Administered 2019-12-20: 50 mg via INTRAVENOUS

## 2019-12-20 NOTE — Op Note (Signed)
[facility] Gastroenterology Patient Name: Maria Sweeney Procedure Date: 12/20/2019 9:39 AM MRN: [id number] Account #: [id number] Date of Birth: [birth year] Admit Type: Outpatient Age: 69 Room: [facility] ENDO ROOM 4 Gender: Female Note Status: Finalized Procedure:             Colonoscopy Indications:           Screening for colorectal malignant neoplasm Providers:             Lin Landsman MD, MD Medicines:             Monitored Anesthesia Care Complications:         No immediate complications. Estimated blood loss: None. Procedure:             Pre-Anesthesia Assessment:                        - Prior to the procedure, a History and Physical was                         performed, and patient medications and allergies were                         reviewed. The patient is competent. The risks and                         benefits of the procedure and the sedation options and                         risks were discussed with the patient. All questions                         were answered and informed consent was obtained.                         Patient identification and proposed procedure were                         verified by the physician, the nurse, the                         anesthesiologist, the anesthetist and the technician                         in the pre-procedure area in the procedure room in the                         endoscopy suite. Mental Status Examination: alert and                         oriented. Airway Examination: normal oropharyngeal                         airway and neck mobility. Respiratory Examination:                         clear to auscultation. CV Examination: normal.                         Prophylactic Antibiotics: The patient does not  require                         prophylactic antibiotics. Prior Anticoagulants: The                         patient has taken no previous anticoagulant or                         antiplatelet  agents. ASA Grade Assessment: II - A                         patient with mild systemic disease. After reviewing                         the risks and benefits, the patient was deemed in                         satisfactory condition to undergo the procedure. The                         anesthesia plan was to use monitored anesthesia care                         (MAC). Immediately prior to administration of                         medications, the patient was re-assessed for adequacy                         to receive sedatives. The heart rate, respiratory                         rate, oxygen saturations, blood pressure, adequacy of                         pulmonary ventilation, and response to care were                         monitored throughout the procedure. The physical                         status of the patient was re-assessed after the                         procedure.                        After obtaining informed consent, the colonoscope was                         passed under direct vision. Throughout the procedure,                         the patient's blood pressure, pulse, and oxygen                         saturations were monitored continuously. The was  introduced through the anus and advanced to the the                         cecum, identified by appendiceal orifice and ileocecal                         valve. The colonoscopy was performed without                         difficulty. The patient tolerated the procedure well.                         The quality of the bowel preparation was evaluated                         using the BBPS [facility] Bowel Preparation Scale) with                         scores of: Right Colon = 3, Transverse Colon = 3 and                         Left Colon = 3 (entire mucosa seen well with no                         residual staining, small fragments of stool or opaque                         liquid). The total BBPS  score equals 9. Findings:      The perianal and digital rectal examinations were normal. Pertinent       negatives include normal sphincter tone and no palpable rectal lesions.      Two sessile polyps were found in the rectum. The polyps were 3 to 4 mm       in size. These polyps were removed with a cold snare. Resection and       retrieval were complete.      A few diverticula were found in the sigmoid colon.      The retroflexed view of the distal rectum and anal verge was normal and       showed no anal or rectal abnormalities. Impression:            - Two 3 to 4 mm polyps in the rectum, removed with a                         cold snare. Resected and retrieved.                        - Diverticulosis in the sigmoid colon.                        - The distal rectum and anal verge are normal on                         retroflexion view. Recommendation:        - Discharge patient to home (with escort).                        -  Resume previous diet today.                        - Continue present medications.                        - Await pathology results.                        - Repeat colonoscopy in 7-10 years for surveillance                         based on pathology results. Procedure Code(s):     --- Professional ---                        [id number], Colonoscopy, flexible; with removal of                         tumor(s), polyp(s), or other lesion(s) by snare                         technique Diagnosis Code(s):     --- Professional ---                        Z12.11, Encounter for screening for malignant neoplasm                         of colon                        K62.1, Rectal polyp                        K57.30, Diverticulosis of large intestine without                         perforation or abscess without bleeding CPT copyright 2019 American Medical Association. All rights reserved. The codes documented in this report are preliminary and upon coder review may  be revised  to meet current compliance requirements. Dr. Ulyess Mort Lin Landsman MD, MD 12/20/2019 10:08:43 AM This report has been signed electronically. Number of Addenda: 0 Note Initiated On: 12/20/2019 9:39 AM Scope Withdrawal Time: 0 hours 9 minutes 31 seconds  Total Procedure Duration: 0 hours 15 minutes 27 seconds  Estimated Blood Loss:  Estimated blood loss: none.      [facility]

## 2019-12-20 NOTE — Anesthesia Postprocedure Evaluation (Signed)
Anesthesia Post Note  Patient: Maria Sweeney  Procedure(s) Performed: COLONOSCOPY WITH PROPOFOL (N/A )  Patient location during evaluation: Endoscopy Anesthesia Type: General Level of consciousness: awake and alert and oriented Pain management: pain level controlled Vital Signs Assessment: post-procedure vital signs reviewed and stable Respiratory status: spontaneous breathing, nonlabored ventilation and respiratory function stable Cardiovascular status: blood pressure returned to baseline and stable Postop Assessment: no signs of nausea or vomiting Anesthetic complications: no   No complications documented.   Last Vitals:  Vitals:   12/20/19 1030 12/20/19 1040  BP: 125/83 (!) 138/95  Pulse: 73 69  Resp: (!) 21 19  Temp:    SpO2: 97% 99%    Last Pain:  Vitals:   12/20/19 1040  TempSrc:   PainSc: 2                  Meridith Romick

## 2019-12-20 NOTE — Anesthesia Preprocedure Evaluation (Signed)
Anesthesia Evaluation  Patient identified by MRN, date of birth, ID band Patient awake    Reviewed: Allergy & Precautions, NPO status , Patient's Chart, lab work & pertinent test results  History of Anesthesia Complications History of anesthetic complications: woke up during colonoscopy.  Airway Mallampati: II  TM Distance: >3 FB Neck ROM: Full    Dental no notable dental hx.    Pulmonary neg pulmonary ROS, neg sleep apnea, neg COPD,    breath sounds clear to auscultation- rhonchi (-) wheezing      Cardiovascular Exercise Tolerance: Good hypertension, Pt. on medications (-) CAD, (-) Past MI, (-) Cardiac Stents and (-) CABG  Rhythm:Regular Rate:Normal - Systolic murmurs and - Diastolic murmurs    Neuro/Psych neg Seizures PSYCHIATRIC DISORDERS Anxiety Depression negative neurological ROS     GI/Hepatic Neg liver ROS, GERD  ,  Endo/Other  negative endocrine ROSneg diabetes  Renal/GU negative Renal ROS     Musculoskeletal  (+) Arthritis ,   Abdominal (+) - obese,   Peds  Hematology negative hematology ROS (+)   Anesthesia Other Findings Past Medical History: No date: Anxiety No date: Cataract     Comment:  Phreesia 10/25/2019 No date: Cervical spondylosis No date: Complication of anesthesia No date: Depression No date: GERD (gastroesophageal reflux disease) No date: Glaucoma     Comment:  Phreesia 10/25/2019 No date: Hyperlipidemia No date: Hypertension No date: IBS (irritable bowel syndrome)     Comment:  Constipation with diarrhea. No date: Oral herpes   Reproductive/Obstetrics                             Anesthesia Physical Anesthesia Plan  ASA: II  Anesthesia Plan: General   Post-op Pain Management:    Induction: Intravenous  PONV Risk Score and Plan: 2 and Propofol infusion  Airway Management Planned: Natural Airway  Additional Equipment:   Intra-op Plan:    Post-operative Plan:   Informed Consent: I have reviewed the patients History and Physical, chart, labs and discussed the procedure including the risks, benefits and alternatives for the proposed anesthesia with the patient or authorized representative who has indicated his/her understanding and acceptance.     Dental advisory given  Plan Discussed with: CRNA and Anesthesiologist  Anesthesia Plan Comments:         Anesthesia Quick Evaluation

## 2019-12-20 NOTE — Transfer of Care (Signed)
Immediate Anesthesia Transfer of Care Note  Patient: Maria Sweeney  Procedure(s) Performed: COLONOSCOPY WITH PROPOFOL (N/A )  Patient Location: PACU and Endoscopy Unit  Anesthesia Type:General  Level of Consciousness: drowsy  Airway & Oxygen Therapy: Patient Spontanous Breathing  Post-op Assessment: Report given to RN  Post vital signs: stable  Last Vitals:  Vitals Value Taken Time  BP    Temp    Pulse    Resp    SpO2      Last Pain:  Vitals:   12/20/19 0925  TempSrc: Temporal  PainSc: 0-No pain         Complications: No complications documented.

## 2019-12-20 NOTE — H&P (Signed)
Cephas Darby, MD [address]  [place]  [place], [place] [address]  Main: [phone]  Fax: [phone] Pager: [phone]  Primary Care Physician:  Rutherford Guys, MD Primary Gastroenterologist:  Dr. Cephas Darby  Pre-Procedure History & Physical: HPI:  Maria Sweeney is a 69 y.o. female is here for an colonoscopy.   Past Medical History:  Diagnosis Date  . Anxiety   . Cataract    Phreesia 10/25/2019  . Cervical spondylosis   . Complication of anesthesia   . Depression   . GERD (gastroesophageal reflux disease)   . Glaucoma    Phreesia 10/25/2019  . Hyperlipidemia   . Hypertension   . IBS (irritable bowel syndrome)    Constipation with diarrhea.  . Oral herpes     Past Surgical History:  Procedure Laterality Date  . ABDOMINAL HYSTERECTOMY  1978   DUB; ovaries intact.  . APPENDECTOMY  1988  . BREAST BIOPSY Bilateral 2009   Calcium Deposit- 1980's and also in 2009/ Right side 1995  . BREAST EXCISIONAL BIOPSY Bilateral   . BREAST SURGERY N/A    Phreesia 10/25/2019  . COLONOSCOPY  05/11/2009   Normal.  Previous colonoscopy with polyps. [facility].    . DEBRIDEMENT TENNIS ELBOW    . ROTATOR CUFF REPAIR    . TONSILLECTOMY      Prior to Admission medications   Medication Sig Start Date End Date Taking? Authorizing Provider  dorzolamide (TRUSOPT) 2 % ophthalmic solution Place 1 drop into both eyes 2 (two) times daily.   Yes [provider]  FLUoxetine (PROZAC) 20 MG capsule Take 3 capsules (60 mg total) by mouth daily. 12/07/19  Yes Rutherford Guys, MD  latanoprost (XALATAN) 0.005 % ophthalmic solution 1 drop at bedtime.   Yes [provider]  Omega-3 Fatty Acids (FISH OIL OMEGA-3) 1000 MG CAPS Take by mouth.   Yes [provider]  pantoprazole (PROTONIX) 40 MG tablet TAKE 1 TABLET BY MOUTH ONCE A DAY 10/10/19  Yes Rutherford Guys, MD  rosuvastatin (CRESTOR) 10 MG tablet Take 1 tablet (10 mg total) by mouth daily. 12/07/19  Yes  Rutherford Guys, MD  spironolactone (ALDACTONE) 50 MG tablet Take 1 tablet (50 mg total) by mouth daily. 12/07/19  Yes Rutherford Guys, MD  SUPREP BOWEL PREP KIT 17.5-3.13-1.6 GM/177ML SOLN SMARTSIG:354 Milliliter(s) By Mouth As Directed 12/04/19  Yes [provider]  VITAMIN D PO Take by mouth.   Yes [provider]  albuterol (PROVENTIL HFA;VENTOLIN HFA) 108 (90 Base) MCG/ACT inhaler Inhale 2 puffs into the lungs every 4 (four) hours as needed for wheezing or shortness of breath (cough, shortness of breath or wheezing.). 02/11/16   Wardell Honour, MD  gabapentin (NEURONTIN) 100 MG capsule Take 1-3 capsules (100-300 mg total) by mouth at bedtime. Patient not taking: Reported on 12/20/2019 12/07/19   Rutherford Guys, MD  mirtazapine (REMERON) 7.5 MG tablet Take 1 tablet (7.5 mg total) by mouth at bedtime as needed. 12/07/19   Rutherford Guys, MD  valACYclovir (VALTREX) 1000 MG tablet Take 1 tablet (1,000 mg total) by mouth 2 (two) times daily. Patient taking differently: Take 1,000 mg by mouth as needed.  06/30/17   Wardell Honour, MD    Allergies as of 12/04/2019 - Review Complete 12/04/2019  Allergen Reaction Noted  . Amoxicillin  12/24/2011  . Augmentin [amoxicillin-pot clavulanate]  12/24/2011  . Codeine  12/24/2011  . Depakote [divalproex sodium]  12/24/2011  .  Latex  12/24/2011  . Lipitor [atorvastatin]  12/24/2011  . Penicillins  12/24/2011  . Septra [sulfamethoxazole-trimethoprim]  12/24/2011  . Zocor [simvastatin]  12/24/2011    Family History  Problem Relation Age of Onset  . Hyperlipidemia Mother   . Hypertension Mother   . Cancer Mother        lung  . Diabetes Mother   . Glaucoma Mother   . Stroke Mother 59  . Heart disease Mother   . Asthma Father   . Emphysema Father   . COPD Father   . Glaucoma Father   . Hypertension Sister   . Breast cancer Paternal Grandmother     Social History   Socioeconomic History  . Marital status: Divorced     Spouse name: Not on file  . Number of children: 2  . Years of education: Bachelors  . Highest education level: Not on file  Occupational History  . Occupation: Chemical engineer: [employer]    Comment: also works at [employer]  Tobacco Use  . Smoking status: Never Smoker  . Smokeless tobacco: Never Used  Vaping Use  . Vaping Use: Never used  Substance and Sexual Activity  . Alcohol use: No  . Drug use: Never  . Sexual activity: Yes    Partners: Male    Birth control/protection: Surgical, Post-menopausal  Other Topics Concern  . Not on file  Social History Narrative   Marital status: divorced since 1999; dating seriously in 2019 since 2015.      Children: one son, one daughter; 2 grandchildren.      Lives: alone in house with 2 dogs     Employment:  Retired.  Needs to work in 2019.      Tobacco: none      Alcohol: rare      Drugs: none      Exercise:  Sporadic   Right-handed.   Occasional caffeine use.      ADLs: independent with ADLs; drives; no assistant devices.      Advanced Directives: none; FULL CODE.   Social Determinants of Health   Financial Resource Strain:   . Difficulty of Paying Living Expenses:   Food Insecurity:   . Worried About Charity fundraiser in the Last Year:   . Arboriculturist in the Last Year:   Transportation Needs:   . Film/video editor (Medical):   Marland Kitchen Lack of Transportation (Non-Medical):   Physical Activity:   . Days of Exercise per Week:   . Minutes of Exercise per Session:   Stress:   . Feeling of Stress :   Social Connections:   . Frequency of Communication with Friends and Family:   . Frequency of Social Gatherings with Friends and Family:   . Attends Religious Services:   . Active Member of Clubs or Organizations:   . Attends Archivist Meetings:   Marland Kitchen Marital Status:   Intimate Partner Violence:   . Fear of Current or Ex-Partner:   . Emotionally Abused:   Marland Kitchen Physically Abused:   . Sexually Abused:      Review of Systems: See HPI, otherwise negative ROS  Physical Exam: BP (!) 134/91   Pulse 70   Temp (!) 97.3 F (36.3 C) (Temporal)   Resp 16   Ht 5' 0.5" (1.537 m)   Wt 63.6 kg   SpO2 96%   BMI 26.95 kg/m  General:   Alert,  pleasant and cooperative in NAD  Head:  Normocephalic and atraumatic. Neck:  Supple; no masses or thyromegaly. Lungs:  Clear throughout to auscultation.    Heart:  Regular rate and rhythm. Abdomen:  Soft, nontender and nondistended. Normal bowel sounds, without guarding, and without rebound.   Neurologic:  Alert and  oriented x4;  grossly normal neurologically.  Impression/Plan: Maria Sweeney is here for an colonoscopy to be performed for colon cancer screening  Risks, benefits, limitations, and alternatives regarding  colonoscopy have been reviewed with the patient.  Questions have been answered.  All parties agreeable.   Sherri Sear, MD  12/20/2019, 9:34 AM

## 2019-12-21 ENCOUNTER — Encounter: Payer: Self-pay | Admitting: Gastroenterology

## 2019-12-21 LAB — SURGICAL PATHOLOGY

## 2019-12-25 ENCOUNTER — Other Ambulatory Visit: Payer: Self-pay | Admitting: Family Medicine

## 2019-12-25 DIAGNOSIS — F419 Anxiety disorder, unspecified: Secondary | ICD-10-CM

## 2019-12-25 DIAGNOSIS — F329 Major depressive disorder, single episode, unspecified: Secondary | ICD-10-CM

## 2019-12-27 ENCOUNTER — Telehealth: Payer: Self-pay | Admitting: Family Medicine

## 2019-12-27 NOTE — Telephone Encounter (Signed)
Pt called in with UTI symptoms- she would prefer to not come in office due to living in [place] and would like antibiotics sent to her pharmacy. Stated that pcp has done this before. Please advise patient

## 2019-12-27 NOTE — Telephone Encounter (Signed)
Patient is calling with symptoms of an UTI, and states she would not like to come in office due to living in [place] and need some antibiotics.  Per patient this have been done for her previously.  Please Advise.

## 2019-12-28 NOTE — Telephone Encounter (Signed)
Please inform patient that I have not treated her for UTI. She has seen Dr Nolon Rod (at other office) and Orland Mustard, NP for this. I do not practice in such manner and will not send an antibiotic wo proper evaluation. She can be seen at local urgent care. thanks

## 2019-12-28 NOTE — Telephone Encounter (Signed)
Patient was informed she need to be seen in order to treat her UTI. Patient can schedule and virtual appt but still will need to drop off a urine sample in  order for [place] to treat her uti symptoms

## 2020-01-08 DIAGNOSIS — S40862A Insect bite (nonvenomous) of left upper arm, initial encounter: Secondary | ICD-10-CM | POA: Diagnosis not present

## 2020-01-08 DIAGNOSIS — D485 Neoplasm of uncertain behavior of skin: Secondary | ICD-10-CM | POA: Diagnosis not present

## 2020-01-10 ENCOUNTER — Ambulatory Visit (INDEPENDENT_AMBULATORY_CARE_PROVIDER_SITE_OTHER): Payer: PPO

## 2020-01-10 ENCOUNTER — Encounter: Payer: Self-pay | Admitting: Podiatry

## 2020-01-10 ENCOUNTER — Ambulatory Visit (INDEPENDENT_AMBULATORY_CARE_PROVIDER_SITE_OTHER): Payer: PPO | Admitting: Podiatry

## 2020-01-10 ENCOUNTER — Other Ambulatory Visit: Payer: Self-pay

## 2020-01-10 DIAGNOSIS — M778 Other enthesopathies, not elsewhere classified: Secondary | ICD-10-CM

## 2020-01-10 DIAGNOSIS — M5432 Sciatica, left side: Secondary | ICD-10-CM | POA: Diagnosis not present

## 2020-01-10 NOTE — Progress Notes (Signed)
Subjective:  Patient ID: Maria Sweeney, female    DOB: 1950-07-13,  MRN: [id number] HPI Chief Complaint  Patient presents with  . Toe Pain    Hallux left - plantarly - numbness, cold and pins/needle sensations x 3-4 months, no injury, noticing now some dorsal midfoot and shin pain  . New Patient (Initial Visit)    69 y.o. female presents with the above complaint.   ROS: Denies fever chills nausea vomiting muscle aches pains calf pain chest pain shortness of breath.  She does relate to cervical thoracic and lumbar pain particularly on the left side radiating down the back of her leg and into her foot. Past Medical History:  Diagnosis Date  . Anxiety   . Cataract    Phreesia 10/25/2019  . Cervical spondylosis   . Complication of anesthesia   . Depression   . GERD (gastroesophageal reflux disease)   . Glaucoma    Phreesia 10/25/2019  . Hyperlipidemia   . Hypertension   . IBS (irritable bowel syndrome)    Constipation with diarrhea.  . Oral herpes    Past Surgical History:  Procedure Laterality Date  . ABDOMINAL HYSTERECTOMY  1978   DUB; ovaries intact.  . APPENDECTOMY  1988  . BREAST BIOPSY Bilateral 2009   Calcium Deposit- 1980's and also in 2009/ Right side 1995  . BREAST EXCISIONAL BIOPSY Bilateral   . BREAST SURGERY N/A    Phreesia 10/25/2019  . COLONOSCOPY  05/11/2009   Normal.  Previous colonoscopy with polyps. [facility].    . COLONOSCOPY WITH PROPOFOL N/A 12/20/2019   Procedure: COLONOSCOPY WITH PROPOFOL;  Surgeon: Lin Landsman, MD;  Location: [facility] ENDOSCOPY;  Service: Gastroenterology;  Laterality: N/A;  . DEBRIDEMENT TENNIS ELBOW    . ROTATOR CUFF REPAIR    . TONSILLECTOMY      Current Outpatient Medications:  .  albuterol (PROVENTIL HFA;VENTOLIN HFA) 108 (90 Base) MCG/ACT inhaler, Inhale 2 puffs into the lungs every 4 (four) hours as needed for wheezing or shortness of breath (cough, shortness of breath or wheezing.)., Disp: 1 Inhaler, Rfl: 1 .   dorzolamide (TRUSOPT) 2 % ophthalmic solution, Place 1 drop into both eyes 2 (two) times daily., Disp: , Rfl:  .  dorzolamide-timolol (COSOPT) 22.3-6.8 MG/ML ophthalmic solution, , Disp: , Rfl:  .  FLUoxetine (PROZAC) 20 MG capsule, Take 3 capsules (60 mg total) by mouth daily., Disp: 90 capsule, Rfl: 2 .  latanoprost (XALATAN) 0.005 % ophthalmic solution, 1 drop at bedtime., Disp: , Rfl:  .  mirtazapine (REMERON) 7.5 MG tablet, Take 1 tablet (7.5 mg total) by mouth at bedtime as needed., Disp: 30 tablet, Rfl: 2 .  Omega-3 Fatty Acids (FISH OIL OMEGA-3) 1000 MG CAPS, Take by mouth., Disp: , Rfl:  .  pantoprazole (PROTONIX) 40 MG tablet, TAKE 1 TABLET BY MOUTH ONCE A DAY, Disp: 90 tablet, Rfl: 3 .  rosuvastatin (CRESTOR) 10 MG tablet, Take 1 tablet (10 mg total) by mouth daily., Disp: 90 tablet, Rfl: 2 .  spironolactone (ALDACTONE) 50 MG tablet, Take 1 tablet (50 mg total) by mouth daily., Disp: 90 tablet, Rfl: 2 .  valACYclovir (VALTREX) 1000 MG tablet, Take 1 tablet (1,000 mg total) by mouth 2 (two) times daily. (Patient taking differently: Take 1,000 mg by mouth as needed. ), Disp: 30 tablet, Rfl: 1 .  VITAMIN D PO, Take by mouth., Disp: , Rfl:   Allergies  Allergen Reactions  . Amoxicillin   . Augmentin [Amoxicillin-Pot Clavulanate]   . Codeine   .  Depakote [Divalproex Sodium]   . Latex   . Lipitor [Atorvastatin]   . Penicillins   . Septra [Sulfamethoxazole-Trimethoprim]   . Zocor [Simvastatin]    Review of Systems Objective:  There were no vitals filed for this visit.  General: Well developed, nourished, in no acute distress, alert and oriented x3   Dermatological: Skin is warm, dry and supple bilateral. Nails x 10 are well maintained; remaining integument appears unremarkable at this time. There are no open sores, no preulcerative lesions, no rash or signs of infection present.  Vascular: Dorsalis Pedis artery and Posterior Tibial artery pedal pulses are 2/4 bilateral with  immedate capillary fill time. Pedal hair growth present. No varicosities and no lower extremity edema present bilateral.   Neruologic: Grossly intact via light touch bilateral. Vibratory intact via tuning fork bilateral. Protective threshold with Semmes Wienstein monofilament intact to all pedal sites bilateral though she is slightly less responsive the tip of the toes particularly the hallux and the second toe of the left foot.. Patellar and Achilles deep tendon reflexes 2+ bilateral. No Babinski or clonus noted bilateral.  Positive straight leg test.  Musculoskeletal: No gross boney pedal deformities bilateral. No pain, crepitus, or limitation noted with foot and ankle range of motion bilateral. Muscular strength 5/5 in all groups tested bilateral.  Gait: Unassisted, Nonantalgic.    Radiographs:  Radiographs taken today demonstrate an osseously mature individual with very early osteoarthritic changes at the first metatarsal cuneiform joint.  No dorsal spurring is noted.  Assessment & Plan:   Assessment: Mononeuropathy deep peroneal nerve possibly associated with sciatica or radiculopathy of the back.  Plan: At this point we will refer her to neurosurgery for evaluation.  We discussed appropriate shoe gear and how to wear shoes that are not pressing on the deep peroneal nerve.     Fabrizio Filip T. [place], [place]

## 2020-01-10 NOTE — Addendum Note (Signed)
Addended by: Graceann Congress D on: 01/10/2020 04:56 PM   Modules accepted: Orders

## 2020-02-19 DIAGNOSIS — H401132 Primary open-angle glaucoma, bilateral, moderate stage: Secondary | ICD-10-CM | POA: Diagnosis not present

## 2020-02-29 ENCOUNTER — Other Ambulatory Visit: Payer: Self-pay | Admitting: Family Medicine

## 2020-02-29 ENCOUNTER — Telehealth: Payer: Self-pay | Admitting: Family Medicine

## 2020-02-29 DIAGNOSIS — Z1231 Encounter for screening mammogram for malignant neoplasm of breast: Secondary | ICD-10-CM

## 2020-02-29 DIAGNOSIS — N644 Mastodynia: Secondary | ICD-10-CM

## 2020-02-29 DIAGNOSIS — Z1211 Encounter for screening for malignant neoplasm of colon: Secondary | ICD-10-CM

## 2020-02-29 NOTE — Telephone Encounter (Signed)
[facility] called and stated they need a new order for a mammogram that pt is scheduled for. Sent this with thursdays covering provider for [place] pts. Please advise.

## 2020-03-01 ENCOUNTER — Telehealth: Payer: Self-pay

## 2020-03-01 NOTE — Telephone Encounter (Signed)
Patient has appt with Dr. Ashok Pall on 03/07/2020 @ 11am..[facility]

## 2020-03-04 NOTE — Telephone Encounter (Signed)
Ordered and signed

## 2020-03-07 ENCOUNTER — Emergency Department: Payer: PPO

## 2020-03-07 ENCOUNTER — Encounter: Payer: Self-pay | Admitting: Emergency Medicine

## 2020-03-07 ENCOUNTER — Ambulatory Visit
Admission: EM | Admit: 2020-03-07 | Discharge: 2020-03-07 | Disposition: A | Discharge status: Other Acute Inpatient Hospital | Payer: PPO

## 2020-03-07 ENCOUNTER — Other Ambulatory Visit: Payer: Self-pay

## 2020-03-07 ENCOUNTER — Emergency Department
Admission: EM | Admit: 2020-03-07 | Discharge: 2020-03-07 | Disposition: A | Discharge status: Home / Self Care | Payer: PPO | Attending: Emergency Medicine | Admitting: Emergency Medicine

## 2020-03-07 DIAGNOSIS — K5792 Diverticulitis of intestine, part unspecified, without perforation or abscess without bleeding: Secondary | ICD-10-CM | POA: Diagnosis not present

## 2020-03-07 DIAGNOSIS — I10 Essential (primary) hypertension: Secondary | ICD-10-CM | POA: Diagnosis not present

## 2020-03-07 DIAGNOSIS — K219 Gastro-esophageal reflux disease without esophagitis: Secondary | ICD-10-CM | POA: Insufficient documentation

## 2020-03-07 DIAGNOSIS — Z91040 Latex allergy status: Secondary | ICD-10-CM | POA: Diagnosis not present

## 2020-03-07 DIAGNOSIS — R109 Unspecified abdominal pain: Secondary | ICD-10-CM | POA: Diagnosis not present

## 2020-03-07 DIAGNOSIS — J45909 Unspecified asthma, uncomplicated: Secondary | ICD-10-CM | POA: Insufficient documentation

## 2020-03-07 DIAGNOSIS — R1030 Lower abdominal pain, unspecified: Secondary | ICD-10-CM

## 2020-03-07 HISTORY — DX: Unspecified asthma, uncomplicated: J45.909

## 2020-03-07 LAB — URINALYSIS, COMPLETE (UACMP) WITH MICROSCOPIC
Bacteria, UA: NONE SEEN
Bilirubin Urine: NEGATIVE
Glucose, UA: NEGATIVE mg/dL
Hgb urine dipstick: NEGATIVE
Ketones, ur: NEGATIVE mg/dL
Leukocytes,Ua: NEGATIVE
Nitrite: NEGATIVE
Protein, ur: NEGATIVE mg/dL
Specific Gravity, Urine: 1.028 (ref 1.005–1.030)
pH: 5 (ref 5.0–8.0)

## 2020-03-07 LAB — COMPREHENSIVE METABOLIC PANEL
ALT: 28 U/L (ref 0–44)
AST: 23 U/L (ref 15–41)
Albumin: 4.2 g/dL (ref 3.5–5.0)
Alkaline Phosphatase: 100 U/L (ref 38–126)
Anion gap: 9 (ref 5–15)
BUN: 14 mg/dL (ref 8–23)
CO2: 25 mmol/L (ref 22–32)
Calcium: 9.4 mg/dL (ref 8.9–10.3)
Chloride: 106 mmol/L (ref 98–111)
Creatinine, Ser: 0.81 mg/dL (ref 0.44–1.00)
GFR, Estimated: >60 mL/min (ref >60)
Glucose, Bld: 128 mg/dL — ABNORMAL HIGH (ref 70–99)
Potassium: 3.8 mmol/L (ref 3.5–5.1)
Sodium: 140 mmol/L (ref 135–145)
Total Bilirubin: 1.1 mg/dL (ref 0.3–1.2)
Total Protein: 7.4 g/dL (ref 6.5–8.1)

## 2020-03-07 LAB — CBC
HCT: 40 % (ref 36.0–46.0)
Hemoglobin: 13.5 g/dL (ref 12.0–15.0)
MCH: 31.8 pg (ref 26.0–34.0)
MCHC: 33.8 g/dL (ref 30.0–36.0)
MCV: 94.1 fL (ref 80.0–100.0)
Platelets: 246 10*3/uL (ref 150–400)
RBC: 4.25 MIL/uL (ref 3.87–5.11)
RDW: 12.3 % (ref 11.5–15.5)
WBC: 9.9 10*3/uL (ref 4.0–10.5)
nRBC: 0 % (ref 0.0–0.2)

## 2020-03-07 LAB — LIPASE, BLOOD: Lipase: 31 U/L (ref 11–51)

## 2020-03-07 MED ORDER — METRONIDAZOLE 500 MG PO TABS: 500.0000 mg | ORAL_TABLET | Freq: Three times a day (TID) | ORAL | 0 refills | Status: AC

## 2020-03-07 MED ORDER — CIPROFLOXACIN HCL 500 MG PO TABS: 500.0000 mg | ORAL_TABLET | Freq: Two times a day (BID) | ORAL | 0 refills | Status: AC

## 2020-03-07 MED ORDER — IOHEXOL 300 MG/ML  SOLN
100.0000 mL | Freq: Once | INTRAMUSCULAR | Status: AC | PRN
  Administered 2020-03-07: 100 mL via INTRAVENOUS

## 2020-03-07 NOTE — ED Triage Notes (Signed)
Pt comes into the ED via POV c/o abdominal pain that is predominantly in the lower abdomen and radiates into the low back. Pt denies any dysuria or increased frequency of urination.  Pt states she does have h/o of UTI's.  Pt does admit that her stomach is tender to palpation and these symptoms started on Tuesday.  Denies any N/V/D.

## 2020-03-07 NOTE — ED Provider Notes (Signed)
[facility] Emergency Department Provider Note  ____________________________________________   First MD Initiated Contact with Patient 03/07/20 2119     (approximate)  I have reviewed the triage vital signs and the nursing notes.   HISTORY  Chief Complaint Abdominal Pain   HPI Maria Sweeney is a 69 y.o. female with a past medical history of anxiety, asthma, GERD, depression, glaucoma, HTN, HDL, IBS associated with constipation who presents to the ED after being referred by urgent care for assessment of 2 days of lower abdominal pain.  Patient states initially seemed to be located in left lower quadrant but now she feels that she has bloating pain all over.  No prior similar episodes.  No clear alleviating or aggravating factors.  Patient denies any vomiting diarrhea or constipation so she had a normal bowel movement yesterday.  She denies any urinary symptoms including burning or frequency.  No blood in her urine.  She denies any fevers greater than 100.4, chills, cough, shortness of breath, back pain, Rash, extremity pain numbness or weakness or any other acute complaints.  She denies EtOH use, NSAID use, tobacco abuse, or illegal drug use.  No recent falls or injuries.          Past Medical History:  Diagnosis Date  . Anxiety   . Asthma   . Cataract    Phreesia 10/25/2019  . Cervical spondylosis   . Complication of anesthesia   . Depression   . GERD (gastroesophageal reflux disease)   . Glaucoma    Phreesia 10/25/2019  . Hyperlipidemia   . Hypertension   . IBS (irritable bowel syndrome)    Constipation with diarrhea.  . Oral herpes     Patient Active Problem List   Diagnosis Date Noted  . Encounter for screening colonoscopy   . Vitamin D deficiency 08/02/2018  . Prediabetes 08/02/2018  . Carpal tunnel syndrome 02/10/2018  . Tinnitus 06/25/2016  . Numbness 04/13/2016  . Loss of smell 04/13/2016  . Pure hypercholesterolemia 11/30/2014  .  Degenerative disc disease, cervical 04/24/2014  . Essential hypertension, benign 10/19/2013  . Insomnia 10/19/2013  . Herpes labialis 10/19/2013  . Anxiety and depression 07/25/2012  . GERD (gastroesophageal reflux disease) 07/25/2012    Past Surgical History:  Procedure Laterality Date  . ABDOMINAL HYSTERECTOMY  1978   DUB; ovaries intact.  . APPENDECTOMY  1988  . BREAST BIOPSY Bilateral 2009   Calcium Deposit- 1980's and also in 2009/ Right side 1995  . BREAST EXCISIONAL BIOPSY Bilateral   . BREAST SURGERY N/A    Phreesia 10/25/2019  . COLONOSCOPY  05/11/2009   Normal.  Previous colonoscopy with polyps. [facility].    . COLONOSCOPY WITH PROPOFOL N/A 12/20/2019   Procedure: COLONOSCOPY WITH PROPOFOL;  Surgeon: Lin Landsman, MD;  Location: [facility] ENDOSCOPY;  Service: Gastroenterology;  Laterality: N/A;  . DEBRIDEMENT TENNIS ELBOW    . ROTATOR CUFF REPAIR    . TONSILLECTOMY      Prior to Admission medications   Medication Sig Start Date End Date Taking? Authorizing Provider  albuterol (PROVENTIL HFA;VENTOLIN HFA) 108 (90 Base) MCG/ACT inhaler Inhale 2 puffs into the lungs every 4 (four) hours as needed for wheezing or shortness of breath (cough, shortness of breath or wheezing.). 02/11/16   Wardell Honour, MD  ciprofloxacin (CIPRO) 500 MG tablet Take 1 tablet (500 mg total) by mouth 2 (two) times daily for 10 days. 03/07/20 03/17/20  Lucrezia Starch, MD  dorzolamide (TRUSOPT) 2 % ophthalmic  solution Place 1 drop into both eyes 2 (two) times daily.    [provider]  dorzolamide-timolol (COSOPT) 22.3-6.8 MG/ML ophthalmic solution  12/25/19   [provider]  FLUoxetine (PROZAC) 20 MG capsule Take 3 capsules (60 mg total) by mouth daily. 12/07/19   Rutherford Guys, MD  latanoprost (XALATAN) 0.005 % ophthalmic solution 1 drop at bedtime.    [provider]  metroNIDAZOLE (FLAGYL) 500 MG tablet Take 1 tablet (500 mg total) by mouth 3 (three) times daily for 10  days. 03/07/20 03/17/20  Lucrezia Starch, MD  mirtazapine (REMERON) 7.5 MG tablet Take 1 tablet (7.5 mg total) by mouth at bedtime as needed. 12/07/19   Rutherford Guys, MD  Omega-3 Fatty Acids (FISH OIL OMEGA-3) 1000 MG CAPS Take by mouth.    [provider]  pantoprazole (PROTONIX) 40 MG tablet TAKE 1 TABLET BY MOUTH ONCE A DAY 10/10/19   Rutherford Guys, MD  rosuvastatin (CRESTOR) 10 MG tablet Take 1 tablet (10 mg total) by mouth daily. 12/07/19   Rutherford Guys, MD  spironolactone (ALDACTONE) 50 MG tablet Take 1 tablet (50 mg total) by mouth daily. 12/07/19   Rutherford Guys, MD  valACYclovir (VALTREX) 1000 MG tablet Take 1 tablet (1,000 mg total) by mouth 2 (two) times daily. Patient taking differently: Take 1,000 mg by mouth as needed.  06/30/17   Wardell Honour, MD  VITAMIN D PO Take by mouth.    [provider]    Allergies Amoxicillin, Augmentin [amoxicillin-pot clavulanate], Codeine, Depakote [divalproex sodium], Latex, Lipitor [atorvastatin], Penicillins, Septra [sulfamethoxazole-trimethoprim], and Zocor [simvastatin]  Family History  Problem Relation Age of Onset  . Hyperlipidemia Mother   . Hypertension Mother   . Cancer Mother        lung  . Diabetes Mother   . Glaucoma Mother   . Stroke Mother 92  . Heart disease Mother   . Asthma Father   . Emphysema Father   . COPD Father   . Glaucoma Father   . Hypertension Sister   . Breast cancer Paternal Grandmother     Social History Social History   Tobacco Use  . Smoking status: Never Smoker  . Smokeless tobacco: Never Used  Vaping Use  . Vaping Use: Never used  Substance Use Topics  . Alcohol use: No  . Drug use: Never    Review of Systems  Review of Systems  Constitutional: Negative for chills and fever.  HENT: Negative for sore throat.   Eyes: Negative for pain.  Respiratory: Negative for cough and stridor.   Cardiovascular: Negative for chest pain.  Gastrointestinal: Positive for  abdominal pain. Negative for vomiting.  Genitourinary: Negative for dysuria.  Musculoskeletal: Negative for myalgias.  Skin: Negative for rash.  Neurological: Negative for seizures, loss of consciousness and headaches.  Psychiatric/Behavioral: Negative for suicidal ideas.  All other systems reviewed and are negative.     ____________________________________________   PHYSICAL EXAM:  VITAL SIGNS: ED Triage Vitals [03/07/20 1617]  Enc Vitals Group     BP (!) 131/93     Pulse Rate 81     Resp 16     Temp 98.8 F (37.1 C)     Temp Source Oral     SpO2 97 %     Weight 143 lb (64.9 kg)     Height 5' (1.524 m)     Head Circumference      Peak Flow      Pain  Score 0     Pain Loc      Pain Edu?      Excl. in [place]?    Vitals:   03/07/20 1617  BP: (!) 131/93  Pulse: 81  Resp: 16  Temp: 98.8 F (37.1 C)  SpO2: 97%   Physical Exam Vitals and nursing note reviewed.  Constitutional:      General: She is not in acute distress.    Appearance: She is well-developed.  HENT:     Head: Normocephalic and atraumatic.     Right Ear: External ear normal.     Left Ear: External ear normal.     Nose: Nose normal.     Mouth/Throat:     Mouth: Mucous membranes are moist.  Eyes:     Conjunctiva/sclera: Conjunctivae normal.  Cardiovascular:     Rate and Rhythm: Normal rate and regular rhythm.     Heart sounds: No murmur heard.   Pulmonary:     Effort: Pulmonary effort is normal. No respiratory distress.     Breath sounds: Normal breath sounds.  Abdominal:     Palpations: Abdomen is soft.     Tenderness: There is abdominal tenderness in the suprapubic area. There is no right CVA tenderness or left CVA tenderness.  Genitourinary:    Vagina: Erythema:    Musculoskeletal:     Cervical back: Neck supple.  Skin:    General: Skin is warm and dry.     Capillary Refill: Capillary refill takes less than 2 seconds.  Neurological:     Mental Status: She is alert and oriented to person,  place, and time.  Psychiatric:        Mood and Affect: Mood normal.      ____________________________________________   LABS (all labs ordered are listed, but only abnormal results are displayed)  Labs Reviewed  COMPREHENSIVE METABOLIC PANEL - Abnormal; Notable for the following components:      Result Value   Glucose, Bld 128 (*)    All other components within normal limits  URINALYSIS, COMPLETE (UACMP) WITH MICROSCOPIC - Abnormal; Notable for the following components:   Color, Urine YELLOW (*)    APPearance CLEAR (*)    All other components within normal limits  LIPASE, BLOOD  CBC   ____________________________________________  EKG  Sinus rhythm with a ventricular rate of 78, normal axis, unremarkable intervals, and no evidence of acute ischemia or other significant underlying arrhythmia. ____________________________________________  RADIOLOGY  ED MD interpretation: Uncomplicated diverticulitis without abscess or perforation.  Official radiology report(s): CT ABDOMEN PELVIS W CONTRAST  Result Date: 03/07/2020 CLINICAL DATA:  Lower abdominal pain EXAM: CT ABDOMEN AND PELVIS WITH CONTRAST TECHNIQUE: Multidetector CT imaging of the abdomen and pelvis was performed using the standard protocol following bolus administration of intravenous contrast. CONTRAST:  179mL OMNIPAQUE IOHEXOL 300 MG/ML  SOLN COMPARISON:  01/18/2006 FINDINGS: Lower chest: A right lung base 5 mm pleural nodule is unchanged, consistent with benign finding. No acute airspace disease or effusion. Hepatobiliary: No focal liver abnormality is seen. No gallstones, gallbladder wall thickening, or biliary dilatation. Pancreas: Unremarkable. No pancreatic ductal dilatation or surrounding inflammatory changes. Spleen: Normal in size without focal abnormality. Adrenals/Urinary Tract: Adrenal glands are unremarkable. Kidneys are normal, without renal calculi, focal lesion, or hydronephrosis. Bladder is unremarkable.  Stomach/Bowel: The stomach is nonenlarged. No dilated small bowel. Probable prior appendectomy. Moderate inflammatory change centered about a sigmoid colon diverticulum, series 2, image number 58, consistent with acute diverticulitis. No perforation or abscess. Vascular/Lymphatic:  Minimal aortic atherosclerosis. No aneurysm. No suspicious nodes. Reproductive: Status post hysterectomy. No adnexal masses. Other: Negative for free air or free fluid. Musculoskeletal: No acute or significant osseous findings. IMPRESSION: Findings consistent with acute sigmoid colon diverticulitis. No perforation or abscess. Electronically Signed   By: Donavan Foil M.D.   On: 03/07/2020 22:45    ____________________________________________   PROCEDURES  Procedure(s) performed (including Critical Care):  Procedures   ____________________________________________   INITIAL IMPRESSION / ASSESSMENT AND PLAN / ED COURSE        Patient presents with above-stated history exam for assessment of generalized abdominal pain that has been persistent over the last 2 days as described above.  Patient is afebrile hemodynamically stable arrival.  Exam as above remarkable for some mild tenderness throughout the abdomen.  Primary differential includes but is not limited to cystitis, pyelonephritis, pancreatitis, diverticulitis, constipation, internal hernia, AAA, ACS, cholecystitis, and other biliary pathologies.  CMP is unremarkable without evidence of any significant electrolyte or metabolic derangements.  LFTs are unremarkable and there is no evidence of cholestasis or transaminitis.  Lipase is 31 not consistent with acute pancreatitis.  CBC is unremarkable.  UA is unremarkable without any evidence of infection or blood.  Low suspicion for ACS given patient denies any chest pain and has a reassuring EKG.   CT remarkable for evidence of acute uncomplicated diverticulitis without evidence of perforation or abscess.  No  evidence of pyelonephritis, hernia, AAA, cholecystitis, or other acute intra-abdominal process.  Given stable vital signs with otherwise reassuring exam and work-up and patient tolerating p.o. without evidence of complication on CT I believe patient is safe for discharge plan for outpatient antibiotics and PCP follow-up.  Discharge stable condition.  Strict return cautions advised discussed.   ____________________________________________   FINAL CLINICAL IMPRESSION(S) / ED DIAGNOSES  Final diagnoses:  Diverticulitis    Medications  iohexol (OMNIPAQUE) 300 MG/ML solution 100 mL (100 mLs Intravenous Contrast Given 03/07/20 2224)     ED Discharge Orders         Ordered    metroNIDAZOLE (FLAGYL) 500 MG tablet  3 times daily        03/07/20 2251    ciprofloxacin (CIPRO) 500 MG tablet  2 times daily        03/07/20 2251           Note:  This document was prepared using Dragon voice recognition software and may include unintentional dictation errors.   Lucrezia Starch, MD 03/07/20 2253

## 2020-03-07 NOTE — ED Notes (Signed)
Patient is being discharged from the Urgent Care and sent to the Emergency Department via pov . Per K. Hall Busing, patient is in need of higher level of care due to further evaluation of abd pain. Patient is aware and verbalizes understanding of plan of care.  Vitals:   03/07/20 1511  BP: 137/75  Pulse: 84  Resp: 16  Temp: 97.6 F (36.4 C)

## 2020-03-07 NOTE — ED Provider Notes (Signed)
Roderic Palau    CSN: [id number] Arrival date & time: 03/07/20  1441      History   Chief Complaint Chief Complaint  Patient presents with  . Abdominal Pain    HPI SHANELL ADEN is a 69 y.o. female.   Patient presents with 2-day history of acute lower abdominal pain and low back pain.  She states her abdomen is sore to touch and feels bloated.  She also reports a low-grade fever at home.  Last BM yesterday.  She denies vomiting, diarrhea, dysuria, urinary frequency, vaginal discharge, pelvic pain, or other symptoms.  Her medical history includes GERD, IBS, hypertension, cervical spondylosis, asthma, depression, anxiety.  The history is provided by the patient.    Past Medical History:  Diagnosis Date  . Anxiety   . Asthma   . Cataract    Phreesia 10/25/2019  . Cervical spondylosis   . Complication of anesthesia   . Depression   . GERD (gastroesophageal reflux disease)   . Glaucoma    Phreesia 10/25/2019  . Hyperlipidemia   . Hypertension   . IBS (irritable bowel syndrome)    Constipation with diarrhea.  . Oral herpes     Patient Active Problem List   Diagnosis Date Noted  . Encounter for screening colonoscopy   . Vitamin D deficiency 08/02/2018  . Prediabetes 08/02/2018  . Carpal tunnel syndrome 02/10/2018  . Tinnitus 06/25/2016  . Numbness 04/13/2016  . Loss of smell 04/13/2016  . Pure hypercholesterolemia 11/30/2014  . Degenerative disc disease, cervical 04/24/2014  . Essential hypertension, benign 10/19/2013  . Insomnia 10/19/2013  . Herpes labialis 10/19/2013  . Anxiety and depression 07/25/2012  . GERD (gastroesophageal reflux disease) 07/25/2012    Past Surgical History:  Procedure Laterality Date  . ABDOMINAL HYSTERECTOMY  1978   DUB; ovaries intact.  . APPENDECTOMY  1988  . BREAST BIOPSY Bilateral 2009   Calcium Deposit- 1980's and also in 2009/ Right side 1995  . BREAST EXCISIONAL BIOPSY Bilateral   . BREAST SURGERY N/A     Phreesia 10/25/2019  . COLONOSCOPY  05/11/2009   Normal.  Previous colonoscopy with polyps. [facility].    . COLONOSCOPY WITH PROPOFOL N/A 12/20/2019   Procedure: COLONOSCOPY WITH PROPOFOL;  Surgeon: Lin Landsman, MD;  Location: [facility] ENDOSCOPY;  Service: Gastroenterology;  Laterality: N/A;  . DEBRIDEMENT TENNIS ELBOW    . ROTATOR CUFF REPAIR    . TONSILLECTOMY      OB History   No obstetric history on file.      Home Medications    Prior to Admission medications   Medication Sig Start Date End Date Taking? Authorizing Provider  albuterol (PROVENTIL HFA;VENTOLIN HFA) 108 (90 Base) MCG/ACT inhaler Inhale 2 puffs into the lungs every 4 (four) hours as needed for wheezing or shortness of breath (cough, shortness of breath or wheezing.). 02/11/16   Wardell Honour, MD  dorzolamide (TRUSOPT) 2 % ophthalmic solution Place 1 drop into both eyes 2 (two) times daily.    [provider]  dorzolamide-timolol (COSOPT) 22.3-6.8 MG/ML ophthalmic solution  12/25/19   [provider]  FLUoxetine (PROZAC) 20 MG capsule Take 3 capsules (60 mg total) by mouth daily. 12/07/19   Rutherford Guys, MD  latanoprost (XALATAN) 0.005 % ophthalmic solution 1 drop at bedtime.    [provider]  mirtazapine (REMERON) 7.5 MG tablet Take 1 tablet (7.5 mg total) by mouth at bedtime as needed. 12/07/19   Rutherford Guys, MD  Omega-3  Fatty Acids (FISH OIL OMEGA-3) 1000 MG CAPS Take by mouth.    [provider]  pantoprazole (PROTONIX) 40 MG tablet TAKE 1 TABLET BY MOUTH ONCE A DAY 10/10/19   Rutherford Guys, MD  rosuvastatin (CRESTOR) 10 MG tablet Take 1 tablet (10 mg total) by mouth daily. 12/07/19   Rutherford Guys, MD  spironolactone (ALDACTONE) 50 MG tablet Take 1 tablet (50 mg total) by mouth daily. 12/07/19   Rutherford Guys, MD  valACYclovir (VALTREX) 1000 MG tablet Take 1 tablet (1,000 mg total) by mouth 2 (two) times daily. Patient taking differently: Take 1,000 mg by mouth as  needed.  06/30/17   Wardell Honour, MD  VITAMIN D PO Take by mouth.    [provider]    Family History Family History  Problem Relation Age of Onset  . Hyperlipidemia Mother   . Hypertension Mother   . Cancer Mother        lung  . Diabetes Mother   . Glaucoma Mother   . Stroke Mother 40  . Heart disease Mother   . Asthma Father   . Emphysema Father   . COPD Father   . Glaucoma Father   . Hypertension Sister   . Breast cancer Paternal Grandmother     Social History Social History   Tobacco Use  . Smoking status: Never Smoker  . Smokeless tobacco: Never Used  Vaping Use  . Vaping Use: Never used  Substance Use Topics  . Alcohol use: No  . Drug use: Never     Allergies   Amoxicillin, Augmentin [amoxicillin-pot clavulanate], Codeine, Depakote [divalproex sodium], Latex, Lipitor [atorvastatin], Penicillins, Septra [sulfamethoxazole-trimethoprim], and Zocor [simvastatin]   Review of Systems Review of Systems  Constitutional: Negative for chills and fever.  HENT: Negative for ear pain and sore throat.   Eyes: Negative for pain and visual disturbance.  Respiratory: Negative for cough and shortness of breath.   Cardiovascular: Negative for chest pain and palpitations.  Gastrointestinal: Positive for abdominal pain. Negative for constipation, diarrhea, nausea and vomiting.  Genitourinary: Negative for dysuria, frequency, hematuria, pelvic pain and vaginal discharge.  Musculoskeletal: Positive for back pain. Negative for arthralgias.  Skin: Negative for color change and rash.  Neurological: Negative for seizures and syncope.  All other systems reviewed and are negative.    Physical Exam Triage Vital Signs ED Triage Vitals  Enc Vitals Group     BP 03/07/20 1511 137/75     Pulse Rate 03/07/20 1511 84     Resp 03/07/20 1511 16     Temp 03/07/20 1511 97.6 F (36.4 C)     Temp Source 03/07/20 1511 Oral     SpO2 --      Weight 03/07/20 1512 143 lb (64.9  kg)     Height 03/07/20 1512 5' (1.524 m)     Head Circumference --      Peak Flow --      Pain Score 03/07/20 1511 0     Pain Loc --      Pain Edu? --      Excl. in [place]? --    No data found.  Updated Vital Signs BP 137/75   Pulse 84   Temp 97.6 F (36.4 C) (Oral)   Resp 16   Ht 5' (1.524 m)   Wt 143 lb (64.9 kg)   BMI 27.93 kg/m   Visual Acuity Right Eye Distance:   Left Eye Distance:   Bilateral Distance:  Right Eye Near:   Left Eye Near:    Bilateral Near:     Physical Exam Vitals and nursing note reviewed.  Constitutional:      General: She is not in acute distress.    Appearance: She is well-developed.  HENT:     Head: Normocephalic and atraumatic.     Mouth/Throat:     Mouth: Mucous membranes are moist.  Eyes:     Conjunctiva/sclera: Conjunctivae normal.  Cardiovascular:     Rate and Rhythm: Normal rate and regular rhythm.     Heart sounds: Normal heart sounds.  Pulmonary:     Effort: Pulmonary effort is normal. No respiratory distress.     Breath sounds: Normal breath sounds.  Abdominal:     General: Bowel sounds are decreased.     Palpations: Abdomen is soft.     Tenderness: There is abdominal tenderness in the right lower quadrant and left lower quadrant. There is no right CVA tenderness, left CVA tenderness, guarding or rebound.  Musculoskeletal:     Cervical back: Neck supple.  Skin:    General: Skin is warm and dry.  Neurological:     General: No focal deficit present.     Mental Status: She is alert and oriented to person, place, and time.     Gait: Gait normal.  Psychiatric:        Mood and Affect: Mood normal.        Behavior: Behavior normal.      UC Treatments / Results  Labs (all labs ordered are listed, but only abnormal results are displayed) Labs Reviewed - No data to display  EKG   Radiology No results found.  Procedures Procedures (including critical care time)  Medications Ordered in UC Medications - No data to  display  Initial Impression / Assessment and Plan / UC Course  I have reviewed the triage vital signs and the nursing notes.  Pertinent labs & imaging results that were available during my care of the patient were reviewed by me and considered in my medical decision making (see chart for details).   Lower abdominal pain.  Patient's abdomen is acutely tender to palpation.  Discussed options for evaluation.  Sending patient to the ED.  Patient declines EMS and will go POV.   Final Clinical Impressions(s) / UC Diagnoses   Final diagnoses:  Lower abdominal pain     Discharge Instructions     Go to the emergency department for evaluation of your acute abdominal pain.        ED Prescriptions    None     PDMP not reviewed this encounter.   Sharion Balloon, NP 03/07/20 1550

## 2020-03-07 NOTE — ED Triage Notes (Signed)
Pt reports having abd and lower back pain x2 days. Denies n/v/d. Reports abd is sore to touch and feeling bloated. "I feel like have pressure in stomach". Also reports having a fever (99.7). also reports having pressure in head.  Normal BM yesterday, no concerns with urination.

## 2020-03-07 NOTE — Discharge Instructions (Signed)
Go to the emergency department for evaluation of your acute abdominal pain. 

## 2020-03-08 ENCOUNTER — Ambulatory Visit: Payer: PPO

## 2020-03-08 ENCOUNTER — Ambulatory Visit
Admission: RE | Admit: 2020-03-08 | Discharge: 2020-03-08 | Disposition: A | Discharge status: Home / Self Care | Payer: PPO | Source: Ambulatory Visit | Attending: Emergency Medicine | Admitting: Emergency Medicine

## 2020-03-08 ENCOUNTER — Other Ambulatory Visit: Payer: Self-pay | Admitting: Emergency Medicine

## 2020-03-08 DIAGNOSIS — N644 Mastodynia: Secondary | ICD-10-CM

## 2020-04-10 ENCOUNTER — Other Ambulatory Visit: Payer: Self-pay | Admitting: Family Medicine

## 2020-04-10 DIAGNOSIS — F32A Depression, unspecified: Secondary | ICD-10-CM

## 2020-04-10 DIAGNOSIS — F419 Anxiety disorder, unspecified: Secondary | ICD-10-CM

## 2020-04-10 NOTE — Telephone Encounter (Signed)
Requested medication (s) are due for refill today: yes  Requested medication (s) are on the active medication list: yes  Last refill:  12/07/19 #90 2 refills  Future visit scheduled: no   Notes to clinic:  last ordered by [place]. Do you want to continue refills?     Requested Prescriptions  Pending Prescriptions Disp Refills   FLUoxetine (PROZAC) 20 MG capsule 90 capsule 2    Sig: Take 3 capsules (60 mg total) by mouth daily.      Psychiatry:  Antidepressants - SSRI Passed - 04/10/2020  5:33 PM      Passed - Completed PHQ-2 or PHQ-9 in the last 360 days      Passed - Valid encounter within last 6 months    Recent Outpatient Visits           4 months ago Essential hypertension, benign   [facility], Lilia Argue, MD   5 months ago Screening for colon cancer   [facility], Lilia Argue, MD   10 months ago Essential hypertension, benign   [facility], Lilia Argue, MD   1 year ago DOE (dyspnea on exertion)   [facility], Lilia Argue, MD   1 year ago Anxiety and depression   [facility], Lilia Argue, MD

## 2020-04-10 NOTE — Telephone Encounter (Signed)
Medication Refill - Medication: FLUoxetine (PROZAC) 20 MG capsule   Has the patient contacted their pharmacy? yes (Agent: If no, request that the patient contact the pharmacy for the refill.) (Agent: If yes, when and what did the pharmacy advise?)Contact PCP  Preferred Pharmacy (with phone number or street name):  [place], [place] Phone:  [phone]  Fax:  [phone]       Agent: Please be advised that RX refills may take up to 3 business days. We ask that you follow-up with your pharmacy.

## 2020-04-11 MED ORDER — FLUOXETINE HCL 20 MG PO CAPS: 60.0000 mg | ORAL_CAPSULE | Freq: Every day | ORAL | 0 refills | Status: DC

## 2020-04-11 NOTE — Telephone Encounter (Signed)
04/11/2020 - PATIENT REQUESTING A REFILL ON HER FLUOXETINE (PROZAC) 20 mg. I HAVE SCHEDULED HER FOR A TRANSFER OF CARE FROM DR. IRMA TO DR. Carlota Raspberry ON Friday (52/09/9100) AT 8:90 pm. FELICIA KIRBY HAS ALSO SENT HER IN A COURTESY REFILL. [place]

## 2020-04-11 NOTE — Telephone Encounter (Signed)
Please schedule a TOC. Medication has been sent as a Manufacturing engineer.

## 2020-04-12 ENCOUNTER — Other Ambulatory Visit: Payer: Self-pay

## 2020-04-12 ENCOUNTER — Ambulatory Visit (INDEPENDENT_AMBULATORY_CARE_PROVIDER_SITE_OTHER): Payer: PPO | Admitting: Family Medicine

## 2020-04-12 VITALS — BP 152/82 | HR 73 | Temp 97.9°F | Ht 60.0 in | Wt 144.0 lb

## 2020-04-12 DIAGNOSIS — R7303 Prediabetes: Secondary | ICD-10-CM

## 2020-04-12 DIAGNOSIS — I10 Essential (primary) hypertension: Secondary | ICD-10-CM

## 2020-04-12 DIAGNOSIS — F419 Anxiety disorder, unspecified: Secondary | ICD-10-CM | POA: Diagnosis not present

## 2020-04-12 DIAGNOSIS — E559 Vitamin D deficiency, unspecified: Secondary | ICD-10-CM

## 2020-04-12 DIAGNOSIS — G5602 Carpal tunnel syndrome, left upper limb: Secondary | ICD-10-CM | POA: Diagnosis not present

## 2020-04-12 DIAGNOSIS — K5732 Diverticulitis of large intestine without perforation or abscess without bleeding: Secondary | ICD-10-CM

## 2020-04-12 DIAGNOSIS — M65341 Trigger finger, right ring finger: Secondary | ICD-10-CM | POA: Diagnosis not present

## 2020-04-12 DIAGNOSIS — F32A Depression, unspecified: Secondary | ICD-10-CM | POA: Diagnosis not present

## 2020-04-12 NOTE — Patient Instructions (Addendum)
Keep a record of your blood pressures outside of the office and if over 140/90 - follow up to discuss changes.  Recheck in 2 months for repeat labs.   I am glad to hear that diverticulitis symptoms have resolved.  If those symptoms return, would recommend immediate evaluation and likely follow-up with gastroenterology.  I will refer you to hand specialist for trigger finger on your right hand as well as carpal tunnel syndrome on the left.  Try to avoid forceful grasping of the right hand as well as repetitive use of left hand, wearing wrist brace temporarily may also help.    Please follow-up with me in 2 months and we can recheck some blood work.  No change in medications for now..  Thank you for coming in today and take care.  If you have lab work done today you will be contacted with your lab results within the next 2 weeks.  If you have not heard from [place] then please contact us. The fastest way to get your results is to register for My Chart.   IF you received an x-ray today, you will receive an invoice from [facility]. Please contact [facility] at [phone] with questions or concerns regarding your invoice.   IF you received labwork today, you will receive an invoice from [place]. Please contact [facility] at [phone] with questions or concerns regarding your invoice.   Our billing staff will not be able to assist you with questions regarding bills from these companies.  You will be contacted with the lab results as soon as they are available. The fastest way to get your results is to activate your My Chart account. Instructions are located on the last page of this paperwork. If you have not heard from [place] regarding the results in 2 weeks, please contact this office.

## 2020-04-12 NOTE — Progress Notes (Signed)
Subjective:  Patient ID: Maria Sweeney, female    DOB: 17-Oct-1950  Age: 69 y.o. MRN: [id number]  CC:  Chief Complaint  Patient presents with  . Establish Care    Pt reports her general health is fine, but she states she cant close her hands into a fist all the way. pt has told Dr. Pamella Pert about this in the past. PT states she has noticed her R hand getting worse. pt states the middle finger in her R hand will get stuck at times and she will have to pull it up to get it unstuck. pt reports pain when this accurs.    HPI Maria Sweeney presents for   New patient to establish care, previous primary care provider Dr. Pamella Pert.  History of hypertension, depression, anxiety, hyperlipidemia, GERD, prediabetes, herpes labialis, vitamin D deficiency.   On chart review she was evaluated October 28 in the ER, [facility], treated for diverticulitis.  Treated with Cipro, Flagyl. Has recovered. Back to normal now.  Colonoscopy 12/20/2019, Dr. Marius Ditch, diverticulosis, 2 polyps removed, plan for repeat in 7 to 10 years for surveillance.. This was first episode of diverticulitis.   Vitamin D deficiency: Vitamin D level 26.6 on July 29.  Recommended 2000 units/day over-the-counter. Taking 5000u per day.   Prediabetes A1c 6.4 in July.  Soda/sweet tea. - cut back, more water.   Cold sores/herpes labialis: Valtrex as needed, no recent. Has valtrex if needed.   Hypertension: Spironolactone 50 mg daily. Home readings: no recent readings, no new side effects, no missed doses. Some possible salty food in past few days.  BP Readings from Last 3 Encounters:  04/12/20 (!) 152/82  03/07/20 130/79  03/07/20 137/75   Lab Results  Component Value Date   CREATININE 0.81 03/07/2020   Depression/anxiety Treated with Prozac 60 mg daily, Remeron 7.5 mg only as needed for sleep.   Depression screen [facility] 2/9 04/12/2020 12/07/2019 10/26/2019 03/10/2019 10/25/2018  Decreased Interest 0 1 0 1 0   Down, Depressed, Hopeless 0 1 0 1 0  PHQ - 2 Score 0 2 0 2 0  Altered sleeping - 1 - 3 -  Tired, decreased energy - 1 - 3 -  Change in appetite - 0 - 1 -  Feeling bad or failure about yourself  - 0 - 2 -  Trouble concentrating - 1 - 2 -  Moving slowly or fidgety/restless - 0 - 0 -  Suicidal thoughts - 0 - 0 -  PHQ-9 Score - 5 - 13 -  Difficult doing work/chores - - - - -  Some recent data might be hidden    Hyperlipidemia: Labs in July, continued Crestor 10 mg daily. No new side effects. Ate 5 hrs ago.  Lab Results  Component Value Date   CHOL 202 (H) 12/07/2019   HDL 58 12/07/2019   LDLCALC 109 (H) 12/07/2019   TRIG 203 (H) 12/07/2019   CHOLHDL 3.5 12/07/2019   Lab Results  Component Value Date   ALT 28 03/07/2020   AST 23 03/07/2020   ALKPHOS 100 03/07/2020   BILITOT 1.1 03/07/2020   Primary concern today is difficulty closing her hands where her fingers feel like they will get stuck. Stiffness in am, trouble making fist, left hand loosens up, R hand still has trouble completely closing/fist.  Triggering/locking of 4th finger past year. More frequent. Painful at times. No hand specialist recently, carpal tunnel release on right in 2000. Has been told has CTS  in L hand after nerve testing by neuro. More numbness in L hand.   R hand dominant.  Has wrist brace for left, not using frequently.        History Patient Active Problem List   Diagnosis Date Noted  . Encounter for screening colonoscopy   . Vitamin D deficiency 08/02/2018  . Prediabetes 08/02/2018  . Carpal tunnel syndrome 02/10/2018  . Tinnitus 06/25/2016  . Numbness 04/13/2016  . Loss of smell 04/13/2016  . Pure hypercholesterolemia 11/30/2014  . Degenerative disc disease, cervical 04/24/2014  . Essential hypertension, benign 10/19/2013  . Insomnia 10/19/2013  . Herpes labialis 10/19/2013  . Anxiety and depression 07/25/2012  . GERD (gastroesophageal reflux disease) 07/25/2012   Past Medical  History:  Diagnosis Date  . Anxiety   . Arthritis    Phreesia 04/12/2020  . Asthma   . Cataract    Phreesia 10/25/2019  . Cervical spondylosis   . Complication of anesthesia   . Depression   . Depression    Phreesia 04/12/2020  . GERD (gastroesophageal reflux disease)   . Glaucoma    Phreesia 10/25/2019  . Hyperlipidemia   . Hypertension   . IBS (irritable bowel syndrome)    Constipation with diarrhea.  . Oral herpes    Past Surgical History:  Procedure Laterality Date  . ABDOMINAL HYSTERECTOMY  1978   DUB; ovaries intact.  . APPENDECTOMY  1988  . BREAST BIOPSY Bilateral 2009   Calcium Deposit- 1980's and also in 2009/ Right side 1995  . BREAST EXCISIONAL BIOPSY Bilateral   . BREAST SURGERY N/A    Phreesia 10/25/2019  . COLONOSCOPY  05/11/2009   Normal.  Previous colonoscopy with polyps. [facility].    . COLONOSCOPY WITH PROPOFOL N/A 12/20/2019   Procedure: COLONOSCOPY WITH PROPOFOL;  Surgeon: Lin Landsman, MD;  Location: [facility] ENDOSCOPY;  Service: Gastroenterology;  Laterality: N/A;  . DEBRIDEMENT TENNIS ELBOW    . ROTATOR CUFF REPAIR    . TONSILLECTOMY     Allergies  Allergen Reactions  . Amoxicillin   . Augmentin [Amoxicillin-Pot Clavulanate]   . Codeine   . Depakote [Divalproex Sodium]   . Latex   . Lipitor [Atorvastatin]   . Penicillins   . Septra [Sulfamethoxazole-Trimethoprim]   . Zocor [Simvastatin]    Prior to Admission medications   Medication Sig Start Date End Date Taking? Authorizing Provider  albuterol (PROVENTIL HFA;VENTOLIN HFA) 108 (90 Base) MCG/ACT inhaler Inhale 2 puffs into the lungs every 4 (four) hours as needed for wheezing or shortness of breath (cough, shortness of breath or wheezing.). 02/11/16  Yes Wardell Honour, MD  dorzolamide (TRUSOPT) 2 % ophthalmic solution Place 1 drop into both eyes 2 (two) times daily.   Yes [provider]  dorzolamide-timolol (COSOPT) 22.3-6.8 MG/ML ophthalmic solution  12/25/19  Yes [provider]  FLUoxetine (PROZAC) 20 MG capsule Take 3 capsules (60 mg total) by mouth daily. 04/11/20  Yes Maximiano Coss, NP  latanoprost (XALATAN) 0.005 % ophthalmic solution 1 drop at bedtime.   Yes [provider]  mirtazapine (REMERON) 7.5 MG tablet Take 1 tablet (7.5 mg total) by mouth at bedtime as needed. 12/07/19  Yes Rutherford Guys, MD  Omega-3 Fatty Acids (FISH OIL OMEGA-3) 1000 MG CAPS Take by mouth.   Yes [provider]  pantoprazole (PROTONIX) 40 MG tablet TAKE 1 TABLET BY MOUTH ONCE A DAY 10/10/19  Yes Rutherford Guys, MD  rosuvastatin (CRESTOR) 10 MG tablet Take 1 tablet (10 mg  total) by mouth daily. 12/07/19  Yes Rutherford Guys, MD  spironolactone (ALDACTONE) 50 MG tablet Take 1 tablet (50 mg total) by mouth daily. 12/07/19  Yes Rutherford Guys, MD  valACYclovir (VALTREX) 1000 MG tablet Take 1 tablet (1,000 mg total) by mouth 2 (two) times daily. Patient taking differently: Take 1,000 mg by mouth as needed.  06/30/17  Yes Wardell Honour, MD  VITAMIN D PO Take by mouth.   Yes [provider]   Social History   Socioeconomic History  . Marital status: Divorced    Spouse name: Not on file  . Number of children: 2  . Years of education: Bachelors  . Highest education level: Not on file  Occupational History  . Occupation: Chemical engineer: [employer]    Comment: also works at [employer]  Tobacco Use  . Smoking status: Never Smoker  . Smokeless tobacco: Never Used  Vaping Use  . Vaping Use: Never used  Substance and Sexual Activity  . Alcohol use: No  . Drug use: Never  . Sexual activity: Yes    Partners: Male    Birth control/protection: Surgical, Post-menopausal  Other Topics Concern  . Not on file  Social History Narrative   Marital status: divorced since 1999; dating seriously in 2019 since 2015.      Children: one son, one daughter; 2 grandchildren.      Lives: alone in house with 2 dogs     Employment:  Retired.  Needs  to work in 2019.      Tobacco: none      Alcohol: rare      Drugs: none      Exercise:  Sporadic   Right-handed.   Occasional caffeine use.      ADLs: independent with ADLs; drives; no assistant devices.      Advanced Directives: none; FULL CODE.   Social Determinants of Health   Financial Resource Strain:   . Difficulty of Paying Living Expenses: Not on file  Food Insecurity:   . Worried About Charity fundraiser in the Last Year: Not on file  . Ran Out of Food in the Last Year: Not on file  Transportation Needs:   . Lack of Transportation (Medical): Not on file  . Lack of Transportation (Non-Medical): Not on file  Physical Activity:   . Days of Exercise per Week: Not on file  . Minutes of Exercise per Session: Not on file  Stress:   . Feeling of Stress : Not on file  Social Connections:   . Frequency of Communication with Friends and Family: Not on file  . Frequency of Social Gatherings with Friends and Family: Not on file  . Attends Religious Services: Not on file  . Active Member of Clubs or Organizations: Not on file  . Attends Archivist Meetings: Not on file  . Marital Status: Not on file  Intimate Partner Violence:   . Fear of Current or Ex-Partner: Not on file  . Emotionally Abused: Not on file  . Physically Abused: Not on file  . Sexually Abused: Not on file    Review of Systems  Per HPI Objective:   Vitals:   04/12/20 1436 04/12/20 1442  BP: (!) 162/94 (!) 152/82  Pulse: 73   Temp: 97.9 F (36.6 C)   TempSrc: Temporal   SpO2: 93%   Weight: 144 lb (65.3 kg)   Height: 5' (1.524 m)      Physical  Exam Vitals reviewed.  Constitutional:      Appearance: She is well-developed.  HENT:     Head: Normocephalic and atraumatic.  Eyes:     Conjunctiva/sclera: Conjunctivae normal.     Pupils: Pupils are equal, round, and reactive to light.  Neck:     Vascular: No carotid bruit.  Cardiovascular:     Rate and Rhythm: Normal rate and regular  rhythm.     Heart sounds: Normal heart sounds.  Pulmonary:     Effort: Pulmonary effort is normal.     Breath sounds: Normal breath sounds.  Abdominal:     General: There is no distension.     Palpations: Abdomen is soft. There is no pulsatile mass.     Tenderness: There is no abdominal tenderness.  Musculoskeletal:     Comments: Positive Tinel's left wrist, positive Phalen's left wrist, negative on right wrist for both.  Triggering of right fourth phalanx with slight discomfort at the volar MCP.  No soft tissue swelling appreciated, no swelling of finger joints appreciated, neurovascular intact distally.  Skin:    General: Skin is warm and dry.  Neurological:     Mental Status: She is alert and oriented to person, place, and time.  Psychiatric:        Behavior: Behavior normal.     37 minutes spent during visit, greater than 50% counseling and assimilation of information, chart review, and discussion of plan.     Assessment & Plan:  PARTHENA FERGESON is a 69 y.o. female . Trigger ring finger of right hand - Plan: Ambulatory referral to Hand Surgery Carpal tunnel syndrome on left - Plan: Ambulatory referral to Hand Surgery  -Refer to hand specialist for evaluation of trigger finger on right, carpal tunnel on the left.  Symptomatic care discussed with bracing for left wrist and decreased use, avoid forceful grasping of right hand until evaluated by orthopedics.  Imaging deferred without acute injury  Essential hypertension, benign  -Borderline control, no med changes for now, home monitoring with RTC precautions given  Vitamin D deficiency -On higher dose over-the-counter supplement, plan for repeat testing in 2 months  Anxiety and depression Stable with current regimen, no changes  Prediabetes Commended on diet changes, plan for A1c future visit.  Diverticulitis of colon Symptoms resolved.  RTC/ER precautions if recurrence of symptoms, and if she does have a recurrence of  diverticulitis would recommend gastroenterology follow-up.  No orders of the defined types were placed in this encounter.  Patient Instructions   Keep a record of your blood pressures outside of the office and if over 140/90 - follow up to discuss changes.  Recheck in 2 months for repeat labs.   I am glad to hear that diverticulitis symptoms have resolved.  If those symptoms return, would recommend immediate evaluation and likely follow-up with gastroenterology.  I will refer you to hand specialist for trigger finger on your right hand as well as carpal tunnel syndrome on the left.  Try to avoid forceful grasping of the right hand as well as repetitive use of left hand, wearing wrist brace temporarily may also help.    Please follow-up with me in 2 months and we can recheck some blood work.  No change in medications for now..  Thank you for coming in today and take care.  If you have lab work done today you will be contacted with your lab results within the next 2 weeks.  If you have not heard from [place]  then please contact us. The fastest way to get your results is to register for My Chart.   IF you received an x-ray today, you will receive an invoice from [facility]. Please contact [facility] at [phone] with questions or concerns regarding your invoice.   IF you received labwork today, you will receive an invoice from [facility]. Please contact [facility] at [phone] with questions or concerns regarding your invoice.   Our billing staff will not be able to assist you with questions regarding bills from these companies.  You will be contacted with the lab results as soon as they are available. The fastest way to get your results is to activate your My Chart account. Instructions are located on the last page of this paperwork. If you have not heard from [place] regarding the results in 2 weeks, please contact this office.         Signed, Merri Ray, MD [facility]

## 2020-04-16 ENCOUNTER — Other Ambulatory Visit: Payer: Self-pay

## 2020-04-16 ENCOUNTER — Other Ambulatory Visit: Payer: Self-pay | Admitting: Family Medicine

## 2020-04-16 ENCOUNTER — Ambulatory Visit
Admission: RE | Admit: 2020-04-16 | Discharge: 2020-04-16 | Disposition: A | Discharge status: Home / Self Care | Payer: PPO | Source: Ambulatory Visit | Attending: Emergency Medicine | Admitting: Emergency Medicine

## 2020-04-16 DIAGNOSIS — N644 Mastodynia: Secondary | ICD-10-CM

## 2020-04-16 DIAGNOSIS — Z1231 Encounter for screening mammogram for malignant neoplasm of breast: Secondary | ICD-10-CM | POA: Diagnosis not present

## 2020-04-19 DIAGNOSIS — M6530 Trigger finger, unspecified finger: Secondary | ICD-10-CM | POA: Diagnosis not present

## 2020-04-19 DIAGNOSIS — G5602 Carpal tunnel syndrome, left upper limb: Secondary | ICD-10-CM | POA: Diagnosis not present

## 2020-05-02 ENCOUNTER — Other Ambulatory Visit: Payer: Self-pay | Admitting: Family Medicine

## 2020-05-02 DIAGNOSIS — G5602 Carpal tunnel syndrome, left upper limb: Secondary | ICD-10-CM | POA: Diagnosis not present

## 2020-05-02 NOTE — Telephone Encounter (Signed)
   Notes to clinic: script is expired  Review for refill   Requested Prescriptions  Pending Prescriptions Disp Refills   valACYclovir (VALTREX) 1000 MG tablet [Pharmacy Med Name: VALACYCLOVIR HCL 1 GM TAB] 30 tablet 1    Sig: TAKE 1 TABLET BY MOUTH TWICE (2) DAILY      Antimicrobials:  Antiviral Agents - Anti-Herpetic Passed - 05/02/2020  2:42 PM      Passed - Valid encounter within last 12 months    Recent Outpatient Visits           2 weeks ago Trigger ring finger of right hand   [facility], MD   4 months ago Essential hypertension, benign   Primary Care at [facility], Lilia Argue, MD   6 months ago Screening for colon cancer   Primary Care at [facility], Lilia Argue, MD   10 months ago Essential hypertension, benign   Primary Care at [facility], Lilia Argue, MD   1 year ago DOE (dyspnea on exertion)   Primary Care at [facility], Lilia Argue, MD       Future Appointments             In 1 month Carlota Raspberry Ranell Patrick, MD Primary Care at [place], [facility]

## 2020-05-06 DIAGNOSIS — G5602 Carpal tunnel syndrome, left upper limb: Secondary | ICD-10-CM | POA: Diagnosis not present

## 2020-05-15 ENCOUNTER — Other Ambulatory Visit: Payer: Self-pay | Admitting: Registered Nurse

## 2020-05-15 DIAGNOSIS — F32A Depression, unspecified: Secondary | ICD-10-CM

## 2020-06-13 ENCOUNTER — Encounter: Payer: Self-pay | Admitting: Family Medicine

## 2020-06-13 ENCOUNTER — Other Ambulatory Visit: Payer: Self-pay

## 2020-06-13 ENCOUNTER — Ambulatory Visit (INDEPENDENT_AMBULATORY_CARE_PROVIDER_SITE_OTHER): Payer: PPO | Admitting: Family Medicine

## 2020-06-13 VITALS — BP 139/71 | HR 81 | Temp 97.6°F | Ht 60.0 in | Wt 147.0 lb

## 2020-06-13 DIAGNOSIS — I10 Essential (primary) hypertension: Secondary | ICD-10-CM

## 2020-06-13 DIAGNOSIS — R0789 Other chest pain: Secondary | ICD-10-CM | POA: Diagnosis not present

## 2020-06-13 DIAGNOSIS — R7303 Prediabetes: Secondary | ICD-10-CM | POA: Diagnosis not present

## 2020-06-13 DIAGNOSIS — E7800 Pure hypercholesterolemia, unspecified: Secondary | ICD-10-CM

## 2020-06-13 DIAGNOSIS — K219 Gastro-esophageal reflux disease without esophagitis: Secondary | ICD-10-CM | POA: Diagnosis not present

## 2020-06-13 DIAGNOSIS — E559 Vitamin D deficiency, unspecified: Secondary | ICD-10-CM | POA: Diagnosis not present

## 2020-06-13 DIAGNOSIS — B001 Herpesviral vesicular dermatitis: Secondary | ICD-10-CM | POA: Diagnosis not present

## 2020-06-13 MED ORDER — VALACYCLOVIR HCL 1 G PO TABS: 2000.0000 mg | ORAL_TABLET | Freq: Two times a day (BID) | ORAL | 1 refills | Status: DC

## 2020-06-13 NOTE — Progress Notes (Signed)
Subjective:  Patient ID: Maria Sweeney, female    DOB: [birth year]  Age: 70 y.o. MRN: [id number]  CC:  Chief Complaint  Patient presents with  . Follow-up    On hypertension and recheck labs. PT reports her BP has been high since last OV. PT reports around christmas her BP was 191/101.  PT reports during this time she felt weak some tingling in the face some [place] and vomited once. PT states she felt better after vomiting. Pt reports an occasional tightness in the chest that she clams is from asthma. Other than the one day of 191/101. Pt reports her BP has been running around 150/80 at home.    HPI  Maria Sweeney presents for  Follow-up, last saw her in December for transition of care, previous primary care provider Dr. Pamella Pert.  Hypertension: Treated with spironolactone 50 mg daily. Variable readings as above, blood pressure 191/101 around Christmas time. Had been rushing that am to prep for company. Felt funny.  At that time she felt some generalized weakness (no focal weakness) tingling in the face, nausea and one episode of vomiting.  Felt better after an episode of vomiting. No return of symptoms. No new headaches, no residual weakness. Took a dose of leftover metoprolol that day only. Cardiologist in past had recommended keeping on hand. Dr. Penni Homans in [place].  Occasional chest tightness - lower chest/upper abdomen. For years, has not discussed with cardiology. No recent stress test. Tightness in center of chest few times per week, sometime with exertion, not always. Associated dyspnea, no arm radiation. No associated diaphoresis.  Notices after eating as well - feels bloated. Has GERD, some acid reflux with waking up at night - every few months, vomiting with sour belching few times per year.  Taking protonix daily. No missed doses. breakthrough heartburn few times per week. No recent GI eval. No hx of asthma as adult - had as child.  blood pressures running around 150/80 at home  typically. Walking on treadmill ok.  No cough.  Home readings: 149/79 last night. BP Readings from Last 3 Encounters:  06/13/20 139/71  04/12/20 (!) 152/82  03/07/20 130/79   Lab Results  Component Value Date   CREATININE 0.81 03/07/2020   HSV labialis: With flair takes 1000 mg BID for 1 days, then qd for few days.  Flairs about 304 times per year.   Plan on recehck labs from 04/12/20 visit.  On 5000 units vit D qd.   Last vitamin D  Lab Results  Component Value Date   VD25OH 26.6 (L) 12/07/2019   Lab Results  Component Value Date   HGBA1C 6.4 (H) 12/07/2019   Lab Results  Component Value Date   CHOL 202 (H) 12/07/2019   HDL 58 12/07/2019   LDLCALC 109 (H) 12/07/2019   TRIG 203 (H) 12/07/2019   CHOLHDL 3.5 12/07/2019  on crestor.      History Patient Active Problem List   Diagnosis Date Noted  . Encounter for screening colonoscopy   . Vitamin D deficiency 08/02/2018  . Prediabetes 08/02/2018  . Carpal tunnel syndrome 02/10/2018  . Tinnitus 06/25/2016  . Numbness 04/13/2016  . Loss of smell 04/13/2016  . Pure hypercholesterolemia 11/30/2014  . Degenerative disc disease, cervical 04/24/2014  . Essential hypertension, benign 10/19/2013  . Insomnia 10/19/2013  . Herpes labialis 10/19/2013  . Anxiety and depression 07/25/2012  . GERD (gastroesophageal reflux disease) 07/25/2012   Past Medical History:  Diagnosis Date  . Anxiety   .  Arthritis    Phreesia 04/12/2020  . Asthma   . Cataract    Phreesia 10/25/2019  . Cervical spondylosis   . Complication of anesthesia   . Depression   . Depression    Phreesia 04/12/2020  . GERD (gastroesophageal reflux disease)   . Glaucoma    Phreesia 10/25/2019  . Hyperlipidemia   . Hypertension   . IBS (irritable bowel syndrome)    Constipation with diarrhea.  . Oral herpes    Past Surgical History:  Procedure Laterality Date  . ABDOMINAL HYSTERECTOMY  1978   DUB; ovaries intact.  . APPENDECTOMY  1988  .  BREAST BIOPSY Bilateral 2009   Calcium Deposit- 1980's and also in 2009/ Right side 1995  . BREAST EXCISIONAL BIOPSY Bilateral   . BREAST SURGERY N/A    Phreesia 10/25/2019  . COLONOSCOPY  05/11/2009   Normal.  Previous colonoscopy with polyps. [facility].    . COLONOSCOPY WITH PROPOFOL N/A 12/20/2019   Procedure: COLONOSCOPY WITH PROPOFOL;  Surgeon: Lin Landsman, MD;  Location: [facility] ENDOSCOPY;  Service: Gastroenterology;  Laterality: N/A;  . DEBRIDEMENT TENNIS ELBOW    . ROTATOR CUFF REPAIR    . TONSILLECTOMY     Allergies  Allergen Reactions  . Amoxicillin   . Augmentin [Amoxicillin-Pot Clavulanate]   . Codeine   . Depakote [Divalproex Sodium]   . Latex   . Lipitor [Atorvastatin]   . Penicillins   . Septra [Sulfamethoxazole-Trimethoprim]   . Zocor [Simvastatin]    Prior to Admission medications   Medication Sig Start Date End Date Taking? Authorizing Provider  albuterol (PROVENTIL HFA;VENTOLIN HFA) 108 (90 Base) MCG/ACT inhaler Inhale 2 puffs into the lungs every 4 (four) hours as needed for wheezing or shortness of breath (cough, shortness of breath or wheezing.). 02/11/16  Yes Wardell Honour, MD  dorzolamide (TRUSOPT) 2 % ophthalmic solution Place 1 drop into both eyes 2 (two) times daily.   Yes [provider]  dorzolamide-timolol (COSOPT) 22.3-6.8 MG/ML ophthalmic solution  12/25/19  Yes [provider]  FLUoxetine (PROZAC) 20 MG capsule TAKE 3 CAPSULES BY MOUTH DAILY 05/15/20  Yes Wendie Agreste, MD  latanoprost (XALATAN) 0.005 % ophthalmic solution 1 drop at bedtime.   Yes [provider]  mirtazapine (REMERON) 7.5 MG tablet Take 1 tablet (7.5 mg total) by mouth at bedtime as needed. 12/07/19  Yes Jacelyn Pi, Lilia Argue, MD  Omega-3 Fatty Acids (FISH OIL OMEGA-3) 1000 MG CAPS Take by mouth.   Yes [provider]  pantoprazole (PROTONIX) 40 MG tablet TAKE 1 TABLET BY MOUTH ONCE A DAY 10/10/19  Yes Jacelyn Pi, Irma M, MD  rosuvastatin  (CRESTOR) 10 MG tablet Take 1 tablet (10 mg total) by mouth daily. 12/07/19  Yes Jacelyn Pi, Lilia Argue, MD  spironolactone (ALDACTONE) 50 MG tablet Take 1 tablet (50 mg total) by mouth daily. 12/07/19  Yes Jacelyn Pi, Lilia Argue, MD  valACYclovir (VALTREX) 1000 MG tablet Take 1 tablet (1,000 mg total) by mouth 2 (two) times daily. Patient taking differently: Take 1,000 mg by mouth as needed. 06/30/17  Yes Wardell Honour, MD  VITAMIN D PO Take by mouth.   Yes [provider]   Social History   Socioeconomic History  . Marital status: Divorced    Spouse name: Not on file  . Number of children: 2  . Years of education: Bachelors  . Highest education level: Not on file  Occupational History  . Occupation: Chemical engineer  Employer: [employer]    Comment: also works at [employer]  Tobacco Use  . Smoking status: Never Smoker  . Smokeless tobacco: Never Used  Vaping Use  . Vaping Use: Never used  Substance and Sexual Activity  . Alcohol use: No  . Drug use: Never  . Sexual activity: Yes    Partners: Male    Birth control/protection: Surgical, Post-menopausal  Other Topics Concern  . Not on file  Social History Narrative   Marital status: divorced since 1999; dating seriously in 2019 since 2015.      Children: one son, one daughter; 2 grandchildren.      Lives: alone in house with 2 dogs     Employment:  Retired.  Needs to work in 2019.      Tobacco: none      Alcohol: rare      Drugs: none      Exercise:  Sporadic   Right-handed.   Occasional caffeine use.      ADLs: independent with ADLs; drives; no assistant devices.      Advanced Directives: none; FULL CODE.   Social Determinants of Health   Financial Resource Strain: Not on file  Food Insecurity: Not on file  Transportation Needs: Not on file  Physical Activity: Not on file  Stress: Not on file  Social Connections: Not on file  Intimate Partner Violence: Not on file    Review of Systems Per HPI.    Objective:   Vitals:   06/13/20 1058  BP: 139/71  Pulse: 81  Temp: 97.6 F (36.4 C)  TempSrc: Temporal  SpO2: 96%  Weight: 147 lb (66.7 kg)  Height: 5' (1.524 m)     Physical Exam Vitals reviewed.  Constitutional:      Appearance: She is well-developed and well-nourished.  HENT:     Head: Normocephalic and atraumatic.  Eyes:     Extraocular Movements: EOM normal.     Conjunctiva/sclera: Conjunctivae normal.     Pupils: Pupils are equal, round, and reactive to light.  Neck:     Vascular: No carotid bruit.  Cardiovascular:     Rate and Rhythm: Normal rate and regular rhythm.     Pulses: Intact distal pulses.     Heart sounds: Normal heart sounds.  Pulmonary:     Effort: Pulmonary effort is normal.     Breath sounds: Normal breath sounds.  Abdominal:     General: There is no distension.     Palpations: Abdomen is soft. There is no pulsatile mass.     Tenderness: There is no abdominal tenderness. There is no guarding.  Skin:    General: Skin is warm and dry.  Neurological:     Mental Status: She is alert and oriented to person, place, and time.  Psychiatric:        Mood and Affect: Mood and affect normal.        Behavior: Behavior normal.    EKG: EKG sinus rhythm, rate 59.  No apparent acute ST or T wave changes or significant changes from comparison EKG 03/07/2020.  Assessment & Plan:  Maria Sweeney is a 70 y.o. female . Essential hypertension, benign - Plan: Lipid panel, Comprehensive metabolic panel, Ambulatory referral to Cardiology  -Check labs, trial of slight higher dose spironolactone at 75 mg total dosing per day.  Depending on control can send in refills. Return to 50mg  if needed.   Chest tightness - Plan: Ambulatory referral to Cardiology, EKG 12-Lead, Ambulatory referral to  Gastroenterology Gastroesophageal reflux disease, unspecified whether esophagitis present - Plan: Ambulatory referral to Gastroenterology  -There is symptoms, sometimes  exertional, sometimes atypical.  May be reflux component.  No apparent acute findings or changes on EKG.  Avoid trigger foods, handout given.  Refer to cardiology.  Avoid exertional exercise for now.  Also refer to gastroenterology given the breakthrough symptoms, continue PPI daily  Vitamin D deficiency - Plan: VITAMIN D 25 Hydroxy (Vit-D Deficiency, Fractures)  -Continue higher dose vitamin D, check vitamin D.  Prediabetes - Plan: Hemoglobin A1c  Pure hypercholesterolemia  -labs as above. No med changes.   Herpes labialis without complication - Plan: valACYclovir (VALTREX) 1000 MG tablet  -Dosing instructions for flares given.   Meds ordered this encounter  Medications  . valACYclovir (VALTREX) 1000 MG tablet    Sig: Take 2 tablets (2,000 mg total) by mouth 2 (two) times daily for 2 doses.    Dispense:  16 tablet    Refill:  1   Patient Instructions     Stay on protonix daily.  See list of foods to avoid.  I will refer you to gastroenterology as well for heartburn.   Increase spirinolactone to 1 and 1/2 pill for now. If low blood pressure readings or lightheadedness/dizziness, return to 1 pill/day and let me know. Either way let me know which dose works for you and I can send in more refills.   I will refer you to cardiologist for chest tightness. Avoid exertional activity for now but walking is okay. If any worsening chest pain or tightness be seen right away.   Valtrex 2 pills once, then repeat dose once in 12 hours if flair of cold sore.   Return to the clinic or go to the nearest emergency room if any of your symptoms worsen or new symptoms occur.   Food Choices for Gastroesophageal Reflux Disease, Adult When you have gastroesophageal reflux disease (GERD), the foods you eat and your eating habits are very important. Choosing the right foods can help ease the discomfort of GERD. Consider working with a dietitian to help you make healthy food choices. What are tips for  following this plan? Reading food labels  Look for foods that are low in saturated fat. Foods that have less than 5% of daily value (DV) of fat and 0 g of trans fats may help with your symptoms. Cooking  Cook foods using methods other than frying. This may include baking, steaming, grilling, or broiling. These are all methods that do not need a lot of fat for cooking.  To add flavor, try to use herbs that are low in spice and acidity. Meal planning  Choose healthy foods that are low in fat, such as fruits, vegetables, whole grains, low-fat dairy products, lean meats, fish, and poultry.  Eat frequent, small meals instead of three large meals each day. Eat your meals slowly, in a relaxed setting. Avoid bending over or lying down until 2-3 hours after eating.  Limit high-fat foods such as fatty meats or fried foods.  Limit your intake of fatty foods, such as oils, butter, and shortening.  Avoid the following as told by your health care provider: ? Foods that cause symptoms. These may be different for different people. Keep a food diary to keep track of foods that cause symptoms. ? Alcohol. ? Drinking large amounts of liquid with meals. ? Eating meals during the 2-3 hours before bed.   Lifestyle  Maintain a healthy weight. Ask your health care  provider what weight is healthy for you. If you need to lose weight, work with your health care provider to do so safely.  Exercise for at least 30 minutes on 5 or more days each week, or as told by your health care provider.  Avoid wearing clothes that fit tightly around your waist and chest.  Do not use any products that contain nicotine or tobacco. These products include cigarettes, chewing tobacco, and vaping devices, such as e-cigarettes. If you need help quitting, ask your health care provider.  Sleep with the head of your bed raised. Use a wedge under the mattress or blocks under the bed frame to raise the head of the bed.  Chew  sugar-free gum after mealtimes. What foods should I eat? Eat a healthy, well-balanced diet of fruits, vegetables, whole grains, low-fat dairy products, lean meats, fish, and poultry. Each person is different. Foods that may trigger symptoms in one person may not trigger any symptoms in another person. Work with your health care provider to identify foods that are safe for you. The items listed above may not be a complete list of recommended foods and beverages. Contact a dietitian for more information.   What foods should I avoid? Limiting some of these foods may help manage the symptoms of GERD. Everyone is different. Consult a dietitian or your health care provider to help you identify the exact foods to avoid, if any. Fruits Any fruits prepared with added fat. Any fruits that cause symptoms. For some people this may include citrus fruits, such as oranges, grapefruit, pineapple, and lemons. Vegetables Deep-fried vegetables. [place] fries. Any vegetables prepared with added fat. Any vegetables that cause symptoms. For some people, this may include tomatoes and tomato products, chili peppers, onions and garlic, and horseradish. Grains Pastries or quick breads with added fat. Meats and other proteins High-fat meats, such as fatty beef or pork, hot dogs, ribs, ham, sausage, salami, and bacon. Fried meat or protein, including fried fish and fried chicken. Nuts and nut butters, in large amounts. Dairy Whole milk and chocolate milk. Sour cream. Cream. Ice cream. Cream cheese. Milkshakes. Fats and oils Butter. Margarine. Shortening. Ghee. Beverages Coffee and tea, with or without caffeine. Carbonated beverages. Sodas. Energy drinks. Fruit juice made with acidic fruits, such as orange or grapefruit. Tomato juice. Alcoholic drinks. Sweets and desserts Chocolate and cocoa. Donuts. Seasonings and condiments Pepper. Peppermint and spearmint. Added salt. Any condiments, herbs, or seasonings that cause  symptoms. For some people, this may include curry, hot sauce, or vinegar-based salad dressings. The items listed above may not be a complete list of foods and beverages to avoid. Contact a dietitian for more information. Questions to ask your health care provider Diet and lifestyle changes are usually the first steps that are taken to manage symptoms of GERD. If diet and lifestyle changes do not improve your symptoms, talk with your health care provider about taking medicines. Where to find more information  International Foundation for Gastrointestinal Disorders: aboutgerd.org Summary  When you have gastroesophageal reflux disease (GERD), food and lifestyle choices may be very helpful in easing the discomfort of GERD.  Eat frequent, small meals instead of three large meals each day. Eat your meals slowly, in a relaxed setting. Avoid bending over or lying down until 2-3 hours after eating.  Limit high-fat foods such as fatty meats or fried foods. This information is not intended to replace advice given to you by your health care provider. Make sure you discuss any questions  you have with your health care provider. Document Revised: 11/06/2019 Document Reviewed: 11/06/2019 Elsevier Patient Education  2021 [place].   Nonspecific Chest Pain, Adult Chest pain can be caused by many different conditions. It can be caused by a condition that is life-threatening and requires treatment right away. It can also be caused by something that is not life-threatening. If you have chest pain, it can be hard to know the difference, so it is important to get help right away to make sure that you do not have a serious condition. Some life-threatening causes of chest pain include:  Heart attack.  A tear in the body's main blood vessel (aortic dissection).  Inflammation around your heart (pericarditis).  A problem in the lungs, such as a blood clot (pulmonary embolism) or a collapsed lung  (pneumothorax). Some non life-threatening causes of chest pain include:  Heartburn.  Anxiety or stress.  Damage to the bones, muscles, and cartilage that make up your chest wall.  Pneumonia or bronchitis.  Shingles infection (varicella-zoster virus). Chest pain can feel like:  Pain or discomfort on the surface of your chest or deep in your chest.  Crushing, pressure, aching, or squeezing pain.  Burning or tingling.  Dull or sharp pain that is worse when you move, cough, or take a deep breath.  Pain or discomfort that is also felt in your back, neck, jaw, shoulder, or arm, or pain that spreads to any of these areas. Your chest pain may come and go. It may also be constant. Your health care provider will do lab tests and other studies to find the cause of your pain. Treatment will depend on the cause of your chest pain. Follow these instructions at home: Medicines  Take over-the-counter and prescription medicines only as told by your health care provider.  If you were prescribed an antibiotic, take it as told by your health care provider. Do not stop taking the antibiotic even if you start to feel better. Lifestyle  Rest as directed by your health care provider.  Do not use any products that contain nicotine or tobacco, such as cigarettes and e-cigarettes. If you need help quitting, ask your health care provider.  Do not drink alcohol.  Make healthy lifestyle choices as recommended. These may include: ? Getting regular exercise. Ask your health care provider to suggest some activities that are safe for you. ? Eating a heart-healthy diet. This includes plenty of fresh fruits and vegetables, whole grains, low-fat (lean) protein, and low-fat dairy products. A dietitian can help you find healthy eating options. ? Maintaining a healthy weight. ? Managing any other health conditions you have, such as high blood pressure (hypertension) or diabetes. ? Reducing stress, such as with  yoga or relaxation techniques.   General instructions  Pay attention to any changes in your symptoms. Tell your health care provider about them or any new symptoms.  Avoid any activities that cause chest pain.  Keep all follow-up visits as told by your health care provider. This is important. This includes visits for any further testing if your chest pain does not go away. Contact a health care provider if:  Your chest pain does not go away.  You feel depressed.  You have a fever. Get help right away if:  Your chest pain gets worse.  You have a cough that gets worse, or you cough up blood.  You have severe pain in your abdomen.  You faint.  You have sudden, unexplained chest discomfort.  You  have sudden, unexplained discomfort in your arms, back, neck, or jaw.  You have shortness of breath at any time.  You suddenly start to sweat, or your skin gets clammy.  You feel nausea or you vomit.  You suddenly feel lightheaded or dizzy.  You have severe weakness, or unexplained weakness or fatigue.  Your heart begins to beat quickly, or it feels like it is skipping beats. These symptoms may represent a serious problem that is an emergency. Do not wait to see if the symptoms will go away. Get medical help right away. Call your local emergency services (911 in the [place] Do not drive yourself to the hospital. Summary  Chest pain can be caused by a condition that is serious and requires urgent treatment. It may also be caused by something that is not life-threatening.  If you have chest pain, it is very important to see your health care provider. Your health care provider may do lab tests and other studies to find the cause of your pain.  Follow your health care provider's instructions on taking medicines, making lifestyle changes, and getting emergency treatment if symptoms become worse.  Keep all follow-up visits as told by your health care provider. This includes visits for  any further testing if your chest pain does not go away. This information is not intended to replace advice given to you by your health care provider. Make sure you discuss any questions you have with your health care provider. Document Revised: 10/28/2017 Document Reviewed: 10/28/2017 Elsevier Patient Education  2021 [employer].    If you have lab work done today you will be contacted with your lab results within the next 2 weeks.  If you have not heard from [place] then please contact us. The fastest way to get your results is to register for My Chart.   IF you received an x-ray today, you will receive an invoice from [facility]. Please contact [facility] at [phone] with questions or concerns regarding your invoice.   IF you received labwork today, you will receive an invoice from [facility]. Please contact [facility] at [phone] with questions or concerns regarding your invoice.   Our billing staff will not be able to assist you with questions regarding bills from these companies.  You will be contacted with the lab results as soon as they are available. The fastest way to get your results is to activate your My Chart account. Instructions are located on the last page of this paperwork. If you have not heard from [place] regarding the results in 2 weeks, please contact this office.         Signed, Merri Ray, MD [facility]

## 2020-06-13 NOTE — Patient Instructions (Addendum)
Stay on protonix daily.  See list of foods to avoid.  I will refer you to gastroenterology as well for heartburn.   Increase spirinolactone to 1 and 1/2 pill for now. If low blood pressure readings or lightheadedness/dizziness, return to 1 pill/day and let me know. Either way let me know which dose works for you and I can send in more refills.   I will refer you to cardiologist for chest tightness. Avoid exertional activity for now but walking is okay. If any worsening chest pain or tightness be seen right away.   Valtrex 2 pills once, then repeat dose once in 12 hours if flair of cold sore.   Return to the clinic or go to the nearest emergency room if any of your symptoms worsen or new symptoms occur.   Food Choices for Gastroesophageal Reflux Disease, Adult When you have gastroesophageal reflux disease (GERD), the foods you eat and your eating habits are very important. Choosing the right foods can help ease the discomfort of GERD. Consider working with a dietitian to help you make healthy food choices. What are tips for following this plan? Reading food labels  Look for foods that are low in saturated fat. Foods that have less than 5% of daily value (DV) of fat and 0 g of trans fats may help with your symptoms. Cooking  Cook foods using methods other than frying. This may include baking, steaming, grilling, or broiling. These are all methods that do not need a lot of fat for cooking.  To add flavor, try to use herbs that are low in spice and acidity. Meal planning  Choose healthy foods that are low in fat, such as fruits, vegetables, whole grains, low-fat dairy products, lean meats, fish, and poultry.  Eat frequent, small meals instead of three large meals each day. Eat your meals slowly, in a relaxed setting. Avoid bending over or lying down until 2-3 hours after eating.  Limit high-fat foods such as fatty meats or fried foods.  Limit your intake of fatty foods, such as oils,  butter, and shortening.  Avoid the following as told by your health care provider: ? Foods that cause symptoms. These may be different for different people. Keep a food diary to keep track of foods that cause symptoms. ? Alcohol. ? Drinking large amounts of liquid with meals. ? Eating meals during the 2-3 hours before bed.   Lifestyle  Maintain a healthy weight. Ask your health care provider what weight is healthy for you. If you need to lose weight, work with your health care provider to do so safely.  Exercise for at least 30 minutes on 5 or more days each week, or as told by your health care provider.  Avoid wearing clothes that fit tightly around your waist and chest.  Do not use any products that contain nicotine or tobacco. These products include cigarettes, chewing tobacco, and vaping devices, such as e-cigarettes. If you need help quitting, ask your health care provider.  Sleep with the head of your bed raised. Use a wedge under the mattress or blocks under the bed frame to raise the head of the bed.  Chew sugar-free gum after mealtimes. What foods should I eat? Eat a healthy, well-balanced diet of fruits, vegetables, whole grains, low-fat dairy products, lean meats, fish, and poultry. Each person is different. Foods that may trigger symptoms in one person may not trigger any symptoms in another person. Work with your health care provider to identify foods  that are safe for you. The items listed above may not be a complete list of recommended foods and beverages. Contact a dietitian for more information.   What foods should I avoid? Limiting some of these foods may help manage the symptoms of GERD. Everyone is different. Consult a dietitian or your health care provider to help you identify the exact foods to avoid, if any. Fruits Any fruits prepared with added fat. Any fruits that cause symptoms. For some people this may include citrus fruits, such as oranges, grapefruit, pineapple,  and lemons. Vegetables Deep-fried vegetables. [place] fries. Any vegetables prepared with added fat. Any vegetables that cause symptoms. For some people, this may include tomatoes and tomato products, chili peppers, onions and garlic, and horseradish. Grains Pastries or quick breads with added fat. Meats and other proteins High-fat meats, such as fatty beef or pork, hot dogs, ribs, ham, sausage, salami, and bacon. Fried meat or protein, including fried fish and fried chicken. Nuts and nut butters, in large amounts. Dairy Whole milk and chocolate milk. Sour cream. Cream. Ice cream. Cream cheese. Milkshakes. Fats and oils Butter. Margarine. Shortening. Ghee. Beverages Coffee and tea, with or without caffeine. Carbonated beverages. Sodas. Energy drinks. Fruit juice made with acidic fruits, such as orange or grapefruit. Tomato juice. Alcoholic drinks. Sweets and desserts Chocolate and cocoa. Donuts. Seasonings and condiments Pepper. Peppermint and spearmint. Added salt. Any condiments, herbs, or seasonings that cause symptoms. For some people, this may include curry, hot sauce, or vinegar-based salad dressings. The items listed above may not be a complete list of foods and beverages to avoid. Contact a dietitian for more information. Questions to ask your health care provider Diet and lifestyle changes are usually the first steps that are taken to manage symptoms of GERD. If diet and lifestyle changes do not improve your symptoms, talk with your health care provider about taking medicines. Where to find more information  International Foundation for Gastrointestinal Disorders: aboutgerd.org Summary  When you have gastroesophageal reflux disease (GERD), food and lifestyle choices may be very helpful in easing the discomfort of GERD.  Eat frequent, small meals instead of three large meals each day. Eat your meals slowly, in a relaxed setting. Avoid bending over or lying down until 2-3 hours after  eating.  Limit high-fat foods such as fatty meats or fried foods. This information is not intended to replace advice given to you by your health care provider. Make sure you discuss any questions you have with your health care provider. Document Revised: 11/06/2019 Document Reviewed: 11/06/2019 Elsevier Patient Education  2021 [place].   Nonspecific Chest Pain, Adult Chest pain can be caused by many different conditions. It can be caused by a condition that is life-threatening and requires treatment right away. It can also be caused by something that is not life-threatening. If you have chest pain, it can be hard to know the difference, so it is important to get help right away to make sure that you do not have a serious condition. Some life-threatening causes of chest pain include:  Heart attack.  A tear in the body's main blood vessel (aortic dissection).  Inflammation around your heart (pericarditis).  A problem in the lungs, such as a blood clot (pulmonary embolism) or a collapsed lung (pneumothorax). Some non life-threatening causes of chest pain include:  Heartburn.  Anxiety or stress.  Damage to the bones, muscles, and cartilage that make up your chest wall.  Pneumonia or bronchitis.  Shingles infection (varicella-zoster virus).  Chest pain can feel like:  Pain or discomfort on the surface of your chest or deep in your chest.  Crushing, pressure, aching, or squeezing pain.  Burning or tingling.  Dull or sharp pain that is worse when you move, cough, or take a deep breath.  Pain or discomfort that is also felt in your back, neck, jaw, shoulder, or arm, or pain that spreads to any of these areas. Your chest pain may come and go. It may also be constant. Your health care provider will do lab tests and other studies to find the cause of your pain. Treatment will depend on the cause of your chest pain. Follow these instructions at home: Medicines  Take  over-the-counter and prescription medicines only as told by your health care provider.  If you were prescribed an antibiotic, take it as told by your health care provider. Do not stop taking the antibiotic even if you start to feel better. Lifestyle  Rest as directed by your health care provider.  Do not use any products that contain nicotine or tobacco, such as cigarettes and e-cigarettes. If you need help quitting, ask your health care provider.  Do not drink alcohol.  Make healthy lifestyle choices as recommended. These may include: ? Getting regular exercise. Ask your health care provider to suggest some activities that are safe for you. ? Eating a heart-healthy diet. This includes plenty of fresh fruits and vegetables, whole grains, low-fat (lean) protein, and low-fat dairy products. A dietitian can help you find healthy eating options. ? Maintaining a healthy weight. ? Managing any other health conditions you have, such as high blood pressure (hypertension) or diabetes. ? Reducing stress, such as with yoga or relaxation techniques.   General instructions  Pay attention to any changes in your symptoms. Tell your health care provider about them or any new symptoms.  Avoid any activities that cause chest pain.  Keep all follow-up visits as told by your health care provider. This is important. This includes visits for any further testing if your chest pain does not go away. Contact a health care provider if:  Your chest pain does not go away.  You feel depressed.  You have a fever. Get help right away if:  Your chest pain gets worse.  You have a cough that gets worse, or you cough up blood.  You have severe pain in your abdomen.  You faint.  You have sudden, unexplained chest discomfort.  You have sudden, unexplained discomfort in your arms, back, neck, or jaw.  You have shortness of breath at any time.  You suddenly start to sweat, or your skin gets clammy.  You  feel nausea or you vomit.  You suddenly feel lightheaded or dizzy.  You have severe weakness, or unexplained weakness or fatigue.  Your heart begins to beat quickly, or it feels like it is skipping beats. These symptoms may represent a serious problem that is an emergency. Do not wait to see if the symptoms will go away. Get medical help right away. Call your local emergency services (911 in the [place] Do not drive yourself to the hospital. Summary  Chest pain can be caused by a condition that is serious and requires urgent treatment. It may also be caused by something that is not life-threatening.  If you have chest pain, it is very important to see your health care provider. Your health care provider may do lab tests and other studies to find the cause of your pain.  Follow your health care provider's instructions on taking medicines, making lifestyle changes, and getting emergency treatment if symptoms become worse.  Keep all follow-up visits as told by your health care provider. This includes visits for any further testing if your chest pain does not go away. This information is not intended to replace advice given to you by your health care provider. Make sure you discuss any questions you have with your health care provider. Document Revised: 10/28/2017 Document Reviewed: 10/28/2017 Elsevier Patient Education  2021 [employer].    If you have lab work done today you will be contacted with your lab results within the next 2 weeks.  If you have not heard from [place] then please contact us. The fastest way to get your results is to register for My Chart.   IF you received an x-ray today, you will receive an invoice from [facility]. Please contact [facility] at [phone] with questions or concerns regarding your invoice.   IF you received labwork today, you will receive an invoice from [facility]. Please contact [facility] at [phone] with questions or concerns  regarding your invoice.   Our billing staff will not be able to assist you with questions regarding bills from these companies.  You will be contacted with the lab results as soon as they are available. The fastest way to get your results is to activate your My Chart account. Instructions are located on the last page of this paperwork. If you have not heard from [place] regarding the results in 2 weeks, please contact this office.

## 2020-06-14 LAB — VITAMIN D 25 HYDROXY (VIT D DEFICIENCY, FRACTURES): Vit D, 25-Hydroxy: 42.9 ng/mL (ref 30.0–100.0)

## 2020-06-14 LAB — COMPREHENSIVE METABOLIC PANEL
ALT: 47 IU/L — ABNORMAL HIGH (ref 0–32)
AST: 35 IU/L (ref 0–40)
Albumin/Globulin Ratio: 1.9 (ref 1.2–2.2)
Albumin: 4.7 g/dL (ref 3.8–4.8)
Alkaline Phosphatase: 115 IU/L (ref 44–121)
BUN/Creatinine Ratio: 15 (ref 12–28)
BUN: 12 mg/dL (ref 8–27)
Bilirubin Total: 0.7 mg/dL (ref 0.0–1.2)
CO2: 21 mmol/L (ref 20–29)
Calcium: 9.9 mg/dL (ref 8.7–10.3)
Chloride: 104 mmol/L (ref 96–106)
Creatinine, Ser: 0.81 mg/dL (ref 0.57–1.00)
GFR calc Af Amer: 85 mL/min/{1.73_m2} (ref >59)
GFR calc non Af Amer: 74 mL/min/{1.73_m2} (ref >59)
Globulin, Total: 2.5 g/dL (ref 1.5–4.5)
Glucose: 97 mg/dL (ref 65–99)
Potassium: 4.8 mmol/L (ref 3.5–5.2)
Sodium: 141 mmol/L (ref 134–144)
Total Protein: 7.2 g/dL (ref 6.0–8.5)

## 2020-06-14 LAB — HEMOGLOBIN A1C
Est. average glucose Bld gHb Est-mCnc: 146 mg/dL
Hgb A1c MFr Bld: 6.7 % — ABNORMAL HIGH (ref 4.8–5.6)

## 2020-06-14 LAB — LIPID PANEL
Chol/HDL Ratio: 3.7 ratio (ref 0.0–4.4)
Cholesterol, Total: 187 mg/dL (ref 100–199)
HDL: 51 mg/dL (ref >39)
LDL Chol Calc (NIH): 98 mg/dL (ref 0–99)
Triglycerides: 227 mg/dL — ABNORMAL HIGH (ref 0–149)
VLDL Cholesterol Cal: 38 mg/dL (ref 5–40)

## 2020-06-15 ENCOUNTER — Other Ambulatory Visit: Payer: Self-pay | Admitting: Family Medicine

## 2020-06-15 DIAGNOSIS — F419 Anxiety disorder, unspecified: Secondary | ICD-10-CM

## 2020-06-15 DIAGNOSIS — F32A Depression, unspecified: Secondary | ICD-10-CM

## 2020-06-15 NOTE — Telephone Encounter (Signed)
Requested Prescriptions  Pending Prescriptions Disp Refills  . FLUoxetine (PROZAC) 20 MG capsule [Pharmacy Med Name: FLUOXETINE HCL 20 MG CAP] 90 capsule 0    Sig: TAKE 3 CAPSULES BY MOUTH DAILY     Psychiatry:  Antidepressants - SSRI Passed - 06/15/2020  9:06 AM      Passed - Completed PHQ-2 or PHQ-9 in the last 360 days      Passed - Valid encounter within last 6 months    Recent Outpatient Visits          2 days ago Essential hypertension, benign   [facility] at [facility], Ranell Patrick, MD   2 months ago Trigger ring finger of right hand   [facility] at [place], MD   6 months ago Essential hypertension, benign   Primary Care at [facility], Lilia Argue, MD   7 months ago Screening for colon cancer   Primary Care at [facility], Lilia Argue, MD   1 year ago Essential hypertension, benign   Primary Care at [facility], Lilia Argue, MD      Future Appointments            In 4 days Hartley, Kathlene November, MD [facility], [facility]   In 2 months Carlota Raspberry, Ranell Patrick, MD Primary Care at [place], [facility]

## 2020-06-19 ENCOUNTER — Other Ambulatory Visit: Payer: Self-pay

## 2020-06-19 ENCOUNTER — Ambulatory Visit: Payer: PPO | Admitting: Cardiovascular Disease

## 2020-06-19 ENCOUNTER — Encounter: Payer: Self-pay | Admitting: Cardiovascular Disease

## 2020-06-19 VITALS — BP 134/80 | HR 66 | Ht 60.0 in | Wt 147.0 lb

## 2020-06-19 DIAGNOSIS — E7800 Pure hypercholesterolemia, unspecified: Secondary | ICD-10-CM

## 2020-06-19 DIAGNOSIS — F32A Depression, unspecified: Secondary | ICD-10-CM

## 2020-06-19 DIAGNOSIS — F419 Anxiety disorder, unspecified: Secondary | ICD-10-CM

## 2020-06-19 DIAGNOSIS — I10 Essential (primary) hypertension: Secondary | ICD-10-CM

## 2020-06-19 MED ORDER — HYDRALAZINE HCL 25 MG PO TABS: 25.0000 mg | ORAL_TABLET | Freq: Three times a day (TID) | ORAL | 1 refills | Status: DC | PRN

## 2020-06-19 NOTE — Patient Instructions (Signed)
Medication Instructions:  Try hydralazine as needed for pressure > 180  Compare wrist cuff to other BP cuffs  If you need a refill on your cardiac medications before your next appointment, please call your pharmacy.    Lab work: No new labs needed   If you have labs (blood work) drawn today and your tests are completely normal, you will receive your results only by: Marland Kitchen MyChart Message (if you have MyChart) OR . A paper copy in the mail If you have any lab test that is abnormal or we need to change your treatment, we will call you to review the results.   Testing/Procedures: No new testing needed   Follow-Up: At [facility], you and your health needs are our priority.  As part of our continuing mission to provide you with exceptional heart care, we have created designated Provider Care Teams.  These Care Teams include your primary Cardiologist (physician) and Advanced Practice Providers (APPs -  Physician Assistants and Nurse Practitioners) who all work together to provide you with the care you need, when you need it.  . You will need a follow up appointment in 12 months  . Providers on your designated Care Team:   . Murray Hodgkins, NP . Christell Faith, PA-C . Marrianne Mood, PA-C  Any Other Special Instructions Will Be Listed Below (If Applicable).  COVID-19 Vaccine Information can be found at: ShippingScam.co.uk For questions related to vaccine distribution or appointments, please email [email or web address] or call [phone].

## 2020-06-19 NOTE — Progress Notes (Signed)
Cardiology Office Note  Date:  06/19/2020   ID:  Maria Sweeney, DOB Jan 27, 1951, MRN [id number]  PCP:  Wendie Agreste, MD   Chief Complaint  Patient presents with  . OTher    Patient c.I chest tightness and Elevated BP. Meds reviewed verbally with patient.,     HPI:  70 yo F with PMH of HTN  HLP , intolerance to simvastatin/liptior Anxiety/depression F/u of her  progressive DOE, HTN  Last seen in clinic by one of our providers: 03/2019  In follow-up today reports that she goes to the gym with her boyfriend 3-4 times a week Reports blood pressure well controlled at home 242 systolic today, also was in the 130s with primary care Feels it is higher at home  When very active such as push mowing, blood pressure seems to run higher  In general reports anxiety is improved On prior office visit reported having shortness of breath with any exertion, was nervous to go exercising She had poor energy, poor sleep, the symptoms have improved  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  PSH:    Past Surgical History:  Procedure Laterality Date  . ABDOMINAL HYSTERECTOMY  1978   DUB; ovaries intact.  . APPENDECTOMY  1988  . BREAST BIOPSY Bilateral 2009   Calcium Deposit- 1980's and also in 2009/ Right side 1995  . BREAST EXCISIONAL BIOPSY Bilateral   . BREAST SURGERY N/A    Phreesia 10/25/2019  . COLONOSCOPY  05/11/2009   Normal.  Previous colonoscopy with polyps. [facility].    . COLONOSCOPY WITH PROPOFOL N/A 12/20/2019   Procedure:  COLONOSCOPY WITH PROPOFOL;  Surgeon: Lin Landsman, MD;  Location[facility] ENDOSCOPY;  Service: Gastroenterology;  Laterality: N/A;  . DEBRIDEMENT TENNIS ELBOW    . ROTATOR CUFF REPAIR    . TONSILLECTOMY      Current Outpatient Medications  Medication Sig Dispense Refill  . albuterol (PROVENTIL HFA;VENTOLIN HFA) 108 (90 Base) MCG/ACT inhaler Inhale 2 puffs into the lungs every 4 (four) hours as needed for wheezing or shortness of breath (cough, shortness of breath or wheezing.). 1 Inhaler 1  . dorzolamide (TRUSOPT) 2 % ophthalmic solution Place 1 drop into both eyes 2 (two) times daily.    . dorzolamide-timolol (COSOPT) 22.3-6.8 MG/ML ophthalmic solution     . FLUoxetine (PROZAC) 20 MG capsule TAKE 3 CAPSULES BY MOUTH DAILY 90 capsule 0  . hydrALAZINE (APRESOLINE) 25 MG tablet Take 1 tablet (25 mg total) by mouth 3 (three) times daily as needed. 90 tablet 1  . latanoprost (XALATAN) 0.005 % ophthalmic solution 1 drop at bedtime.    . mirtazapine (REMERON) 7.5 MG tablet Take 1 tablet (7.5 mg total) by mouth at bedtime as needed. 30 tablet 2  . Omega-3 Fatty Acids (FISH OIL OMEGA-3) 1000 MG CAPS Take by mouth.    . pantoprazole (PROTONIX) 40 MG tablet TAKE 1 TABLET BY MOUTH ONCE A DAY 90 tablet 3  . rosuvastatin (CRESTOR) 10 MG tablet Take 1 tablet (10 mg total) by mouth daily. 90 tablet 2  . spironolactone (ALDACTONE) 50 MG tablet Take one and  a half tablets (75MG ) by mouth daily.    Marland Kitchen VITAMIN D PO Take by mouth.     No current facility-administered medications for this visit.     Allergies:   Amoxicillin, Augmentin [amoxicillin-pot clavulanate], Codeine, Depakote [divalproex sodium], Latex, Lipitor [atorvastatin], Penicillins, Septra [sulfamethoxazole-trimethoprim], and Zocor [simvastatin]   Social History:  The patient  reports that she has never smoked. She has never used smokeless tobacco. She reports that she does not drink alcohol and does not use drugs.   Family History:   family  history includes Asthma in her father; Breast cancer in her paternal grandmother; COPD in her father; Cancer in her mother; Diabetes in her mother; Emphysema in her father; Glaucoma in her father and mother; Heart disease in her mother; Hyperlipidemia in her mother; Hypertension in her mother and sister; Stroke (age of onset: 28) in her mother.    Review of Systems: Review of Systems  Constitutional: Negative.   HENT: Negative.   Respiratory: Positive for shortness of breath.   Cardiovascular: Negative.   Gastrointestinal: Negative.   Musculoskeletal: Negative.   Neurological: Negative.   Psychiatric/Behavioral: The patient is nervous/anxious.   All other systems reviewed and are negative.   PHYSICAL EXAM: VS:  BP 134/80 (BP Location: Left Arm, Patient Position: Sitting, Cuff Size: Normal)   Pulse 66   Ht 5' (1.524 m)   Wt 147 lb (66.7 kg)   SpO2 98%   BMI 28.71 kg/m  , BMI Body mass index is 28.71 kg/m. GEN: Well nourished, well developed, in no acute distress HEENT: normal Neck: no JVD, carotid bruits, or masses Cardiac: RRR; no murmurs, rubs, or gallops,no edema  Respiratory:  clear to auscultation bilaterally, normal work of breathing GI: soft, nontender, nondistended, + BS MS: no deformity or atrophy Skin: warm and dry, no rash Neuro:  Strength and sensation are intact Psych: euthymic mood, full affect  Recent Labs: 12/07/2019: TSH 2.250 03/07/2020: Hemoglobin 13.5; Platelets 246 06/13/2020: ALT 47; BUN 12; Creatinine, Ser 0.81; Potassium 4.8; Sodium 141    Lipid Panel Lab Results  Component Value Date   CHOL 187 06/13/2020   HDL 51 06/13/2020   LDLCALC 98 06/13/2020   TRIG 227 (H) 06/13/2020      Wt Readings from Last 3 Encounters:  06/19/20 147 lb (66.7 kg)  06/13/20 147 lb (66.7 kg)  04/12/20 144 lb (65.3 kg)     ASSESSMENT AND PLAN:  Problem List Items Addressed This Visit      Cardiology Problems   Pure hypercholesterolemia   Relevant Medications    spironolactone (ALDACTONE) 50 MG tablet   hydrALAZINE (APRESOLINE) 25 MG tablet   Other Relevant Orders   EKG 12-Lead   Essential hypertension, benign - Primary   Relevant Medications   spironolactone (ALDACTONE) 50 MG tablet   hydrALAZINE (APRESOLINE) 25 MG tablet   Other Relevant Orders   EKG 12-Lead     Other   Anxiety and depression     Shortness of breath Dramatic improvement in symptoms Going to the gym 3-4 times a week with her boyfriend Recommend she continue her exercise program  Anxiety Recent episode Christmas 2021, sounds like she may have over done it, preparing for guests Though in general has been doing better, exercising Sleeping better  Hyperlipidemia Tolerating statin  Hypertension Recent spike in blood pressure Christmas 2021 in the setting of stress Did not tolerate metoprolol, she would like a different medication for blood pressure that she can take as needed She is  happy to stay on spironolactone on a regular basis, systolic pressures 415A, discussed confirming that her blood pressures calibrated/accurate -Hydralazine 25 mg 3 times daily as needed for pressure over 180 In the situations also may need an anxiety pill.  She will talk with primary care     Total encounter time more than 25 minutes  Greater than 50% was spent in counseling and coordination of care with the patient    Signed, Esmond Plants, M.D., Ph.D. [place], [place]

## 2020-07-05 DIAGNOSIS — D2261 Melanocytic nevi of right upper limb, including shoulder: Secondary | ICD-10-CM | POA: Diagnosis not present

## 2020-07-05 DIAGNOSIS — L820 Inflamed seborrheic keratosis: Secondary | ICD-10-CM | POA: Diagnosis not present

## 2020-07-05 DIAGNOSIS — L821 Other seborrheic keratosis: Secondary | ICD-10-CM | POA: Diagnosis not present

## 2020-07-05 DIAGNOSIS — D2271 Melanocytic nevi of right lower limb, including hip: Secondary | ICD-10-CM | POA: Diagnosis not present

## 2020-07-05 DIAGNOSIS — D2272 Melanocytic nevi of left lower limb, including hip: Secondary | ICD-10-CM | POA: Diagnosis not present

## 2020-07-05 DIAGNOSIS — L538 Other specified erythematous conditions: Secondary | ICD-10-CM | POA: Diagnosis not present

## 2020-07-05 DIAGNOSIS — Z85828 Personal history of other malignant neoplasm of skin: Secondary | ICD-10-CM | POA: Diagnosis not present

## 2020-07-22 ENCOUNTER — Other Ambulatory Visit: Payer: Self-pay | Admitting: Family Medicine

## 2020-07-22 DIAGNOSIS — F32A Depression, unspecified: Secondary | ICD-10-CM

## 2020-07-22 DIAGNOSIS — F419 Anxiety disorder, unspecified: Secondary | ICD-10-CM

## 2020-08-19 DIAGNOSIS — H401132 Primary open-angle glaucoma, bilateral, moderate stage: Secondary | ICD-10-CM | POA: Diagnosis not present

## 2020-08-26 DIAGNOSIS — H401132 Primary open-angle glaucoma, bilateral, moderate stage: Secondary | ICD-10-CM | POA: Diagnosis not present

## 2020-08-26 DIAGNOSIS — H2513 Age-related nuclear cataract, bilateral: Secondary | ICD-10-CM | POA: Diagnosis not present

## 2020-08-30 ENCOUNTER — Other Ambulatory Visit: Payer: Self-pay | Admitting: Family Medicine

## 2020-08-30 DIAGNOSIS — F32A Depression, unspecified: Secondary | ICD-10-CM

## 2020-08-30 DIAGNOSIS — F419 Anxiety disorder, unspecified: Secondary | ICD-10-CM

## 2020-08-30 NOTE — Telephone Encounter (Signed)
Requested Prescriptions  Pending Prescriptions Disp Refills  . FLUoxetine (PROZAC) 20 MG capsule [Pharmacy Med Name: FLUOXETINE HCL 20 MG CAP] 90 capsule 0    Sig: TAKE 3 CAPSULES BY MOUTH DAILY     There is no refill protocol information for this order

## 2020-09-11 ENCOUNTER — Ambulatory Visit: Payer: Self-pay | Admitting: Family Medicine

## 2020-10-08 DIAGNOSIS — H2512 Age-related nuclear cataract, left eye: Secondary | ICD-10-CM | POA: Diagnosis not present

## 2020-10-10 ENCOUNTER — Other Ambulatory Visit: Payer: Self-pay | Admitting: Family Medicine

## 2020-10-11 NOTE — Telephone Encounter (Signed)
PREVIOUS RX FROM ANOTHER PROVIDER

## 2020-10-14 ENCOUNTER — Encounter: Payer: Self-pay | Admitting: Ophthalmology

## 2020-10-14 ENCOUNTER — Other Ambulatory Visit: Payer: Self-pay

## 2020-10-17 ENCOUNTER — Telehealth (INDEPENDENT_AMBULATORY_CARE_PROVIDER_SITE_OTHER): Payer: PPO | Admitting: Family Medicine

## 2020-10-17 ENCOUNTER — Encounter: Payer: Self-pay | Admitting: Family Medicine

## 2020-10-17 VITALS — Temp 97.0°F | Wt 144.0 lb

## 2020-10-17 DIAGNOSIS — I10 Essential (primary) hypertension: Secondary | ICD-10-CM

## 2020-10-17 DIAGNOSIS — R7303 Prediabetes: Secondary | ICD-10-CM

## 2020-10-17 DIAGNOSIS — F32A Depression, unspecified: Secondary | ICD-10-CM

## 2020-10-17 DIAGNOSIS — K219 Gastro-esophageal reflux disease without esophagitis: Secondary | ICD-10-CM | POA: Diagnosis not present

## 2020-10-17 DIAGNOSIS — F419 Anxiety disorder, unspecified: Secondary | ICD-10-CM | POA: Diagnosis not present

## 2020-10-17 DIAGNOSIS — E7800 Pure hypercholesterolemia, unspecified: Secondary | ICD-10-CM

## 2020-10-17 MED ORDER — ROSUVASTATIN CALCIUM 10 MG PO TABS: 10.0000 mg | ORAL_TABLET | Freq: Every day | ORAL | 1 refills | Status: DC

## 2020-10-17 MED ORDER — PANTOPRAZOLE SODIUM 40 MG PO TBEC: 1.0000 | DELAYED_RELEASE_TABLET | Freq: Every day | ORAL | 3 refills | Status: DC

## 2020-10-17 MED ORDER — FLUOXETINE HCL 20 MG PO CAPS: 60.0000 mg | ORAL_CAPSULE | Freq: Every day | ORAL | 1 refills | Status: DC

## 2020-10-17 NOTE — Patient Instructions (Addendum)
Pepcid at night, continue protonix, but need to see gastroenterology. We referred you in February, so call for appointment. Let me know if they need a new referral. : [phone]    Return to the clinic or go to the nearest emergency room if any of your symptoms worsen or new symptoms occur.

## 2020-10-17 NOTE — Progress Notes (Signed)
Virtual Visit via Video Note  I connected with Maria Sweeney on 10/17/20 at 5:13 PM by a video enabled telemedicine application and verified that I am speaking with the correct person using two identifiers.  Patient location: home My location: office - [facility]   I discussed the limitations, risks, security and privacy concerns of performing an evaluation and management service by telephone and the availability of in person appointments. I also discussed with the patient that there may be a patient responsible charge related to this service. The patient expressed understanding and agreed to proceed, consent obtained  Chief complaint:  Chief Complaint  Patient presents with   Gastroesophageal Reflux    Pt in need of Protonix refill    Hyperlipidemia    Pt in need of refill crestor, no break through pain, no concerns    Medication Refill    also in need of refill spironolactone today     History of Present Illness: Maria Sweeney is a 70 y.o. female  GERD, hypertension, chest discomfort Discussed in February.  Occasional chest symptoms at that time, some breakthrough reflux symptoms at night.  Breakthrough heartburn few times per week.  Some atypical chest tightness/chest symptoms when discussed at that time and is somewhat exertional, sometimes thought to be more reflux component.  No apparent acute findings or changes on EKG.  Referred to cardiology, and gastroenterology given breakthrough symptoms on chronic daily PPI. Saw cardiology February 9.  Had improvement in symptoms, was going to the gym 3-4 times per week at that visit.  Had not tolerated metoprolol, started on hydralazine 25 mg 3 times daily if blood pressure over 180, continued on spironolactone. No further chest pains. Still exercising, gardening, mowing. Cataract surgery next week.  Currently taking spirinolactone 75mg  qd. Only took hydralazine once.  Home blood pressures:130/70-140/80. Rare 150.  No missed  doses meds.  Still having breakthrough heartburn, sometimes to point of vomiting.  Taking protonix QD - with meds in am.   Lab Results  Component Value Date   CREATININE 0.81 06/13/2020   BP Readings from Last 3 Encounters:  06/19/20 134/80  06/13/20 139/71  04/12/20 (!) 152/82   Hyperlipidemia: Crestor 10mg  qd. No new myalgias.  Lab Results  Component Value Date   CHOL 187 06/13/2020   HDL 51 06/13/2020   LDLCALC 98 06/13/2020   TRIG 227 (H) 06/13/2020   CHOLHDL 3.7 06/13/2020   Lab Results  Component Value Date   ALT 47 (H) 06/13/2020   AST 35 06/13/2020   ALKPHOS 115 06/13/2020   BILITOT 0.7 06/13/2020    Prediabetes: Prediabetes by history, A1c in diabetic range in February.  Plan for continued diet approach with low intensity exercise, repeat testing in 3 months. No home readings.  Some blurry vision - cataract procedure.  Some increased thirst at times lately, some increased urination - longstanding.  Lab Results  Component Value Date   HGBA1C 6.7 (H) 06/13/2020   Wt Readings from Last 3 Encounters:  10/17/20 144 lb (65.3 kg)  06/19/20 147 lb (66.7 kg)  06/13/20 147 lb (66.7 kg)   Depression: Taking prozac 60mg  qd. Same dose for awhile.  Works well most of the time. Sometimes feels anxious, nervous feeling once in awhile - not often. Funny feeling in head when anxious, no SI,HI.  No alcohol.  No recent counseling.  No new stressors.   Does not want to take additional medication.   GAD 7 : Generalized Anxiety Score 10/17/2020 12/07/2019 04/01/2016  04/26/2015  Nervous, Anxious, on Edge 1 1 3 1   Control/stop worrying 0 1 3 2   Worry too much - different things 0 1 3 3   Trouble relaxing 1 1 3 3   Restless 1 1 3  0  Easily annoyed or irritable 0 0 2 0  Afraid - awful might happen 0 0 2 1  Total GAD 7 Score 3 5 19 10   Anxiety Difficulty - - Not difficult at all Not difficult at all      Depression screen [facility] 2/9 10/17/2020 06/13/2020 04/12/2020 12/07/2019  10/26/2019  Decreased Interest 1 0 0 1 0  Down, Depressed, Hopeless 0 0 0 1 0  PHQ - 2 Score 1 0 0 2 0  Altered sleeping 0 - - 1 -  Tired, decreased energy 1 - - 1 -  Change in appetite 0 - - 0 -  Feeling bad or failure about yourself  0 - - 0 -  Trouble concentrating 0 - - 1 -  Moving slowly or fidgety/restless 0 - - 0 -  Suicidal thoughts 0 - - 0 -  PHQ-9 Score 2 - - 5 -  Difficult doing work/chores - - - - -  Some recent data might be hidden      Patient Active Problem List   Diagnosis Date Noted   Encounter for screening colonoscopy    Vitamin D deficiency 08/02/2018   Prediabetes 08/02/2018   Carpal tunnel syndrome 02/10/2018   Tinnitus 06/25/2016   Numbness 04/13/2016   Loss of smell 04/13/2016   Pure hypercholesterolemia 11/30/2014   Degenerative disc disease, cervical 04/24/2014   Essential hypertension, benign 10/19/2013   Insomnia 10/19/2013   Herpes labialis 10/19/2013   Anxiety and depression 07/25/2012   GERD (gastroesophageal reflux disease) 07/25/2012   Past Medical History:  Diagnosis Date   Anxiety    Arthritis    Phreesia 04/12/2020   Asthma    Cataract    Phreesia 10/25/2019   Cervical spondylosis    Complication of anesthesia    with colonoscopy woke up twice   Depression    Depression    Phreesia 04/12/2020   GERD (gastroesophageal reflux disease)    Glaucoma    Phreesia 10/25/2019   Hyperlipidemia    Hypertension    IBS (irritable bowel syndrome)    Constipation with diarrhea.   Oral herpes    Past Surgical History:  Procedure Laterality Date   ABDOMINAL HYSTERECTOMY  1978   DUB; ovaries intact.   APPENDECTOMY  1988   BREAST BIOPSY Bilateral 2009   Calcium Deposit- 1980's and also in 2009/ Right side 1995   BREAST EXCISIONAL BIOPSY Bilateral    BREAST SURGERY N/A    Phreesia 10/25/2019   COLONOSCOPY  05/11/2009   Normal.  Previous colonoscopy with polyps. [facility].     COLONOSCOPY WITH PROPOFOL N/A 12/20/2019   Procedure:  COLONOSCOPY WITH PROPOFOL;  Surgeon: Lin Landsman, MD;  Location: [facility] ENDOSCOPY;  Service: Gastroenterology;  Laterality: N/A;   DEBRIDEMENT TENNIS ELBOW     ROTATOR CUFF REPAIR     TONSILLECTOMY     Allergies  Allergen Reactions   Amoxicillin Anaphylaxis   Augmentin [Amoxicillin-Pot Clavulanate]    Codeine Itching   Depakote [Divalproex Sodium]    Latex Hives   Lipitor [Atorvastatin]    Penicillins    Zocor [Simvastatin]    Septra [Sulfamethoxazole-Trimethoprim] Rash   Prior to Admission medications   Medication Sig Start Date End Date Taking? Authorizing Provider  albuterol (PROVENTIL  HFA;VENTOLIN HFA) 108 (90 Base) MCG/ACT inhaler Inhale 2 puffs into the lungs every 4 (four) hours as needed for wheezing or shortness of breath (cough, shortness of breath or wheezing.). 02/11/16  Yes Wardell Honour, MD  dorzolamide-timolol (COSOPT) 22.3-6.8 MG/ML ophthalmic solution  12/25/19  Yes [provider]  FLUoxetine (PROZAC) 20 MG capsule TAKE 3 CAPSULES BY MOUTH DAILY 08/30/20  Yes Wendie Agreste, MD  hydrALAZINE (APRESOLINE) 25 MG tablet Take 1 tablet (25 mg total) by mouth 3 (three) times daily as needed. 06/19/20  Yes Gollan, Kathlene November, MD  latanoprost (XALATAN) 0.005 % ophthalmic solution 1 drop at bedtime.   Yes [provider]  mirtazapine (REMERON) 7.5 MG tablet Take 1 tablet (7.5 mg total) by mouth at bedtime as needed. 12/07/19  Yes Jacelyn Pi, Lilia Argue, MD  Omega-3 Fatty Acids (FISH OIL OMEGA-3) 1000 MG CAPS Take by mouth.   Yes [provider]  pantoprazole (PROTONIX) 40 MG tablet TAKE 1 TABLET BY MOUTH ONCE A DAY 10/10/19  Yes Jacelyn Pi, Irma M, MD  rosuvastatin (CRESTOR) 10 MG tablet Take 1 tablet (10 mg total) by mouth daily. 12/07/19  Yes Jacelyn Pi, Lilia Argue, MD  spironolactone (ALDACTONE) 50 MG tablet TAKE 1 TABLET BY MOUTH ONCE A DAY 10/14/20  Yes Wendie Agreste, MD  VITAMIN D PO Take by mouth.   Yes [provider]   Social  History   Socioeconomic History   Marital status: Divorced    Spouse name: Not on file   Number of children: 2   Years of education: Bachelors   Highest education level: Not on file  Occupational History   Occupation: Press photographer ASSOCIATE    Employer: [employer]    Comment: also works at [employer]  Tobacco Use   Smoking status: Never   Smokeless tobacco: Never  Vaping Use   Vaping Use: Never used  Substance and Sexual Activity   Alcohol use: No   Drug use: Never   Sexual activity: Yes    Partners: Male    Birth control/protection: Surgical, Post-menopausal  Other Topics Concern   Not on file  Social History Narrative   Marital status: divorced since 1999; dating seriously in 2019 since 2015.      Children: one son, one daughter; 2 grandchildren.      Lives: alone in house with 2 dogs     Employment:  Retired.  Needs to work in 2019.      Tobacco: none      Alcohol: rare      Drugs: none      Exercise:  Sporadic   Right-handed.   Occasional caffeine use.      ADLs: independent with ADLs; drives; no assistant devices.      Advanced Directives: none; FULL CODE.   Social Determinants of Health   Financial Resource Strain: Not on file  Food Insecurity: Not on file  Transportation Needs: Not on file  Physical Activity: Not on file  Stress: Not on file  Social Connections: Not on file  Intimate Partner Violence: Not on file    Observations/Objective: BP 155/81 today.  Nontoxic appearance on video, no respiratory distress, speaking full sentences.  No focal weakness appreciated.  Euthymic mood, good eye contact, appropriate responses.  All questions were answered with understanding of plan expressed.  Assessment and Plan: Prediabetes - Plan: Hemoglobin A1c  -Repeat A1c.  Had increased to diabetes range previously.  Does report some increased thirst, frequent urination but may  be longstanding.  Planned cataract procedure next week, check labs tomorrow.  Anxiety and depression  - Plan: FLUoxetine (PROZAC) 20 MG capsule  -Variable control, option of med changes but declined at this time.  Handout given on managing anxiety.  Follow-up if not improving.  Continue exercise.  Also consider counseling  Essential hypertension, benign   -Borderline/elevated on home readings.  Verify accuracy of meter tomorrow nurse visit.  Continue spironolactone with option of hydralazine for spike in blood pressure.  Pure hypercholesterolemia - Plan: rosuvastatin (CRESTOR) 10 MG tablet, Comprehensive metabolic panel  -Tolerating Crestor, check CMP was prior borderline elevated LFT.  Gastroesophageal reflux disease, unspecified whether esophagitis present - Plan: pantoprazole (PROTONIX) 40 MG tablet  -Uncontrolled, add Pepcid, follow-up with gastroenterology as previously recommended, unfortunately has not been able to make that appointment.  Phone number provided.  Continue Protonix daily for now as well.   Follow Up Instructions: 3 months in office, but nurse visit tomorrow.   I discussed the assessment and treatment plan with the patient. The patient was provided an opportunity to ask questions and all were answered. The patient agreed with the plan and demonstrated an understanding of the instructions.   The patient was advised to call back or seek an in-person evaluation if the symptoms worsen or if the condition fails to improve as anticipated.  I provided  31 minutes of non-face-to-face time during this encounter.   Wendie Agreste, MD

## 2020-10-18 ENCOUNTER — Other Ambulatory Visit: Payer: Self-pay

## 2020-10-18 ENCOUNTER — Ambulatory Visit (INDEPENDENT_AMBULATORY_CARE_PROVIDER_SITE_OTHER): Payer: PPO

## 2020-10-18 DIAGNOSIS — R7303 Prediabetes: Secondary | ICD-10-CM | POA: Diagnosis not present

## 2020-10-18 DIAGNOSIS — E7800 Pure hypercholesterolemia, unspecified: Secondary | ICD-10-CM

## 2020-10-18 LAB — COMPREHENSIVE METABOLIC PANEL
ALT: 35 U/L (ref 0–35)
AST: 26 U/L (ref 0–37)
Albumin: 4.7 g/dL (ref 3.5–5.2)
Alkaline Phosphatase: 95 U/L (ref 39–117)
BUN: 12 mg/dL (ref 6–23)
CO2: 25 mEq/L (ref 19–32)
Calcium: 9.7 mg/dL (ref 8.4–10.5)
Chloride: 106 mEq/L (ref 96–112)
Creatinine, Ser: 0.82 mg/dL (ref 0.40–1.20)
GFR: 72.34 mL/min (ref >60.00)
Glucose, Bld: 107 mg/dL — ABNORMAL HIGH (ref 70–99)
Potassium: 4.1 mEq/L (ref 3.5–5.1)
Sodium: 142 mEq/L (ref 135–145)
Total Bilirubin: 0.8 mg/dL (ref 0.2–1.2)
Total Protein: 7 g/dL (ref 6.0–8.3)

## 2020-10-18 LAB — HEMOGLOBIN A1C: Hgb A1c MFr Bld: 6.4 % (ref 4.6–6.5)

## 2020-10-23 ENCOUNTER — Ambulatory Visit: Payer: PPO | Admitting: Anesthesiology

## 2020-10-23 ENCOUNTER — Ambulatory Visit
Admission: RE | Admit: 2020-10-23 | Discharge: 2020-10-23 | Disposition: A | Discharge status: Home / Self Care | Payer: PPO | Attending: Ophthalmology | Admitting: Ophthalmology

## 2020-10-23 ENCOUNTER — Other Ambulatory Visit: Payer: Self-pay

## 2020-10-23 ENCOUNTER — Encounter
Admission: RE | Disposition: A | Discharge status: Home / Self Care | Payer: Self-pay | Source: Home / Self Care | Attending: Ophthalmology

## 2020-10-23 ENCOUNTER — Encounter: Payer: Self-pay | Admitting: Ophthalmology

## 2020-10-23 DIAGNOSIS — Z881 Allergy status to other antibiotic agents status: Secondary | ICD-10-CM | POA: Diagnosis not present

## 2020-10-23 DIAGNOSIS — Z8349 Family history of other endocrine, nutritional and metabolic diseases: Secondary | ICD-10-CM | POA: Diagnosis not present

## 2020-10-23 DIAGNOSIS — Z833 Family history of diabetes mellitus: Secondary | ICD-10-CM | POA: Insufficient documentation

## 2020-10-23 DIAGNOSIS — Z803 Family history of malignant neoplasm of breast: Secondary | ICD-10-CM | POA: Insufficient documentation

## 2020-10-23 DIAGNOSIS — J45909 Unspecified asthma, uncomplicated: Secondary | ICD-10-CM | POA: Diagnosis not present

## 2020-10-23 DIAGNOSIS — Z882 Allergy status to sulfonamides status: Secondary | ICD-10-CM | POA: Diagnosis not present

## 2020-10-23 DIAGNOSIS — Z885 Allergy status to narcotic agent status: Secondary | ICD-10-CM | POA: Diagnosis not present

## 2020-10-23 DIAGNOSIS — Z83511 Family history of glaucoma: Secondary | ICD-10-CM | POA: Insufficient documentation

## 2020-10-23 DIAGNOSIS — H25812 Combined forms of age-related cataract, left eye: Secondary | ICD-10-CM | POA: Diagnosis not present

## 2020-10-23 DIAGNOSIS — Z8249 Family history of ischemic heart disease and other diseases of the circulatory system: Secondary | ICD-10-CM | POA: Diagnosis not present

## 2020-10-23 DIAGNOSIS — H401122 Primary open-angle glaucoma, left eye, moderate stage: Secondary | ICD-10-CM | POA: Insufficient documentation

## 2020-10-23 DIAGNOSIS — Z880 Allergy status to penicillin: Secondary | ICD-10-CM | POA: Diagnosis not present

## 2020-10-23 DIAGNOSIS — Z801 Family history of malignant neoplasm of trachea, bronchus and lung: Secondary | ICD-10-CM | POA: Diagnosis not present

## 2020-10-23 DIAGNOSIS — Z825 Family history of asthma and other chronic lower respiratory diseases: Secondary | ICD-10-CM | POA: Diagnosis not present

## 2020-10-23 DIAGNOSIS — H2512 Age-related nuclear cataract, left eye: Secondary | ICD-10-CM | POA: Diagnosis not present

## 2020-10-23 DIAGNOSIS — Z91040 Latex allergy status: Secondary | ICD-10-CM | POA: Diagnosis not present

## 2020-10-23 DIAGNOSIS — Z79899 Other long term (current) drug therapy: Secondary | ICD-10-CM | POA: Insufficient documentation

## 2020-10-23 HISTORY — PX: CATARACT EXTRACTION W/PHACO: SHX586

## 2020-10-23 SURGERY — PHACOEMULSIFICATION, CATARACT, WITH IOL INSERTION
Anesthesia: General | Site: Eye | Laterality: Left

## 2020-10-23 MED ORDER — TRYPAN BLUE 0.06 % OP SOLN
OPHTHALMIC | Status: DC | PRN
  Administered 2020-10-23: 0.5 mL via INTRAOCULAR

## 2020-10-23 MED ORDER — MIDAZOLAM HCL 2 MG/2ML IJ SOLN
INTRAMUSCULAR | Status: DC | PRN
  Administered 2020-10-23: 2 mg via INTRAVENOUS

## 2020-10-23 MED ORDER — LIDOCAINE HCL (PF) 2 % IJ SOLN
INTRAOCULAR | Status: DC | PRN
  Administered 2020-10-23: 2 mL

## 2020-10-23 MED ORDER — ACETAMINOPHEN 160 MG/5ML PO SOLN: 325.0000 mg | ORAL | Status: DC | PRN

## 2020-10-23 MED ORDER — NA CHONDROIT SULF-NA HYALURON 40-30 MG/ML IO SOSY
INTRAOCULAR | Status: DC | PRN
  Administered 2020-10-23: 0.5 mL via INTRAOCULAR

## 2020-10-23 MED ORDER — NA HYALUR & NA CHOND-NA HYALUR 0.4-0.35 ML IO KIT
PACK | INTRAOCULAR | Status: DC | PRN
  Administered 2020-10-23: 1 mL via INTRAOCULAR

## 2020-10-23 MED ORDER — ACETAMINOPHEN 325 MG PO TABS: 325.0000 mg | ORAL_TABLET | ORAL | Status: DC | PRN

## 2020-10-23 MED ORDER — EPINEPHRINE PF 1 MG/ML IJ SOLN
INTRAOCULAR | Status: DC | PRN
  Administered 2020-10-23: 55 mL via OPHTHALMIC

## 2020-10-23 MED ORDER — ARMC OPHTHALMIC DILATING DROPS
1.0000 "application " | OPHTHALMIC | Status: DC | PRN
  Administered 2020-10-23 (×3): 1 via OPHTHALMIC

## 2020-10-23 MED ORDER — FENTANYL CITRATE (PF) 100 MCG/2ML IJ SOLN
INTRAMUSCULAR | Status: DC | PRN
  Administered 2020-10-23: 50 ug via INTRAVENOUS

## 2020-10-23 MED ORDER — TETRACAINE HCL 0.5 % OP SOLN
1.0000 [drp] | OPHTHALMIC | Status: DC | PRN
  Administered 2020-10-23 (×3): 1 [drp] via OPHTHALMIC

## 2020-10-23 MED ORDER — MOXIFLOXACIN HCL 0.5 % OP SOLN
OPHTHALMIC | Status: DC | PRN
  Administered 2020-10-23: 0.2 mL via OPHTHALMIC

## 2020-10-23 MED ORDER — LACTATED RINGERS IV SOLN: INTRAVENOUS | Status: DC

## 2020-10-23 SURGICAL SUPPLY — 20 items
BLADE DUAL KAHOOK SINGLE USE (BLADE) ×3 IMPLANT
CANNULA ANT/CHMB 27GA (MISCELLANEOUS) ×3 IMPLANT
GLOVE ORTHO TXT STRL SZ7.5 (GLOVE) ×3 IMPLANT
GOWN STRL REUS W/ TWL LRG LVL3 (GOWN DISPOSABLE) ×2 IMPLANT
GOWN STRL REUS W/TWL LRG LVL3 (GOWN DISPOSABLE) ×6
ICLIP (OPHTHALMIC RELATED) ×3 IMPLANT
LENS IOL EYHANCE TORIC II 27.5 ×3 IMPLANT
LENS IOL EYHANCE TRC 600 27.5 ×1 IMPLANT
MARKER SKIN DUAL TIP RULER LAB (MISCELLANEOUS) ×3 IMPLANT
NEEDLE CAPSULORHEX 25GA (NEEDLE) ×3 IMPLANT
NEEDLE FILTER BLUNT 18X 1/2SAF (NEEDLE) ×4
NEEDLE FILTER BLUNT 18X1 1/2 (NEEDLE) ×2 IMPLANT
PACK CATARACT BRASINGTON (MISCELLANEOUS) ×3 IMPLANT
PACK EYE AFTER SURG (MISCELLANEOUS) ×3 IMPLANT
PACK OPTHALMIC (MISCELLANEOUS) ×3 IMPLANT
SOLUTION OPHTHALMIC SALT (MISCELLANEOUS) ×3 IMPLANT
SYR 3ML LL SCALE MARK (SYRINGE) ×6 IMPLANT
SYR TB 1ML LUER SLIP (SYRINGE) ×3 IMPLANT
WATER STERILE IRR 250ML POUR (IV SOLUTION) ×3 IMPLANT
WIPE NON LINTING 3.25X3.25 (MISCELLANEOUS) ×3 IMPLANT

## 2020-10-23 NOTE — H&P (Signed)
[facility]   Primary Care Physician:  Wendie Agreste, MD Ophthalmologist: Dr. Leandrew Koyanagi  Pre-Procedure History & Physical: HPI:  Maria Sweeney is a 70 y.o. female here for ophthalmic surgery.   Past Medical History:  Diagnosis Date   Anxiety    Arthritis    Phreesia 04/12/2020   Asthma    Cataract    Phreesia 10/25/2019   Cervical spondylosis    Complication of anesthesia    with colonoscopy woke up twice   Depression    Depression    Phreesia 04/12/2020   GERD (gastroesophageal reflux disease)    Glaucoma    Phreesia 10/25/2019   Hyperlipidemia    Hypertension    IBS (irritable bowel syndrome)    Constipation with diarrhea.   Oral herpes     Past Surgical History:  Procedure Laterality Date   ABDOMINAL HYSTERECTOMY  1978   DUB; ovaries intact.   APPENDECTOMY  1988   BREAST BIOPSY Bilateral 2009   Calcium Deposit- 1980's and also in 2009/ Right side 1995   BREAST EXCISIONAL BIOPSY Bilateral    BREAST SURGERY N/A    Phreesia 10/25/2019   COLONOSCOPY  05/11/2009   Normal.  Previous colonoscopy with polyps. [facility].     COLONOSCOPY WITH PROPOFOL N/A 12/20/2019   Procedure: COLONOSCOPY WITH PROPOFOL;  Surgeon: Lin Landsman, MD;  Location: [facility] ENDOSCOPY;  Service: Gastroenterology;  Laterality: N/A;   DEBRIDEMENT TENNIS ELBOW     ROTATOR CUFF REPAIR     TONSILLECTOMY      Prior to Admission medications   Medication Sig Start Date End Date Taking? Authorizing Provider  albuterol (PROVENTIL HFA;VENTOLIN HFA) 108 (90 Base) MCG/ACT inhaler Inhale 2 puffs into the lungs every 4 (four) hours as needed for wheezing or shortness of breath (cough, shortness of breath or wheezing.). 02/11/16  Yes Wardell Honour, MD  dorzolamide-timolol (COSOPT) 22.3-6.8 MG/ML ophthalmic solution  12/25/19  Yes [provider]  FLUoxetine (PROZAC) 20 MG capsule Take 3 capsules (60 mg total) by mouth daily. 10/17/20  Yes Wendie Agreste, MD  hydrALAZINE  (APRESOLINE) 25 MG tablet Take 1 tablet (25 mg total) by mouth 3 (three) times daily as needed. 06/19/20  Yes Gollan, Kathlene November, MD  latanoprost (XALATAN) 0.005 % ophthalmic solution 1 drop at bedtime.   Yes [provider]  mirtazapine (REMERON) 7.5 MG tablet Take 1 tablet (7.5 mg total) by mouth at bedtime as needed. 12/07/19  Yes Jacelyn Pi, Lilia Argue, MD  Omega-3 Fatty Acids (FISH OIL OMEGA-3) 1000 MG CAPS Take by mouth.   Yes [provider]  pantoprazole (PROTONIX) 40 MG tablet Take 1 tablet (40 mg total) by mouth daily. 10/17/20  Yes Wendie Agreste, MD  rosuvastatin (CRESTOR) 10 MG tablet Take 1 tablet (10 mg total) by mouth daily. 10/17/20  Yes Wendie Agreste, MD  spironolactone (ALDACTONE) 50 MG tablet TAKE 1 TABLET BY MOUTH ONCE A DAY 10/14/20  Yes Wendie Agreste, MD  VITAMIN D PO Take by mouth.    [provider]    Allergies as of 08/27/2020 - Review Complete 06/19/2020  Allergen Reaction Noted   Amoxicillin  12/24/2011   Augmentin [amoxicillin-pot clavulanate]  12/24/2011   Codeine  12/24/2011   Depakote [divalproex sodium]  12/24/2011   Latex  12/24/2011   Lipitor [atorvastatin]  12/24/2011   Penicillins  12/24/2011   Septra [sulfamethoxazole-trimethoprim]  12/24/2011   Zocor [simvastatin]  12/24/2011    Family History  Problem  Relation Age of Onset   Hyperlipidemia Mother    Hypertension Mother    Cancer Mother        lung   Diabetes Mother    Glaucoma Mother    Stroke Mother 44   Heart disease Mother    Asthma Father    Emphysema Father    COPD Father    Glaucoma Father    Hypertension Sister    Breast cancer Paternal Grandmother     Social History   Socioeconomic History   Marital status: Divorced    Spouse name: Not on file   Number of children: 2   Years of education: Bachelors   Highest education level: Not on file  Occupational History   Occupation: Press photographer ASSOCIATE    Employer: [employer]    Comment: also works at [employer]  Tobacco Use   Smoking status: Never   Smokeless tobacco: Never  Vaping Use   Vaping Use: Never used  Substance and Sexual Activity   Alcohol use: No   Drug use: Never   Sexual activity: Yes    Partners: Male    Birth control/protection: Surgical, Post-menopausal  Other Topics Concern   Not on file  Social History Narrative   Marital status: divorced since 1999; dating seriously in 2019 since 2015.      Children: one son, one daughter; 2 grandchildren.      Lives: alone in house with 2 dogs     Employment:  Retired.  Needs to work in 2019.      Tobacco: none      Alcohol: rare      Drugs: none      Exercise:  Sporadic   Right-handed.   Occasional caffeine use.      ADLs: independent with ADLs; drives; no assistant devices.      Advanced Directives: none; FULL CODE.   Social Determinants of Health   Financial Resource Strain: Not on file  Food Insecurity: Not on file  Transportation Needs: Not on file  Physical Activity: Not on file  Stress: Not on file  Social Connections: Not on file  Intimate Partner Violence: Not on file    Review of Systems: See HPI, otherwise negative ROS  Physical Exam: BP (!) 148/74   Pulse 66   Temp (!) 97.5 F (36.4 C) (Temporal)   Ht 5' (1.524 m)   Wt 64.4 kg   SpO2 99%   BMI 27.73 kg/m  General:   Alert,  pleasant and cooperative in NAD Head:  Normocephalic and atraumatic. Lungs:  Clear to auscultation.    Heart:  Regular rate and rhythm.   Impression/Plan: Maria Sweeney is here for ophthalmic surgery.  Risks, benefits, limitations, and alternatives regarding ophthalmic surgery have been reviewed with the patient.  Questions have been answered.  All parties agreeable.   Leandrew Koyanagi, MD  10/23/2020, 11:14 AM

## 2020-10-23 NOTE — Op Note (Signed)
  PREOPERATIVE DIAGNOSIS:  Nuclear sclerotic cataract left eye. H25.12  moderate stage Primary Open Angle Glaucoma left eye H40.1122  POSTOPERATIVE DIAGNOSIS:    Nuclear sclerotic cataract left eye.     moderate stage Primary Open Angle Glaucoma left eye H40.1122  PROCEDURE:  Phacoemusification with posterior chamber TORIC intraocular lens placement of the left eye  [place] Dual Blade goniotomy left eye  Ultrasound time: Procedure(s): CATARACT EXTRACTION PHACO AND INTRAOCULAR LENS PLACEMENT (IOC) LEFT TORIC LENS [place] DUAL BLADE GONIOTOMY,  VISION BLUE 7.69 00:53.0 (Left)  LENS:  Implant Name Type Inv. Item Serial No. Manufacturer Lot No. LRB No. Used Action  LENS IOL EYHANCE TORIC II 27.5 - [id number]  LENS IOL EYHANCE TORIC II 27.5 [id number] [id number]   Left 1 Implanted   [id number] 27.5 D toric eyhance with 6 D of cylindrical correction at 172 degree axis.  SURGEON:  Wyonia Hough, MD   ANESTHESIA:  Topical with tetracaine drops augmented with 1% preservative-free intracameral lidocaine.    COMPLICATIONS:  None.   DESCRIPTION OF PROCEDURE:  The patient was identified in the holding room and transported to the operating room and placed in the supine position under the operating microscope.  The left eye was identified as the operative eye and it was prepped and draped in the usual sterile ophthalmic fashion.   A 1 millimeter clear-corneal paracentesis was made at the 5:30 position.  0.5 ml of preservative-free 1% lidocaine was injected into the anterior chamber. Vision blue was used to stain the trabecular meshwork.  The anterior chamber was filled with Viscoat viscoelastic.  A 2.4 millimeter keratome was used to make a near-clear corneal incision at the 2:30 position. The microscope was adjusted and a gonioprism was used to visulaize the trabecular meshwork.  The [employer] Dual Blade was advanced across the anterior chamber under viscoelastic.  The blade was used to mark the  trabecular meshwork at the 7:30 position.  The blade was placed two clock hours clockwise into the meshwork.  Proper postioning was confirmed.  The blade ws passed counterclockwise through the meshwork to excise approximately two to three clock-hours of trabecular meshwork.   A curvilinear capsulorrhexis was made with a cystotome and capsulorrhexis forceps.  Balanced salt solution was used to hydrodissect and hydrodelineate the nucleus.   Phacoemulsification was then used in stop and chop fashion to remove the lens nucleus and epinucleus.  The remaining cortex was then removed using the irrigation and aspiration handpiece. Provisc was then placed into the capsular bag to distend it for lens placement.  A Toric lens was then injected into the capsular bag and oriented to a position of 172 degrees.  The remaining viscoelastic was aspirated.   Wounds were hydrated with balanced salt solution.  The anterior chamber was inflated to a physiologic pressure with balanced salt solution.  No wound leaks were noted. Vigamox 0.2 ml of a 1mg  per ml solution was injected into the anterior chamber for a dose of 0.2 mg of intracameral antibiotic at the completion of the case.  The patient was taken to the recovery room in stable condition without complications of anesthesia or surgery.

## 2020-10-23 NOTE — Anesthesia Postprocedure Evaluation (Signed)
Anesthesia Post Note  Patient: Maria Sweeney  Procedure(s) Performed: CATARACT EXTRACTION PHACO AND INTRAOCULAR LENS PLACEMENT (IOC) LEFT TORIC LENS [place] DUAL BLADE GONIOTOMY,  VISION BLUE 7.69 00:53.0 (Left: Eye)     Patient location during evaluation: PACU Anesthesia Type: General Level of consciousness: awake and alert Pain management: pain level controlled Vital Signs Assessment: post-procedure vital signs reviewed and stable Respiratory status: spontaneous breathing, nonlabored ventilation, respiratory function stable and patient connected to nasal cannula oxygen Cardiovascular status: stable and blood pressure returned to baseline Postop Assessment: no apparent nausea or vomiting Anesthetic complications: no   No notable events documented.  Trecia Rogers

## 2020-10-23 NOTE — Anesthesia Preprocedure Evaluation (Signed)
Anesthesia Evaluation  Patient identified by MRN, date of birth, ID band Patient awake    Reviewed: Allergy & Precautions, H&P , NPO status , Patient's Chart, lab work & pertinent test results, reviewed documented beta blocker date and time   Airway Mallampati: II  TM Distance: >3 FB Neck ROM: full    Dental no notable dental hx.    Pulmonary asthma ,    Pulmonary exam normal breath sounds clear to auscultation       Cardiovascular Exercise Tolerance: Good hypertension, Normal cardiovascular exam Rhythm:regular Rate:Normal     Neuro/Psych PSYCHIATRIC DISORDERS Anxiety Depression negative neurological ROS     GI/Hepatic Neg liver ROS, GERD  ,  Endo/Other  negative endocrine ROS  Renal/GU negative Renal ROS  negative genitourinary   Musculoskeletal   Abdominal   Peds  Hematology negative hematology ROS (+)   Anesthesia Other Findings   Reproductive/Obstetrics negative OB ROS                             Anesthesia Physical Anesthesia Plan  ASA: 2  Anesthesia Plan: General   Post-op Pain Management:    Induction:   PONV Risk Score and Plan:   Airway Management Planned:   Additional Equipment:   Intra-op Plan:   Post-operative Plan:   Informed Consent: I have reviewed the patients History and Physical, chart, labs and discussed the procedure including the risks, benefits and alternatives for the proposed anesthesia with the patient or authorized representative who has indicated his/her understanding and acceptance.     Dental Advisory Given  Plan Discussed with: CRNA and Anesthesiologist  Anesthesia Plan Comments:         Anesthesia Quick Evaluation

## 2020-10-23 NOTE — Transfer of Care (Signed)
Immediate Anesthesia Transfer of Care Note  Patient: Maria Sweeney  Procedure(s) Performed: CATARACT EXTRACTION PHACO AND INTRAOCULAR LENS PLACEMENT (IOC) LEFT TORIC LENS [place] DUAL BLADE GONIOTOMY,  VISION BLUE 7.69 00:53.0 (Left: Eye)  Patient Location: PACU  Anesthesia Type: General  Level of Consciousness: awake, alert  and patient cooperative  Airway and Oxygen Therapy: Patient Spontanous Breathing and Patient connected to supplemental oxygen  Post-op Assessment: Post-op Vital signs reviewed, Patient's Cardiovascular Status Stable, Respiratory Function Stable, Patent Airway and No signs of Nausea or vomiting  Post-op Vital Signs: Reviewed and stable  Complications: No notable events documented.

## 2020-10-24 ENCOUNTER — Encounter: Payer: Self-pay | Admitting: Ophthalmology

## 2020-10-29 DIAGNOSIS — H2511 Age-related nuclear cataract, right eye: Secondary | ICD-10-CM | POA: Diagnosis not present

## 2020-11-13 ENCOUNTER — Encounter: Payer: Self-pay | Admitting: Ophthalmology

## 2020-11-20 ENCOUNTER — Encounter
Admission: RE | Disposition: A | Discharge status: Home / Self Care | Payer: Self-pay | Source: Home / Self Care | Attending: Ophthalmology

## 2020-11-20 ENCOUNTER — Encounter: Payer: Self-pay | Admitting: Ophthalmology

## 2020-11-20 ENCOUNTER — Ambulatory Visit: Payer: PPO | Admitting: Anesthesiology

## 2020-11-20 ENCOUNTER — Ambulatory Visit
Admission: RE | Admit: 2020-11-20 | Discharge: 2020-11-20 | Disposition: A | Discharge status: Home / Self Care | Payer: PPO | Attending: Ophthalmology | Admitting: Ophthalmology

## 2020-11-20 ENCOUNTER — Other Ambulatory Visit: Payer: Self-pay

## 2020-11-20 DIAGNOSIS — H2511 Age-related nuclear cataract, right eye: Secondary | ICD-10-CM | POA: Insufficient documentation

## 2020-11-20 DIAGNOSIS — Z885 Allergy status to narcotic agent status: Secondary | ICD-10-CM | POA: Insufficient documentation

## 2020-11-20 DIAGNOSIS — Z880 Allergy status to penicillin: Secondary | ICD-10-CM | POA: Insufficient documentation

## 2020-11-20 DIAGNOSIS — H25811 Combined forms of age-related cataract, right eye: Secondary | ICD-10-CM | POA: Diagnosis not present

## 2020-11-20 DIAGNOSIS — Z79899 Other long term (current) drug therapy: Secondary | ICD-10-CM | POA: Insufficient documentation

## 2020-11-20 DIAGNOSIS — H401112 Primary open-angle glaucoma, right eye, moderate stage: Secondary | ICD-10-CM | POA: Diagnosis not present

## 2020-11-20 DIAGNOSIS — Z91040 Latex allergy status: Secondary | ICD-10-CM | POA: Diagnosis not present

## 2020-11-20 DIAGNOSIS — Z888 Allergy status to other drugs, medicaments and biological substances status: Secondary | ICD-10-CM | POA: Insufficient documentation

## 2020-11-20 DIAGNOSIS — Z881 Allergy status to other antibiotic agents status: Secondary | ICD-10-CM | POA: Diagnosis not present

## 2020-11-20 HISTORY — PX: CATARACT EXTRACTION W/PHACO: SHX586

## 2020-11-20 SURGERY — PHACOEMULSIFICATION, CATARACT, WITH IOL INSERTION
Anesthesia: Monitor Anesthesia Care | Site: Eye | Laterality: Right

## 2020-11-20 MED ORDER — ACETAMINOPHEN 160 MG/5ML PO SOLN: 325.0000 mg | ORAL | Status: DC | PRN

## 2020-11-20 MED ORDER — ONDANSETRON HCL 4 MG/2ML IJ SOLN: 4.0000 mg | Freq: Once | INTRAMUSCULAR | Status: DC | PRN

## 2020-11-20 MED ORDER — FENTANYL CITRATE (PF) 100 MCG/2ML IJ SOLN
INTRAMUSCULAR | Status: DC | PRN
  Administered 2020-11-20 (×2): 50 ug via INTRAVENOUS

## 2020-11-20 MED ORDER — LACTATED RINGERS IV SOLN: INTRAVENOUS | Status: DC

## 2020-11-20 MED ORDER — TRYPAN BLUE 0.06 % OP SOLN: OPHTHALMIC | Status: DC | PRN

## 2020-11-20 MED ORDER — SIGHTPATH DOSE#1 NA HYALUR & NA CHOND-NA HYALUR IO KIT
PACK | INTRAOCULAR | Status: DC | PRN
  Administered 2020-11-20: 1 via OPHTHALMIC

## 2020-11-20 MED ORDER — ACETAMINOPHEN 325 MG PO TABS: 650.0000 mg | ORAL_TABLET | Freq: Once | ORAL | Status: DC | PRN

## 2020-11-20 MED ORDER — LIDOCAINE HCL (PF) 2 % IJ SOLN
INTRAOCULAR | Status: DC | PRN
  Administered 2020-11-20: 1 mL

## 2020-11-20 MED ORDER — BRIMONIDINE TARTRATE-TIMOLOL 0.2-0.5 % OP SOLN: OPHTHALMIC | Status: DC | PRN

## 2020-11-20 MED ORDER — TETRACAINE HCL 0.5 % OP SOLN
1.0000 [drp] | OPHTHALMIC | Status: DC | PRN
  Administered 2020-11-20 (×3): 1 [drp] via OPHTHALMIC

## 2020-11-20 MED ORDER — MIDAZOLAM HCL 2 MG/2ML IJ SOLN
INTRAMUSCULAR | Status: DC | PRN
  Administered 2020-11-20: 2 mg via INTRAVENOUS

## 2020-11-20 MED ORDER — SIGHTPATH DOSE#1 BSS IO SOLN
INTRAOCULAR | Status: DC | PRN
  Administered 2020-11-20: 82 mL via OPHTHALMIC

## 2020-11-20 MED ORDER — MOXIFLOXACIN HCL 0.5 % OP SOLN
OPHTHALMIC | Status: DC | PRN
  Administered 2020-11-20: 0.2 mL via OPHTHALMIC

## 2020-11-20 MED ORDER — CYCLOPENTOLATE HCL 2 % OP SOLN
1.0000 [drp] | OPHTHALMIC | Status: DC | PRN
  Administered 2020-11-20 (×3): 1 [drp] via OPHTHALMIC

## 2020-11-20 MED ORDER — PHENYLEPHRINE HCL 10 % OP SOLN
1.0000 [drp] | OPHTHALMIC | Status: DC | PRN
  Administered 2020-11-20 (×3): 1 [drp] via OPHTHALMIC

## 2020-11-20 SURGICAL SUPPLY — 17 items
BLADE DUAL KAHOOK SINGLE USE (BLADE) ×2 IMPLANT
CANNULA ANT/CHMB 27GA (MISCELLANEOUS) ×2 IMPLANT
GLOVE SURG ENC TEXT LTX SZ7.5 (GLOVE) ×2 IMPLANT
GOWN STRL REUS W/ TWL LRG LVL3 (GOWN DISPOSABLE) ×2 IMPLANT
GOWN STRL REUS W/TWL LRG LVL3 (GOWN DISPOSABLE) ×4
ICLIP (OPHTHALMIC RELATED) ×2 IMPLANT
KNIFE SIDECUT EYE (MISCELLANEOUS) ×2 IMPLANT
LENS IOL TECNIS EYHANCE 26.0 (Intraocular Lens) ×2 IMPLANT
MARKER SKIN DUAL TIP RULER LAB (MISCELLANEOUS) ×2 IMPLANT
NEEDLE CAPSULORHEX 25GA (NEEDLE) ×2 IMPLANT
NEEDLE FILTER BLUNT 18X 1/2SAF (NEEDLE) ×2
NEEDLE FILTER BLUNT 18X1 1/2 (NEEDLE) ×2 IMPLANT
PACK EYE AFTER SURG (MISCELLANEOUS) ×2 IMPLANT
SYR 3ML LL SCALE MARK (SYRINGE) ×4 IMPLANT
SYR TB 1ML LUER SLIP (SYRINGE) ×2 IMPLANT
WATER STERILE IRR 250ML POUR (IV SOLUTION) ×2 IMPLANT
WIPE NON LINTING 3.25X3.25 (MISCELLANEOUS) ×2 IMPLANT

## 2020-11-20 NOTE — H&P (Signed)
[facility]   Primary Care Physician:  Wendie Agreste, MD Ophthalmologist: Dr. Leandrew Koyanagi  Pre-Procedure History & Physical: HPI:  Maria Sweeney is a 70 y.o. female here for ophthalmic surgery.   Past Medical History:  Diagnosis Date   Anxiety    Arthritis    Phreesia 04/12/2020   Asthma    Cataract    Phreesia 10/25/2019   Cervical spondylosis    Complication of anesthesia    with colonoscopy woke up twice   Depression    Depression    Phreesia 04/12/2020   GERD (gastroesophageal reflux disease)    Glaucoma    Phreesia 10/25/2019   Hyperlipidemia    Hypertension    IBS (irritable bowel syndrome)    Constipation with diarrhea.   Oral herpes     Past Surgical History:  Procedure Laterality Date   ABDOMINAL HYSTERECTOMY  1978   DUB; ovaries intact.   APPENDECTOMY  1988   BREAST BIOPSY Bilateral 2009   Calcium Deposit- 1980's and also in 2009/ Right side 1995   BREAST EXCISIONAL BIOPSY Bilateral    BREAST SURGERY N/A    Phreesia 10/25/2019   CATARACT EXTRACTION W/PHACO Left 10/23/2020   Procedure: CATARACT EXTRACTION PHACO AND INTRAOCULAR LENS PLACEMENT (IOC) LEFT TORIC LENS [place] DUAL BLADE GONIOTOMY,  VISION BLUE 7.69 00:53.0;  Surgeon: Leandrew Koyanagi, MD;  Location: [place];  Service: Ophthalmology;  Laterality: Left;   COLONOSCOPY  05/11/2009   Normal.  Previous colonoscopy with polyps. [place].     COLONOSCOPY WITH PROPOFOL N/A 12/20/2019   Procedure: COLONOSCOPY WITH PROPOFOL;  Surgeon: Lin Landsman, MD;  Location: [facility] ENDOSCOPY;  Service: Gastroenterology;  Laterality: N/A;   DEBRIDEMENT TENNIS ELBOW     ROTATOR CUFF REPAIR     TONSILLECTOMY      Prior to Admission medications   Medication Sig Start Date End Date Taking? Authorizing Provider  albuterol (PROVENTIL HFA;VENTOLIN HFA) 108 (90 Base) MCG/ACT inhaler Inhale 2 puffs into the lungs every 4 (four) hours as needed for wheezing or shortness of breath (cough,  shortness of breath or wheezing.). 02/11/16  Yes Wardell Honour, MD  dorzolamide-timolol (COSOPT) 22.3-6.8 MG/ML ophthalmic solution  12/25/19  Yes [provider]  FLUoxetine (PROZAC) 20 MG capsule Take 3 capsules (60 mg total) by mouth daily. 10/17/20  Yes Wendie Agreste, MD  hydrALAZINE (APRESOLINE) 25 MG tablet Take 1 tablet (25 mg total) by mouth 3 (three) times daily as needed. 06/19/20  Yes Gollan, Kathlene November, MD  latanoprost (XALATAN) 0.005 % ophthalmic solution 1 drop at bedtime.   Yes [provider]  mirtazapine (REMERON) 7.5 MG tablet Take 1 tablet (7.5 mg total) by mouth at bedtime as needed. 12/07/19  Yes Jacelyn Pi, Lilia Argue, MD  Omega-3 Fatty Acids (FISH OIL OMEGA-3) 1000 MG CAPS Take by mouth.   Yes [provider]  pantoprazole (PROTONIX) 40 MG tablet Take 1 tablet (40 mg total) by mouth daily. 10/17/20  Yes Wendie Agreste, MD  rosuvastatin (CRESTOR) 10 MG tablet Take 1 tablet (10 mg total) by mouth daily. 10/17/20  Yes Wendie Agreste, MD  spironolactone (ALDACTONE) 50 MG tablet TAKE 1 TABLET BY MOUTH ONCE A DAY 10/14/20  Yes Wendie Agreste, MD  valACYclovir (VALTREX) 1000 MG tablet Take 1,000 mg by mouth 2 (two) times daily as needed.   Yes [provider]  VITAMIN D PO Take by mouth.   Yes [provider]    Allergies as  of 08/27/2020 - Review Complete 06/19/2020  Allergen Reaction Noted   Amoxicillin  12/24/2011   Augmentin [amoxicillin-pot clavulanate]  12/24/2011   Codeine  12/24/2011   Depakote [divalproex sodium]  12/24/2011   Latex  12/24/2011   Lipitor [atorvastatin]  12/24/2011   Penicillins  12/24/2011   Septra [sulfamethoxazole-trimethoprim]  12/24/2011   Zocor [simvastatin]  12/24/2011    Family History  Problem Relation Age of Onset   Hyperlipidemia Mother    Hypertension Mother    Cancer Mother        lung   Diabetes Mother    Glaucoma Mother    Stroke Mother 81   Heart disease Mother    Asthma Father     Emphysema Father    COPD Father    Glaucoma Father    Hypertension Sister    Breast cancer Paternal Grandmother     Social History   Socioeconomic History   Marital status: Divorced    Spouse name: Not on file   Number of children: 2   Years of education: Bachelors   Highest education level: Not on file  Occupational History   Occupation: Press photographer ASSOCIATE    Employer: [employer]    Comment: also works at [employer]  Tobacco Use   Smoking status: Never   Smokeless tobacco: Never  Vaping Use   Vaping Use: Never used  Substance and Sexual Activity   Alcohol use: No   Drug use: Never   Sexual activity: Yes    Partners: Male    Birth control/protection: Surgical, Post-menopausal  Other Topics Concern   Not on file  Social History Narrative   Marital status: divorced since 1999; dating seriously in 2019 since 2015.      Children: one son, one daughter; 2 grandchildren.      Lives: alone in house with 2 dogs     Employment:  Retired.  Needs to work in 2019.      Tobacco: none      Alcohol: rare      Drugs: none      Exercise:  Sporadic   Right-handed.   Occasional caffeine use.      ADLs: independent with ADLs; drives; no assistant devices.      Advanced Directives: none; FULL CODE.   Social Determinants of Health   Financial Resource Strain: Not on file  Food Insecurity: Not on file  Transportation Needs: Not on file  Physical Activity: Not on file  Stress: Not on file  Social Connections: Not on file  Intimate Partner Violence: Not on file    Review of Systems: See HPI, otherwise negative ROS  Physical Exam: BP (!) 164/74   Pulse 63   Temp (!) 97 F (36.1 C)   Ht 5' (1.524 m)   Wt 64 kg   SpO2 97%   BMI 27.54 kg/m  General:   Alert,  pleasant and cooperative in NAD Head:  Normocephalic and atraumatic. Lungs:  Clear to auscultation.    Heart:  Regular rate and rhythm.   Impression/Plan: Maria Sweeney is here for ophthalmic surgery.  Risks,  benefits, limitations, and alternatives regarding ophthalmic surgery have been reviewed with the patient.  Questions have been answered.  All parties agreeable.   Leandrew Koyanagi, MD  11/20/2020, 12:04 PM

## 2020-11-20 NOTE — Anesthesia Preprocedure Evaluation (Addendum)
Anesthesia Evaluation  Patient identified by MRN, date of birth, ID band Patient awake    Reviewed: Allergy & Precautions, NPO status , Patient's Chart, lab work & pertinent test results  History of Anesthesia Complications Negative for: history of anesthetic complications  Airway Mallampati: IV   Neck ROM: Full    Dental  (+)    Pulmonary asthma ,    Pulmonary exam normal breath sounds clear to auscultation       Cardiovascular hypertension, Normal cardiovascular exam Rhythm:Regular Rate:Normal     Neuro/Psych PSYCHIATRIC DISORDERS Anxiety Depression negative neurological ROS     GI/Hepatic GERD  ,  Endo/Other  Prediabetes   Renal/GU negative Renal ROS     Musculoskeletal  (+) Arthritis ,   Abdominal   Peds  Hematology negative hematology ROS (+)   Anesthesia Other Findings   Reproductive/Obstetrics                            Anesthesia Physical Anesthesia Plan  ASA: 2  Anesthesia Plan: MAC   Post-op Pain Management:    Induction: Intravenous  PONV Risk Score and Plan: 2 and TIVA, Midazolam and Treatment may vary due to age or medical condition  Airway Management Planned: Nasal Cannula  Additional Equipment:   Intra-op Plan:   Post-operative Plan:   Informed Consent: I have reviewed the patients History and Physical, chart, labs and discussed the procedure including the risks, benefits and alternatives for the proposed anesthesia with the patient or authorized representative who has indicated his/her understanding and acceptance.       Plan Discussed with: CRNA  Anesthesia Plan Comments:        Anesthesia Quick Evaluation

## 2020-11-20 NOTE — Anesthesia Procedure Notes (Signed)
Procedure Name: MAC Date/Time: 11/20/2020 1:07 PM Performed by: Jeannene Patella, CRNA Pre-anesthesia Checklist: Patient identified, Emergency Drugs available, Suction available, Timeout performed and Patient being monitored Patient Re-evaluated:Patient Re-evaluated prior to induction Oxygen Delivery Method: Nasal cannula Placement Confirmation: positive ETCO2

## 2020-11-20 NOTE — Anesthesia Postprocedure Evaluation (Signed)
Anesthesia Post Note  Patient: Maria Sweeney  Procedure(s) Performed: CATARACT EXTRACTION PHACO AND INTRAOCULAR LENS PLACEMENT (IOC) RIGHT LENS [place] DUAL BLADE GONIOTOMY VISION BLUE 8.58 01:24.9 (Right: Eye)     Patient location during evaluation: PACU Anesthesia Type: MAC Level of consciousness: awake and alert, oriented and patient cooperative Pain management: pain level controlled Vital Signs Assessment: post-procedure vital signs reviewed and stable Respiratory status: spontaneous breathing, nonlabored ventilation and respiratory function stable Cardiovascular status: blood pressure returned to baseline and stable Postop Assessment: adequate PO intake Anesthetic complications: no   No notable events documented.  Darrin Nipper

## 2020-11-20 NOTE — Transfer of Care (Signed)
Immediate Anesthesia Transfer of Care Note  Patient: Maria Sweeney  Procedure(s) Performed: CATARACT EXTRACTION PHACO AND INTRAOCULAR LENS PLACEMENT (IOC) RIGHT LENS [place] DUAL BLADE GONIOTOMY VISION BLUE 8.58 01:24.9 (Right: Eye)  Patient Location: PACU  Anesthesia Type: MAC  Level of Consciousness: awake, alert  and patient cooperative  Airway and Oxygen Therapy: Patient Spontanous Breathing and Patient connected to supplemental oxygen  Post-op Assessment: Post-op Vital signs reviewed, Patient's Cardiovascular Status Stable, Respiratory Function Stable, Patent Airway and No signs of Nausea or vomiting  Post-op Vital Signs: Reviewed and stable  Complications: No notable events documented.

## 2020-11-20 NOTE — Op Note (Signed)
  PREOPERATIVE DIAGNOSIS:  Nuclear sclerotic cataract  right eye. H25.11  moderate stage Primary Open Angle Glaucoma right eye H40.1112  POSTOPERATIVE DIAGNOSIS:    Nuclear sclerotic cataract right eye.     moderate  stage Primary Open Angle Glaucoma right eye H40.1112  PROCEDURE:  Phacoemusification with posterior chamber intraocular lens placement of the right eye  [place] Dual Blade goniotomy right eye  Ultrasound time: Procedure(s) with comments: CATARACT EXTRACTION PHACO AND INTRAOCULAR LENS PLACEMENT (IOC) RIGHT LENS [place] DUAL BLADE GONIOTOMY VISION BLUE 8.58 01:24.9 (Right) - latex LENS:  Implant Name Type Inv. Item Serial No. Manufacturer Lot No. LRB No. Used Action  LENS IOL TECNIS EYHANCE 26.0 - [id number] Intraocular Lens LENS IOL TECNIS EYHANCE 26.0 [id number] JOHNSON   Right 1 Implanted    SURGEON:  Wyonia Hough, MD   ANESTHESIA:  Topical with tetracaine drops augmented with 1% preservative-free intracameral lidocaine.    COMPLICATIONS:  None.   DESCRIPTION OF PROCEDURE:  The patient was identified in the holding room and transported to the operating room and placed in the supine position under the operating microscope.  The right eye was identified as the operative eye and it was prepped and draped in the usual sterile ophthalmic fashion.   A 1 millimeter clear-corneal paracentesis was made at the 12:00 position.  0.5 ml of preservative-free 1% lidocaine was injected into the anterior chamber.  The anterior chamber was filled with Viscoat viscoelastic.  A 2.4 millimeter keratome was used to make a near-clear corneal incision at the 9:00 position. The microscope was adjusted and a gonioprism was used to visulaize the trabecular meshwork.  The [facility] Dual Blade was advanced across the anterior chamber under viscoelastic.  The blade was used to mark the trabecular meshwork at the 1:30 position.  The blade was placed two clock hours clockwise into the meshwork.  Proper  postioning was confirmed.  The blade ws passed counterclockwise through the meshwork to excise approximately two to three clock-hours of trabecular meshwork.   A curvilinear capsulorrhexis was made with a cystotome and capsulorrhexis forceps.  Balanced salt solution was used to hydrodissect and hydrodelineate the nucleus.   Phacoemulsification was then used in stop and chop fashion to remove the lens nucleus and epinucleus.  The remaining cortex was then removed using the irrigation and aspiration handpiece. Provisc was then placed into the capsular bag to distend it for lens placement.  A lens was then injected into the capsular bag.  The remaining viscoelastic was aspirated.   Wounds were hydrated with balanced salt solution.  The anterior chamber was inflated to a physiologic pressure with balanced salt solution.  No wound leaks were noted. Vigamox 0.2 ml of a 1mg  per ml solution was injected into the anterior chamber for a dose of 0.2 mg of intracameral antibiotic at the completion of the case.  The patient was taken to the recovery room in stable condition without complications of anesthesia or surgery.

## 2020-11-22 ENCOUNTER — Encounter: Payer: Self-pay | Admitting: Ophthalmology

## 2020-12-23 DIAGNOSIS — H43813 Vitreous degeneration, bilateral: Secondary | ICD-10-CM | POA: Diagnosis not present

## 2020-12-30 DIAGNOSIS — H16041 Marginal corneal ulcer, right eye: Secondary | ICD-10-CM | POA: Diagnosis not present

## 2021-01-18 ENCOUNTER — Other Ambulatory Visit: Payer: Self-pay | Admitting: Family Medicine

## 2021-01-23 DIAGNOSIS — H43813 Vitreous degeneration, bilateral: Secondary | ICD-10-CM | POA: Diagnosis not present

## 2021-03-27 ENCOUNTER — Other Ambulatory Visit: Payer: Self-pay | Admitting: Family Medicine

## 2021-04-02 ENCOUNTER — Ambulatory Visit: Payer: PPO | Admitting: Family Medicine

## 2021-04-08 ENCOUNTER — Telehealth: Payer: Self-pay

## 2021-04-08 ENCOUNTER — Encounter: Payer: Self-pay | Admitting: Registered Nurse

## 2021-04-08 ENCOUNTER — Other Ambulatory Visit: Payer: Self-pay

## 2021-04-08 ENCOUNTER — Ambulatory Visit (INDEPENDENT_AMBULATORY_CARE_PROVIDER_SITE_OTHER): Payer: PPO | Admitting: Registered Nurse

## 2021-04-08 VITALS — BP 124/79 | HR 75 | Temp 98.2°F | Resp 18 | Ht 60.0 in | Wt 145.6 lb

## 2021-04-08 DIAGNOSIS — N644 Mastodynia: Secondary | ICD-10-CM | POA: Diagnosis not present

## 2021-04-08 DIAGNOSIS — K219 Gastro-esophageal reflux disease without esophagitis: Secondary | ICD-10-CM | POA: Diagnosis not present

## 2021-04-08 MED ORDER — SUCRALFATE 1 G PO TABS: 1.0000 g | ORAL_TABLET | Freq: Three times a day (TID) | ORAL | 0 refills | Status: DC

## 2021-04-08 MED ORDER — FAMOTIDINE 40 MG PO TABS: 40.0000 mg | ORAL_TABLET | Freq: Every day | ORAL | 0 refills | Status: DC

## 2021-04-08 NOTE — Progress Notes (Signed)
Established Patient Office Visit  Subjective:  Patient ID: Maria Sweeney, female    DOB: May 08, 1951  Age: 70 y.o. MRN: [id number]  CC:  Chief Complaint  Patient presents with   Referral    Patient states she would like to get some referrals. Patient states she has been having pain in her left breast a month ago.     HPI Maria Sweeney presents for L breast pain  Onset 1 mo ago - intermittent. Fullness under arm and into L breast  Tenderness in ULQ of breast. No distinct masses. No skin changes. No nipple changes.  Chart review shows mammography BI-RADS 1 in Dec 2021.   GERD Treatment resistant  On pantoprazole. Limited effect. Occ bilious vomiting with "sour burps" Worse at night, does not eat for 4 hours before bed Occ constipation and diarrhea but has been fairly stable lately.  Past Medical History:  Diagnosis Date   Anxiety    Arthritis    Phreesia 04/12/2020   Asthma    Cataract    Phreesia 10/25/2019   Cervical spondylosis    Complication of anesthesia    with colonoscopy woke up twice   Depression    Depression    Phreesia 04/12/2020   GERD (gastroesophageal reflux disease)    Glaucoma    Phreesia 10/25/2019   Hyperlipidemia    Hypertension    IBS (irritable bowel syndrome)    Constipation with diarrhea.   Oral herpes     Past Surgical History:  Procedure Laterality Date   ABDOMINAL HYSTERECTOMY  1978   DUB; ovaries intact.   APPENDECTOMY  1988   BREAST BIOPSY Bilateral 2009   Calcium Deposit- 1980's and also in 2009/ Right side 1995   BREAST EXCISIONAL BIOPSY Bilateral    BREAST SURGERY N/A    Phreesia 10/25/2019   CATARACT EXTRACTION W/PHACO Left 10/23/2020   Procedure: CATARACT EXTRACTION PHACO AND INTRAOCULAR LENS PLACEMENT (IOC) LEFT TORIC LENS [place] DUAL BLADE GONIOTOMY,  VISION BLUE 7.69 00:53.0;  Surgeon: Leandrew Koyanagi, MD;  Location: [place];  Service: Ophthalmology;  Laterality: Left;   CATARACT EXTRACTION W/PHACO  Right 11/20/2020   Procedure: CATARACT EXTRACTION PHACO AND INTRAOCULAR LENS PLACEMENT ([place]) RIGHT LENS [place] DUAL BLADE GONIOTOMY VISION BLUE 8.58 01:24.9;  Surgeon: Leandrew Koyanagi, MD;  Location: [place];  Service: Ophthalmology;  Laterality: Right;  latex   COLONOSCOPY  05/11/2009   Normal.  Previous colonoscopy with polyps. [place].     COLONOSCOPY WITH PROPOFOL N/A 12/20/2019   Procedure: COLONOSCOPY WITH PROPOFOL;  Surgeon: Lin Landsman, MD;  Location: [facility] ENDOSCOPY;  Service: Gastroenterology;  Laterality: N/A;   DEBRIDEMENT TENNIS ELBOW     ROTATOR CUFF REPAIR     TONSILLECTOMY      Family History  Problem Relation Age of Onset   Hyperlipidemia Mother    Hypertension Mother    Cancer Mother        lung   Diabetes Mother    Glaucoma Mother    Stroke Mother 73   Heart disease Mother    Asthma Father    Emphysema Father    COPD Father    Glaucoma Father    Hypertension Sister    Breast cancer Paternal Grandmother     Social History   Socioeconomic History   Marital status: Divorced    Spouse name: Not on file   Number of children: 2   Years of education: Bachelors   Highest education level: Not on file  Occupational History   Occupation: Chemical engineer: [employer]    Comment: also works at [employer]  Tobacco Use   Smoking status: Never   Smokeless tobacco: Never  Vaping Use   Vaping Use: Never used  Substance and Sexual Activity   Alcohol use: No   Drug use: Never   Sexual activity: Yes    Partners: Male    Birth control/protection: Surgical, Post-menopausal  Other Topics Concern   Not on file  Social History Narrative   Marital status: divorced since 1999; dating seriously in 2019 since 2015.      Children: one son, one daughter; 2 grandchildren.      Lives: alone in house with 2 dogs     Employment:  Retired.  Needs to work in 2019.      Tobacco: none      Alcohol: rare      Drugs: none      Exercise:  Sporadic    Right-handed.   Occasional caffeine use.      ADLs: independent with ADLs; drives; no assistant devices.      Advanced Directives: none; FULL CODE.   Social Determinants of Health   Financial Resource Strain: Not on file  Food Insecurity: Not on file  Transportation Needs: Not on file  Physical Activity: Not on file  Stress: Not on file  Social Connections: Not on file  Intimate Partner Violence: Not on file    Outpatient Medications Prior to Visit  Medication Sig Dispense Refill   albuterol (PROVENTIL HFA;VENTOLIN HFA) 108 (90 Base) MCG/ACT inhaler Inhale 2 puffs into the lungs every 4 (four) hours as needed for wheezing or shortness of breath (cough, shortness of breath or wheezing.). 1 Inhaler 1   dorzolamide-timolol (COSOPT) 22.3-6.8 MG/ML ophthalmic solution      FLUoxetine (PROZAC) 20 MG capsule Take 3 capsules (60 mg total) by mouth daily. 180 capsule 1   hydrALAZINE (APRESOLINE) 25 MG tablet Take 1 tablet (25 mg total) by mouth 3 (three) times daily as needed. 90 tablet 1   latanoprost (XALATAN) 0.005 % ophthalmic solution 1 drop at bedtime.     mirtazapine (REMERON) 7.5 MG tablet Take 1 tablet (7.5 mg total) by mouth at bedtime as needed. 30 tablet 2   Omega-3 Fatty Acids (FISH OIL OMEGA-3) 1000 MG CAPS Take by mouth.     pantoprazole (PROTONIX) 40 MG tablet Take 1 tablet (40 mg total) by mouth daily. 90 tablet 3   rosuvastatin (CRESTOR) 10 MG tablet Take 1 tablet (10 mg total) by mouth daily. 90 tablet 1   spironolactone (ALDACTONE) 50 MG tablet TAKE 1 TABLET BY MOUTH ONCE A DAY 90 tablet 1   valACYclovir (VALTREX) 1000 MG tablet Take 1,000 mg by mouth 2 (two) times daily as needed.     VITAMIN D PO Take by mouth.     No facility-administered medications prior to visit.    Allergies  Allergen Reactions   Amoxicillin Anaphylaxis   Augmentin [Amoxicillin-Pot Clavulanate]    Codeine Itching   Depakote [Divalproex Sodium]    Latex Hives   Lipitor [Atorvastatin]     Penicillins    Zocor [Simvastatin]    Septra [Sulfamethoxazole-Trimethoprim] Rash    ROS Review of Systems  Constitutional: Negative.   HENT: Negative.    Eyes: Negative.   Respiratory: Negative.    Cardiovascular: Negative.   Gastrointestinal: Negative.   Genitourinary: Negative.   Musculoskeletal: Negative.   Skin: Negative.   Neurological: Negative.  Psychiatric/Behavioral: Negative.    All other systems reviewed and are negative.    Objective:    Physical Exam Vitals and nursing note reviewed.  Constitutional:      General: She is not in acute distress.    Appearance: Normal appearance. She is normal weight. She is not ill-appearing, toxic-appearing or diaphoretic.  Cardiovascular:     Rate and Rhythm: Normal rate and regular rhythm.     Heart sounds: Normal heart sounds. No murmur heard.   No friction rub. No gallop.  Pulmonary:     Effort: Pulmonary effort is normal. No respiratory distress.     Breath sounds: Normal breath sounds. No stridor. No wheezing, rhonchi or rales.  Chest:     Chest wall: No tenderness.  Skin:    General: Skin is warm and dry.  Neurological:     General: No focal deficit present.     Mental Status: She is alert and oriented to person, place, and time. Mental status is at baseline.  Psychiatric:        Mood and Affect: Mood normal.        Behavior: Behavior normal.        Thought Content: Thought content normal.        Judgment: Judgment normal.    BP 124/79   Pulse 75   Temp 98.2 F (36.8 C) (Temporal)   Resp 18   Ht 5' (1.524 m)   Wt 145 lb 9.6 oz (66 kg)   SpO2 99%   BMI 28.44 kg/m  Wt Readings from Last 3 Encounters:  04/08/21 145 lb 9.6 oz (66 kg)  11/20/20 141 lb (64 kg)  10/23/20 142 lb (64.4 kg)     Health Maintenance Due  Topic Date Due   TETANUS/TDAP  Never done   Zoster Vaccines- Shingrix (1 of 2) Never done   COVID-19 Vaccine (3 - Booster for Pfizer series) 10/17/2019    There are no preventive care  reminders to display for this patient.  Lab Results  Component Value Date   TSH 2.250 12/07/2019   Lab Results  Component Value Date   WBC 9.9 03/07/2020   HGB 13.5 03/07/2020   HCT 40.0 03/07/2020   MCV 94.1 03/07/2020   PLT 246 03/07/2020   Lab Results  Component Value Date   NA 142 10/18/2020   K 4.1 10/18/2020   CO2 25 10/18/2020   GLUCOSE 107 (H) 10/18/2020   BUN 12 10/18/2020   CREATININE 0.82 10/18/2020   BILITOT 0.8 10/18/2020   ALKPHOS 95 10/18/2020   AST 26 10/18/2020   ALT 35 10/18/2020   PROT 7.0 10/18/2020   ALBUMIN 4.7 10/18/2020   CALCIUM 9.7 10/18/2020   ANIONGAP 9 03/07/2020   GFR 72.34 10/18/2020   Lab Results  Component Value Date   CHOL 187 06/13/2020   Lab Results  Component Value Date   HDL 51 06/13/2020   Lab Results  Component Value Date   LDLCALC 98 06/13/2020   Lab Results  Component Value Date   TRIG 227 (H) 06/13/2020   Lab Results  Component Value Date   CHOLHDL 3.7 06/13/2020   Lab Results  Component Value Date   HGBA1C 6.4 10/18/2020      Assessment & Plan:   Problem List Items Addressed This Visit       Digestive   GERD (gastroesophageal reflux disease)   Relevant Medications   famotidine (PEPCID) 40 MG tablet   sucralfate (CARAFATE) 1 g tablet  Other Visit Diagnoses     Breast tenderness    -  Primary   Relevant Orders   MM Digital Diagnostic Bilat   US BREAST COMPLETE UNI LEFT INC AXILLA       Meds ordered this encounter  Medications   famotidine (PEPCID) 40 MG tablet    Sig: Take 1 tablet (40 mg total) by mouth daily.    Dispense:  90 tablet    Refill:  0    Order Specific Question:   Supervising Provider    Answer:   Carlota Raspberry, JEFFREY R [[id number]]   sucralfate (CARAFATE) 1 g tablet    Sig: Take 1 tablet (1 g total) by mouth 4 (four) times daily -  with meals and at bedtime.    Dispense:  40 tablet    Refill:  0    Order Specific Question:   Supervising Provider    Answer:   Carlota Raspberry, JEFFREY R  [[id number]]    Follow-up: Return if symptoms worsen or fail to improve.   PLAN Diagnostic mammography and [place] ordered for breast pain. Suggest stretching for pectoral and trapezius muscle in case of underlying msk issue. Suggest follow up with ortho if imaging negative. Sucralfate and famotidine for GERD. Reviewed lifestyle management of GERD, with which she is largely compliant. Patient encouraged to call clinic with any questions, comments, or concerns.  Maximiano Coss, NP

## 2021-04-08 NOTE — Telephone Encounter (Signed)
Ok to switch, may be more expensive. If prohibitive pt should let us know  Thanks  Rich

## 2021-04-08 NOTE — Patient Instructions (Addendum)
[name] to see you  In brief:  Breast pain - ultrasound and diagnostic mammogram ordered to [facility] imaging. They will call you to schedule, or you can give them a buzz at [phone] GERD - take famotidine daily and sucralfate four times daily for ten days. If no relief, contact GI at [phone]. Let us know if we need to replace this referral, but it appears open at this time. I commend you on your positive lifestyle changes. Keep up the great work!  Thank you  Rich     If you have lab work done today you will be contacted with your lab results within the next 2 weeks.  If you have not heard from [place] then please contact us. The fastest way to get your results is to register for My Chart.   IF you received an x-ray today, you will receive an invoice from [facility]iology. Please contact [facility] Radiology at [phone] with questions or concerns regarding your invoice.   IF you received labwork today, you will receive an invoice from [place]. Please contact [facility] at [phone] with questions or concerns regarding your invoice.   Our billing staff will not be able to assist you with questions regarding bills from these companies.  You will be contacted with the lab results as soon as they are available. The fastest way to get your results is to activate your My Chart account. Instructions are located on the last page of this paperwork. If you have not heard from [place] regarding the results in 2 weeks, please contact this office.

## 2021-04-08 NOTE — Telephone Encounter (Signed)
Caller name:[facility]   On DPR? :Yes  Call back number:[phone]  Provider they see: Maximiano Coss  Reason for call:sucralfate (CARAFATE) 1 g tablet  if this can be switched to a liquid the tablet are on back order?

## 2021-04-09 NOTE — Telephone Encounter (Signed)
Patient called back and stated that price of medication is not feasible for her and would like an alternative.

## 2021-04-09 NOTE — Telephone Encounter (Signed)
Lvm for patient letting her know the copay would be $100 per pharmacy. Waiting to confirm whether that's feasible for her or if she would like an alternative

## 2021-04-11 ENCOUNTER — Other Ambulatory Visit: Payer: Self-pay | Admitting: Family Medicine

## 2021-04-11 DIAGNOSIS — F32A Depression, unspecified: Secondary | ICD-10-CM

## 2021-04-11 DIAGNOSIS — F419 Anxiety disorder, unspecified: Secondary | ICD-10-CM

## 2021-04-21 ENCOUNTER — Telehealth: Payer: Self-pay | Admitting: Family Medicine

## 2021-04-21 NOTE — Chronic Care Management (AMB) (Signed)
  Chronic Care Management   Outreach Note  04/21/2021 Name: CAELI LINEHAN MRN: [id number] DOB: [birth year]  Referred by: Wendie Agreste, MD Reason for referral : No chief complaint on file.   An unsuccessful telephone outreach was attempted today. The patient was referred to the pharmacist for assistance with care management and care coordination.   Follow Up Plan:   Tatjana Dellinger Upstream Scheduler

## 2021-04-25 ENCOUNTER — Telehealth: Payer: Self-pay | Admitting: Family Medicine

## 2021-04-25 NOTE — Chronic Care Management (AMB) (Signed)
°  Chronic Care Management   Note  04/25/2021 Name: Maria Sweeney MRN: [id number] DOB: [birth year]  Maria Sweeney is a 70 y.o. year old female who is a primary care patient of Carlota Raspberry Ranell Patrick, MD. I reached out to [name] by phone today in response to a referral sent by [name] PCP, Wendie Agreste, MD.   [name] including:  CCM service includes personalized support from designated clinical staff supervised by her physician, including individualized plan of care and coordination with other care providers 24/7 contact phone numbers for assistance for urgent and routine care needs. Service will only be billed when office clinical staff spend 20 minutes or more in a month to coordinate care. Only one practitioner may furnish and bill the service in a calendar month. The patient may stop CCM services at any time (effective at the end of the month) by phone call to the office staff.   Patient agreed to services and verbal consent obtained.   Follow up plan:   Tatjana Secretary/administrator

## 2021-04-28 NOTE — Progress Notes (Deleted)
Chronic Care Management Pharmacy Note  04/28/2021 Name:  RIO TABER MRN:  [id number] DOB:  [birth year]  Summary: ***  Recommendations/Changes made from today's visit: ***  Plan: ***   Subjective: Maria Sweeney is an 70 y.o. year old female who is a primary patient of Wendie Agreste, MD.  The CCM team was consulted for assistance with disease management and care coordination needs.    {CCMTELEPHONEFACETOFACE:21091510} for {CCMINITIALFOLLOWUPCHOICE:21091511} in response to provider referral for pharmacy case management and/or care coordination services.   Consent to Services:  {CCMCONSENTOPTIONS:25074}  Patient Care Team: Wendie Agreste, MD as PCP - General (Family Medicine) Edythe Clarity, [facility] as Pharmacist (Pharmacist)  Recent office visits: ***  Recent consult visits: [facility] visits: {Hospital DC Yes/No:25215}   Objective:  Lab Results  Component Value Date   CREATININE 0.82 10/18/2020   BUN 12 10/18/2020   GFR 72.34 10/18/2020   GFRNONAA 74 06/13/2020   GFRAA 85 06/13/2020   NA 142 10/18/2020   K 4.1 10/18/2020   CALCIUM 9.7 10/18/2020   CO2 25 10/18/2020   GLUCOSE 107 (H) 10/18/2020    Lab Results  Component Value Date/Time   HGBA1C 6.4 10/18/2020 12:07 PM   HGBA1C 6.7 (H) 06/13/2020 03:30 PM   GFR 72.34 10/18/2020 12:07 PM    Last diabetic Eye exam: No results found for: HMDIABEYEEXA  Last diabetic Foot exam: No results found for: HMDIABFOOTEX   Lab Results  Component Value Date   CHOL 187 06/13/2020   HDL 51 06/13/2020   LDLCALC 98 06/13/2020   TRIG 227 (H) 06/13/2020   CHOLHDL 3.7 06/13/2020    Hepatic Function Latest Ref Rng & Units 10/18/2020 06/13/2020 03/07/2020  Total Protein 6.0 - 8.3 g/dL 7.0 7.2 7.4  Albumin 3.5 - 5.2 g/dL 4.7 4.7 4.2  AST 0 - 37 U/L 26 35 23  ALT 0 - 35 U/L 35 47(H) 28  Alk Phosphatase 39 - 117 U/L 95 115 100  Total Bilirubin 0.2 - 1.2 mg/dL 0.8 0.7 1.1    Lab Results  Component Value  Date/Time   TSH 2.250 12/07/2019 11:18 AM   TSH 3.000 02/10/2019 11:47 AM    CBC Latest Ref Rng & Units 03/07/2020 02/10/2019 06/18/2017  WBC 4.0 - 10.5 K/uL 9.9 9.6 9.3  Hemoglobin 12.0 - 15.0 g/dL 13.5 14.7 14.7  Hematocrit 36.0 - 46.0 % 40.0 42.8 43.6  Platelets 150 - 400 K/uL 246 297 292    Lab Results  Component Value Date/Time   VD25OH 42.9 06/13/2020 03:30 PM   VD25OH 26.6 (L) 12/07/2019 11:18 AM    Clinical ASCVD: {YES/NO:21197} The 10-year ASCVD risk score (Arnett DK, et al., 2019) is: 13.1%   Values used to calculate the score:     Age: 89 years     Sex: Female     Is Non-Hispanic African American: No     Diabetic: No     Tobacco smoker: No     Systolic Blood Pressure: 673 mmHg     Is BP treated: Yes     HDL Cholesterol: 51 mg/dL     Total Cholesterol: 187 mg/dL    Depression screen [facility] 2/9 10/17/2020 06/13/2020 04/12/2020  Decreased Interest 1 0 0  Down, Depressed, Hopeless 0 0 0  PHQ - 2 Score 1 0 0  Altered sleeping 0 - -  Tired, decreased energy 1 - -  Change in appetite 0 - -  Feeling bad or failure about yourself  0 - -  Trouble concentrating 0 - -  Moving slowly or fidgety/restless 0 - -  Suicidal thoughts 0 - -  PHQ-9 Score 2 - -  Difficult doing work/chores - - -  Some recent data might be hidden     ***Other: (CHADS2VASc if Afib, MMRC or CAT for COPD, ACT, DEXA)  Social History   Tobacco Use  Smoking Status Never  Smokeless Tobacco Never   BP Readings from Last 3 Encounters:  04/08/21 124/79  11/20/20 (!) 144/79  10/23/20 136/72   Pulse Readings from Last 3 Encounters:  04/08/21 75  11/20/20 (!) 58  10/23/20 64   Wt Readings from Last 3 Encounters:  04/08/21 145 lb 9.6 oz (66 kg)  11/20/20 141 lb (64 kg)  10/23/20 142 lb (64.4 kg)   BMI Readings from Last 3 Encounters:  04/08/21 28.44 kg/m  11/20/20 27.54 kg/m  10/23/20 27.73 kg/m    Assessment/Interventions: Review of patient past medical history, allergies, medications,  health status, including review of consultants reports, laboratory and other test data, was performed as part of comprehensive evaluation and provision of chronic care management services.   SDOH:  (Social Determinants of Health) assessments and interventions performed: {yes/no:20286}  SDOH Screenings   Alcohol Screen: Not on file  Depression (PHQ2-9): Low Risk    PHQ-2 Score: 2  Financial Resource Strain: Not on file  Food Insecurity: Not on file  Housing: Not on file  Physical Activity: Not on file  Social Connections: Not on file  Stress: Not on file  Tobacco Use: Low Risk    Smoking Tobacco Use: Never   Smokeless Tobacco Use: Never   Passive Exposure: Not on file  Transportation Needs: Not on file    [place]  Allergies  Allergen Reactions   Amoxicillin Anaphylaxis   Augmentin [Amoxicillin-Pot Clavulanate]    Codeine Itching   Depakote [Divalproex Sodium]    Latex Hives   Lipitor [Atorvastatin]    Penicillins    Zocor [Simvastatin]    Septra [Sulfamethoxazole-Trimethoprim] Rash    Medications Reviewed Today     Reviewed by Maximiano Coss, NP (Nurse Practitioner) on 04/08/21 at 1138  Med List Status: <None>   Medication Order Taking? Sig Documenting Provider Last Dose Status Informant  albuterol (PROVENTIL HFA;VENTOLIN HFA) 108 (90 Base) MCG/ACT inhaler [id number] Yes Inhale 2 puffs into the lungs every 4 (four) hours as needed for wheezing or shortness of breath (cough, shortness of breath or wheezing.). Wardell Honour, MD Taking Active            Med Note Meyer Russel Jun 19, 2020  9:55 AM)    dorzolamide-timolol (COSOPT) 22.3-6.8 MG/ML ophthalmic solution [id number] Yes  [provider] Taking Active   famotidine (PEPCID) 40 MG tablet [id number] Yes Take 1 tablet (40 mg total) by mouth daily. Maximiano Coss, NP  Active   FLUoxetine (PROZAC) 20 MG capsule [id number] Yes Take 3 capsules (60 mg total) by mouth daily. Wendie Agreste, MD  Taking Active   hydrALAZINE (APRESOLINE) 25 MG tablet [id number] Yes Take 1 tablet (25 mg total) by mouth 3 (three) times daily as needed. Minna Merritts, MD Taking Active   latanoprost (XALATAN) 0.005 % ophthalmic solution [id number] Yes 1 drop at bedtime. [provider] Taking Active   mirtazapine (REMERON) 7.5 MG tablet [id number] Yes Take 1 tablet (7.5 mg total) by mouth at bedtime as needed. Jacelyn Pi, Lilia Argue, MD Taking Active   Omega-3 Fatty  Acids (FISH OIL OMEGA-3) 1000 MG CAPS [id number] Yes Take by mouth. [provider] Taking Active   pantoprazole (PROTONIX) 40 MG tablet [id number] Yes Take 1 tablet (40 mg total) by mouth daily. Wendie Agreste, MD Taking Active   rosuvastatin (CRESTOR) 10 MG tablet [id number] Yes Take 1 tablet (10 mg total) by mouth daily. Wendie Agreste, MD Taking Active   spironolactone (ALDACTONE) 50 MG tablet [id number] Yes TAKE 1 TABLET BY MOUTH ONCE A DAY Wendie Agreste, MD Taking Active   sucralfate (CARAFATE) 1 g tablet [id number] Yes Take 1 tablet (1 g total) by mouth 4 (four) times daily -  with meals and at bedtime. Maximiano Coss, NP  Active   valACYclovir (VALTREX) 1000 MG tablet [id number] Yes Take 1,000 mg by mouth 2 (two) times daily as needed. [provider] Taking Active   VITAMIN D PO [id number] Yes Take by mouth. [provider] Taking Active             Patient Active Problem List   Diagnosis Date Noted   Encounter for screening colonoscopy    Vitamin D deficiency 08/02/2018   Prediabetes 08/02/2018   Carpal tunnel syndrome 02/10/2018   Tinnitus 06/25/2016   Numbness 04/13/2016   Loss of smell 04/13/2016   Pure hypercholesterolemia 11/30/2014   Degenerative disc disease, cervical 04/24/2014   Essential hypertension, benign 10/19/2013   Insomnia 10/19/2013   Herpes labialis 10/19/2013   Anxiety and depression 07/25/2012   GERD (gastroesophageal reflux disease) 07/25/2012    Immunization  History  Administered Date(s) Administered   Fluad Quad(high Dose 65+) 02/10/2019   Influenza,inj,Quad PF,6+ Mos 02/22/2015, 02/11/2016, 02/10/2017, 02/28/2018   Influenza-Unspecified 04/01/2020, 03/11/2021   PFIZER(Purple Top)SARS-COV-2 Vaccination 07/27/2019, 08/22/2019   Pneumococcal Conjugate-13 04/26/2015   Pneumococcal Polysaccharide-23 05/12/2016    Conditions to be addressed/monitored:  HTN, GERD, Anxiety, Depression, Hypercholesterolemia, Pre-Dm, insomnia  There are no care plans that you recently modified to display for this patient.    Medication Assistance: {MEDASSISTANCEINFO:25044}  Compliance/Adherence/Medication fill history: Care Gaps: ***  Star-Rating Drugs: ***  Patient's preferred pharmacy is:  [place], [place] [address] [place] [address] Phone: [phone] Fax: [phone]  Uses pill box? {Yes or If no, why not?:20788} Pt endorses ***% compliance  We discussed: {Pharmacy options:24294} Patient decided to: {US Pharmacy Plan:23885}  Care Plan and Follow Up Patient Decision:  {FOLLOWUP:24991}  Plan: {CM FOLLOW UP OIZT:24580}  ***  Current Barriers:  {pharmacybarriers:24917}  Pharmacist Clinical Goal(s):  Patient will {PHARMACYGOALCHOICES:24921} through collaboration with PharmD and provider.   Interventions: 1:1 collaboration with Wendie Agreste, MD regarding development and update of comprehensive plan of care as evidenced by provider attestation and co-signature Inter-disciplinary care team collaboration (see longitudinal plan of care) Comprehensive medication review performed; medication list updated in electronic medical record  Hypertension (BP goal {CHL HP UPSTREAM Pharmacist BP ranges:3342989702}) -{US controlled/uncontrolled:25276} -Current treatment: *** -Medications previously tried: ***  -Current home readings: *** -Current dietary habits: *** -Current exercise habits:  *** -{ACTIONS;DENIES/REPORTS:21021675::"Denies"} hypotensive/hypertensive symptoms -Educated on {CCM BP Counseling:25124} -Counseled to monitor BP at home ***, document, and provide log at future appointments -{CCMPHARMDINTERVENTION:25122}  Hyperlipidemia: (LDL goal < ***) -{US controlled/uncontrolled:25276} -Current treatment: *** -Medications previously tried: ***  -Current dietary patterns: *** -Current exercise habits: *** -Educated on {CCM HLD Counseling:25126} -{CCMPHARMDINTERVENTION:25122}  Diabetes (A1c goal {A1c goals:23924}) -{US controlled/uncontrolled:25276} -Current medications: *** -Medications previously tried: ***  -Current home glucose readings fasting glucose: *** post prandial glucose: *** -{  ACTIONS;DENIES/REPORTS:21021675::"Denies"} hypoglycemic/hyperglycemic symptoms -Current meal patterns:  breakfast: ***  lunch: ***  dinner: *** snacks: *** drinks: *** -Current exercise: *** -Educated on {CCM DM COUNSELING:25123} -Counseled to check feet daily and get yearly eye exams -{CCMPHARMDINTERVENTION:25122}  Depression/Anxiety (Goal: ***) -{US controlled/uncontrolled:25276} -Current treatment: *** -Medications previously tried/failed: *** -PHQ9: *** -GAD7: *** -Connected with *** for mental health support -Educated on {CCM mental health counseling:25127} -{CCMPHARMDINTERVENTION:25122}  Insomnia (Goal: ***) -{US controlled/uncontrolled:25276} -Current treatment  *** -Medications previously tried: ***  -{CCMPHARMDINTERVENTION:25122}  GERD (Goal: ***) -{US controlled/uncontrolled:25276} -Current treatment  *** -Medications previously tried: ***  -{CCMPHARMDINTERVENTION:25122}  Patient Goals/Self-Care Activities Patient will:  - {pharmacypatientgoals:24919}  Follow Up Plan: {CM FOLLOW UP PYPP:50932}

## 2021-04-29 ENCOUNTER — Telehealth: Payer: Self-pay | Admitting: Pharmacist

## 2021-04-29 NOTE — Chronic Care Management (AMB) (Addendum)
Chronic Care Management Pharmacy Assistant   Name: Maria Sweeney  MRN: [id number] DOB: [birth year]  Maria Sweeney is an 70 y.o. year old female who presents for herinitial CCM visit with the clinical pharmacist.   Reason for Encounter: Chart Prep for initial visit with CPP on 12/20122 @ 1:30 PM Telephone visit.     Recent office visits:  04/08/21 Maria Coss, NP - Family Medicine - Breast Tenderness - Mammogram and [place] ordered. famotidine (PEPCID) 40 MG tablet and sucralfate (CARAFATE) 1 g tablet prescribed. Follow up if no improvement.   Recent consult visits:  01/23/21 Maria Koyanagi, MD - Ophthalmology - Vitreous degeneration - no notes available.   12/30/20 Maria Koyanagi, MD - Ophthalmology - Marginal corneal ulcer, right eye - no notes available.   12/30/20 Maria Koyanagi, MD - Ophthalmology - Vitreous degeneration - no notes available.   12/23/20 Maria Koyanagi, MD - Ophthalmology - Vitreous degeneration - no notes available.   10/29/20 Maria Koyanagi, MD - Ophthalmology -  - no notes available. Age related nuclear cataract.  Hospital visits: 11/20/2020  Medication Reconciliation was completed by comparing discharge summary, patients EMR and Pharmacy list, and upon discussion with patient.  Admitted to the hospital on 11/20/20 due to Cataract Extraction. Discharge date was 11/20/20. Discharged from [facility].  New?Medications Started at [facility] Discharge:?? None noted.   Medication Changes at Hospital Discharge: None noted.   Medications Discontinued at Hospital Discharge: None noted.   Medications that remain the same after Hospital Discharge:??  All other medications will remain the same.    Medications: Outpatient Encounter Medications as of 04/29/2021  Medication Sig   albuterol (PROVENTIL HFA;VENTOLIN HFA) 108 (90 Base) MCG/ACT inhaler Inhale 2 puffs into the lungs every 4 (four) hours as needed for wheezing or shortness  of breath (cough, shortness of breath or wheezing.).   dorzolamide-timolol (COSOPT) 22.3-6.8 MG/ML ophthalmic solution    famotidine (PEPCID) 40 MG tablet Take 1 tablet (40 mg total) by mouth daily.   FLUoxetine (PROZAC) 20 MG capsule TAKE 3 CAPSULES BY MOUTH DAILY   hydrALAZINE (APRESOLINE) 25 MG tablet Take 1 tablet (25 mg total) by mouth 3 (three) times daily as needed.   latanoprost (XALATAN) 0.005 % ophthalmic solution 1 drop at bedtime.   mirtazapine (REMERON) 7.5 MG tablet Take 1 tablet (7.5 mg total) by mouth at bedtime as needed.   Omega-3 Fatty Acids (FISH OIL OMEGA-3) 1000 MG CAPS Take by mouth.   pantoprazole (PROTONIX) 40 MG tablet Take 1 tablet (40 mg total) by mouth daily.   rosuvastatin (CRESTOR) 10 MG tablet Take 1 tablet (10 mg total) by mouth daily.   spironolactone (ALDACTONE) 50 MG tablet TAKE 1 TABLET BY MOUTH ONCE A DAY   sucralfate (CARAFATE) 1 g tablet Take 1 tablet (1 g total) by mouth 4 (four) times daily -  with meals and at bedtime.   valACYclovir (VALTREX) 1000 MG tablet Take 1,000 mg by mouth 2 (two) times daily as needed.   VITAMIN D PO Take by mouth.   No facility-administered encounter medications on file as of 04/29/2021.    Have you seen any other providers since your last visit? **  Any changes in your medications or health?   Any side effects from any medications?   Do you have an symptoms or problems not managed by your medications?  Any concerns about your health right now?   Has your provider asked that you check blood pressure, blood sugar, or follow  special diet at home?   Do you get any type of exercise on a regular basis?   Can you think of a goal you would like to reach for your health?   Do you have any problems getting your medications?   Is there anything that you would like to discuss during the appointment?   Please bring medications and supplements to appointment. Patient confirmed appointment date and time.    Care  Gaps  AWV: overdue Colonoscopy: done 12/20/19 DM Eye Exam: N/A DM Foot Exam: N/A Microalbumin: N/A HbgAIC: done 10/18/20 (6.4) DEXA: done 12/28/17 Mammogram: done 04/16/20 scheduled 05/15/21  Star Rating Drugs: rosuvastatin (CRESTOR) 10 MG tablet - last filled 03/28/21 90 days    Future Appointments  Date Time Provider [place]  04/30/2021  1:30 PM [facility] CCM PHARMACIST [facility] PEC  05/15/2021  7:40 AM [facility] DIAG TOMO 1 [facility]-BCGMM GI-BREAST CE  05/15/2021  7:50 AM [facility] [place] 1 GI-BCGUS GI-BREAST CE   Multiple attempts were made to contact patient. Attempts were unsuccessful. / ls,CMA   Maria Sweeney, [place] Pharmacist Assistant  [phone]

## 2021-04-30 ENCOUNTER — Telehealth: Payer: PPO

## 2021-05-15 ENCOUNTER — Other Ambulatory Visit: Payer: Self-pay | Admitting: Registered Nurse

## 2021-05-15 ENCOUNTER — Ambulatory Visit
Admission: RE | Admit: 2021-05-15 | Discharge: 2021-05-15 | Disposition: A | Discharge status: Home / Self Care | Payer: PPO | Source: Ambulatory Visit | Attending: Registered Nurse | Admitting: Registered Nurse

## 2021-05-15 ENCOUNTER — Ambulatory Visit: Payer: PPO

## 2021-05-15 DIAGNOSIS — N644 Mastodynia: Secondary | ICD-10-CM

## 2021-05-15 DIAGNOSIS — R922 Inconclusive mammogram: Secondary | ICD-10-CM | POA: Diagnosis not present

## 2021-07-07 DIAGNOSIS — D2262 Melanocytic nevi of left upper limb, including shoulder: Secondary | ICD-10-CM | POA: Diagnosis not present

## 2021-07-07 DIAGNOSIS — D2271 Melanocytic nevi of right lower limb, including hip: Secondary | ICD-10-CM | POA: Diagnosis not present

## 2021-07-07 DIAGNOSIS — D2272 Melanocytic nevi of left lower limb, including hip: Secondary | ICD-10-CM | POA: Diagnosis not present

## 2021-07-07 DIAGNOSIS — D2261 Melanocytic nevi of right upper limb, including shoulder: Secondary | ICD-10-CM | POA: Diagnosis not present

## 2021-07-07 DIAGNOSIS — Z85828 Personal history of other malignant neoplasm of skin: Secondary | ICD-10-CM | POA: Diagnosis not present

## 2021-07-08 ENCOUNTER — Other Ambulatory Visit: Payer: Self-pay | Admitting: Registered Nurse

## 2021-07-08 DIAGNOSIS — K219 Gastro-esophageal reflux disease without esophagitis: Secondary | ICD-10-CM

## 2021-07-25 ENCOUNTER — Other Ambulatory Visit: Payer: Self-pay | Admitting: Family Medicine

## 2021-07-25 DIAGNOSIS — E7800 Pure hypercholesterolemia, unspecified: Secondary | ICD-10-CM

## 2021-07-29 DIAGNOSIS — H401133 Primary open-angle glaucoma, bilateral, severe stage: Secondary | ICD-10-CM | POA: Diagnosis not present

## 2021-08-11 ENCOUNTER — Ambulatory Visit (INDEPENDENT_AMBULATORY_CARE_PROVIDER_SITE_OTHER): Payer: PPO | Admitting: Registered Nurse

## 2021-08-11 ENCOUNTER — Encounter: Payer: Self-pay | Admitting: Registered Nurse

## 2021-08-11 ENCOUNTER — Ambulatory Visit
Admission: RE | Admit: 2021-08-11 | Discharge: 2021-08-11 | Disposition: A | Discharge status: Home / Self Care | Payer: PPO | Source: Ambulatory Visit | Attending: Registered Nurse | Admitting: Registered Nurse

## 2021-08-11 VITALS — BP 111/73 | HR 67 | Temp 98.1°F | Resp 18 | Ht 60.0 in | Wt 143.6 lb

## 2021-08-11 DIAGNOSIS — Z23 Encounter for immunization: Secondary | ICD-10-CM | POA: Diagnosis not present

## 2021-08-11 DIAGNOSIS — M79652 Pain in left thigh: Secondary | ICD-10-CM | POA: Diagnosis not present

## 2021-08-11 DIAGNOSIS — M25551 Pain in right hip: Secondary | ICD-10-CM | POA: Diagnosis not present

## 2021-08-11 DIAGNOSIS — M5136 Other intervertebral disc degeneration, lumbar region: Secondary | ICD-10-CM

## 2021-08-11 DIAGNOSIS — R200 Anesthesia of skin: Secondary | ICD-10-CM

## 2021-08-11 DIAGNOSIS — M545 Low back pain, unspecified: Secondary | ICD-10-CM | POA: Diagnosis not present

## 2021-08-11 DIAGNOSIS — M25561 Pain in right knee: Secondary | ICD-10-CM

## 2021-08-11 DIAGNOSIS — G8929 Other chronic pain: Secondary | ICD-10-CM

## 2021-08-11 DIAGNOSIS — M7989 Other specified soft tissue disorders: Secondary | ICD-10-CM | POA: Diagnosis not present

## 2021-08-11 MED ORDER — SHINGRIX 50 MCG/0.5ML IM SUSR: 0.5000 mL | Freq: Once | INTRAMUSCULAR | 1 refills | Status: AC

## 2021-08-11 MED ORDER — MELOXICAM 15 MG PO TABS: 15.0000 mg | ORAL_TABLET | Freq: Every day | ORAL | 0 refills | Status: DC

## 2021-08-11 MED ORDER — METHYLPREDNISOLONE 4 MG PO TBPK: ORAL_TABLET | ORAL | 0 refills | Status: DC

## 2021-08-11 NOTE — Patient Instructions (Addendum)
Maria Sweeney -  ? ?Great to see you ? ?Let's get xrays - ordered to [place] at [facility]  ? ?Shingles shot sent to pharmacy. ? ?Thank you, ? ?[name]  ? ? ? ?If you have lab work done today you will be contacted with your lab results within the next 2 weeks.  If you have not heard from [place] then please contact us. The fastest way to get your results is to register for My Chart. ? ? ?IF you received an x-ray today, you will receive an invoice from [facility]. Please contact [facility] Radiology at [phone] with questions or concerns regarding your invoice.  ? ?IF you received labwork today, you will receive an invoice from [place]. Please contact [facility] at [phone] with questions or concerns regarding your invoice.  ? ?Our billing staff will not be able to assist you with questions regarding bills from these companies. ? ?You will be contacted with the lab results as soon as they are available. The fastest way to get your results is to activate your My Chart account. Instructions are located on the last page of this paperwork. If you have not heard from [place] regarding the results in 2 weeks, please contact this office. ?  ? ? ?

## 2021-08-11 NOTE — Progress Notes (Signed)
? ?Acute Office Visit ? ?Subjective:  ? ? Patient ID: Maria Sweeney, female    DOB: 01-17-1951, 71 y.o.   MRN: [id number] ? ?Chief Complaint  ?Patient presents with  ? Knee Pain  ?  Patient states she has been having some right hip and knee . Patient states her knee is swollen.  ? ? ?HPI ?Patient is in today for pain ? ?R hip and knee ?Aching in hip and knee ?Knee feels swollen.  ?Hx of knee fracture some time ago, routine healing.  ?Notes some instability when walking or climbing steps.  ? ?Numbness in Left arm ?Persistent, worsening.  ?First four digits numb near constantly ?Some neck pain. ?Hx of ddd in lumbar spine ?No recent cervical spine imaging ? ?Outpatient Medications Prior to Visit  ?Medication Sig Dispense Refill  ? albuterol (PROVENTIL HFA;VENTOLIN HFA) 108 (90 Base) MCG/ACT inhaler Inhale 2 puffs into the lungs every 4 (four) hours as needed for wheezing or shortness of breath (cough, shortness of breath or wheezing.). 1 Inhaler 1  ? dorzolamide-timolol (COSOPT) 22.3-6.8 MG/ML ophthalmic solution     ? famotidine (PEPCID) 40 MG tablet TAKE 1 TABLET BY MOUTH ONCE DAILY 90 tablet 0  ? FLUoxetine (PROZAC) 20 MG capsule TAKE 3 CAPSULES BY MOUTH DAILY 180 capsule 1  ? hydrALAZINE (APRESOLINE) 25 MG tablet Take 1 tablet (25 mg total) by mouth 3 (three) times daily as needed. 90 tablet 1  ? latanoprost (XALATAN) 0.005 % ophthalmic solution 1 drop at bedtime.    ? mirtazapine (REMERON) 7.5 MG tablet Take 1 tablet (7.5 mg total) by mouth at bedtime as needed. 30 tablet 2  ? Omega-3 Fatty Acids (FISH OIL OMEGA-3) 1000 MG CAPS Take by mouth.    ? pantoprazole (PROTONIX) 40 MG tablet Take 1 tablet (40 mg total) by mouth daily. 90 tablet 3  ? rosuvastatin (CRESTOR) 10 MG tablet TAKE 1 TABLET BY MOUTH ONCE A DAY 30 tablet 0  ? spironolactone (ALDACTONE) 50 MG tablet TAKE 1 TABLET BY MOUTH ONCE A DAY 90 tablet 1  ? valACYclovir (VALTREX) 1000 MG tablet Take 1,000 mg by mouth 2 (two) times daily as needed.    ?  VITAMIN D PO Take by mouth.    ? sucralfate (CARAFATE) 1 g tablet Take 1 tablet (1 g total) by mouth 4 (four) times daily -  with meals and at bedtime. 40 tablet 0  ? ?No facility-administered medications prior to visit.  ? ? ?Review of Systems ?Per hpi  ? ?   ?Objective:  ?  ?BP 111/73   Pulse 67   Temp 98.1 ?F (36.7 ?C) (Temporal)   Resp 18   Ht 5' (1.524 m)   Wt 143 lb 9.6 oz (65.1 kg)   SpO2 99%   BMI 28.04 kg/m?  ?Physical Exam ?Vitals and nursing note reviewed.  ?Constitutional:   ?   General: She is not in acute distress. ?   Appearance: Normal appearance. She is normal weight. She is not ill-appearing, toxic-appearing or diaphoretic.  ?Cardiovascular:  ?   Rate and Rhythm: Normal rate and regular rhythm.  ?   Heart sounds: Normal heart sounds. No murmur heard. ?  No friction rub. No gallop.  ?Pulmonary:  ?   Effort: Pulmonary effort is normal. No respiratory distress.  ?   Breath sounds: Normal breath sounds. No stridor. No wheezing, rhonchi or rales.  ?Chest:  ?   Chest wall: No tenderness.  ?Musculoskeletal:     ?  General: Tenderness (joint line R knee. C spine.) present. No swelling, deformity or signs of injury. Normal range of motion.  ?   Right lower leg: No edema.  ?   Left lower leg: No edema.  ?Skin: ?   General: Skin is warm and dry.  ?Neurological:  ?   General: No focal deficit present.  ?   Mental Status: She is alert and oriented to person, place, and time. Mental status is at baseline.  ?Psychiatric:     ?   Mood and Affect: Mood normal.     ?   Behavior: Behavior normal.     ?   Thought Content: Thought content normal.     ?   Judgment: Judgment normal.  ? ? ?No results found for any visits on 08/11/21. ? ? ?   ?Assessment & Plan:  ?1. Chronic pain of right hip ?- DG Hip Unilat W OR W/O Pelvis 2-3 Views Right; Future ?- meloxicam (MOBIC) 15 MG tablet; Take 1 tablet (15 mg total) by mouth daily.  Dispense: 30 tablet; Refill: 0 ?- methylPREDNISolone (MEDROL DOSEPAK) 4 MG TBPK tablet;  Take per package instructions. Do not skip doses. Finish entire supply.  Dispense: 1 each; Refill: 0 ? ?2. Chronic pain of right knee ?- DG Knee Complete 4 Views Right; Future ?- meloxicam (MOBIC) 15 MG tablet; Take 1 tablet (15 mg total) by mouth daily.  Dispense: 30 tablet; Refill: 0 ?- methylPREDNISolone (MEDROL DOSEPAK) 4 MG TBPK tablet; Take per package instructions. Do not skip doses. Finish entire supply.  Dispense: 1 each; Refill: 0 ? ?3. DDD (degenerative disc disease), lumbar ?- DG Lumbar Spine Complete; Future ? ?4. Left arm numbness ?- DG Cervical Spine Complete; Future ? ?5. Need for shingles vaccine ?- Zoster Vaccine Adjuvanted [facility]) injection; Inject 0.5 mLs into the muscle once for 1 dose. Repeat in 2-6 months.  Dispense: 0.5 mL; Refill: 1 ? ? ? ?Meds ordered this encounter  ?Medications  ? meloxicam (MOBIC) 15 MG tablet  ?  Sig: Take 1 tablet (15 mg total) by mouth daily.  ?  Dispense:  30 tablet  ?  Refill:  0  ?  Order Specific Question:   Supervising Provider  ?  Answer:   Carlota Raspberry, JEFFREY R [[id number]]  ? methylPREDNISolone (MEDROL DOSEPAK) 4 MG TBPK tablet  ?  Sig: Take per package instructions. Do not skip doses. Finish entire supply.  ?  Dispense:  1 each  ?  Refill:  0  ?  Order Specific Question:   Supervising Provider  ?  Answer:   Carlota Raspberry, JEFFREY R [[id number]]  ? Zoster Vaccine Adjuvanted [facility]) injection  ?  Sig: Inject 0.5 mLs into the muscle once for 1 dose. Repeat in 2-6 months.  ?  Dispense:  0.5 mL  ?  Refill:  1  ?  Order Specific Question:   Supervising Provider  ?  Answer:   Carlota Raspberry, JEFFREY R [[id number]]  ? ? ?Return if symptoms worsen or fail to improve. ? ?PLAN ?Xrays ordered ?Medrol dose pack and mobic for relief. ?Consider ortho depending on xrays  ?Patient encouraged to call clinic with any questions, comments, or concerns. ? ?Maximiano Coss, NP ?

## 2021-08-21 ENCOUNTER — Other Ambulatory Visit: Payer: Self-pay | Admitting: Family Medicine

## 2021-08-21 DIAGNOSIS — H401133 Primary open-angle glaucoma, bilateral, severe stage: Secondary | ICD-10-CM | POA: Diagnosis not present

## 2021-08-22 ENCOUNTER — Other Ambulatory Visit: Payer: Self-pay | Admitting: Family Medicine

## 2021-09-04 ENCOUNTER — Other Ambulatory Visit: Payer: Self-pay | Admitting: Family Medicine

## 2021-09-04 DIAGNOSIS — E7800 Pure hypercholesterolemia, unspecified: Secondary | ICD-10-CM

## 2021-09-05 ENCOUNTER — Ambulatory Visit (INDEPENDENT_AMBULATORY_CARE_PROVIDER_SITE_OTHER): Payer: PPO | Admitting: Family Medicine

## 2021-09-05 ENCOUNTER — Encounter: Payer: Self-pay | Admitting: Family Medicine

## 2021-09-05 VITALS — BP 136/70 | HR 63 | Temp 98.2°F | Resp 16 | Ht 60.0 in | Wt 144.8 lb

## 2021-09-05 DIAGNOSIS — K219 Gastro-esophageal reflux disease without esophagitis: Secondary | ICD-10-CM | POA: Diagnosis not present

## 2021-09-05 DIAGNOSIS — M5136 Other intervertebral disc degeneration, lumbar region: Secondary | ICD-10-CM | POA: Diagnosis not present

## 2021-09-05 DIAGNOSIS — M25561 Pain in right knee: Secondary | ICD-10-CM

## 2021-09-05 DIAGNOSIS — F419 Anxiety disorder, unspecified: Secondary | ICD-10-CM

## 2021-09-05 DIAGNOSIS — M25551 Pain in right hip: Secondary | ICD-10-CM

## 2021-09-05 DIAGNOSIS — F32A Depression, unspecified: Secondary | ICD-10-CM

## 2021-09-05 DIAGNOSIS — M5030 Other cervical disc degeneration, unspecified cervical region: Secondary | ICD-10-CM | POA: Diagnosis not present

## 2021-09-05 DIAGNOSIS — R7303 Prediabetes: Secondary | ICD-10-CM | POA: Diagnosis not present

## 2021-09-05 DIAGNOSIS — Z0000 Encounter for general adult medical examination without abnormal findings: Secondary | ICD-10-CM | POA: Diagnosis not present

## 2021-09-05 DIAGNOSIS — R32 Unspecified urinary incontinence: Secondary | ICD-10-CM

## 2021-09-05 DIAGNOSIS — E7800 Pure hypercholesterolemia, unspecified: Secondary | ICD-10-CM | POA: Diagnosis not present

## 2021-09-05 DIAGNOSIS — G8929 Other chronic pain: Secondary | ICD-10-CM

## 2021-09-05 MED ORDER — ROSUVASTATIN CALCIUM 10 MG PO TABS: 10.0000 mg | ORAL_TABLET | Freq: Every day | ORAL | 3 refills | Status: DC

## 2021-09-05 MED ORDER — SPIRONOLACTONE 50 MG PO TABS: 50.0000 mg | ORAL_TABLET | Freq: Every day | ORAL | 1 refills | Status: DC

## 2021-09-05 MED ORDER — FAMOTIDINE 40 MG PO TABS: 40.0000 mg | ORAL_TABLET | Freq: Every day | ORAL | 0 refills | Status: DC

## 2021-09-05 MED ORDER — MELOXICAM 15 MG PO TABS: 15.0000 mg | ORAL_TABLET | Freq: Every day | ORAL | 0 refills | Status: DC

## 2021-09-05 MED ORDER — PANTOPRAZOLE SODIUM 40 MG PO TBEC: 40.0000 mg | DELAYED_RELEASE_TABLET | Freq: Every day | ORAL | 3 refills | Status: DC

## 2021-09-05 MED ORDER — FLUOXETINE HCL 20 MG PO CAPS: 60.0000 mg | ORAL_CAPSULE | Freq: Every day | ORAL | 1 refills | Status: DC

## 2021-09-05 NOTE — Patient Instructions (Addendum)
I will refer you to ortho for your back, knee, and neck/arm. Meloxicam for now.  ?No med changes at this time.  ?See info on incontinence - will discuss at follow up as well.  ?Return to the clinic or go to the nearest emergency room if any of your symptoms worsen or new symptoms occur. ?Call for lab visit appointment.  ? ? ? ?Preventive Care 61 Years and Older, Female ?Preventive care refers to lifestyle choices and visits with your health care provider that can promote health and wellness. Preventive care visits are also called wellness exams. ?What can I expect for my preventive care visit? ?Counseling ?Your health care provider may ask you questions about your: ?Medical history, including: ?Past medical problems. ?Family medical history. ?Pregnancy and menstrual history. ?History of falls. ?Current health, including: ?Memory and ability to understand (cognition). ?Emotional well-being. ?Home life and relationship well-being. ?Sexual activity and sexual health. ?Lifestyle, including: ?Alcohol, nicotine or tobacco, and drug use. ?Access to firearms. ?Diet, exercise, and sleep habits. ?Work and work Statistician. ?Sunscreen use. ?Safety issues such as seatbelt and bike helmet use. ?Physical exam ?Your health care provider will check your: ?Height and weight. These may be used to calculate your BMI (body mass index). BMI is a measurement that tells if you are at a healthy weight. ?Waist circumference. This measures the distance around your waistline. This measurement also tells if you are at a healthy weight and may help predict your risk of certain diseases, such as type 2 diabetes and high blood pressure. ?Heart rate and blood pressure. ?Body temperature. ?Skin for abnormal spots. ?What immunizations do I need? ? ?Vaccines are usually given at various ages, according to a schedule. Your health care provider will recommend vaccines for you based on your age, medical history, and lifestyle or other factors, such as  travel or where you work. ?What tests do I need? ?Screening ?Your health care provider may recommend screening tests for certain conditions. This may include: ?Lipid and cholesterol levels. ?Hepatitis C test. ?Hepatitis B test. ?HIV (human immunodeficiency virus) test. ?STI (sexually transmitted infection) testing, if you are at risk. ?Lung cancer screening. ?Colorectal cancer screening. ?Diabetes screening. This is done by checking your blood sugar (glucose) after you have not eaten for a while (fasting). ?Mammogram. Talk with your health care provider about how often you should have regular mammograms. ?BRCA-related cancer screening. This may be done if you have a family history of breast, ovarian, tubal, or peritoneal cancers. ?Bone density scan. This is done to screen for osteoporosis. ?Talk with your health care provider about your test results, treatment options, and if necessary, the need for more tests. ?Follow these instructions at home: ?Eating and drinking ? ?Eat a diet that includes fresh fruits and vegetables, whole grains, lean protein, and low-fat dairy products. Limit your intake of foods with high amounts of sugar, saturated fats, and salt. ?Take vitamin and mineral supplements as recommended by your health care provider. ?Do not drink alcohol if your health care provider tells you not to drink. ?If you drink alcohol: ?Limit how much you have to 0-1 drink a day. ?Know how much alcohol is in your drink. In the [place] one drink equals one 12 oz bottle of beer (355 mL), one 5 oz glass of wine (148 mL), or one 1? oz glass of hard liquor (44 mL). ?Lifestyle ?Brush your teeth every morning and night with fluoride toothpaste. Floss one time each day. ?Exercise for at least 30 minutes 5 or more  days each week. ?Do not use any products that contain nicotine or tobacco. These products include cigarettes, chewing tobacco, and vaping devices, such as e-cigarettes. If you need help quitting, ask your health care  provider. ?Do not use drugs. ?If you are sexually active, practice safe sex. Use a condom or other form of protection in order to prevent STIs. ?Take aspirin only as told by your health care provider. Make sure that you understand how much to take and what form to take. Work with your health care provider to find out whether it is safe and beneficial for you to take aspirin daily. ?Ask your health care provider if you need to take a cholesterol-lowering medicine (statin). ?Find healthy ways to manage stress, such as: ?Meditation, yoga, or listening to music. ?Journaling. ?Talking to a trusted person. ?Spending time with friends and family. ?Minimize exposure to UV radiation to reduce your risk of skin cancer. ?Safety ?Always wear your seat belt while driving or riding in a vehicle. ?Do not drive: ?If you have been drinking alcohol. Do not ride with someone who has been drinking. ?When you are tired or distracted. ?While texting. ?If you have been using any mind-altering substances or drugs. ?Wear a helmet and other protective equipment during sports activities. ?If you have firearms in your house, make sure you follow all gun safety procedures. ?What's next? ?Visit your health care provider once a year for an annual wellness visit. ?Ask your health care provider how often you should have your eyes and teeth checked. ?Stay up to date on all vaccines. ?This information is not intended to replace advice given to you by your health care provider. Make sure you discuss any questions you have with your health care provider. ?Document Revised: 10/23/2020 Document Reviewed: 10/23/2020 ?Elsevier Patient Education ? [employer]. ?Urinary Incontinence ?Urinary incontinence refers to a condition in which a person is unable to control where and when to pass urine. A person with this condition will urinate involuntarily. This means that the person urinates when he or she does not mean to. ?What are the causes? ?This  condition may be caused by: ?Medicines. ?Infections. ?Constipation. ?Overactive bladder muscles. ?Weak bladder muscles. ?Weak pelvic floor muscles. These muscles provide support for the bladder, intestine, and, in women, the uterus. ?Enlarged prostate in men. The prostate is a gland near the bladder. When it gets too big, it can pinch the urethra. With the urethra blocked, the bladder can weaken and lose the ability to empty properly. ?Surgery. ?Emotional factors, such as anxiety, stress, or post-traumatic stress disorder (PTSD). ?Spinal cord injury, nerve injury, or other neurological conditions. ?Pelvic organ prolapse. This happens in women when organs move out of place and into the vagina. This movement can prevent the bladder and urethra from working properly. ?What increases the risk? ?The following factors may make you more likely to develop this condition: ?Age. The older you are, the higher the risk. ?Obesity. ?Being physically inactive. ?Pregnancy and childbirth. ?Menopause. ?Diseases that affect the nerves or spinal cord. ?Long-term, or chronic, coughing. This can increase pressure on the bladder and pelvic floor muscles. ?What are the signs or symptoms? ?Symptoms may vary depending on the type of urinary incontinence you have. They include: ?A sudden urge to urinate, and passing urine involuntarily before you can get to a bathroom (urge incontinence). ?Suddenly passing urine when doing activities that force urine to pass, such as coughing, laughing, exercising, or sneezing (stress incontinence). ?Needing to urinate often but urinating only a  small amount, or constantly dribbling urine (overflow incontinence). ?Urinating because you cannot get to the bathroom in time due to a physical disability, such as arthritis or injury, or due to a communication or thinking problem, such as Alzheimer's disease (functional incontinence). ?How is this diagnosed? ?This condition may be diagnosed based on: ?Your medical  history. ?A physical exam. ?Tests, such as: ?Urine tests. ?X-rays of your kidney and bladder. ?Ultrasound. ?CT scan. ?Cystoscopy. In this procedure, a health care provider inserts a tube with a light and camer

## 2021-09-05 NOTE — Progress Notes (Signed)
? ?Subjective:  ?Patient ID: Maria Sweeney, female    DOB: 07/08/1950  Age: 71 y.o. MRN: [id number] ? ?CC:  ?Chief Complaint  ?Patient presents with  ? Annual Exam  ?  Pt has been doing well notes back pain from a few weeks ago has returned and is wondering if she needs to see a specialist   ? ? ?HPI ?Maria Sweeney presents for Annual Exam initially, also back pain as above, follow up of chronic conditions.  ? ?Back, neck pain, hip pain: ?Notes reviewed, was seen by my colleague few weeks ago with chronic pain of right hip and right knee, lumbar degenerative disc disease.  Treated with Medrol Dosepak, meloxicam.  Cervical spine x-ray April 4 for arm numbness.  No fracture dislocation but cervical spondylosis with encroachment of neural foramina from C3-T1 more at C4-5 and C5-6.  Physical therapy versus Ortho eval discussed.  Lumbar spine x-ray with degenerative changes in the lumbar spine more so at L4-5 and L5-S1 ?Some help with steroids, but pain came back - not as severe - low back.  ?Numbness in left arm past year improved with steroids, minimal tingling now. ?Low back pain restarted, and moving down R leg again. Some instability of R knee at times.  No bowel incontinence, but some urinary incontinence at times, for years, some more episodes at night past few years, no saddle anesthesia, no lower extremity weakness.  ?Tx: alleve prior, now meloxicam nightly.  ? ?Prediabetes: ?Exercising 3 times per week, walking.  ?No soda/sweet tea. Occasional juice.  ?Lab Results  ?Component Value Date  ? HGBA1C 6.4 10/18/2020  ? ?Wt Readings from Last 3 Encounters:  ?09/05/21 144 lb 12.8 oz (65.7 kg)  ?08/11/21 143 lb 9.6 oz (65.1 kg)  ?04/08/21 145 lb 9.6 oz (66 kg)  ? ?GERD: ?Last discussed at video visit in June 2022.  Treatment resistant, discussed with my colleague in November.  Famotidine, sucralfate prescribed that time.  Currently taking Pepcid, Protonix, trigger avoidance - doing better.    ? ?Hyperlipidemia: ?Crestor - no new side effects ?Lab Results  ?Component Value Date  ? CHOL 187 06/13/2020  ? HDL 51 06/13/2020  ? [place] 98 06/13/2020  ? TRIG 227 (H) 06/13/2020  ? CHOLHDL 3.7 06/13/2020  ? ?Lab Results  ?Component Value Date  ? ALT 35 10/18/2020  ? AST 26 10/18/2020  ? ALKPHOS 95 10/18/2020  ? BILITOT 0.8 10/18/2020  ? ? ?Hypertension: ?Spirinolactone '50mg'$  qd. Not needing hydralazine.  ?Home readings:130-140/70-80.  ?No new side effects.  ?BP Readings from Last 3 Encounters:  ?09/05/21 136/70  ?08/11/21 111/73  ?04/08/21 124/79  ? ?Lab Results  ?Component Value Date  ? CREATININE 0.82 10/18/2020  ? ? ?Depression: ?Prozac 60 mg daily, doing ok. Overall stable. No counseling, no SI.  ?Has mirtazapine - not needed.  ? ? ? ?  09/05/2021  ? 11:07 AM 08/11/2021  ? 11:16 AM 10/17/2020  ?  4:43 PM 06/13/2020  ? 10:59 AM 04/12/2020  ?  2:38 PM  ?Depression screen PHQ 2/9  ?Decreased Interest '1 1 1 '$ 0 0  ?Down, Depressed, Hopeless 1 1 0 0 0  ?PHQ - 2 Score '2 2 1 '$ 0 0  ?Altered sleeping 0 3 0    ?Tired, decreased energy '1 1 1    '$ ?Change in appetite 0 0 0    ?Feeling bad or failure about yourself  0 0 0    ?Trouble concentrating 1 0 0    ?Moving  slowly or fidgety/restless 0 0 0    ?Suicidal thoughts 0 0 0    ?PHQ-9 Score '4 6 2    '$ ?Difficult doing work/chores  Not difficult at all     ? ? ?Health Maintenance  ?Topic Date Due  ? COVID-19 Vaccine (3 - Booster for Pfizer series) 09/21/2021 (Originally 10/17/2019)  ? Zoster Vaccines- Shingrix (1 of 2) 12/05/2021 (Originally 02/14/2000)  ? TETANUS/TDAP  09/06/2022 (Originally 02/13/1969)  ? INFLUENZA VACCINE  12/09/2021  ? MAMMOGRAM  05/16/2023  ? COLONOSCOPY (Pts 45-60yr Insurance coverage will need to be confirmed)  12/19/2029  ? Pneumonia Vaccine 72 Years old  Completed  ? DEXA SCAN  Completed  ? Hepatitis C Screening  Completed  ? HPV VACCINES  Aged Out  ?Mammogram utd.  ?S/p partial hysterectomy.  ?Colonoscopy utd.  ? ?Immunization History  ?Administered Date(s)  Administered  ? Fluad Quad(high Dose 65+) 02/10/2019  ? Influenza,inj,Quad PF,6+ Mos 02/22/2015, 02/11/2016, 02/10/2017, 02/28/2018  ? Influenza-Unspecified 04/01/2020, 03/11/2021  ? PFIZER(Purple Top)SARS-COV-2 Vaccination 07/27/2019, 08/22/2019  ? Pneumococcal Conjugate-13 04/26/2015  ? Pneumococcal Polysaccharide-23 05/12/2016  ?Shingrix - will check at pharmacy.  ? ?No results found. ?Regular eye care ? ?Dental:Yes and Within Last 6 months more frequent this past year.  ? ?Alcohol:none ? ?Tobacco: none ? ?Exercise: as above.  ? ? ?History ?Patient Active Problem List  ? Diagnosis Date Noted  ? Encounter for screening colonoscopy   ? Vitamin D deficiency 08/02/2018  ? Prediabetes 08/02/2018  ? Carpal tunnel syndrome 02/10/2018  ? Tinnitus 06/25/2016  ? Numbness 04/13/2016  ? Loss of smell 04/13/2016  ? Pure hypercholesterolemia 11/30/2014  ? Degenerative disc disease, cervical 04/24/2014  ? Essential hypertension, benign 10/19/2013  ? Insomnia 10/19/2013  ? Herpes labialis 10/19/2013  ? Anxiety and depression 07/25/2012  ? GERD (gastroesophageal reflux disease) 07/25/2012  ? ?Past Medical History:  ?Diagnosis Date  ? Anxiety   ? Arthritis   ? Phreesia 04/12/2020  ? Asthma   ? Cataract   ? [employer]reesia 10/25/2019  ? Cervical spondylosis   ? Complication of anesthesia   ? with colonoscopy woke up twice  ? Depression   ? Depression   ? Phreesia 04/12/2020  ? GERD (gastroesophageal reflux disease)   ? Glaucoma   ? [employer]reesia 10/25/2019  ? Hyperlipidemia   ? Hypertension   ? IBS (irritable bowel syndrome)   ? Constipation with diarrhea.  ? Oral herpes   ? ?Past Surgical History:  ?Procedure Laterality Date  ? ABDOMINAL HYSTERECTOMY  1978  ? DUB; ovaries intact.  ? APPENDECTOMY  1988  ? BREAST BIOPSY Bilateral 2009  ? Calcium Deposit- 1980's and also in 2009/ Right side 1995  ? BREAST EXCISIONAL BIOPSY Bilateral   ? BREAST SURGERY N/A   ? Phreesia 10/25/2019  ? CATARACT EXTRACTION W/PHACO Left 10/23/2020  ? Procedure:  CATARACT EXTRACTION PHACO AND INTRAOCULAR LENS PLACEMENT (IOC) LEFT TORIC LENS [place] DUAL BLADE GONIOTOMY,  VISION BLUE 7.69 00:53.0;  Surgeon: BLeandrew Koyanagi MD;  [place] [facility]  Service: Ophthalmology;  Laterality: Left;  ? CATARACT EXTRACTION W/PHACO Right 11/20/2020  ? Procedure: CATARACT EXTRACTION PHACO AND INTRAOCULAR LENS PLACEMENT ([place] RIGHT LENS [place] DUAL BLADE GONIOTOMY VISION BLUE 8.58 01:24.9;  Surgeon: BLeandrew Koyanagi MD;  [place] [facility]  Service: Ophthalmology;  Laterality: Right;  latex  ? COLONOSCOPY  05/11/2009  ? Normal.  Previous colonoscopy with polyps. [place].    ? COLONOSCOPY WITH PROPOFOL N/A 12/20/2019  ? Procedure: COLONOSCOPY WITH PROPOFOL;  Surgeon:  Lin Landsman, MD;  Location: [facility];  Service: Gastroenterology;  Laterality: N/A;  ? DEBRIDEMENT TENNIS ELBOW    ? ROTATOR CUFF REPAIR    ? TONSILLECTOMY    ? ?Allergies  ?Allergen Reactions  ? Amoxicillin Anaphylaxis  ? Augmentin [Amoxicillin-Pot Clavulanate]   ? Codeine Itching  ? Depakote [Divalproex Sodium]   ? Latex Hives  ? Lipitor [Atorvastatin]   ? Penicillins   ? Zocor [Simvastatin]   ? Septra [Sulfamethoxazole-Trimethoprim] Rash  ? ?Prior to Admission medications   ?Medication Sig Start Date End Date Taking? Authorizing Provider  ?albuterol (PROVENTIL HFA;VENTOLIN HFA) 108 (90 Base) MCG/ACT inhaler Inhale 2 puffs into the lungs every 4 (four) hours as needed for wheezing or shortness of breath (cough, shortness of breath or wheezing.). 02/11/16  Yes Wardell Honour, MD  ?dorzolamide-timolol (COSOPT) 22.3-6.8 MG/ML ophthalmic solution  12/25/19  Yes [provider]  ?famotidine (PEPCID) 40 MG tablet TAKE 1 TABLET BY MOUTH ONCE DAILY 07/08/21  Yes Maximiano Coss, NP  ?FLUoxetine (PROZAC) 20 MG capsule TAKE 3 CAPSULES BY MOUTH DAILY 04/11/21  Yes Wendie Agreste, MD  ?hydrALAZINE (APRESOLINE) 25 MG tablet Take 1 tablet (25 mg total) by mouth 3 (three) times daily as  needed. 06/19/20  Yes Minna Merritts, MD  ?latanoprost (XALATAN) 0.005 % ophthalmic solution 1 drop at bedtime.   Yes [provider]  ?meloxicam (MOBIC) 15 MG tablet Take 1 tablet (15 mg total) by mouth daily. 08/11/21  Yes Orland Mustard,

## 2021-09-12 ENCOUNTER — Other Ambulatory Visit (INDEPENDENT_AMBULATORY_CARE_PROVIDER_SITE_OTHER): Payer: PPO

## 2021-09-12 DIAGNOSIS — E7800 Pure hypercholesterolemia, unspecified: Secondary | ICD-10-CM

## 2021-09-12 DIAGNOSIS — R7303 Prediabetes: Secondary | ICD-10-CM | POA: Diagnosis not present

## 2021-09-12 LAB — LIPID PANEL
Cholesterol: 178 mg/dL (ref 0–200)
HDL: 47.3 mg/dL (ref >39.00)
NonHDL: 130.48
Total CHOL/HDL Ratio: 4
Triglycerides: 304 mg/dL — ABNORMAL HIGH (ref 0.0–149.0)
VLDL: 60.8 mg/dL — ABNORMAL HIGH (ref 0.0–40.0)

## 2021-09-12 LAB — COMPREHENSIVE METABOLIC PANEL
ALT: 35 U/L (ref 0–35)
AST: 29 U/L (ref 0–37)
Albumin: 4.6 g/dL (ref 3.5–5.2)
Alkaline Phosphatase: 91 U/L (ref 39–117)
BUN: 17 mg/dL (ref 6–23)
CO2: 28 mEq/L (ref 19–32)
Calcium: 9.7 mg/dL (ref 8.4–10.5)
Chloride: 104 mEq/L (ref 96–112)
Creatinine, Ser: 0.9 mg/dL (ref 0.40–1.20)
GFR: 64.29 mL/min (ref >60.00)
Glucose, Bld: 113 mg/dL — ABNORMAL HIGH (ref 70–99)
Potassium: 4.5 mEq/L (ref 3.5–5.1)
Sodium: 141 mEq/L (ref 135–145)
Total Bilirubin: 0.7 mg/dL (ref 0.2–1.2)
Total Protein: 7 g/dL (ref 6.0–8.3)

## 2021-09-12 LAB — LDL CHOLESTEROL, DIRECT: Direct LDL: 99 mg/dL

## 2021-09-12 LAB — HEMOGLOBIN A1C: Hgb A1c MFr Bld: 6.7 % — ABNORMAL HIGH (ref 4.6–6.5)

## 2021-09-22 DIAGNOSIS — M1611 Unilateral primary osteoarthritis, right hip: Secondary | ICD-10-CM | POA: Diagnosis not present

## 2021-09-22 DIAGNOSIS — M2241 Chondromalacia patellae, right knee: Secondary | ICD-10-CM | POA: Diagnosis not present

## 2021-10-08 ENCOUNTER — Ambulatory Visit: Payer: PPO | Admitting: Family Medicine

## 2021-10-09 ENCOUNTER — Ambulatory Visit (INDEPENDENT_AMBULATORY_CARE_PROVIDER_SITE_OTHER): Payer: PPO | Admitting: Family Medicine

## 2021-10-09 ENCOUNTER — Encounter: Payer: Self-pay | Admitting: Family Medicine

## 2021-10-09 VITALS — BP 122/64 | HR 77 | Temp 98.7°F | Resp 16 | Ht 60.0 in | Wt 144.4 lb

## 2021-10-09 DIAGNOSIS — R490 Dysphonia: Secondary | ICD-10-CM

## 2021-10-09 DIAGNOSIS — K219 Gastro-esophageal reflux disease without esophagitis: Secondary | ICD-10-CM | POA: Diagnosis not present

## 2021-10-09 DIAGNOSIS — R32 Unspecified urinary incontinence: Secondary | ICD-10-CM

## 2021-10-09 DIAGNOSIS — S61209A Unspecified open wound of unspecified finger without damage to nail, initial encounter: Secondary | ICD-10-CM | POA: Diagnosis not present

## 2021-10-09 LAB — POCT URINALYSIS DIP (MANUAL ENTRY)
Bilirubin, UA: NEGATIVE
Glucose, UA: NEGATIVE mg/dL
Leukocytes, UA: NEGATIVE
Nitrite, UA: NEGATIVE
Spec Grav, UA: 1.025 (ref 1.010–1.025)
Urobilinogen, UA: 0.2 E.U./dL
pH, UA: 5 (ref 5.0–8.0)

## 2021-10-09 MED ORDER — OXYBUTYNIN CHLORIDE ER 5 MG PO TB24: 5.0000 mg | ORAL_TABLET | Freq: Every day | ORAL | 1 refills | Status: DC

## 2021-10-09 NOTE — Progress Notes (Signed)
Subjective:  Patient ID: Maria Sweeney, female    DOB: 1950/11/05  Age: 71 y.o. MRN: [id number]  CC:  Chief Complaint  Patient presents with   Urinary Incontinence    Pt reports every few months she will have a few episodes of wetting the bed at night, feels pressure when bending and notes difficulty controlling bladder from leaking    Gastroesophageal Reflux    Reports her voice is really hoarse frequently, and will have sore throat or episodes of vomiting at times    Wound Check    Pt cut her finger in the kitchen last night reports this is small but has bled on and off since the evening would like to ensure no further concern     HPI [name] presents for multiple concerns above  Urinary incontinence Briefly discussed at physical in March.  Sx's for few years. Slight worsening over that time.  Enuresis - will dream that using that using bathroom and will wake up to incontinence every 2-3 months for as long as she can remember - infrequent initially.  Nocturia few times per night. Some leakage if not able to get to BR in time. Some during day at times.  No prior meds or treatment.  Degenerative lumbar spine disease discussed at physical, no bowel incontinence.  No leg weakness. No dysuria/hematuria.   Hoarse voice Hx of GERD, treatment resistant prior - pepcid, protonix and trigger avoidance was doing better in April, some recent flairs of heartburn since. More hoarse past few years - comes and goes. No wt loss, fever, or difficulty swallowing   Wound of finger Left thumb, cut with knife while cutting biscuit dough.  Pad of thumb.  R hand dominant. Cleansed with water, pressure, bandage, then abx ointment this am. Some bleeding at times since.  No tetanus in past 10 yrs.   Immunization History  Administered Date(s) Administered   Fluad Quad(high Dose 65+) 02/10/2019   Influenza,inj,Quad PF,6+ Mos 02/22/2015, 02/11/2016, 02/10/2017, 02/28/2018   Influenza-Unspecified  04/01/2020, 03/11/2021   PFIZER(Purple Top)SARS-COV-2 Vaccination 07/27/2019, 08/22/2019   Pneumococcal Conjugate-13 04/26/2015   Pneumococcal Polysaccharide-23 05/12/2016     History Patient Active Problem List   Diagnosis Date Noted   Encounter for screening colonoscopy    Vitamin D deficiency 08/02/2018   Prediabetes 08/02/2018   Carpal tunnel syndrome 02/10/2018   Tinnitus 06/25/2016   Numbness 04/13/2016   Loss of smell 04/13/2016   Pure hypercholesterolemia 11/30/2014   Degenerative disc disease, cervical 04/24/2014   Essential hypertension, benign 10/19/2013   Insomnia 10/19/2013   Herpes labialis 10/19/2013   Anxiety and depression 07/25/2012   GERD (gastroesophageal reflux disease) 07/25/2012   Past Medical History:  Diagnosis Date   Anxiety    Arthritis    Phreesia 04/12/2020   Asthma    Cataract    Phreesia 10/25/2019   Cervical spondylosis    Complication of anesthesia    with colonoscopy woke up twice   Depression    Depression    Phreesia 04/12/2020   GERD (gastroesophageal reflux disease)    Glaucoma    Phreesia 10/25/2019   Hyperlipidemia    Hypertension    IBS (irritable bowel syndrome)    Constipation with diarrhea.   Oral herpes    Past Surgical History:  Procedure Laterality Date   ABDOMINAL HYSTERECTOMY  1978   DUB; ovaries intact.   APPENDECTOMY  1988   BREAST BIOPSY Bilateral 2009   Calcium Deposit- 1980's and also in 2009/  Right side 1995   BREAST EXCISIONAL BIOPSY Bilateral    BREAST SURGERY N/A    Phreesia 10/25/2019   CATARACT EXTRACTION W/PHACO Left 10/23/2020   Procedure: CATARACT EXTRACTION PHACO AND INTRAOCULAR LENS PLACEMENT (IOC) LEFT TORIC LENS KAHOOK DUAL BLADE GONIOTOMY,  VISION BLUE 7.69 00:53.0;  Surgeon: Leandrew Koyanagi, MD;  Location: [facility];  Service: Ophthalmology;  Laterality: Left;   CATARACT EXTRACTION W/PHACO Right 11/20/2020   Procedure: CATARACT EXTRACTION PHACO AND INTRAOCULAR LENS PLACEMENT  ([place]) RIGHT LENS KAHOOK DUAL BLADE GONIOTOMY VISION BLUE 8.58 01:24.9;  Surgeon: Leandrew Koyanagi, MD;  Location: [facility];  Service: Ophthalmology;  Laterality: Right;  latex   COLONOSCOPY  05/11/2009   Normal.  Previous colonoscopy with polyps. [place].     COLONOSCOPY WITH PROPOFOL N/A 12/20/2019   Procedure: COLONOSCOPY WITH PROPOFOL;  Surgeon: Lin Landsman, MD;  Location: [facility];  Service: Gastroenterology;  Laterality: N/A;   DEBRIDEMENT TENNIS ELBOW     ROTATOR CUFF REPAIR     TONSILLECTOMY     Allergies  Allergen Reactions   Amoxicillin Anaphylaxis   Augmentin [Amoxicillin-Pot Clavulanate]    Codeine Itching   Depakote [Divalproex Sodium]    Latex Hives   Lipitor [Atorvastatin]    Penicillins    Zocor [Simvastatin]    Septra [Sulfamethoxazole-Trimethoprim] Rash   Prior to Admission medications   Medication Sig Start Date End Date Taking? Authorizing Provider  albuterol (PROVENTIL HFA;VENTOLIN HFA) 108 (90 Base) MCG/ACT inhaler Inhale 2 puffs into the lungs every 4 (four) hours as needed for wheezing or shortness of breath (cough, shortness of breath or wheezing.). 02/11/16  Yes Wardell Honour, MD  dorzolamide-timolol (COSOPT) 22.3-6.8 MG/ML ophthalmic solution  12/25/19  Yes [provider]  famotidine (PEPCID) 40 MG tablet Take 1 tablet (40 mg total) by mouth daily. 09/05/21  Yes Wendie Agreste, MD  FLUoxetine (PROZAC) 20 MG capsule Take 3 capsules (60 mg total) by mouth daily. 09/05/21  Yes Wendie Agreste, MD  hydrALAZINE (APRESOLINE) 25 MG tablet Take 1 tablet (25 mg total) by mouth 3 (three) times daily as needed. 06/19/20  Yes Gollan, Kathlene November, MD  latanoprost (XALATAN) 0.005 % ophthalmic solution 1 drop at bedtime.   Yes [provider]  meloxicam (MOBIC) 15 MG tablet Take 1 tablet (15 mg total) by mouth daily. 09/05/21  Yes Wendie Agreste, MD  mirtazapine (REMERON) 7.5 MG tablet Take 1 tablet (7.5 mg total) by mouth at  bedtime as needed. 12/07/19  Yes Jacelyn Pi, Lilia Argue, MD  Omega-3 Fatty Acids (FISH OIL OMEGA-3) 1000 MG CAPS Take by mouth.   Yes [provider]  pantoprazole (PROTONIX) 40 MG tablet Take 1 tablet (40 mg total) by mouth daily. 09/05/21  Yes Wendie Agreste, MD  rosuvastatin (CRESTOR) 10 MG tablet Take 1 tablet (10 mg total) by mouth daily. 09/05/21  Yes Wendie Agreste, MD  spironolactone (ALDACTONE) 50 MG tablet Take 1 tablet (50 mg total) by mouth daily. 09/05/21  Yes Wendie Agreste, MD  valACYclovir (VALTREX) 1000 MG tablet TAKE 2 TABLETS BY MOUTH TWICE A DAY FOR TWO DOSES 08/22/21  Yes Maximiano Coss, NP  VITAMIN D PO Take by mouth.   Yes [provider]   Social History   Socioeconomic History   Marital status: Divorced    Spouse name: Not on file   Number of children: 2   Years of education: Bachelors   Highest education level: Not on file  Occupational History  Occupation: Chemical engineer: [employer]    Comment: also works at [employer]  Tobacco Use   Smoking status: Never   Smokeless tobacco: Never  Vaping Use   Vaping Use: Never used  Substance and Sexual Activity   Alcohol use: No   Drug use: Never   Sexual activity: Yes    Partners: Male    Birth control/protection: Surgical, Post-menopausal  Other Topics Concern   Not on file  Social History Narrative   Marital status: divorced since 1999; dating seriously in 2019 since 2015.      Children: one son, one daughter; 2 grandchildren.      Lives: alone in house with 2 dogs     Employment:  Retired.  Needs to work in 2019.      Tobacco: none      Alcohol: rare      Drugs: none      Exercise:  Sporadic   Right-handed.   Occasional caffeine use.      ADLs: independent with ADLs; drives; no assistant devices.      Advanced Directives: none; FULL CODE.   Social Determinants of Health   Financial Resource Strain: Not on file  Food Insecurity: Not on file  Transportation Needs: Not  on file  Physical Activity: Not on file  Stress: Not on file  Social Connections: Not on file  Intimate Partner Violence: Not on file    Review of Systems   Objective:   Vitals:   10/09/21 0825  BP: 122/64  Pulse: 77  Resp: 16  Temp: 98.7 F (37.1 C)  TempSrc: Temporal  SpO2: 97%  Weight: 144 lb 6.4 oz (65.5 kg)  Height: 5' (1.524 m)     Physical Exam Vitals reviewed.  Constitutional:      Appearance: Normal appearance. She is well-developed.  HENT:     Head: Normocephalic and atraumatic.     Mouth/Throat:     Mouth: Mucous membranes are moist.     Pharynx: No oropharyngeal exudate.     Comments: Speaking without difficulty, clearing secretions without difficulty. Eyes:     Conjunctiva/sclera: Conjunctivae normal.     Pupils: Pupils are equal, round, and reactive to light.  Neck:     Vascular: No carotid bruit.  Cardiovascular:     Rate and Rhythm: Normal rate and regular rhythm.     Heart sounds: Normal heart sounds.  Pulmonary:     Effort: Pulmonary effort is normal.     Breath sounds: Normal breath sounds. No stridor.     Comments: Normal speech.  No stridor. Abdominal:     General: There is no distension.     Palpations: Abdomen is soft. There is no pulsatile mass.     Tenderness: There is no abdominal tenderness. There is no right CVA tenderness or left CVA tenderness.  Musculoskeletal:     Right lower leg: No edema.     Left lower leg: No edema.  Skin:    General: Skin is warm and dry.     Comments: Left thumb, pad with superficial 4 mm wound, absence of superficial skin.  Small area of bleeding with removal of bandage at the distal aspect.  Improves with pressure.  Pressure bandage applied with cap refill intact at tip of thumb.  Neurological:     Mental Status: She is alert and oriented to person, place, and time.  Psychiatric:        Mood and Affect: Mood normal.  Behavior: Behavior normal.     No images are attached to the  encounter.  Results for orders placed or performed in visit on 10/09/21  POCT urinalysis dipstick  Result Value Ref Range   Color, UA straw (A) yellow   Clarity, UA clear clear   Glucose, UA negative negative mg/dL   Bilirubin, UA negative negative   Ketones, POC UA moderate (40) (A) negative mg/dL   Spec Grav, UA 1.025 1.010 - 1.025   Blood, UA trace-intact (A) negative   pH, UA 5.0 5.0 - 8.0   Protein Ur, POC trace (A) negative mg/dL   Urobilinogen, UA 0.2 0.2 or 1.0 E.U./dL   Nitrite, UA Negative Negative   Leukocytes, UA Negative Negative     Assessment & Plan:  MARKELLE ASARO is a 71 y.o. female . Gastroesophageal reflux disease, unspecified whether esophagitis present - Plan: Ambulatory referral to Gastroenterology Hoarse voice quality - Plan: Ambulatory referral to Gastroenterology  - difficult GERD control. Continue PPI, H2 blocker, trigger avoidance, refer to GI, then possible ENT if no source of hoarse voice found at GI.   Open wound of finger, initial encounter  - small wound, min bleeding. pressure dressing applied, home wound care and urgent care precautions given.   Urinary incontinence, unspecified type - Plan: POCT urinalysis dipstick, oxybutynin (DITROPAN XL) 5 MG 24 hr tablet, Ambulatory referral to Urology Enuresis - Plan: Ambulatory referral to Urology  - trial of ditropan, potential side effects discussed, refer to urology.   Meds ordered this encounter  Medications   oxybutynin (DITROPAN XL) 5 MG 24 hr tablet    Sig: Take 1 tablet (5 mg total) by mouth at bedtime.    Dispense:  30 tablet    Refill:  1   Patient Instructions   Continue protonix, pepcid and avoid trigger foods. I will refer you to gastroenterology, but if no cause for sore throat then can refer to ENT if needed.   Keep wound clean, covered and okay to apply pressure with gauze as we did today to help bleeding if needed.  Make sure that is not too much pressure to cut off circulation to  the tip of the thumb.  If continued bleeding, urgent care may be able to treat differently.  Try oxybutynin for the urinary symptoms but I will also refer you to urology.  Recheck 6 weeks, sooner if new or worsening symptoms.    Urinary Incontinence Urinary incontinence refers to a condition in which a person is unable to control where and when to pass urine. A person with this condition will urinate involuntarily. This means that the person urinates when he or she does not mean to. What are the causes? This condition may be caused by: Medicines. Infections. Constipation. Overactive bladder muscles. Weak bladder muscles. Weak pelvic floor muscles. These muscles provide support for the bladder, intestine, and, in women, the uterus. Enlarged prostate in men. The prostate is a gland near the bladder. When it gets too big, it can pinch the urethra. With the urethra blocked, the bladder can weaken and lose the ability to empty properly. Surgery. Emotional factors, such as anxiety, stress, or post-traumatic stress disorder (PTSD). Spinal cord injury, nerve injury, or other neurological conditions. Pelvic organ prolapse. This happens in women when organs move out of place and into the vagina. This movement can prevent the bladder and urethra from working properly. What increases the risk? The following factors may make you more likely to develop this condition: Age.  The older you are, the higher the risk. Obesity. Being physically inactive. Pregnancy and childbirth. Menopause. Diseases that affect the nerves or spinal cord. Long-term, or chronic, coughing. This can increase pressure on the bladder and pelvic floor muscles. What are the signs or symptoms? Symptoms may vary depending on the type of urinary incontinence you have. They include: A sudden urge to urinate, and passing urine involuntarily before you can get to a bathroom (urge incontinence). Suddenly passing urine when doing  activities that force urine to pass, such as coughing, laughing, exercising, or sneezing (stress incontinence). Needing to urinate often but urinating only a small amount, or constantly dribbling urine (overflow incontinence). Urinating because you cannot get to the bathroom in time due to a physical disability, such as arthritis or injury, or due to a communication or thinking problem, such as Alzheimer's disease (functional incontinence). How is this diagnosed? This condition may be diagnosed based on: Your medical history. A physical exam. Tests, such as: Urine tests. X-rays of your kidney and bladder. Ultrasound. CT scan. Cystoscopy. In this procedure, a health care provider inserts a tube with a light and camera (cystoscope) through the urethra and into the bladder to check for problems. Urodynamic testing. These tests assess how well the bladder, urethra, and sphincter can store and release urine. There are different types of urodynamic tests, and they vary depending on what the test is measuring. To help diagnose your condition, your health care provider may recommend that you keep a log of when you urinate and how much you urinate. How is this treated? Treatment for this condition depends on the type of incontinence that you have and its cause. Treatment may include: Lifestyle changes, such as: Quitting smoking. Maintaining a healthy weight. Staying active. Try to get 150 minutes of moderate-intensity exercise every week. Ask your health care provider which activities are safe for you. Eating a healthy diet. Avoid high-fat foods, like fried foods. Avoid refined carbohydrates like white bread and white rice. Limit how much alcohol and caffeine you drink. Increase your fiber intake. Healthy sources of fiber include beans, whole grains, and fresh fruits and vegetables. Behavioral changes, such as: Pelvic floor muscle exercises. Bladder training, such as lengthening the amount of time  between bathroom breaks, or using the bathroom at regular intervals. Using techniques to suppress bladder urges. This can include distraction techniques or controlled breathing exercises. Medicines, such as: Medicines to relax the bladder muscles and prevent bladder spasms. Medicines to help slow or prevent the growth of a man's prostate. Botox injections. These can help relax the bladder muscles. Treatments, such as: Using pulses of electricity to help change bladder reflexes (electrical nerve stimulation). For women, using a medical device to prevent urine leaks. This is a small, tampon-like, disposable device that is inserted into the urethra. Injecting collagen or carbon beads (bulking agents) into the urinary sphincter. These can help thicken tissue and close the bladder opening. Surgery. Follow these instructions at home: Lifestyle Limit alcohol and caffeine. These can fill your bladder quickly and irritate it. Keep yourself clean to help prevent odors and skin damage. Ask your health care provider about special skin creams and cleansers that can protect the skin from urine. Consider wearing pads or adult diapers. Make sure to change them regularly, and always change them right after experiencing incontinence. General instructions Take over-the-counter and prescription medicines only as told by your health care provider. Use the bathroom about every 3-4 hours, even if you do not feel the need  to urinate. Try to empty your bladder completely every time. After urinating, wait a minute. Then try to urinate again. Make sure you are in a relaxed position while urinating. If your incontinence is caused by nerve problems, keep a log of the medicines you take and the times you go to the bathroom. Keep all follow-up visits. This is important. Where to find more information [employer] of Diabetes and Digestive and Kidney Diseases: [employer] American Urology Association:  www.urologyhealth.org Contact a health care provider if: You have pain that gets worse. Your incontinence gets worse. Get help right away if: You have a fever or chills. You are unable to urinate. You have redness in your groin area or down your legs. Summary Urinary incontinence refers to a condition in which a person is unable to control where and when to pass urine. This condition may be caused by medicines, infection, weak bladder muscles, weak pelvic floor muscles, enlargement of the prostate (in men), or surgery. Factors such as older age, obesity, pregnancy and childbirth, menopause, neurological diseases, and chronic coughing may increase your risk for developing this condition. Types of urinary incontinence include urge incontinence, stress incontinence, overflow incontinence, and functional incontinence. This condition is usually treated first with lifestyle and behavioral changes, such as quitting smoking, eating a healthier diet, and doing regular pelvic floor exercises. Other treatment options include medicines, bulking agents, medical devices, electrical nerve stimulation, or surgery. This information is not intended to replace advice given to you by your health care provider. Make sure you discuss any questions you have with your health care provider. Document Revised: 12/01/2019 Document Reviewed: 12/01/2019 Elsevier Patient Education  [place] for Gastroesophageal Reflux Disease, Adult When you have gastroesophageal reflux disease (GERD), the foods you eat and your eating habits are very important. Choosing the right foods can help ease the discomfort of GERD. Consider working with a dietitian to help you make healthy food choices. What are tips for following this plan? Reading food labels Look for foods that are low in saturated fat. Foods that have less than 5% of daily value (DV) of fat and 0 g of trans fats may help with your  symptoms. Cooking Cook foods using methods other than frying. This may include baking, steaming, grilling, or broiling. These are all methods that do not need a lot of fat for cooking. To add flavor, try to use herbs that are low in spice and acidity. Meal planning  Choose healthy foods that are low in fat, such as fruits, vegetables, whole grains, low-fat dairy products, lean meats, fish, and poultry. Eat frequent, small meals instead of three large meals each day. Eat your meals slowly, in a relaxed setting. Avoid bending over or lying down until 2-3 hours after eating. Limit high-fat foods such as fatty meats or fried foods. Limit your intake of fatty foods, such as oils, butter, and shortening. Avoid the following as told by your health care provider: Foods that cause symptoms. These may be different for different people. Keep a food diary to keep track of foods that cause symptoms. Alcohol. Drinking large amounts of liquid with meals. Eating meals during the 2-3 hours before bed. Lifestyle Maintain a healthy weight. Ask your health care provider what weight is healthy for you. If you need to lose weight, work with your health care provider to do so safely. Exercise for at least 30 minutes on 5 or more days each week, or as told by your health  care provider. Avoid wearing clothes that fit tightly around your waist and chest. Do not use any products that contain nicotine or tobacco. These products include cigarettes, chewing tobacco, and vaping devices, such as e-cigarettes. If you need help quitting, ask your health care provider. Sleep with the head of your bed raised. Use a wedge under the mattress or blocks under the bed frame to raise the head of the bed. Chew sugar-free gum after mealtimes. What foods should I eat?  Eat a healthy, well-balanced diet of fruits, vegetables, whole grains, low-fat dairy products, lean meats, fish, and poultry. Each person is different. Foods that may  trigger symptoms in one person may not trigger any symptoms in another person. Work with your health care provider to identify foods that are safe for you. The items listed above may not be a complete list of recommended foods and beverages. Contact a dietitian for more information. What foods should I avoid? Limiting some of these foods may help manage the symptoms of GERD. Everyone is different. Consult a dietitian or your health care provider to help you identify the exact foods to avoid, if any. Fruits Any fruits prepared with added fat. Any fruits that cause symptoms. For some people this may include citrus fruits, such as oranges, grapefruit, pineapple, and lemons. Vegetables Deep-fried vegetables. [place] fries. Any vegetables prepared with added fat. Any vegetables that cause symptoms. For some people, this may include tomatoes and tomato products, chili peppers, onions and garlic, and horseradish. Grains Pastries or quick breads with added fat. Meats and other proteins High-fat meats, such as fatty beef or pork, hot dogs, ribs, ham, sausage, salami, and bacon. Fried meat or protein, including fried fish and fried chicken. Nuts and nut butters, in large amounts. Dairy Whole milk and chocolate milk. Sour cream. Cream. Ice cream. Cream cheese. Milkshakes. Fats and oils Butter. Margarine. Shortening. Ghee. Beverages Coffee and tea, with or without caffeine. Carbonated beverages. Sodas. Energy drinks. Fruit juice made with acidic fruits, such as orange or grapefruit. Tomato juice. Alcoholic drinks. Sweets and desserts Chocolate and cocoa. Donuts. Seasonings and condiments Pepper. Peppermint and spearmint. Added salt. Any condiments, herbs, or seasonings that cause symptoms. For some people, this may include curry, hot sauce, or vinegar-based salad dressings. The items listed above may not be a complete list of foods and beverages to avoid. Contact a dietitian for more information. Questions  to ask your health care provider Diet and lifestyle changes are usually the first steps that are taken to manage symptoms of GERD. If diet and lifestyle changes do not improve your symptoms, talk with your health care provider about taking medicines. Where to find more information International Foundation for Gastrointestinal Disorders: aboutgerd.org Summary When you have gastroesophageal reflux disease (GERD), food and lifestyle choices may be very helpful in easing the discomfort of GERD. Eat frequent, small meals instead of three large meals each day. Eat your meals slowly, in a relaxed setting. Avoid bending over or lying down until 2-3 hours after eating. Limit high-fat foods such as fatty meats or fried foods. This information is not intended to replace advice given to you by your health care provider. Make sure you discuss any questions you have with your health care provider. Document Revised: 11/06/2019 Document Reviewed: 11/06/2019 Elsevier Patient Education  [place],   Merri Ray, MD [place], [facility] 10/09/21 9:14 AM

## 2021-10-09 NOTE — Patient Instructions (Addendum)
Continue protonix, pepcid and avoid trigger foods. I will refer you to gastroenterology, but if no cause for sore throat then can refer to ENT if needed.   Keep wound clean, covered and okay to apply pressure with gauze as we did today to help bleeding if needed.  Make sure that is not too much pressure to cut off circulation to the tip of the thumb.  If continued bleeding, urgent care may be able to treat differently.  Try oxybutynin for the urinary symptoms but I will also refer you to urology.  Recheck 6 weeks, sooner if new or worsening symptoms.  Difficulty GERD treatment, intermittent hoarse voice likely related to GERD.  Refer to GI, consider ENT eval if needed cause for hoarseness.  Difficult to control GERD, continue same regimen for now including trigger avoidance, refer to GI to discuss continue PPI, H2 blocker at this time with trigger avoidance.  Continue side effects discussed.  Side effects discussed    Urinary Incontinence Urinary incontinence refers to a condition in which a person is unable to control where and when to pass urine. A person with this condition will urinate involuntarily. This means that the person urinates when he or she does not mean to. What are the causes? This condition may be caused by: Medicines. Infections. Constipation. Overactive bladder muscles. Weak bladder muscles. Weak pelvic floor muscles. These muscles provide support for the bladder, intestine, and, in women, the uterus. Enlarged prostate in men. The prostate is a gland near the bladder. When it gets too big, it can pinch the urethra. With the urethra blocked, the bladder can weaken and lose the ability to empty properly. Surgery. Emotional factors, such as anxiety, stress, or post-traumatic stress disorder (PTSD). Spinal cord injury, nerve injury, or other neurological conditions. Pelvic organ prolapse. This happens in women when organs move out of place and into the vagina. This movement can  prevent the bladder and urethra from working properly. What increases the risk? The following factors may make you more likely to develop this condition: Age. The older you are, the higher the risk. Obesity. Being physically inactive. Pregnancy and childbirth. Menopause. Diseases that affect the nerves or spinal cord. Long-term, or chronic, coughing. This can increase pressure on the bladder and pelvic floor muscles. What are the signs or symptoms? Symptoms may vary depending on the type of urinary incontinence you have. They include: A sudden urge to urinate, and passing urine involuntarily before you can get to a bathroom (urge incontinence). Suddenly passing urine when doing activities that force urine to pass, such as coughing, laughing, exercising, or sneezing (stress incontinence). Needing to urinate often but urinating only a small amount, or constantly dribbling urine (overflow incontinence). Urinating because you cannot get to the bathroom in time due to a physical disability, such as arthritis or injury, or due to a communication or thinking problem, such as Alzheimer's disease (functional incontinence). How is this diagnosed? This condition may be diagnosed based on: Your medical history. A physical exam. Tests, such as: Urine tests. X-rays of your kidney and bladder. Ultrasound. CT scan. Cystoscopy. In this procedure, a health care provider inserts a tube with a light and camera (cystoscope) through the urethra and into the bladder to check for problems. Urodynamic testing. These tests assess how well the bladder, urethra, and sphincter can store and release urine. There are different types of urodynamic tests, and they vary depending on what the test is measuring. To help diagnose your condition, your health care  provider may recommend that you keep a log of when you urinate and how much you urinate. How is this treated? Treatment for this condition depends on the type of  incontinence that you have and its cause. Treatment may include: Lifestyle changes, such as: Quitting smoking. Maintaining a healthy weight. Staying active. Try to get 150 minutes of moderate-intensity exercise every week. Ask your health care provider which activities are safe for you. Eating a healthy diet. Avoid high-fat foods, like fried foods. Avoid refined carbohydrates like white bread and white rice. Limit how much alcohol and caffeine you drink. Increase your fiber intake. Healthy sources of fiber include beans, whole grains, and fresh fruits and vegetables. Behavioral changes, such as: Pelvic floor muscle exercises. Bladder training, such as lengthening the amount of time between bathroom breaks, or using the bathroom at regular intervals. Using techniques to suppress bladder urges. This can include distraction techniques or controlled breathing exercises. Medicines, such as: Medicines to relax the bladder muscles and prevent bladder spasms. Medicines to help slow or prevent the growth of a man's prostate. Botox injections. These can help relax the bladder muscles. Treatments, such as: Using pulses of electricity to help change bladder reflexes (electrical nerve stimulation). For women, using a medical device to prevent urine leaks. This is a small, tampon-like, disposable device that is inserted into the urethra. Injecting collagen or carbon beads (bulking agents) into the urinary sphincter. These can help thicken tissue and close the bladder opening. Surgery. Follow these instructions at home: Lifestyle Limit alcohol and caffeine. These can fill your bladder quickly and irritate it. Keep yourself clean to help prevent odors and skin damage. Ask your health care provider about special skin creams and cleansers that can protect the skin from urine. Consider wearing pads or adult diapers. Make sure to change them regularly, and always change them right after experiencing  incontinence. General instructions Take over-the-counter and prescription medicines only as told by your health care provider. Use the bathroom about every 3-4 hours, even if you do not feel the need to urinate. Try to empty your bladder completely every time. After urinating, wait a minute. Then try to urinate again. Make sure you are in a relaxed position while urinating. If your incontinence is caused by nerve problems, keep a log of the medicines you take and the times you go to the bathroom. Keep all follow-up visits. This is important. Where to find more information [employer] and Digestive and Kidney Diseases: [employer] U[employer] Association: www.urologyhealth.org Contact a health care provider if: You have pain that gets worse. Your incontinence gets worse. Get help right away if: You have a fever or chills. You are unable to urinate. You have redness in your groin area or down your legs. Summary Urinary incontinence refers to a condition in which a person is unable to control where and when to pass urine. This condition may be caused by medicines, infection, weak bladder muscles, weak pelvic floor muscles, enlargement of the prostate (in men), or surgery. Factors such as older age, obesity, pregnancy and childbirth, menopause, neurological diseases, and chronic coughing may increase your risk for developing this condition. Types of urinary incontinence include urge incontinence, stress incontinence, overflow incontinence, and functional incontinence. This condition is usually treated first with lifestyle and behavioral changes, such as quitting smoking, eating a healthier diet, and doing regular pelvic floor exercises. Other treatment options include medicines, bulking agents, medical devices, electrical nerve stimulation, or surgery. This information is not intended  to replace advice given to you by your health care provider. Make sure you discuss  any questions you have with your health care provider. Document Revised: 12/01/2019 Document Reviewed: 12/01/2019 Elsevier Patient Education  [place] for Gastroesophageal Reflux Disease, Adult When you have gastroesophageal reflux disease (GERD), the foods you eat and your eating habits are very important. Choosing the right foods can help ease the discomfort of GERD. Consider working with a dietitian to help you make healthy food choices. What are tips for following this plan? Reading food labels Look for foods that are low in saturated fat. Foods that have less than 5% of daily value (DV) of fat and 0 g of trans fats may help with your symptoms. Cooking Cook foods using methods other than frying. This may include baking, steaming, grilling, or broiling. These are all methods that do not need a lot of fat for cooking. To add flavor, try to use herbs that are low in spice and acidity. Meal planning  Choose healthy foods that are low in fat, such as fruits, vegetables, whole grains, low-fat dairy products, lean meats, fish, and poultry. Eat frequent, small meals instead of three large meals each day. Eat your meals slowly, in a relaxed setting. Avoid bending over or lying down until 2-3 hours after eating. Limit high-fat foods such as fatty meats or fried foods. Limit your intake of fatty foods, such as oils, butter, and shortening. Avoid the following as told by your health care provider: Foods that cause symptoms. These may be different for different people. Keep a food diary to keep track of foods that cause symptoms. Alcohol. Drinking large amounts of liquid with meals. Eating meals during the 2-3 hours before bed. Lifestyle Maintain a healthy weight. Ask your health care provider what weight is healthy for you. If you need to lose weight, work with your health care provider to do so safely. Exercise for at least 30 minutes on 5 or more days each week, or as  told by your health care provider. Avoid wearing clothes that fit tightly around your waist and chest. Do not use any products that contain nicotine or tobacco. These products include cigarettes, chewing tobacco, and vaping devices, such as e-cigarettes. If you need help quitting, ask your health care provider. Sleep with the head of your bed raised. Use a wedge under the mattress or blocks under the bed frame to raise the head of the bed. Chew sugar-free gum after mealtimes. What foods should I eat?  Eat a healthy, well-balanced diet of fruits, vegetables, whole grains, low-fat dairy products, lean meats, fish, and poultry. Each person is different. Foods that may trigger symptoms in one person may not trigger any symptoms in another person. Work with your health care provider to identify foods that are safe for you. The items listed above may not be a complete list of recommended foods and beverages. Contact a dietitian for more information. What foods should I avoid? Limiting some of these foods may help manage the symptoms of GERD. Everyone is different. Consult a dietitian or your health care provider to help you identify the exact foods to avoid, if any. Fruits Any fruits prepared with added fat. Any fruits that cause symptoms. For some people this may include citrus fruits, such as oranges, grapefruit, pineapple, and lemons. Vegetables Deep-fried vegetables. [place] fries. Any vegetables prepared with added fat. Any vegetables that cause symptoms. For some people, this may include tomatoes and tomato products,  chili peppers, onions and garlic, and horseradish. Grains Pastries or quick breads with added fat. Meats and other proteins High-fat meats, such as fatty beef or pork, hot dogs, ribs, ham, sausage, salami, and bacon. Fried meat or protein, including fried fish and fried chicken. Nuts and nut butters, in large amounts. Dairy Whole milk and chocolate milk. Sour cream. Cream. Ice cream.  Cream cheese. Milkshakes. Fats and oils Butter. Margarine. Shortening. Ghee. Beverages Coffee and tea, with or without caffeine. Carbonated beverages. Sodas. Energy drinks. Fruit juice made with acidic fruits, such as orange or grapefruit. Tomato juice. Alcoholic drinks. Sweets and desserts Chocolate and cocoa. Donuts. Seasonings and condiments Pepper. Peppermint and spearmint. Added salt. Any condiments, herbs, or seasonings that cause symptoms. For some people, this may include curry, hot sauce, or vinegar-based salad dressings. The items listed above may not be a complete list of foods and beverages to avoid. Contact a dietitian for more information. Questions to ask your health care provider Diet and lifestyle changes are usually the first steps that are taken to manage symptoms of GERD. If diet and lifestyle changes do not improve your symptoms, talk with your health care provider about taking medicines. Where to find more information [employer] for Gastrointestinal Disorders: aboutgerd.org Summary When you have gastroesophageal reflux disease (GERD), food and lifestyle choices may be very helpful in easing the discomfort of GERD. Eat frequent, small meals instead of three large meals each day. Eat your meals slowly, in a relaxed setting. Avoid bending over or lying down until 2-3 hours after eating. Limit high-fat foods such as fatty meats or fried foods. This information is not intended to replace advice given to you by your health care provider. Make sure you discuss any questions you have with your health care provider. Document Revised: 11/06/2019 Document Reviewed: 11/06/2019 Elsevier Patient Education  [place].

## 2021-10-11 ENCOUNTER — Other Ambulatory Visit: Payer: Self-pay | Admitting: Family Medicine

## 2021-10-11 DIAGNOSIS — G8929 Other chronic pain: Secondary | ICD-10-CM

## 2021-10-13 NOTE — Telephone Encounter (Signed)
Patient is requesting a refill of the following medications: Requested Prescriptions   Pending Prescriptions Disp Refills   meloxicam (MOBIC) 15 MG tablet [Pharmacy Med Name: MELOXICAM 15 MG TAB] 30 tablet 0    Sig: TAKE 1 TABLET BY MOUTH DAILY    Date of patient request: 10/11/21 Last office visit: 10/09/21 Date of last refill: 09/05/21 Last refill amount: 30

## 2021-10-14 ENCOUNTER — Telehealth: Payer: Self-pay | Admitting: Family Medicine

## 2021-10-14 NOTE — Telephone Encounter (Signed)
Discussed in April, refill ordered.

## 2021-10-14 NOTE — Telephone Encounter (Signed)
Left message for patient to call back and schedule Medicare Annual Wellness Visit (AWV).   Please offer to do virtually or by telephone.  Left office number and my jabber #[phone].  Last AWV:10/26/2019  Please schedule at anytime with Nurse Health Advisor.   

## 2021-10-27 DIAGNOSIS — Z961 Presence of intraocular lens: Secondary | ICD-10-CM | POA: Diagnosis not present

## 2021-10-27 DIAGNOSIS — H401133 Primary open-angle glaucoma, bilateral, severe stage: Secondary | ICD-10-CM | POA: Diagnosis not present

## 2021-11-21 ENCOUNTER — Ambulatory Visit: Payer: PPO | Admitting: Family Medicine

## 2021-11-28 ENCOUNTER — Other Ambulatory Visit: Payer: Self-pay | Admitting: Family Medicine

## 2021-11-28 DIAGNOSIS — G8929 Other chronic pain: Secondary | ICD-10-CM

## 2021-12-01 DIAGNOSIS — M2241 Chondromalacia patellae, right knee: Secondary | ICD-10-CM | POA: Diagnosis not present

## 2021-12-01 DIAGNOSIS — M1611 Unilateral primary osteoarthritis, right hip: Secondary | ICD-10-CM | POA: Diagnosis not present

## 2021-12-02 DIAGNOSIS — D485 Neoplasm of uncertain behavior of skin: Secondary | ICD-10-CM | POA: Diagnosis not present

## 2021-12-02 DIAGNOSIS — C44622 Squamous cell carcinoma of skin of right upper limb, including shoulder: Secondary | ICD-10-CM | POA: Diagnosis not present

## 2021-12-05 ENCOUNTER — Ambulatory Visit: Payer: PPO | Admitting: Family Medicine

## 2021-12-05 ENCOUNTER — Encounter: Payer: Self-pay | Admitting: Family Medicine

## 2021-12-05 ENCOUNTER — Ambulatory Visit (INDEPENDENT_AMBULATORY_CARE_PROVIDER_SITE_OTHER): Payer: PPO | Admitting: Family Medicine

## 2021-12-05 VITALS — BP 124/86 | HR 61 | Temp 98.2°F | Resp 19 | Ht 60.0 in | Wt 141.8 lb

## 2021-12-05 DIAGNOSIS — R32 Unspecified urinary incontinence: Secondary | ICD-10-CM

## 2021-12-05 DIAGNOSIS — F419 Anxiety disorder, unspecified: Secondary | ICD-10-CM | POA: Diagnosis not present

## 2021-12-05 DIAGNOSIS — R490 Dysphonia: Secondary | ICD-10-CM | POA: Diagnosis not present

## 2021-12-05 DIAGNOSIS — K219 Gastro-esophageal reflux disease without esophagitis: Secondary | ICD-10-CM

## 2021-12-05 DIAGNOSIS — F32A Depression, unspecified: Secondary | ICD-10-CM | POA: Diagnosis not present

## 2021-12-05 MED ORDER — MIRABEGRON ER 25 MG PO TB24: 25.0000 mg | ORAL_TABLET | Freq: Every day | ORAL | 0 refills | Status: DC

## 2021-12-05 MED ORDER — FLUOXETINE HCL 20 MG PO CAPS: 60.0000 mg | ORAL_CAPSULE | Freq: Every day | ORAL | 1 refills | Status: DC

## 2021-12-05 NOTE — Progress Notes (Signed)
Subjective:  Patient ID: Maria Sweeney, female    DOB: [birth year]  Age: 71 y.o. MRN: [id number]  CC:  Chief Complaint  Patient presents with   Knee Pain    Pt states went to ortho states ortho says she may have pulled a muscle pt states she bought a brace and it has been helping along with the meloxcam also states supposed to start therapy on knee    Urinary Incontinence   Gastroesophageal Reflux         HPI Maria Sweeney presents for  Follow-up from June visit.  Urinary incontinence Discussed last visit, trial of Ditropan XL 5 mg daily.  Referred to urology. Only took few times, caused dry eyes. No recent enuresis. Still some leakage.   GERD With hoarse voice, GERD has been treatment resistant, was continued on PPI, H2 blocker and trigger avoidance last visit with referral to gastroenterology, option to meet with ENT if no source for hoarse voice. Same sx's of reflux - sour burps, heartburn. Hoarse voice. No unexplained weight loss, fevers or new night sweats. On protonix '40mg'$  QD, pepcid at night. No abd pain, melena, hematochezia.   Recently followed by Ortho for knee pain, possible pulled muscle, treated with meloxicam and plan for physical therapy. Getting better and wearing brace.   Depression: Overall feels leveled out.  Some immediate recall difficulty, then immediately remembers. No other memory changes. Some increased stressors past few months. Tried to sell father's house.  No new side effects of meds. Prozac '60mg'$  qd.     12/05/2021   10:06 AM 09/05/2021   11:07 AM 08/11/2021   11:16 AM 10/17/2020    4:43 PM 06/13/2020   10:59 AM  Depression screen PHQ 2/9  Decreased Interest 0 '1 1 1 '$ 0  Down, Depressed, Hopeless 0 1 1 0 0  PHQ - 2 Score 0 '2 2 1 '$ 0  Altered sleeping 1 0 3 0   Tired, decreased energy '1 1 1 1   '$ Change in appetite 0 0 0 0   Feeling bad or failure about yourself  0 0 0 0   Trouble concentrating 0 1 0 0   Moving slowly or fidgety/restless 0 0 0 0    Suicidal thoughts 0 0 0 0   PHQ-9 Score '2 4 6 2   '$ Difficult doing work/chores Not difficult at all  Not difficult at all       History Patient Active Problem List   Diagnosis Date Noted   Encounter for screening colonoscopy    Vitamin D deficiency 08/02/2018   Prediabetes 08/02/2018   Carpal tunnel syndrome 02/10/2018   Tinnitus 06/25/2016   Numbness 04/13/2016   Loss of smell 04/13/2016   Pure hypercholesterolemia 11/30/2014   Degenerative disc disease, cervical 04/24/2014   Essential hypertension, benign 10/19/2013   Insomnia 10/19/2013   Herpes labialis 10/19/2013   Anxiety and depression 07/25/2012   GERD (gastroesophageal reflux disease) 07/25/2012   Past Medical History:  Diagnosis Date   Anxiety    Arthritis    Phreesia 04/12/2020   Asthma    Cataract    Phreesia 10/25/2019   Cervical spondylosis    Complication of anesthesia    with colonoscopy woke up twice   Depression    Depression    Phreesia 04/12/2020   GERD (gastroesophageal reflux disease)    Glaucoma    Phreesia 10/25/2019   Hyperlipidemia    Hypertension    IBS (irritable bowel syndrome)  Constipation with diarrhea.   Oral herpes    Past Surgical History:  Procedure Laterality Date   ABDOMINAL HYSTERECTOMY  1978   DUB; ovaries intact.   APPENDECTOMY  1988   BREAST BIOPSY Bilateral 2009   Calcium Deposit- 1980's and also in 2009/ Right side 1995   BREAST EXCISIONAL BIOPSY Bilateral    BREAST SURGERY N/A    Phreesia 10/25/2019   CATARACT EXTRACTION W/PHACO Left 10/23/2020   Procedure: CATARACT EXTRACTION PHACO AND INTRAOCULAR LENS PLACEMENT (IOC) LEFT TORIC LENS KAHOOK DUAL BLADE GONIOTOMY,  VISION BLUE 7.69 00:53.0;  Surgeon: Leandrew Koyanagi, MD;  Location: [facility];  Service: Ophthalmology;  Laterality: Left;   CATARACT EXTRACTION W/PHACO Right 11/20/2020   Procedure: CATARACT EXTRACTION PHACO AND INTRAOCULAR LENS PLACEMENT ([place]) RIGHT LENS KAHOOK DUAL BLADE GONIOTOMY  VISION BLUE 8.58 01:24.9;  Surgeon: Leandrew Koyanagi, MD;  Location: [facility];  Service: Ophthalmology;  Laterality: Right;  latex   COLONOSCOPY  05/11/2009   Normal.  Previous colonoscopy with polyps. [facility].     COLONOSCOPY WITH PROPOFOL N/A 12/20/2019   Procedure: COLONOSCOPY WITH PROPOFOL;  Surgeon: Lin Landsman, MD;  Location: [facility] ENDOSCOPY;  Service: Gastroenterology;  Laterality: N/A;   DEBRIDEMENT TENNIS ELBOW     ROTATOR CUFF REPAIR     TONSILLECTOMY     Allergies  Allergen Reactions   Amoxicillin Anaphylaxis   Augmentin [Amoxicillin-Pot Clavulanate]    Codeine Itching   Depakote [Divalproex Sodium]    Latex Hives   Lipitor [Atorvastatin]    Penicillins    Zocor [Simvastatin]    Septra [Sulfamethoxazole-Trimethoprim] Rash   Prior to Admission medications   Medication Sig Start Date End Date Taking? Authorizing Provider  albuterol (PROVENTIL HFA;VENTOLIN HFA) 108 (90 Base) MCG/ACT inhaler Inhale 2 puffs into the lungs every 4 (four) hours as needed for wheezing or shortness of breath (cough, shortness of breath or wheezing.). 02/11/16  Yes Wardell Honour, MD  dorzolamide-timolol (COSOPT) 22.3-6.8 MG/ML ophthalmic solution  12/25/19  Yes [provider]  famotidine (PEPCID) 40 MG tablet Take 1 tablet (40 mg total) by mouth daily. 09/05/21  Yes Wendie Agreste, MD  FLUoxetine (PROZAC) 20 MG capsule Take 3 capsules (60 mg total) by mouth daily. 09/05/21  Yes Wendie Agreste, MD  hydrALAZINE (APRESOLINE) 25 MG tablet Take 1 tablet (25 mg total) by mouth 3 (three) times daily as needed. 06/19/20  Yes Gollan, Kathlene November, MD  latanoprost (XALATAN) 0.005 % ophthalmic solution 1 drop at bedtime.   Yes [provider]  meloxicam (MOBIC) 15 MG tablet TAKE 1 TABLET BY MOUTH DAILY 11/28/21  Yes Wendie Agreste, MD  mirtazapine (REMERON) 7.5 MG tablet Take 1 tablet (7.5 mg total) by mouth at bedtime as needed. 12/07/19  Yes Jacelyn Pi, Lilia Argue, MD   Omega-3 Fatty Acids (FISH OIL OMEGA-3) 1000 MG CAPS Take by mouth.   Yes [provider]  pantoprazole (PROTONIX) 40 MG tablet Take 1 tablet (40 mg total) by mouth daily. 09/05/21  Yes Wendie Agreste, MD  rosuvastatin (CRESTOR) 10 MG tablet Take 1 tablet (10 mg total) by mouth daily. 09/05/21  Yes Wendie Agreste, MD  spironolactone (ALDACTONE) 50 MG tablet Take 1 tablet (50 mg total) by mouth daily. 09/05/21  Yes Wendie Agreste, MD  valACYclovir (VALTREX) 1000 MG tablet TAKE 2 TABLETS BY MOUTH TWICE A DAY FOR TWO DOSES 08/22/21  Yes Maximiano Coss, NP  VITAMIN D PO Take by mouth.   Yes [provider]  oxybutynin (DITROPAN XL) 5 MG 24 hr tablet Take 1 tablet (5 mg total) by mouth at bedtime. Patient not taking: Reported on 12/05/2021 10/09/21   Wendie Agreste, MD   Social History   Socioeconomic History   Marital status: Divorced    Spouse name: Not on file   Number of children: 2   Years of education: Bachelors   Highest education level: Not on file  Occupational History   Occupation: Press photographer ASSOCIATE    Employer: [employer]    Comment: also works at [employer]  Tobacco Use   Smoking status: Never   Smokeless tobacco: Never  Vaping Use   Vaping Use: Never used  Substance and Sexual Activity   Alcohol use: No   Drug use: Never   Sexual activity: Yes    Partners: Male    Birth control/protection: Surgical, Post-menopausal  Other Topics Concern   Not on file  Social History Narrative   Marital status: divorced since 1999; dating seriously in 2019 since 2015.      Children: one son, one daughter; 2 grandchildren.      Lives: alone in house with 2 dogs     Employment:  Retired.  Needs to work in 2019.      Tobacco: none      Alcohol: rare      Drugs: none      Exercise:  Sporadic   Right-handed.   Occasional caffeine use.      ADLs: independent with ADLs; drives; no assistant devices.      Advanced Directives: none; FULL CODE.   Social Determinants of  Health   Financial Resource Strain: Low Risk  (06/18/2017)   Overall Financial Resource Strain (CARDIA)    Difficulty of Paying Living Expenses: Not hard at all  Food Insecurity: No Food Insecurity (06/18/2017)   Hunger Vital Sign    Worried About Running Out of Food in the Last Year: Never true    Ran Out of Food in the Last Year: Never true  Transportation Needs: No Transportation Needs (06/18/2017)   PRAPARE - Hydrologist (Medical): No    Lack of Transportation (Non-Medical): No  Physical Activity: Inactive (06/18/2017)   Exercise Vital Sign    Days of Exercise per Week: 0 days    Minutes of Exercise per Session: 0 min  Stress: Stress Concern Present (06/18/2017)   Coin    Feeling of Stress : To some extent  Social Connections: Moderately Isolated (06/18/2017)   Social Connection and Isolation Panel [NHANES]    Frequency of Communication with Friends and Family: Never    Frequency of Social Gatherings with Friends and Family: Never    Attends Religious Services: More than 4 times per year    Active Member of [employer] or Organizations: No    Attends Archivist Meetings: Never    Marital Status: Divorced  Human resources officer Violence: Not At Risk (06/18/2017)   Humiliation, Afraid, Rape, and Kick questionnaire    Fear of Current or Ex-Partner: No    Emotionally Abused: No    Physically Abused: No    Sexually Abused: No    Review of Systems Per HPI.  Objective:   Vitals:   12/05/21 1007  BP: 124/86  Pulse: 61  Resp: 19  Temp: 98.2 F (36.8 C)  SpO2: 96%  Weight: 141 lb 12.8 oz (64.3 kg)  Height: 5' (1.524 m)  Physical Exam Vitals reviewed.  Constitutional:      Appearance: Normal appearance. She is well-developed.  HENT:     Head: Normocephalic and atraumatic.  Eyes:     Conjunctiva/sclera: Conjunctivae normal.     Pupils: Pupils are equal, round, and reactive to  light.  Neck:     Vascular: No carotid bruit.  Cardiovascular:     Rate and Rhythm: Normal rate and regular rhythm.     Heart sounds: Normal heart sounds.  Pulmonary:     Effort: Pulmonary effort is normal.     Breath sounds: Normal breath sounds.  Abdominal:     General: There is no distension.     Palpations: Abdomen is soft. There is no pulsatile mass.     Tenderness: There is no abdominal tenderness. There is no guarding.  Musculoskeletal:     Right lower leg: No edema.     Left lower leg: No edema.  Skin:    General: Skin is warm and dry.  Neurological:     Mental Status: She is alert and oriented to person, place, and time.  Psychiatric:        Mood and Affect: Mood normal.        Behavior: Behavior normal.        Assessment & Plan:  Maria Sweeney is a 71 y.o. female . Gastroesophageal reflux disease, unspecified whether esophagitis present Hoarse voice quality   -No new symptoms, still persistent reflux and hoarse voice at times.  Gastroenterology follow-up planned but for now we will try twice daily dosing PPI, continue H2 blocker.  RTC precautions if new or worsening symptoms.  Urinary incontinence, unspecified type - Plan: mirabegron ER (MYRBETRIQ) 25 MG TB24 tablet Enuresis  -Did not tolerate Ditropan, trial of Myrbetriq.  Referred to urology last visit, advised to call to schedule appointment.  Denies recent enuresis.  Anxiety and depression - Plan: FLUoxetine (PROZAC) 20 MG capsule Overall stable.  Discussed concerns regarding dementia and SSRIs.  Current symptoms of delayed immediate recall likely related to aging, not dementia and no other concerning memory symptoms.  May also have component of situational stressors impacting that recall.  Option of lower dosing of fluoxetine if her anxiety/depression symptoms are well controlled or can remain at current dosing of 60 mg daily with RTC precautions given.  Knee pain is improving, continue follow-up with  Ortho.  Meds ordered this encounter  Medications   FLUoxetine (PROZAC) 20 MG capsule    Sig: Take 3 capsules (60 mg total) by mouth daily.    Dispense:  270 capsule    Refill:  1   mirabegron ER (MYRBETRIQ) 25 MG TB24 tablet    Sig: Take 1 tablet (25 mg total) by mouth daily.    Dispense:  30 tablet    Refill:  0   Patient Instructions  Stop ditropan, call urology to schedule appointment, but we can try different med to see if better tolerated. Myrbetriq once per day. If that is too costly let me know  Continue pepcid, increase protonix to twice per day - 44mn before meals. Keep follow up with gastroenterology as planned but let me know if any worsening symptoms.   Difficulty with immediate recall may be normal part of aging as well as in response to recent stressors.  Okay to remain on same dose of fluoxetine.  If you feel like symptoms are well controlled and you want to try to take 2/day instead of 3/day, I think that is  reasonable temporarily and let me know if that dose is still effective.  If any new or worsening symptoms on 2 pills, then return to 3/day.  If any new or worsening memory symptoms let me know right away and we can look into that further.  Take care.          Signed,   Merri Ray, MD [place], [facility] 12/05/21 11:59 AM

## 2021-12-05 NOTE — Patient Instructions (Addendum)
Stop ditropan, call urology to schedule appointment, but we can try different med to see if better tolerated. Myrbetriq once per day. If that is too costly let me know  Continue pepcid, increase protonix to twice per day - 99mn before meals. Keep follow up with gastroenterology as planned but let me know if any worsening symptoms.   Difficulty with immediate recall may be normal part of aging as well as in response to recent stressors.  Okay to remain on same dose of fluoxetine.  If you feel like symptoms are well controlled and you want to try to take 2/day instead of 3/day, I think that is reasonable temporarily and let me know if that dose is still effective.  If any new or worsening symptoms on 2 pills, then return to 3/day.  If any new or worsening memory symptoms let me know right away and we can look into that further.  Take care.

## 2021-12-15 DIAGNOSIS — M25561 Pain in right knee: Secondary | ICD-10-CM | POA: Diagnosis not present

## 2021-12-15 DIAGNOSIS — M25551 Pain in right hip: Secondary | ICD-10-CM | POA: Diagnosis not present

## 2021-12-19 ENCOUNTER — Telehealth: Payer: Self-pay | Admitting: Family Medicine

## 2021-12-19 NOTE — Telephone Encounter (Signed)
Left message for patient to call back and schedule Medicare Annual Wellness Visit (AWV).   Please offer to do virtually or by telephone.  Left office number and my jabber [phone].  Last AWV:10/26/2019  Please schedule at anytime with Nurse Health Advisor.

## 2022-01-03 ENCOUNTER — Other Ambulatory Visit: Payer: Self-pay | Admitting: Family Medicine

## 2022-01-03 DIAGNOSIS — M25361 Other instability, right knee: Secondary | ICD-10-CM | POA: Diagnosis not present

## 2022-01-03 DIAGNOSIS — K219 Gastro-esophageal reflux disease without esophagitis: Secondary | ICD-10-CM

## 2022-01-09 DIAGNOSIS — M1611 Unilateral primary osteoarthritis, right hip: Secondary | ICD-10-CM | POA: Diagnosis not present

## 2022-01-09 DIAGNOSIS — M5412 Radiculopathy, cervical region: Secondary | ICD-10-CM | POA: Diagnosis not present

## 2022-01-09 DIAGNOSIS — M1711 Unilateral primary osteoarthritis, right knee: Secondary | ICD-10-CM | POA: Diagnosis not present

## 2022-01-13 NOTE — Progress Notes (Unsigned)
Cardiology Office Note  Date:  01/14/2022   ID:  Maria Sweeney, DOB [birth year], MRN [id number]  PCP:  Wendie Agreste, MD   Chief Complaint  Patient presents with   12 month follow up     "Doing well." Medications reviewed by the patient verbally.     HPI:  Maria Sweeney is a 71 yo woman with PMH of HTN  Hyperlipidemia,  intolerance to simvastatin/liptor Anxiety/depression Who presents for follow-up of her  progressive DOE, HTN  Last seen in clinic 2/22 In follow-up today reports she is doing relatively well Has been exercising less secondary to her arthritides but trying to stay active Blood pressure well controlled  Non-smoker Tolerating Crestor  Lab work reviewed,  A1c 6.7 Total cholesterol 178, triglycerides 304, LDL 99  EKG personally reviewed by myself on todays visit Sinus bradycardia rate 57 bpm no significant ST-T wave changes  Previously an accountant  family history Mother: smoker, CAD, MI   PMH:   has a past medical history of Anxiety, Arthritis, Asthma, Cataract, Cervical spondylosis, Complication of anesthesia, Depression, Depression, GERD (gastroesophageal reflux disease), Glaucoma, Hyperlipidemia, Hypertension, IBS (irritable bowel syndrome), and Oral herpes.  PSH:    Past Surgical History:  Procedure Laterality Date   ABDOMINAL HYSTERECTOMY  1978   DUB; ovaries intact.   APPENDECTOMY  1988   BREAST BIOPSY Bilateral 2009   Calcium Deposit- 1980's and also in 2009/ Right side 1995   BREAST EXCISIONAL BIOPSY Bilateral    BREAST SURGERY N/A    Phreesia 10/25/2019   CATARACT EXTRACTION W/PHACO Left 10/23/2020   Procedure: CATARACT EXTRACTION PHACO AND INTRAOCULAR LENS PLACEMENT (IOC) LEFT TORIC LENS KAHOOK DUAL BLADE GONIOTOMY,  VISION BLUE 7.69 00:53.0;  Surgeon: Leandrew Koyanagi, MD;  Location: [place];  Service: Ophthalmology;  Laterality: Left;   CATARACT EXTRACTION W/PHACO Right 11/20/2020   Procedure: CATARACT EXTRACTION  PHACO AND INTRAOCULAR LENS PLACEMENT ([place]) RIGHT LENS KAHOOK DUAL BLADE GONIOTOMY VISION BLUE 8.58 01:24.9;  Surgeon: Leandrew Koyanagi, MD;  Location: [place];  Service: Ophthalmology;  Laterality: Right;  latex   COLONOSCOPY  05/11/2009   Normal.  Previous colonoscopy with polyps. [facility].     COLONOSCOPY WITH PROPOFOL N/A 12/20/2019   Procedure: COLONOSCOPY WITH PROPOFOL;  Surgeon: Lin Landsman, MD;  Location: [facility] ENDOSCOPY;  Service: Gastroenterology;  Laterality: N/A;   DEBRIDEMENT TENNIS ELBOW     ROTATOR CUFF REPAIR     TONSILLECTOMY      Current Outpatient Medications  Medication Sig Dispense Refill   albuterol (PROVENTIL HFA;VENTOLIN HFA) 108 (90 Base) MCG/ACT inhaler Inhale 2 puffs into the lungs every 4 (four) hours as needed for wheezing or shortness of breath (cough, shortness of breath or wheezing.). 1 Inhaler 1   dorzolamide-timolol (COSOPT) 22.3-6.8 MG/ML ophthalmic solution      famotidine (PEPCID) 40 MG tablet TAKE 1 TABLET BY MOUTH DAILY 90 tablet 0   FLUoxetine (PROZAC) 20 MG capsule Take 3 capsules (60 mg total) by mouth daily. 270 capsule 1   hydrALAZINE (APRESOLINE) 25 MG tablet Take 1 tablet (25 mg total) by mouth 3 (three) times daily as needed. 90 tablet 1   latanoprost (XALATAN) 0.005 % ophthalmic solution 1 drop at bedtime.     meloxicam (MOBIC) 15 MG tablet TAKE 1 TABLET BY MOUTH DAILY 30 tablet 0   methylPREDNISolone (MEDROL DOSEPAK) 4 MG TBPK tablet Take 4 mg by mouth.     mirabegron ER (MYRBETRIQ) 25 MG TB24 tablet Take 1 tablet (25  mg total) by mouth daily. 30 tablet 0   mirtazapine (REMERON) 7.5 MG tablet Take 1 tablet (7.5 mg total) by mouth at bedtime as needed. 30 tablet 2   Omega-3 Fatty Acids (FISH OIL OMEGA-3) 1000 MG CAPS Take by mouth.     pantoprazole (PROTONIX) 40 MG tablet Take 1 tablet (40 mg total) by mouth daily. 90 tablet 3   rosuvastatin (CRESTOR) 10 MG tablet Take 1 tablet (10 mg total) by mouth daily. 90 tablet 3    spironolactone (ALDACTONE) 50 MG tablet Take 1 tablet (50 mg total) by mouth daily. 90 tablet 1   valACYclovir (VALTREX) 1000 MG tablet TAKE 2 TABLETS BY MOUTH TWICE A DAY FOR TWO DOSES 16 tablet 0   VITAMIN D PO Take by mouth.     No current facility-administered medications for this visit.     Allergies:   Amoxicillin, Augmentin [amoxicillin-pot clavulanate], Codeine, Depakote [divalproex sodium], Latex, Lipitor [atorvastatin], Penicillins, Zocor [simvastatin], and Septra [sulfamethoxazole-trimethoprim]   Social History:  The patient  reports that she has never smoked. She has never used smokeless tobacco. She reports that she does not drink alcohol and does not use drugs.   Family History:   family history includes Asthma in her father; Breast cancer in her paternal grandmother; COPD in her father; Cancer in her mother; Diabetes in her mother; Emphysema in her father; Glaucoma in her father and mother; Heart disease in her mother; Hyperlipidemia in her mother; Hypertension in her mother and sister; Stroke (age of onset: 109) in her mother.    Review of Systems: Review of Systems  Constitutional: Negative.   HENT: Negative.    Respiratory: Negative.    Cardiovascular: Negative.   Gastrointestinal: Negative.   Musculoskeletal: Negative.   Neurological: Negative.   Psychiatric/Behavioral: Negative.    All other systems reviewed and are negative.   PHYSICAL EXAM: VS:  BP 130/80 (BP Location: Left Arm, Patient Position: Sitting, Cuff Size: Normal)   Pulse (!) 57   Ht 5' (1.524 m)   Wt 140 lb (63.5 kg)   SpO2 98%   BMI 27.34 kg/m  , BMI Body mass index is 27.34 kg/m. Constitutional:  oriented to person, place, and time. No distress.  HENT:  Head: Grossly normal Eyes:  no discharge. No scleral icterus.  Neck: No JVD, no carotid bruits  Cardiovascular: Regular rate and rhythm, no murmurs appreciated Pulmonary/Chest: Clear to auscultation bilaterally, no wheezes or  rails Abdominal: Soft.  no distension.  no tenderness.  Musculoskeletal: Normal range of motion Neurological:  normal muscle tone. Coordination normal. No atrophy Skin: Skin warm and dry Psychiatric: normal affect, pleasant  Recent Labs: 09/12/2021: ALT 35; BUN 17; Creatinine, Ser 0.90; Potassium 4.5; Sodium 141    Lipid Panel Lab Results  Component Value Date   CHOL 178 09/12/2021   HDL 47.30 09/12/2021   LDLCALC 98 06/13/2020   TRIG 304.0 (H) 09/12/2021      Wt Readings from Last 3 Encounters:  01/14/22 140 lb (63.5 kg)  12/05/21 141 lb 12.8 oz (64.3 kg)  10/09/21 144 lb 6.4 oz (65.5 kg)     ASSESSMENT AND PLAN:  Problem List Items Addressed This Visit       Cardiology Problems   Pure hypercholesterolemia   Essential hypertension, benign - Primary   Other Visit Diagnoses     DOE (dyspnea on exertion)         Shortness of breath Stable Recommend she continue her exercise program  Anxiety Managed by  primary care, stable  Hyperlipidemia Tolerating statin/Crestor, reasonable numbers   Hypertension Blood pressure is well controlled on today's visit. No changes made to the medications.   Prediabetes A1c elevated We have encouraged continued exercise, careful diet management in an effort to lose weight.     Total encounter time more than 30 minutes  Greater than 50% was spent in counseling and coordination of care with the patient    Signed, Esmond Plants, M.D., Ph.D. [place], [place]

## 2022-01-14 ENCOUNTER — Encounter: Payer: Self-pay | Admitting: Cardiovascular Disease

## 2022-01-14 ENCOUNTER — Telehealth: Payer: Self-pay | Admitting: Gastroenterology

## 2022-01-14 ENCOUNTER — Ambulatory Visit: Payer: PPO | Attending: Cardiovascular Disease | Admitting: Cardiovascular Disease

## 2022-01-14 VITALS — BP 130/80 | HR 57 | Ht 60.0 in | Wt 140.0 lb

## 2022-01-14 DIAGNOSIS — R0609 Other forms of dyspnea: Secondary | ICD-10-CM

## 2022-01-14 DIAGNOSIS — E7800 Pure hypercholesterolemia, unspecified: Secondary | ICD-10-CM

## 2022-01-14 DIAGNOSIS — I10 Essential (primary) hypertension: Secondary | ICD-10-CM | POA: Diagnosis not present

## 2022-01-14 NOTE — Telephone Encounter (Signed)
Good Afternoon Dr Havery Moros,  We have received a referral from patients primary care for patient to be seen for acid reflux and also Hoarse voice quality.  Patient was last see by another GI provider at [facility] for a procedure. Patient also has previous GI history with our practice as well back in 2018.  I am sending records for review. Please review and advise on scheduling.  Thank you

## 2022-01-14 NOTE — Patient Instructions (Addendum)
Medication Instructions:  Your physician recommends that you continue on your current medications as directed. Please refer to the Current Medication list given to you today.  If you need a refill on your cardiac medications before your next appointment, please call your pharmacy.   Lab work: No new labs needed  Testing/Procedures: No new testing needed  Follow-Up: At [facility], you and your health needs are our priority.  As part of our continuing mission to provide you with exceptional heart care, we have created designated Provider Care Teams.  These Care Teams include your primary Cardiologist (physician) and Advanced Practice Providers (APPs -  Physician Assistants and Nurse Practitioners) who all work together to provide you with the care you need, when you need it.  You will need a follow up appointment in 12 months  Providers on your designated Care Team:   Christopher Berge, NP Ryan Dunn, PA-C Cadence Furth, PA-C  COVID-19 Vaccine Information can be found at: [email or web address] For questions related to vaccine distribution or appointments, please email [email or web address] or call [phone].   

## 2022-01-16 ENCOUNTER — Telehealth: Payer: Self-pay | Admitting: Family Medicine

## 2022-01-16 NOTE — Telephone Encounter (Signed)
Left message for patient to call back and schedule Medicare Annual Wellness Visit (AWV).   Please offer to do virtually or by telephone.  Left office number and my jabber [phone].  Last AWV:10/26/2019  Please schedule at anytime with Nurse Health Advisor.

## 2022-01-19 ENCOUNTER — Encounter: Payer: Self-pay | Admitting: Gastroenterology

## 2022-01-20 DIAGNOSIS — M5412 Radiculopathy, cervical region: Secondary | ICD-10-CM | POA: Diagnosis not present

## 2022-01-20 DIAGNOSIS — M25561 Pain in right knee: Secondary | ICD-10-CM | POA: Diagnosis not present

## 2022-01-20 DIAGNOSIS — M25551 Pain in right hip: Secondary | ICD-10-CM | POA: Diagnosis not present

## 2022-01-22 DIAGNOSIS — C44622 Squamous cell carcinoma of skin of right upper limb, including shoulder: Secondary | ICD-10-CM | POA: Diagnosis not present

## 2022-01-22 DIAGNOSIS — D2361 Other benign neoplasm of skin of right upper limb, including shoulder: Secondary | ICD-10-CM | POA: Diagnosis not present

## 2022-01-28 DIAGNOSIS — R350 Frequency of micturition: Secondary | ICD-10-CM | POA: Diagnosis not present

## 2022-01-28 DIAGNOSIS — R351 Nocturia: Secondary | ICD-10-CM | POA: Diagnosis not present

## 2022-01-28 DIAGNOSIS — N3946 Mixed incontinence: Secondary | ICD-10-CM | POA: Diagnosis not present

## 2022-01-28 DIAGNOSIS — N3944 Nocturnal enuresis: Secondary | ICD-10-CM | POA: Diagnosis not present

## 2022-01-29 DIAGNOSIS — M25561 Pain in right knee: Secondary | ICD-10-CM | POA: Diagnosis not present

## 2022-01-29 DIAGNOSIS — M25551 Pain in right hip: Secondary | ICD-10-CM | POA: Diagnosis not present

## 2022-01-30 DIAGNOSIS — M2241 Chondromalacia patellae, right knee: Secondary | ICD-10-CM | POA: Diagnosis not present

## 2022-01-30 DIAGNOSIS — M25551 Pain in right hip: Secondary | ICD-10-CM | POA: Diagnosis not present

## 2022-01-30 DIAGNOSIS — M25561 Pain in right knee: Secondary | ICD-10-CM | POA: Diagnosis not present

## 2022-02-06 DIAGNOSIS — M5412 Radiculopathy, cervical region: Secondary | ICD-10-CM | POA: Diagnosis not present

## 2022-02-24 DIAGNOSIS — H401133 Primary open-angle glaucoma, bilateral, severe stage: Secondary | ICD-10-CM | POA: Diagnosis not present

## 2022-03-03 ENCOUNTER — Telehealth: Payer: Self-pay | Admitting: Family Medicine

## 2022-03-03 DIAGNOSIS — N3946 Mixed incontinence: Secondary | ICD-10-CM | POA: Diagnosis not present

## 2022-03-03 DIAGNOSIS — G5602 Carpal tunnel syndrome, left upper limb: Secondary | ICD-10-CM | POA: Diagnosis not present

## 2022-03-03 NOTE — Telephone Encounter (Signed)
Left message for patient to call back and schedule Medicare Annual Wellness Visit (AWV).   Please offer to do virtually or by telephone.  Left office number and my jabber [phone].  Last AWV:10/26/2019  Please schedule at anytime with Nurse Health Advisor.

## 2022-03-06 DIAGNOSIS — Z4802 Encounter for removal of sutures: Secondary | ICD-10-CM | POA: Diagnosis not present

## 2022-03-11 DIAGNOSIS — R350 Frequency of micturition: Secondary | ICD-10-CM | POA: Diagnosis not present

## 2022-03-11 DIAGNOSIS — N3946 Mixed incontinence: Secondary | ICD-10-CM | POA: Diagnosis not present

## 2022-03-20 ENCOUNTER — Ambulatory Visit (INDEPENDENT_AMBULATORY_CARE_PROVIDER_SITE_OTHER): Payer: PPO | Admitting: Gastroenterology

## 2022-03-20 ENCOUNTER — Encounter: Payer: Self-pay | Admitting: Gastroenterology

## 2022-03-20 VITALS — BP 132/84 | HR 74 | Ht 60.0 in | Wt 145.0 lb

## 2022-03-20 DIAGNOSIS — K219 Gastro-esophageal reflux disease without esophagitis: Secondary | ICD-10-CM

## 2022-03-20 DIAGNOSIS — K582 Mixed irritable bowel syndrome: Secondary | ICD-10-CM

## 2022-03-20 DIAGNOSIS — Z79899 Other long term (current) drug therapy: Secondary | ICD-10-CM

## 2022-03-20 MED ORDER — CITRUCEL PO POWD: 1.0000 | Freq: Every day | ORAL | Status: AC

## 2022-03-20 MED ORDER — PANTOPRAZOLE SODIUM 40 MG PO TBEC: 40.0000 mg | DELAYED_RELEASE_TABLET | Freq: Two times a day (BID) | ORAL | 0 refills | Status: DC

## 2022-03-20 NOTE — Progress Notes (Signed)
HPI :  68 old female with a history of reflux, IBS, asthma, here for new patient visit to discuss her history of reflux and IBS.  She has had reflux for several years.  Her main symptoms from this are regurgitation and waterbrash.  Particularly at night, she has to sleep with the head of her bed elevated or else she can get severe regurgitation that can rarely lead to vomiting.  She also has regurgitation when she bends over.  She has some nausea that can come and go with the symptoms, she vomits rarely from this.  She denies much typical pyrosis.  No dysphagia, she is eating okay.  She has been on proton pump inhibitor for some time.  Previously was on Prilosec then switched over to Protonix which she has been on for a few years.  She is currently dosed at 40 mg daily.  Over the past year she had Pepcid 40 mg added which she takes nightly.  She does not think this has helped too much.  Despite taking both Pepcid and Protonix once every day, she has breakthrough symptoms typically every night that bother her.  She is not satisfied with the level of control of her symptoms on her current regimen.  She also has hoarse voice at times when the regurgitation gets bad and has some cough as well.  She denies any abdominal pains.  She has a reported history of IBS she states since her 56s.  She has loose stools about 50% of the time in the constipated stools about 50% of the time.  She denies any blood in her stools.  She had a colonoscopy in 2021 which did not show any high risk or concerning lesions.  Her aunt had colon cancer, her uncle had throat cancer, her aunt also had stomach cancer.  She had an EGD more than 10 years ago at another center to cannot recall the results.  She denies any [place] use.  She denies any problems with chronic kidney disease or problems with her bone density.  She denies any cardiopulmonary symptoms recently.  She has a history of childhood asthma which does not bother her  anymore.   Prior work-up: Colonoscopy 12/20/19: Dr. Marius Ditch - 2 small rectal polyps, diverticulosis -   DIAGNOSIS:  A. RECTAL POLYPS X2; COLD SNARE:  - FRAGMENTS (X2) OF HYPERPLASTIC POLYPS.  - NEGATIVE FOR DYSPLASIA AND MALIGNANCY.    CT scan abdomen / pelvis 03/07/20: IMPRESSION: Findings consistent with acute sigmoid colon diverticulitis. No perforation or abscess.  Past Medical History:  Diagnosis Date   Anxiety    Arthritis    Phreesia 04/12/2020   Asthma    Cataract    Phreesia 10/25/2019   Cervical spondylosis    Complication of anesthesia    with colonoscopy woke up twice   Depression    Depression    Phreesia 04/12/2020   GERD (gastroesophageal reflux disease)    Glaucoma    Phreesia 10/25/2019   Hyperlipidemia    Hypertension    IBS (irritable bowel syndrome)    Constipation with diarrhea.   Oral herpes      Past Surgical History:  Procedure Laterality Date   ABDOMINAL HYSTERECTOMY  1978   DUB; ovaries intact.   APPENDECTOMY  1988   BREAST BIOPSY Bilateral 2009   Calcium Deposit- 1980's and also in 2009/ Right side 1995   BREAST EXCISIONAL BIOPSY Bilateral    BREAST SURGERY N/A    Phreesia 10/25/2019   CATARACT  EXTRACTION W/PHACO Left 10/23/2020   Procedure: CATARACT EXTRACTION PHACO AND INTRAOCULAR LENS PLACEMENT (IOC) LEFT TORIC LENS KAHOOK DUAL BLADE GONIOTOMY,  VISION BLUE 7.69 00:53.0;  Surgeon: Leandrew Koyanagi, MD;  Location: [facility];  Service: Ophthalmology;  Laterality: Left;   CATARACT EXTRACTION W/PHACO Right 11/20/2020   Procedure: CATARACT EXTRACTION PHACO AND INTRAOCULAR LENS PLACEMENT ([place]) RIGHT LENS KAHOOK DUAL BLADE GONIOTOMY VISION BLUE 8.58 01:24.9;  Surgeon: Leandrew Koyanagi, MD;  Location: [place];  Service: Ophthalmology;  Laterality: Right;  latex   COLONOSCOPY  05/11/2009   Normal.  Previous colonoscopy with polyps. [facility].     COLONOSCOPY WITH PROPOFOL N/A 12/20/2019   Procedure: COLONOSCOPY WITH  PROPOFOL;  Surgeon: Lin Landsman, MD;  Location: [facility] END[facility]COPY;  Service: Gastroenterology;  Laterality: N/A;   DEBRIDEMENT TENNIS ELBOW     ROTATOR CUFF REPAIR     TONSILLECTOMY     Family History  Problem Relation Age of Onset   Hyperlipidemia Mother    Hypertension Mother    Cancer Mother        lung   Diabetes Mother    Glaucoma Mother    Stroke Mother 18   Heart disease Mother    Asthma Father    Emphysema Father    COPD Father    Glaucoma Father    Hypertension Sister    Breast cancer Paternal Grandmother    Colon cancer Neg Hx    Stomach cancer Neg Hx    Esophageal cancer Neg Hx    Social History   Tobacco Use   Smoking status: Never   Smokeless tobacco: Never  Vaping Use   Vaping Use: Never used  Substance Use Topics   Alcohol use: No   Drug use: Never   Current Outpatient Medications  Medication Sig Dispense Refill   albuterol (PROVENTIL HFA;VENTOLIN HFA) 108 (90 Base) MCG/ACT inhaler Inhale 2 puffs into the lungs every 4 (four) hours as needed for wheezing or shortness of breath (cough, shortness of breath or wheezing.). 1 Inhaler 1   dorzolamide-timolol (COSOPT) 22.3-6.8 MG/ML ophthalmic solution      famotidine (PEPCID) 40 MG tablet TAKE 1 TABLET BY MOUTH DAILY 90 tablet 0   latanoprost (XALATAN) 0.005 % ophthalmic solution 1 drop at bedtime.     pantoprazole (PROTONIX) 40 MG tablet Take 1 tablet (40 mg total) by mouth daily. 90 tablet 3   rosuvastatin (CRESTOR) 10 MG tablet Take 1 tablet (10 mg total) by mouth daily. 90 tablet 3   spironolactone (ALDACTONE) 50 MG tablet Take 1 tablet (50 mg total) by mouth daily. 90 tablet 1   VITAMIN D PO Take by mouth.     FLUoxetine (PROZAC) 20 MG capsule Take 3 capsules (60 mg total) by mouth daily. 270 capsule 1   hydrALAZINE (APRESOLINE) 25 MG tablet Take 1 tablet (25 mg total) by mouth 3 (three) times daily as needed. (Patient not taking: Reported on 03/20/2022) 90 tablet 1   meloxicam (MOBIC) 15 MG tablet  TAKE 1 TABLET BY MOUTH DAILY (Patient not taking: Reported on 03/20/2022) 30 tablet 0   methylPREDNISolone (MEDROL DOSEPAK) 4 MG TBPK tablet Take 4 mg by mouth. (Patient not taking: Reported on 03/20/2022)     mirabegron ER (MYRBETRIQ) 25 MG TB24 tablet Take 1 tablet (25 mg total) by mouth daily. (Patient not taking: Reported on 03/20/2022) 30 tablet 0   mirtazapine (REMERON) 7.5 MG tablet Take 1 tablet (7.5 mg total) by mouth at bedtime as needed. (Patient not taking:  Reported on 03/20/2022) 30 tablet 2   Omega-3 Fatty Acids (FISH OIL OMEGA-3) 1000 MG CAPS Take by mouth.     valACYclovir (VALTREX) 1000 MG tablet TAKE 2 TABLETS BY MOUTH TWICE A DAY FOR TWO DOSES (Patient not taking: Reported on 03/20/2022) 16 tablet 0   No current facility-administered medications for this visit.   Allergies  Allergen Reactions   Amoxicillin Anaphylaxis   Augmentin [Amoxicillin-Pot Clavulanate]    Codeine Itching   Depakote [Divalproex Sodium]    Latex Hives   Lipitor [Atorvastatin]    Penicillins    Zocor [Simvastatin]    Septra [Sulfamethoxazole-Trimethoprim] Rash     Review of Systems: All systems reviewed and negative except where noted in HPI.   Lab Results  Component Value Date   WBC 9.9 03/07/2020   HGB 13.5 03/07/2020   HCT 40.0 03/07/2020   MCV 94.1 03/07/2020   PLT 246 03/07/2020    Lab Results  Component Value Date   CREATININE 0.90 09/12/2021   BUN 17 09/12/2021   NA 141 09/12/2021   K 4.5 09/12/2021   CL 104 09/12/2021   CO2 28 09/12/2021    Lab Results  Component Value Date   ALT 35 09/12/2021   AST 29 09/12/2021   ALKPHOS 91 09/12/2021   BILITOT 0.7 09/12/2021      Physical Exam: BP 132/84   Pulse 74   Ht 5' (1.524 m)   Wt 145 lb (65.8 kg)   BMI 28.32 kg/m  Constitutional: Pleasant,well-developed, female in no acute distress. HEENT: Normocephalic and atraumatic. Conjunctivae are normal. No scleral icterus. Neck supple.  Cardiovascular: Normal rate,  regular rhythm.  Pulmonary/chest: Effort normal and breath sounds normal.  Abdominal: Soft, nondistended, nontender.  There are no masses palpable.  Extremities: no edema Lymphadenopathy: No cervical adenopathy noted. Neurological: Alert and oriented to person place and time. Skin: Skin is warm and dry. No rashes noted. Psychiatric: Normal mood and affect. Behavior is normal.   ASSESSMENT: 71 y.o. female here for assessment of the following  1. Gastroesophageal reflux disease, unspecified whether esophagitis present   2. Long-term current use of proton pump inhibitor therapy   3. Irritable bowel syndrome with both constipation and diarrhea    Longstanding GERD with significant symptoms of regurgitation, waterbrash, voice hoarseness, cough, and that can rarely be associated with vomiting.  Significant nocturnal symptoms.  Poor control with once daily dosing of Protonix and Pepcid.  Discussed reflux with her and treatment options, and how to further evaluate this.  I am recommending an upper endoscopy to assess her anatomy, rule out large hiatal hernia, assess for Barrett's esophagus etc.  I discussed risks and benefits of endoscopy and anesthesia with her and she wants to proceed.  In the interim recommend she increase her Protonix to twice daily dosing, take prior to meals to maximize absorption.  I discussed chronic PPI use with her, risks, long-term recommend the lowest daily dosing however for now given her poor control she will try twice daily dosing for at least 4 weeks.  If for EGD shows large hiatal hernia will need to consider repair given her refractory symptoms if they persist despite twice daily PPI.  Alternatively if she does not have a significant hiatal hernia could consider TIF if her symptoms persist and she is a candidate for this endoscopically, versus surgery.  She agrees and will await her course of twice daily PPI and EGD with further recommendations  Otherwise discussed her  bowel habits over time,  suspicious for IBS.  Her colonoscopy is up-to-date without any concerning pathology.  Discussed options, recommend trial of Citrucel daily to provide some bowel regularity.  If no benefit she can contact me to discuss other options.   PLAN: - schedule EGD in the [place]  - increase protonix to '40mg'$  twice daily - take 30 to 60 minutes prior to meals  - counseled on risks / benefits of chronic PPI use. Long term use lowest dose. May need to consider TIF if she is a candidate for it pending EGD and symptoms persist or if she wants to come off PPI - trial of Citrucel daily  Jolly Mango, MD [facility]  CC: Wendie Agreste, MD

## 2022-03-20 NOTE — Patient Instructions (Addendum)
If you are age 72 or older, your body mass index should be between 23-30. Your Body mass index is 28.32 kg/m. If this is out of the aforementioned range listed, please consider follow up with your Primary Care Provider.  If you are age 33 or younger, your body mass index should be between 19-25. Your Body mass index is 28.32 kg/m. If this is out of the aformentioned range listed, please consider follow up with your Primary Care Provider.   ________________________________________________________   Maria Sweeney have been scheduled for an endoscopy. Please follow written instructions given to you at your visit today. If you use inhalers (even only as needed), please bring them with you on the day of your procedure.   We have sent the following medications to your pharmacy for you to pick up at your convenience: Protonix 40 mg: Take twice daily before meals (sent to [facility])  Please purchase the following medications over the counter and take as directed: Citrucel: Take once daily  Thank you for entrusting me with your care and for choosing [facility], Dr. Carnegie Cellar

## 2022-03-30 DIAGNOSIS — H401133 Primary open-angle glaucoma, bilateral, severe stage: Secondary | ICD-10-CM | POA: Diagnosis not present

## 2022-03-31 ENCOUNTER — Telehealth: Payer: Self-pay | Admitting: Family Medicine

## 2022-03-31 NOTE — Telephone Encounter (Signed)
Left message for patient to call back and schedule Medicare Annual Wellness Visit (AWV) in office.   If not able to come in office, please offer to do virtually or by telephone.  Left office number and my jabber [phone].  Last AWV:10/26/2019   Please schedule at anytime with Nurse Health Advisor.

## 2022-04-01 ENCOUNTER — Telehealth: Payer: Self-pay

## 2022-04-01 NOTE — Telephone Encounter (Signed)
Patient called in and noted she is having some kind of rash with itching and what seems like hives, attempted to schedule no appt available I advised the patient to take a benadryl and see if this improved if not be seen in Urgent care, notes she took one last night and it seemed to worsen, I told her she should be seen in urgent care given that information.   Thanked [place] stated she would follow up with [place] post holidays

## 2022-04-03 DIAGNOSIS — L42 Pityriasis rosea: Secondary | ICD-10-CM | POA: Diagnosis not present

## 2022-04-06 ENCOUNTER — Encounter: Payer: PPO | Admitting: Gastroenterology

## 2022-04-22 ENCOUNTER — Other Ambulatory Visit: Payer: Self-pay | Admitting: Family Medicine

## 2022-04-22 DIAGNOSIS — K219 Gastro-esophageal reflux disease without esophagitis: Secondary | ICD-10-CM

## 2022-04-23 DIAGNOSIS — N3946 Mixed incontinence: Secondary | ICD-10-CM | POA: Diagnosis not present

## 2022-04-23 DIAGNOSIS — R350 Frequency of micturition: Secondary | ICD-10-CM | POA: Diagnosis not present

## 2022-05-14 DIAGNOSIS — N3946 Mixed incontinence: Secondary | ICD-10-CM | POA: Diagnosis not present

## 2022-05-14 DIAGNOSIS — R279 Unspecified lack of coordination: Secondary | ICD-10-CM | POA: Diagnosis not present

## 2022-05-14 DIAGNOSIS — R351 Nocturia: Secondary | ICD-10-CM | POA: Diagnosis not present

## 2022-05-14 DIAGNOSIS — R350 Frequency of micturition: Secondary | ICD-10-CM | POA: Diagnosis not present

## 2022-05-23 ENCOUNTER — Other Ambulatory Visit: Payer: Self-pay | Admitting: Family Medicine

## 2022-05-23 DIAGNOSIS — K219 Gastro-esophageal reflux disease without esophagitis: Secondary | ICD-10-CM

## 2022-06-04 DIAGNOSIS — R350 Frequency of micturition: Secondary | ICD-10-CM | POA: Diagnosis not present

## 2022-06-04 DIAGNOSIS — N3946 Mixed incontinence: Secondary | ICD-10-CM | POA: Diagnosis not present

## 2022-06-14 ENCOUNTER — Encounter: Payer: Self-pay | Admitting: Certified Registered Nurse Anesthetist

## 2022-06-19 ENCOUNTER — Ambulatory Visit (AMBULATORY_SURGERY_CENTER): Payer: PPO | Admitting: Gastroenterology

## 2022-06-19 ENCOUNTER — Encounter: Payer: Self-pay | Admitting: Gastroenterology

## 2022-06-19 VITALS — BP 128/48 | HR 78 | Temp 97.5°F | Resp 14 | Ht 62.0 in | Wt 145.0 lb

## 2022-06-19 DIAGNOSIS — J45909 Unspecified asthma, uncomplicated: Secondary | ICD-10-CM | POA: Diagnosis not present

## 2022-06-19 DIAGNOSIS — R1310 Dysphagia, unspecified: Secondary | ICD-10-CM

## 2022-06-19 DIAGNOSIS — K219 Gastro-esophageal reflux disease without esophagitis: Secondary | ICD-10-CM

## 2022-06-19 DIAGNOSIS — I10 Essential (primary) hypertension: Secondary | ICD-10-CM | POA: Diagnosis not present

## 2022-06-19 DIAGNOSIS — K222 Esophageal obstruction: Secondary | ICD-10-CM | POA: Diagnosis not present

## 2022-06-19 DIAGNOSIS — K317 Polyp of stomach and duodenum: Secondary | ICD-10-CM

## 2022-06-19 MED ORDER — SODIUM CHLORIDE 0.9 % IV SOLN: 500.0000 mL | Freq: Once | INTRAVENOUS | Status: DC

## 2022-06-19 MED ORDER — PANTOPRAZOLE SODIUM 40 MG PO TBEC: 40.0000 mg | DELAYED_RELEASE_TABLET | Freq: Two times a day (BID) | ORAL | 3 refills | Status: DC

## 2022-06-19 NOTE — Progress Notes (Signed)
Per Dr Havery Moros- Pt can continue regular diet tody ,does not need to follow dilatation diet

## 2022-06-19 NOTE — Op Note (Signed)
[facility] Patient Name: Maria Sweeney Procedure Date: 06/19/2022 2:27 PM MRN: [id number] Endoscopist: Remo Lipps P. Havery Moros , MD, [id number] Age: 72 Referring MD:  Date of Birth: 05/22/1950 Gender: Female Account #: [id number] Procedure:                Upper GI endoscopy Indications:              Dysphagia - ongoing for 1 month, Follow-up of                            gastro-esophageal reflux disease - symptomatic on                            protonix 33m once daily, now on twice daily dosing                            with improvement Medicines:                Monitored Anesthesia Care Procedure:                Pre-Anesthesia Assessment:                           - Prior to the procedure, a History and Physical                            was performed, and patient medications and                            allergies were reviewed. The patient's tolerance of                            previous anesthesia was also reviewed. The risks                            and benefits of the procedure and the sedation                            options and risks were discussed with the patient.                            All questions were answered, and informed consent                            was obtained. Prior Anticoagulants: The patient has                            taken no anticoagulant or antiplatelet agents. ASA                            Grade Assessment: II - A patient with mild systemic                            disease. After reviewing the risks and benefits,  the patient was deemed in satisfactory condition to                            undergo the procedure.                           After obtaining informed consent, the endoscope was                            passed under direct vision. Throughout the                            procedure, the patient's blood pressure, pulse, and                            oxygen saturations were monitored  continuously. The                            GIF [id number] was introduced through the                            mouth, and advanced to the second part of duodenum.                            The upper GI endoscopy was accomplished without                            difficulty. The patient tolerated the procedure                            well. Scope In: Scope Out: Findings:                 Esophagogastric landmarks were identified: the                            Z-line was found at 33 cm, the gastroesophageal                            junction was found at 33 cm and the upper extent of                            the gastric folds was found at 33 cm from the                            incisors.                           The gastroesophageal flap valve was visualized                            endoscopically and classified as Hill Grade I                            (prominent fold, tight to endoscope).  The exam of the esophagus was otherwise normal. No                            obvious stenosis / stricture. No inflammatory                            changes.                           A guidewire was placed and the scope was withdrawn.                            Empiric dilation was performed in the entire                            esophagus with a Savary dilator with mild                            resistance at 17 mm. Relook endoscopy showed no                            mucosal wrents. Biopsies were obtained from the                            proximal and distal esophagus with cold forceps to                            rule out eosinophilic esophagitis.                           Multiple small sessile polyps were found in the                            gastric fundus and in the gastric body. Likely                            benign fundic gland polyps in the setting of PPI                            use. One representative polyp was removed with a                             cold biopsy forceps. Resection and retrieval were                            complete.                           The exam of the stomach was otherwise normal.                           The examined duodenum was normal. Complications:            No immediate complications. Estimated blood loss:  Minimal. Estimated Blood Loss:     Estimated blood loss was minimal. Impression:               - Esophagogastric landmarks identified.                           - Gastroesophageal flap valve classified as Hill                            Grade I (prominent fold, tight to endoscope).                           - Normal esophagus otherwise - empiric dilation                            performed, biopsied.                           - Multiple gastric polyps. Representative sample                            resected and retrieved. Suspect benign fundic gland                            polyps.                           - Normal stomach otherwise.                           - Normal examined duodenum. Recommendation:           - Patient has a contact number available for                            emergencies. The signs and symptoms of potential                            delayed complications were discussed with the                            patient. Return to normal activities tomorrow.                            Written discharge instructions were provided to the                            patient.                           - Resume previous diet.                           - Continue present medications (protonix twice                            daily)                           -  Await pathology results.                           - Await course post dilation Sufian Ravi P. Havery Moros, MD 06/19/2022 3:08:53 PM This report has been signed electronically.

## 2022-06-19 NOTE — Patient Instructions (Signed)
   Resume regular diet per Dr Havery Moros   Await biopsy results    YOU HAD AN ENDOSCOPIC PROCEDURE TODAY AT [place]:   Refer to the procedure report that was given to you for any specific questions about what was found during the examination.  If the procedure report does not answer your questions, please call your gastroenterologist to clarify.  If you requested that your care partner not be given the details of your procedure findings, then the procedure report has been included in a sealed envelope for you to review at your convenience later.  YOU SHOULD EXPECT: Some feelings of bloating in the abdomen. Passage of more gas than usual.  Walking can help get rid of the air that was put into your GI tract during the procedure and reduce the bloating. If you had a lower endoscopy (such as a colonoscopy or flexible sigmoidoscopy) you may notice spotting of blood in your stool or on the toilet paper. If you underwent a bowel prep for your procedure, you may not have a normal bowel movement for a few days.  Please Note:  You might notice some irritation and congestion in your nose or some drainage.  This is from the oxygen used during your procedure.  There is no need for concern and it should clear up in a day or so.  SYMPTOMS TO REPORT IMMEDIATELY:  Following upper endoscopy (EGD)  Vomiting of blood or coffee ground material  New chest pain or pain under the shoulder blades  Painful or persistently difficult swallowing  New shortness of breath  Fever of 100F or higher  Black, tarry-looking stools  For urgent or emergent issues, a gastroenterologist can be reached at any hour by calling [phone]. Do not use MyChart messaging for urgent concerns.    DIET:  We do recommend a small meal at first, but then you may proceed to your regular diet.  Drink plenty of fluids but you should avoid alcoholic beverages for 24 hours.  ACTIVITY:  You should plan to take it easy  for the rest of today and you should NOT DRIVE or use heavy machinery until tomorrow (because of the sedation medicines used during the test).    FOLLOW UP: Our staff will call the number listed on your records the next business day following your procedure.  We will call around 7:15- 8:00 am to check on you and address any questions or concerns that you may have regarding the information given to you following your procedure. If we do not reach you, we will leave a message.     If any biopsies were taken you will be contacted by phone or by letter within the next 1-3 weeks.  Please call us at [phone] if you have not heard about the biopsies in 3 weeks.    SIGNATURES/CONFIDENTIALITY: You and/or your care partner have signed paperwork which will be entered into your electronic medical record.  These signatures attest to the fact that that the information above on your After Visit Summary has been reviewed and is understood.  Full responsibility of the confidentiality of this discharge information lies with you and/or your care-partner.

## 2022-06-19 NOTE — Progress Notes (Signed)
Called to room to assist during endoscopic procedure.  Patient ID and intended procedure confirmed with present staff. Received instructions for my participation in the procedure from the performing physician.  

## 2022-06-19 NOTE — Progress Notes (Signed)
[facility] History and Physical   Primary Care Physician:  Wendie Agreste, MD   Reason for Procedure:   GERD, dysphagia  Plan:    EGD     HPI: Maria Sweeney is a 72 y.o. female  here for EGD to evaluate reflux symptoms - was on protonix 2m daily in addition to pepcid with ongoing symptoms of pyrosis and regurgitation. Protonix dose increased to twice daily dosing and scheduled for EGD to further evaluate.  On protonix BID her reflux symptoms have been improved but now she endorse some intermittent dysphagia for the past month which has been quite bothersome for her.  Otherwise feels well without any cardiopulmonary symptoms.   Of note, she has a temporary crown in place in left upper mouth. She understands risk of injury to her tooth is possible, hopefully unlikely, and wishes to proceed with this exam.  I have discussed risks / benefits of anesthesia and endoscopic procedure with DEmeline Darlingand they wish to proceed with the exams as outlined today.    Past Medical History:  Diagnosis Date   Anxiety    Arthritis    Phreesia 04/12/2020   Asthma    Cataract    Phreesia 10/25/2019   Cervical spondylosis    Complication of anesthesia    with colonoscopy woke up twice   Depression    Depression    Phreesia 04/12/2020   GERD (gastroesophageal reflux disease)    Glaucoma    Phreesia 10/25/2019   Hyperlipidemia    Hypertension    IBS (irritable bowel syndrome)    Constipation with diarrhea.   Oral herpes     Past Surgical History:  Procedure Laterality Date   ABDOMINAL HYSTERECTOMY  1978   DUB; ovaries intact.   APPENDECTOMY  1988   BREAST BIOPSY Bilateral 2009   Calcium Deposit- 1980's and also in 2009/ Right side 1995   BREAST EXCISIONAL BIOPSY Bilateral    BREAST SURGERY N/A    Phreesia 10/25/2019   CATARACT EXTRACTION W/PHACO Left 10/23/2020   Procedure: CATARACT EXTRACTION PHACO AND INTRAOCULAR LENS PLACEMENT (IOC) LEFT TORIC LENS KAHOOK  DUAL BLADE GONIOTOMY,  VISION BLUE 7.69 00:53.0;  Surgeon: BLeandrew Koyanagi MD;  [facility]  Service: Ophthalmology;  Laterality: Left;   CATARACT EXTRACTION W/PHACO Right 11/20/2020   Procedure: CATARACT EXTRACTION PHACO AND INTRAOCULAR LENS PLACEMENT ([place] RIGHT LENS KAHOOK DUAL BLADE GONIOTOMY VISION BLUE 8.58 01:24.9;  Surgeon: BLeandrew Koyanagi MD;  Location: [facility]  Service: Ophthalmology;  Laterality: Right;  latex   COLONOSCOPY  05/11/2009   Normal.  Previous colonoscopy with polyps. [facility].     COLONOSCOPY WITH PROPOFOL N/A 12/20/2019   Procedure: COLONOSCOPY WITH PROPOFOL;  Surgeon: VLin Landsman MD;  [facility];  Service: Gastroenterology;  Laterality: N/A;   DEBRIDEMENT TENNIS ELBOW     ROTATOR CUFF REPAIR     TONSILLECTOMY     UPPER GASTROINTESTINAL ENDOSCOPY      Prior to Admission medications   Medication Sig Start Date End Date Taking? Authorizing Provider  dorzolamide-timolol (COSOPT) 22.3-6.8 MG/ML ophthalmic solution  12/25/19  Yes [provider]  famotidine (PEPCID) 40 MG tablet TAKE 1 TABLET BY MOUTH DAILY 05/25/22  Yes GWendie Agreste MD  FLUoxetine (PROZAC) 20 MG capsule Take 3 capsules (60 mg total) by mouth daily. 12/05/21  Yes GWendie Agreste MD  latanoprost (XALATAN) 0.005 % ophthalmic solution 1 drop at bedtime.   Yes [provider]  Omega-3 Fatty  Acids (FISH OIL OMEGA-3) 1000 MG CAPS Take by mouth.   Yes [provider]  oxybutynin (DITROPAN-XL) 10 MG 24 hr tablet Take 10 mg by mouth at bedtime. 05/23/22  Yes [provider]  pantoprazole (PROTONIX) 40 MG tablet Take 1 tablet (40 mg total) by mouth 2 (two) times daily before a meal. 03/20/22  Yes Nakea Gouger, Carlota Raspberry, MD  rosuvastatin (CRESTOR) 10 MG tablet Take 1 tablet (10 mg total) by mouth daily. 09/05/21  Yes Wendie Agreste, MD  spironolactone (ALDACTONE) 50 MG tablet TAKE 1 TABLET BY MOUTH DAILY 04/22/22   Yes Wendie Agreste, MD  VITAMIN D PO Take by mouth.   Yes [provider]  albuterol (PROVENTIL HFA;VENTOLIN HFA) 108 (90 Base) MCG/ACT inhaler Inhale 2 puffs into the lungs every 4 (four) hours as needed for wheezing or shortness of breath (cough, shortness of breath or wheezing.). 02/11/16   Wardell Honour, MD  hydrALAZINE (APRESOLINE) 25 MG tablet Take 1 tablet (25 mg total) by mouth 3 (three) times daily as needed. Patient not taking: Reported on 03/20/2022 06/19/20   Minna Merritts, MD  meloxicam (MOBIC) 15 MG tablet TAKE 1 TABLET BY MOUTH DAILY Patient not taking: Reported on 03/20/2022 11/28/21   Wendie Agreste, MD  methylcellulose (CITRUCEL) oral powder Take 1 packet by mouth daily. 03/20/22   Rifka Ramey, Carlota Raspberry, MD  methylPREDNISolone (MEDROL DOSEPAK) 4 MG TBPK tablet Take 4 mg by mouth. Patient not taking: Reported on 03/20/2022 09/22/21   [provider]  mirabegron ER (MYRBETRIQ) 25 MG TB24 tablet Take 1 tablet (25 mg total) by mouth daily. Patient not taking: Reported on 03/20/2022 12/05/21   Wendie Agreste, MD  mirtazapine (REMERON) 7.5 MG tablet Take 1 tablet (7.5 mg total) by mouth at bedtime as needed. Patient not taking: Reported on 03/20/2022 12/07/19   Jacelyn Pi, Lilia Argue, MD  valACYclovir (VALTREX) 1000 MG tablet TAKE 2 TABLETS BY MOUTH TWICE A DAY FOR TWO DOSES Patient not taking: Reported on 03/20/2022 08/22/21   Maximiano Coss, NP    Current Outpatient Medications  Medication Sig Dispense Refill   dorzolamide-timolol (COSOPT) 22.3-6.8 MG/ML ophthalmic solution      famotidine (PEPCID) 40 MG tablet TAKE 1 TABLET BY MOUTH DAILY 90 tablet 0   FLUoxetine (PROZAC) 20 MG capsule Take 3 capsules (60 mg total) by mouth daily. 270 capsule 1   latanoprost (XALATAN) 0.005 % ophthalmic solution 1 drop at bedtime.     Omega-3 Fatty Acids (FISH OIL OMEGA-3) 1000 MG CAPS Take by mouth.     oxybutynin (DITROPAN-XL) 10 MG 24 hr tablet Take 10 mg by mouth  at bedtime.     pantoprazole (PROTONIX) 40 MG tablet Take 1 tablet (40 mg total) by mouth 2 (two) times daily before a meal. 180 tablet 0   rosuvastatin (CRESTOR) 10 MG tablet Take 1 tablet (10 mg total) by mouth daily. 90 tablet 3   spironolactone (ALDACTONE) 50 MG tablet TAKE 1 TABLET BY MOUTH DAILY 90 tablet 1   VITAMIN D PO Take by mouth.     albuterol (PROVENTIL HFA;VENTOLIN HFA) 108 (90 Base) MCG/ACT inhaler Inhale 2 puffs into the lungs every 4 (four) hours as needed for wheezing or shortness of breath (cough, shortness of breath or wheezing.). 1 Inhaler 1   hydrALAZINE (APRESOLINE) 25 MG tablet Take 1 tablet (25 mg total) by mouth 3 (three) times daily as needed. (Patient not taking: Reported on 03/20/2022) 90 tablet 1   meloxicam (  MOBIC) 15 MG tablet TAKE 1 TABLET BY MOUTH DAILY (Patient not taking: Reported on 03/20/2022) 30 tablet 0   methylcellulose (CITRUCEL) oral powder Take 1 packet by mouth daily.     methylPREDNISolone (MEDROL DOSEPAK) 4 MG TBPK tablet Take 4 mg by mouth. (Patient not taking: Reported on 03/20/2022)     mirabegron ER (MYRBETRIQ) 25 MG TB24 tablet Take 1 tablet (25 mg total) by mouth daily. (Patient not taking: Reported on 03/20/2022) 30 tablet 0   mirtazapine (REMERON) 7.5 MG tablet Take 1 tablet (7.5 mg total) by mouth at bedtime as needed. (Patient not taking: Reported on 03/20/2022) 30 tablet 2   valACYclovir (VALTREX) 1000 MG tablet TAKE 2 TABLETS BY MOUTH TWICE A DAY FOR TWO DOSES (Patient not taking: Reported on 03/20/2022) 16 tablet 0   Current Facility-Administered Medications  Medication Dose Route Frequency Provider Last Rate Last Admin   0.9 %  sodium chloride infusion  500 mL Intravenous Once Yetta Flock, MD        Allergies as of 06/19/2022 - Review Complete 06/19/2022  Allergen Reaction Noted   Amoxicillin Anaphylaxis 12/24/2011   Augmentin [amoxicillin-pot clavulanate]  12/24/2011   Codeine Itching 12/24/2011   Depakote [divalproex  sodium]  12/24/2011   Latex Hives 12/24/2011   Lipitor [atorvastatin]  12/24/2011   Penicillins  12/24/2011   Zocor [simvastatin]  12/24/2011   Septra [sulfamethoxazole-trimethoprim] Rash 12/24/2011    Family History  Problem Relation Age of Onset   Hyperlipidemia Mother    Hypertension Mother    Cancer Mother        lung   Diabetes Mother    Glaucoma Mother    Stroke Mother 20   Heart disease Mother    Asthma Father    Emphysema Father    COPD Father    Glaucoma Father    Hypertension Sister    Colon cancer Maternal Aunt    Breast cancer Paternal Grandmother    Stomach cancer Neg Hx    Esophageal cancer Neg Hx    Rectal cancer Neg Hx     Social History   Socioeconomic History   Marital status: Divorced    Spouse name: Not on file   Number of children: 2   Years of education: Bachelors   Highest education level: Not on file  Occupational History   Occupation: Press photographer ASSOCIATE    Employer: [employer]    Comment: also works at [employer]   Occupation: retired  Tobacco Use   Smoking status: Never   Smokeless tobacco: Never  Vaping Use   Vaping Use: Never used  Substance and Sexual Activity   Alcohol use: No   Drug use: Never   Sexual activity: Yes    Partners: Male    Birth control/protection: Surgical, Post-menopausal  Other Topics Concern   Not on file  Social History Narrative   Marital status: divorced since 1999; dating seriously in 2019 since 2015.      Children: one son, one daughter; 2 grandchildren.      Lives: alone in house with 2 dogs     Employment:  Retired.  Needs to work in 2019.      Tobacco: none      Alcohol: rare      Drugs: none      Exercise:  Sporadic   Right-handed.   Occasional caffeine use.      ADLs: independent with ADLs; drives; no assistant devices.      Advanced Directives: none; FULL CODE.  Social Determinants of Health   Financial Resource Strain: Low Risk  (06/18/2017)   Overall Financial Resource Strain (CARDIA)     Difficulty of Paying Living Expenses: Not hard at all  Food Insecurity: No Food Insecurity (06/18/2017)   Hunger Vital Sign    Worried About Running Out of Food in the Last Year: Never true    Ran Out of Food in the Last Year: Never true  Transportation Needs: No Transportation Needs (06/18/2017)   PRAPARE - Hydrologist (Medical): No    Lack of Transportation (Non-Medical): No  Physical Activity: Inactive (06/18/2017)   Exercise Vital Sign    Days of Exercise per Week: 0 days    Minutes of Exercise per Session: 0 min  Stress: Stress Concern Present (06/18/2017)   [place]    Feeling of Stress : To some extent  Social Connections: Moderately Isolated (06/18/2017)   Social Connection and Isolation Panel [NHANES]    Frequency of Communication with Friends and Family: Never    Frequency of Social Gatherings with Friends and Family: Never    Attends Religious Services: More than 4 times per year    Active Member of [employer] or Organizations: No    Attends Archivist Meetings: Never    Marital Status: Divorced  Human resources officer Violence: Not At Risk (06/18/2017)   Humiliation, Afraid, Rape, and Kick questionnaire    Fear of Current or Ex-Partner: No    Emotionally Abused: No    Physically Abused: No    Sexually Abused: No    Review of Systems: All other review of systems negative except as mentioned in the HPI.  Physical Exam: Vital signs BP 133/68   Pulse 72   Temp (!) 97.5 F (36.4 C) (Skin)   Ht 5' 2"$  (1.575 m)   Wt 145 lb (65.8 kg)   SpO2 95%   BMI 26.52 kg/m   General:   Alert,  Well-developed, pleasant and cooperative in NAD Lungs:  Clear throughout to auscultation.   Heart:  Regular rate and rhythm Abdomen:  Soft, nontender and nondistended.   Neuro/Psych:  Alert and cooperative. Normal mood and affect. A and O x 3  Jolly Mango, MD [facility]

## 2022-06-19 NOTE — Progress Notes (Signed)
1452 Robinul 0.1 mg IV given due large amount of secretions upon assessment.  MD made aware, vss 

## 2022-06-19 NOTE — Progress Notes (Signed)
Report given to PACU, vss 

## 2022-06-22 ENCOUNTER — Telehealth: Payer: Self-pay

## 2022-06-22 NOTE — Telephone Encounter (Signed)
  Follow up Call-     06/19/2022    2:03 PM  Call back number  Post procedure Call Back phone  # [phone]  Permission to leave phone message Yes     Patient questions:  Do you have a fever, pain , or abdominal swelling? No. Pain Score  0 *  Have you tolerated food without any problems? Yes.    Have you been able to return to your normal activities? Yes.    Do you have any questions about your discharge instructions: Diet   No. Medications  No. Follow up visit  No.  Do you have questions or concerns about your Care? No.  Actions: * If pain score is 4 or above: No action needed, pain <4.

## 2022-07-07 DIAGNOSIS — D2261 Melanocytic nevi of right upper limb, including shoulder: Secondary | ICD-10-CM | POA: Diagnosis not present

## 2022-07-07 DIAGNOSIS — D225 Melanocytic nevi of trunk: Secondary | ICD-10-CM | POA: Diagnosis not present

## 2022-07-07 DIAGNOSIS — D2262 Melanocytic nevi of left upper limb, including shoulder: Secondary | ICD-10-CM | POA: Diagnosis not present

## 2022-07-07 DIAGNOSIS — B078 Other viral warts: Secondary | ICD-10-CM | POA: Diagnosis not present

## 2022-07-07 DIAGNOSIS — D2272 Melanocytic nevi of left lower limb, including hip: Secondary | ICD-10-CM | POA: Diagnosis not present

## 2022-07-07 DIAGNOSIS — Z85828 Personal history of other malignant neoplasm of skin: Secondary | ICD-10-CM | POA: Diagnosis not present

## 2022-07-07 DIAGNOSIS — D485 Neoplasm of uncertain behavior of skin: Secondary | ICD-10-CM | POA: Diagnosis not present

## 2022-07-14 ENCOUNTER — Other Ambulatory Visit: Payer: Self-pay | Admitting: Registered Nurse

## 2022-07-14 DIAGNOSIS — Z1231 Encounter for screening mammogram for malignant neoplasm of breast: Secondary | ICD-10-CM

## 2022-07-20 ENCOUNTER — Telehealth: Payer: Self-pay

## 2022-07-20 NOTE — Telephone Encounter (Signed)
Patient scheduled for appointment on Thursday, 5-30 at 9:00 am. Left detailed message for patient with appointment info. Sent MyChart message

## 2022-07-20 NOTE — Telephone Encounter (Signed)
-----   Message from Roetta Sessions, [place] sent at 06/25/2022  5:41 PM EST ----- Regarding: OV [facility] would like to see patient for OV in May/June for dysphagia/GERD (f/u EGD)

## 2022-07-27 DIAGNOSIS — H401133 Primary open-angle glaucoma, bilateral, severe stage: Secondary | ICD-10-CM | POA: Diagnosis not present

## 2022-07-30 DIAGNOSIS — Z961 Presence of intraocular lens: Secondary | ICD-10-CM | POA: Diagnosis not present

## 2022-07-30 DIAGNOSIS — M5416 Radiculopathy, lumbar region: Secondary | ICD-10-CM | POA: Diagnosis not present

## 2022-07-30 DIAGNOSIS — H401133 Primary open-angle glaucoma, bilateral, severe stage: Secondary | ICD-10-CM | POA: Diagnosis not present

## 2022-08-03 ENCOUNTER — Other Ambulatory Visit: Payer: Self-pay | Admitting: Family Medicine

## 2022-08-03 ENCOUNTER — Other Ambulatory Visit: Payer: Self-pay | Admitting: Gastroenterology

## 2022-08-03 DIAGNOSIS — K219 Gastro-esophageal reflux disease without esophagitis: Secondary | ICD-10-CM

## 2022-08-03 DIAGNOSIS — F32A Depression, unspecified: Secondary | ICD-10-CM

## 2022-08-04 ENCOUNTER — Telehealth: Payer: Self-pay | Admitting: Family Medicine

## 2022-08-04 NOTE — Telephone Encounter (Signed)
Contacted Maria Sweeney to schedule their annual wellness visit. Appointment made for 08/13/2022.  Thank you,  [place] Direct dial  [phone]

## 2022-08-10 ENCOUNTER — Other Ambulatory Visit: Payer: Self-pay | Admitting: Family Medicine

## 2022-08-10 DIAGNOSIS — E7800 Pure hypercholesterolemia, unspecified: Secondary | ICD-10-CM

## 2022-08-13 ENCOUNTER — Ambulatory Visit: Payer: Medicare HMO

## 2022-08-13 DIAGNOSIS — Z0000 Encounter for general adult medical examination without abnormal findings: Secondary | ICD-10-CM | POA: Diagnosis not present

## 2022-08-13 NOTE — Patient Instructions (Signed)
Maria Sweeney , Thank you for taking time to come for your Medicare Wellness Visit. I appreciate your ongoing commitment to your health goals. Please review the following plan we discussed and let me know if I can assist you in the future.   Screening recommendations/referrals: Colonoscopy: up to date Mammogram: scheduled Bone Density: schedule with mammogram next year Recommended yearly ophthalmology/optometry visit for glaucoma screening and checkup Recommended yearly dental visit for hygiene and checkup  Vaccinations: Influenza vaccine: up to date Pneumococcal vaccine: up to date Tdap vaccine: Education provided Shingles vaccine: Education provided    Advanced directives: Education provided  Conditions/risks identified:      Preventive Care 85 Years and Older, Female Preventive care refers to lifestyle choices and visits with your health care provider that can promote health and wellness. What does preventive care include? A yearly physical exam. This is also called an annual well check. Dental exams once or twice a year. Routine eye exams. Ask your health care provider how often you should have your eyes checked. Personal lifestyle choices, including: Daily care of your teeth and gums. Regular physical activity. Eating a healthy diet. Avoiding tobacco and drug use. Limiting alcohol use. Practicing safe sex. Taking low-dose aspirin every day. Taking vitamin and mineral supplements as recommended by your health care provider. What happens during an annual well check? The services and screenings done by your health care provider during your annual well check will depend on your age, overall health, lifestyle risk factors, and family history of disease. Counseling  Your health care provider may ask you questions about your: Alcohol use. Tobacco use. Drug use. Emotional well-being. Home and relationship well-being. Sexual activity. Eating habits. History of falls. Memory  and ability to understand (cognition). Work and work Statistician. Reproductive health. Screening  You may have the following tests or measurements: Height, weight, and BMI. Blood pressure. Lipid and cholesterol levels. These may be checked every 5 years, or more frequently if you are over 65 years old. Skin check. Lung cancer screening. You may have this screening every year starting at age 54 if you have a 30-pack-year history of smoking and currently smoke or have quit within the past 15 years. Fecal occult blood test (FOBT) of the stool. You may have this test every year starting at age 63. Flexible sigmoidoscopy or colonoscopy. You may have a sigmoidoscopy every 5 years or a colonoscopy every 10 years starting at age 26. Hepatitis C blood test. Hepatitis B blood test. Sexually transmitted disease (STD) testing. Diabetes screening. This is done by checking your blood sugar (glucose) after you have not eaten for a while (fasting). You may have this done every 1-3 years. Bone density scan. This is done to screen for osteoporosis. You may have this done starting at age 61. Mammogram. This may be done every 1-2 years. Talk to your health care provider about how often you should have regular mammograms. Talk with your health care provider about your test results, treatment options, and if necessary, the need for more tests. Vaccines  Your health care provider may recommend certain vaccines, such as: Influenza vaccine. This is recommended every year. Tetanus, diphtheria, and acellular pertussis (Tdap, Td) vaccine. You may need a Td booster every 10 years. Zoster vaccine. You may need this after age 98. Pneumococcal 13-valent conjugate (PCV13) vaccine. One dose is recommended after age 51. Pneumococcal polysaccharide (PPSV23) vaccine. One dose is recommended after age 26. Talk to your health care provider about which screenings and vaccines you  need and how often you need them. This  information is not intended to replace advice given to you by your health care provider. Make sure you discuss any questions you have with your health care provider. Document Released: 05/24/2015 Document Revised: 01/15/2016 Document Reviewed: 02/26/2015 Elsevier Interactive Patient Education  2017 [place] Prevention in the Home Falls can cause injuries. They can happen to people of all ages. There are many things you can do to make your home safe and to help prevent falls. What can I do on the outside of my home? Regularly fix the edges of walkways and driveways and fix any cracks. Remove anything that might make you trip as you walk through a door, such as a raised step or threshold. Trim any bushes or trees on the path to your home. Use bright outdoor lighting. Clear any walking paths of anything that might make someone trip, such as rocks or tools. Regularly check to see if handrails are loose or broken. Make sure that both sides of any steps have handrails. Any raised decks and porches should have guardrails on the edges. Have any leaves, snow, or ice cleared regularly. Use sand or salt on walking paths during winter. Clean up any spills in your garage right away. This includes oil or grease spills. What can I do in the bathroom? Use night lights. Install grab bars by the toilet and in the tub and shower. Do not use towel bars as grab bars. Use non-skid mats or decals in the tub or shower. If you need to sit down in the shower, use a plastic, non-slip stool. Keep the floor dry. Clean up any water that spills on the floor as soon as it happens. Remove soap buildup in the tub or shower regularly. Attach bath mats securely with double-sided non-slip rug tape. Do not have throw rugs and other things on the floor that can make you trip. What can I do in the bedroom? Use night lights. Make sure that you have a light by your bed that is easy to reach. Do not use any sheets or  blankets that are too big for your bed. They should not hang down onto the floor. Have a firm chair that has side arms. You can use this for support while you get dressed. Do not have throw rugs and other things on the floor that can make you trip. What can I do in the kitchen? Clean up any spills right away. Avoid walking on wet floors. Keep items that you use a lot in easy-to-reach places. If you need to reach something above you, use a strong step stool that has a grab bar. Keep electrical cords out of the way. Do not use floor polish or wax that makes floors slippery. If you must use wax, use non-skid floor wax. Do not have throw rugs and other things on the floor that can make you trip. What can I do with my stairs? Do not leave any items on the stairs. Make sure that there are handrails on both sides of the stairs and use them. Fix handrails that are broken or loose. Make sure that handrails are as long as the stairways. Check any carpeting to make sure that it is firmly attached to the stairs. Fix any carpet that is loose or worn. Avoid having throw rugs at the top or bottom of the stairs. If you do have throw rugs, attach them to the floor with carpet tape. Make sure that  you have a light switch at the top of the stairs and the bottom of the stairs. If you do not have them, ask someone to add them for you. What else can I do to help prevent falls? Wear shoes that: Do not have high heels. Have rubber bottoms. Are comfortable and fit you well. Are closed at the toe. Do not wear sandals. If you use a stepladder: Make sure that it is fully opened. Do not climb a closed stepladder. Make sure that both sides of the stepladder are locked into place. Ask someone to hold it for you, if possible. Clearly mark and make sure that you can see: Any grab bars or handrails. First and last steps. Where the edge of each step is. Use tools that help you move around (mobility aids) if they are  needed. These include: Canes. Walkers. Scooters. Crutches. Turn on the lights when you go into a dark area. Replace any light bulbs as soon as they burn out. Set up your furniture so you have a clear path. Avoid moving your furniture around. If any of your floors are uneven, fix them. If there are any pets around you, be aware of where they are. Review your medicines with your doctor. Some medicines can make you feel dizzy. This can increase your chance of falling. Ask your doctor what other things that you can do to help prevent falls. This information is not intended to replace advice given to you by your health care provider. Make sure you discuss any questions you have with your health care provider. Document Released: 02/21/2009 Document Revised: 10/03/2015 Document Reviewed: 06/01/2014 Elsevier Interactive Patient Education  2017 [employer].

## 2022-08-13 NOTE — Progress Notes (Signed)
Subjective:   Maria Sweeney is a 72 y.o. female who presents for Medicare Annual (Subsequent) preventive examination.  I connected with  [name] on 08/13/22 by a telephone enabled telemedicine application and verified that I am speaking with the correct person using two identifiers.   I discussed the limitations of evaluation and management by telemedicine. The patient expressed understanding and agreed to proceed.  Patient location: home  Provider location: telephone/home   Review of Systems     Cardiac Risk Factors include: advanced age (>62men, >29 women);family history of premature cardiovascular disease;hypertension     Objective:    Today's Vitals   There is no height or weight on file to calculate BMI.     08/13/2022    1:38 PM 09/05/2021   11:09 AM 11/20/2020   11:37 AM 10/23/2020   10:55 AM 03/07/2020    4:19 PM 12/20/2019    9:21 AM 10/26/2019    9:13 AM  Advanced Directives  Does Patient Have a Medical Advance Directive? No No No No No No No  Does patient want to make changes to medical advance directive?       Yes (ED - Information included in AVS)  Would patient like information on creating a medical advance directive? No - Patient declined Yes (ED - Information included in AVS) No - Patient declined No - Patient declined   No - Patient declined    Current Medications (verified) Outpatient Encounter Medications as of 08/13/2022  Medication Sig   albuterol (PROVENTIL HFA;VENTOLIN HFA) 108 (90 Base) MCG/ACT inhaler Inhale 2 puffs into the lungs every 4 (four) hours as needed for wheezing or shortness of breath (cough, shortness of breath or wheezing.).   dorzolamide-timolol (COSOPT) 22.3-6.8 MG/ML ophthalmic solution    famotidine (PEPCID) 40 MG tablet TAKE 1 TABLET BY MOUTH DAILY   FLUoxetine (PROZAC) 20 MG capsule TAKE THREE CAPSULES BY MOUTH DAILY   latanoprost (XALATAN) 0.005 % ophthalmic solution 1 drop at bedtime.   methylcellulose (CITRUCEL) oral  powder Take 1 packet by mouth daily.   Omega-3 Fatty Acids (FISH OIL OMEGA-3) 1000 MG CAPS Take by mouth.   oxybutynin (DITROPAN-XL) 10 MG 24 hr tablet Take 10 mg by mouth at bedtime.   pantoprazole (PROTONIX) 40 MG tablet TAKE ONE TABLET BY MOUTH TWICE (2) DAILY BEFORE A MEAL   rosuvastatin (CRESTOR) 10 MG tablet TAKE ONE TABLET BY MOUTH DAILY   spironolactone (ALDACTONE) 50 MG tablet TAKE ONE TABLET BY MOUTH DAILY   VITAMIN D PO Take by mouth.   hydrALAZINE (APRESOLINE) 25 MG tablet Take 1 tablet (25 mg total) by mouth 3 (three) times daily as needed. (Patient not taking: Reported on 03/20/2022)   meloxicam (MOBIC) 15 MG tablet TAKE 1 TABLET BY MOUTH DAILY (Patient not taking: Reported on 03/20/2022)   methylPREDNISolone (MEDROL DOSEPAK) 4 MG TBPK tablet Take 4 mg by mouth. (Patient not taking: Reported on 03/20/2022)   mirabegron ER (MYRBETRIQ) 25 MG TB24 tablet Take 1 tablet (25 mg total) by mouth daily. (Patient not taking: Reported on 03/20/2022)   mirtazapine (REMERON) 7.5 MG tablet Take 1 tablet (7.5 mg total) by mouth at bedtime as needed. (Patient not taking: Reported on 03/20/2022)   valACYclovir (VALTREX) 1000 MG tablet TAKE 2 TABLETS BY MOUTH TWICE A DAY FOR TWO DOSES (Patient not taking: Reported on 03/20/2022)   No facility-administered encounter medications on file as of 08/13/2022.    Allergies (verified) Amoxicillin, Augmentin [amoxicillin-pot clavulanate], Codeine, Depakote [divalproex sodium], Latex,  Lipitor [atorvastatin], Penicillins, Zocor [simvastatin], and Septra [sulfamethoxazole-trimethoprim]   History: Past Medical History:  Diagnosis Date   Anxiety    Arthritis    Phreesia 04/12/2020   Asthma    Cataract    Phreesia 10/25/2019   Cervical spondylosis    Complication of anesthesia    with colonoscopy woke up twice   Depression    Depression    Phreesia 04/12/2020   GERD (gastroesophageal reflux disease)    Glaucoma    Phreesia 10/25/2019   Hyperlipidemia     Hypertension    IBS (irritable bowel syndrome)    Constipation with diarrhea.   Oral herpes    Past Surgical History:  Procedure Laterality Date   ABDOMINAL HYSTERECTOMY  1978   DUB; ovaries intact.   APPENDECTOMY  1988   BREAST BIOPSY Bilateral 2009   Calcium Deposit- 1980's and also in 2009/ Right side 1995   BREAST EXCISIONAL BIOPSY Bilateral    BREAST SURGERY N/A    Phreesia 10/25/2019   CATARACT EXTRACTION W/PHACO Left 10/23/2020   Procedure: CATARACT EXTRACTION PHACO AND INTRAOCULAR LENS PLACEMENT (IOC) LEFT TORIC LENS KAHOOK DUAL BLADE GONIOTOMY,  VISION BLUE 7.69 00:53.0;  Surgeon: Leandrew Koyanagi, MD;  Location: [place];  Service: Ophthalmology;  Laterality: Left;   CATARACT EXTRACTION W/PHACO Right 11/20/2020   Procedure: CATARACT EXTRACTION PHACO AND INTRAOCULAR LENS PLACEMENT ([place]) RIGHT LENS KAHOOK DUAL BLADE GONIOTOMY VISION BLUE 8.58 01:24.9;  Surgeon: Leandrew Koyanagi, MD;  Location: [place];  Service: Ophthalmology;  Laterality: Right;  latex   COLONOSCOPY  05/11/2009   Normal.  Previous colonoscopy with polyps. [place].     COLONOSCOPY WITH PROPOFOL N/A 12/20/2019   Procedure: COLONOSCOPY WITH PROPOFOL;  Surgeon: Lin Landsman, MD;  Location[facility];  Service: Gastroenterology;  Laterality: N/A;   DEBRIDEMENT TENNIS ELBOW     ROTATOR CUFF REPAIR     TONSILLECTOMY     UPPER GASTROINTESTINAL ENDOSCOPY     Family History  Problem Relation Age of Onset   Hyperlipidemia Mother    Hypertension Mother    Cancer Mother        lung   Diabetes Mother    Glaucoma Mother    Stroke Mother 62   Heart disease Mother    Asthma Father    Emphysema Father    COPD Father    Glaucoma Father    Hypertension Sister    Colon cancer Maternal Aunt    Breast cancer Paternal Grandmother    Stomach cancer Neg Hx    Esophageal cancer Neg Hx    Rectal cancer Neg Hx    Social History   Socioeconomic History   Marital status:  Divorced    Spouse name: Not on file   Number of children: 2   Years of education: Bachelors   Highest education level: Not on file  Occupational History   Occupation: Chemical engineer: [employer]    Comment: also works at [employer]   Occupation: retired  Tobacco Use   Smoking status: Never   Smokeless tobacco: Never  Vaping Use   Vaping Use: Never used  Substance and Sexual Activity   Alcohol use: No   Drug use: Never   Sexual activity: Yes    Partners: Male    Birth control/protection: Surgical, Post-menopausal  Other Topics Concern   Not on file  Social History Narrative   Marital status: divorced since 1999; dating seriously in 2019 since 2015.  Children: one son, one daughter; 2 grandchildren.      Lives: alone in house with 2 dogs     Employment:  Retired.  Needs to work in 2019.      Tobacco: none      Alcohol: rare      Drugs: none      Exercise:  Sporadic   Right-handed.   Occasional caffeine use.      ADLs: independent with ADLs; drives; no assistant devices.      Advanced Directives: none; FULL CODE.   Social Determinants of Health   Financial Resource Strain: Low Risk  (08/13/2022)   Overall Financial Resource Strain (CARDIA)    Difficulty of Paying Living Expenses: Not hard at all  Food Insecurity: No Food Insecurity (08/13/2022)   Hunger Vital Sign    Worried About Running Out of Food in the Last Year: Never true    Ran Out of Food in the Last Year: Never true  Transportation Needs: No Transportation Needs (08/13/2022)   PRAPARE - Hydrologist (Medical): No    Lack of Transportation (Non-Medical): No  Physical Activity: Sufficiently Active (08/13/2022)   Exercise Vital Sign    Days of Exercise per Week: 4 days    Minutes of Exercise per Session: 40 min  Stress: Stress Concern Present (08/13/2022)   [place]    Feeling of Stress : To some extent   Social Connections: Moderately Isolated (08/13/2022)   Social Connection and Isolation Panel [NHANES]    Frequency of Communication with Friends and Family: More than three times a week    Frequency of Social Gatherings with Friends and Family: More than three times a week    Attends Religious Services: More than 4 times per year    Active Member of [employer] or Organizations: No    Attends Music therapist: Never    Marital Status: Divorced    Tobacco Counseling Counseling given: Not Answered   Clinical Intake:  Pre-visit preparation completed: Yes  Pain : No/denies pain        How often do you need to have someone help you when you read instructions, pamphlets, or other written materials from your doctor or pharmacy?: 1 - Never  Diabetic?  no  Interpreter Needed?: No  Information entered by :: Leroy Kennedy LPN   Activities of Daily Living    08/13/2022    1:37 PM 09/05/2021   11:09 AM  In your present state of health, do you have any difficulty performing the following activities:  Hearing? 0 0  Vision? 0 0  Difficulty concentrating or making decisions? 0 0  Walking or climbing stairs? 1 0  Dressing or bathing? 0 0  Doing errands, shopping? 0 0  Preparing Food and eating ? N   Using the Toilet? N   In the past six months, have you accidently leaked urine? Y   Do you have problems with loss of bowel control? N   Managing your Medications? N   Managing your Finances? N   Housekeeping or managing your Housekeeping? N     Patient Care Team: Wendie Agreste, MD as PCP - General (Family Medicine) Edythe Clarity, [facility] as Pharmacist (Pharmacist)  Indicate any recent Medical Services you may have received from other than [facility] providers in the past year (date may be approximate).     Assessment:   This is a routine wellness examination for  Maria Sweeney.  Hearing/Vision screen Hearing Screening - Comments:: No trouble hearing  Vision Screening -  Comments:: Up to date [place]   Dietary issues and exercise activities discussed: Current Exercise Habits: Structured exercise class, Time (Minutes): 30, Frequency (Times/Week): 4, Weekly Exercise (Minutes/Week): 120, Intensity: Mild   Goals Addressed             This Visit's Progress    Exercise 3x per week (30 min per time)   Not on track    Patient states that she wants to try to start exercising more on a consistent basis.      Patient Stated   Not on track    To continue loose weight.     Weight (lb) < 200 lb (90.7 kg)         Depression Screen    08/13/2022    1:42 PM 12/05/2021   10:06 AM 09/05/2021   11:07 AM 08/11/2021   11:16 AM 10/17/2020    4:43 PM 06/13/2020   10:59 AM 04/12/2020    2:38 PM  PHQ 2/9 Scores  PHQ - 2 Score 2 0 2 2 1  0 0  PHQ- 9 Score 6 2 4 6 2       Fall Risk    08/13/2022    1:32 PM 12/05/2021   10:06 AM 09/05/2021   11:07 AM 08/11/2021   11:15 AM 04/08/2021   11:05 AM  Fall Risk   Falls in the past year? 0 0 0 0 0  Number falls in past yr: 0 0 0 0 0  Injury with Fall? 0 0 0 0 0  Risk for fall due to :  No Fall Risks No Fall Risks No Fall Risks No Fall Risks  Follow up Falls evaluation completed;Education provided;Falls prevention discussed Falls evaluation completed Falls evaluation completed Falls evaluation completed Falls evaluation completed    FALL RISK PREVENTION PERTAINING TO THE HOME:  Any stairs in or around the home? No  If so, are there any without handrails? No  Home free of loose throw rugs in walkways, pet beds, electrical cords, etc? Yes  Adequate lighting in your home to reduce risk of falls? Yes   ASSISTIVE DEVICES UTILIZED TO PREVENT FALLS:  Life alert? No  Use of a cane, walker or w/c? No  Grab bars in the bathroom? Yes  Shower chair or bench in shower? No  Elevated toilet seat or a handicapped toilet? Yes   TIMED UP AND GO:  Was the test performed? No .    Cognitive Function:        08/13/2022    1:39 PM  09/05/2021   11:08 AM 10/26/2019    9:04 AM 02/10/2019   10:22 AM 10/25/2018   11:12 AM  6CIT Screen  What Year? 0 points 0 points 0 points 0 points 0 points  What month? 0 points 0 points 0 points 0 points 0 points  What time? 0 points 0 points 0 points 0 points 0 points  Count back from 20 0 points 0 points 0 points 0 points 0 points  Months in reverse 0 points 0 points 0 points 0 points 0 points  Repeat phrase 0 points 0 points 0 points 0 points 0 points  Total Score 0 points 0 points 0 points 0 points 0 points    Immunizations Immunization History  Administered Date(s) Administered   Fluad Quad(high Dose 65+) 02/10/2019   Influenza,inj,Quad PF,6+ Mos 02/22/2015, 02/11/2016, 02/10/2017, 02/28/2018   Influenza-Unspecified 04/01/2020, 03/11/2021  PFIZER(Purple Top)SARS-COV-2 Vaccination 07/27/2019, 08/22/2019   Pneumococcal Conjugate-13 04/26/2015   Pneumococcal Polysaccharide-23 05/12/2016    TDAP status: Due, Education has been provided regarding the importance of this vaccine. Advised may receive this vaccine at local pharmacy or Health Dept. Aware to provide a copy of the vaccination record if obtained from local pharmacy or Health Dept. Verbalized acceptance and understanding.  Flu Vaccine status: Up to date  Pneumococcal vaccine status: Up to date  Covid-19 vaccine status: Information provided on how to obtain vaccines.   Qualifies for Shingles Vaccine? Yes   Zostavax completed No   Shingrix Completed?: No.    Education has been provided regarding the importance of this vaccine. Patient has been advised to call insurance company to determine out of pocket expense if they have not yet received this vaccine. Advised may also receive vaccine at local pharmacy or Health Dept. Verbalized acceptance and understanding.  Screening Tests Health Maintenance  Topic Date Due   DTaP/Tdap/Td (1 - Tdap) Never done   COVID-19 Vaccine (3 - Pfizer risk series) 08/29/2022 (Originally  09/19/2019)   Zoster Vaccines- Shingrix (1 of 2) 11/12/2022 (Originally 02/13/1970)   INFLUENZA VACCINE  12/10/2022   MAMMOGRAM  05/16/2023   Medicare Annual Wellness (AWV)  08/13/2023   COLONOSCOPY (Pts 45-40yrs Insurance coverage will need to be confirmed)  12/19/2029   Pneumonia Vaccine 63+ Years old  Completed   DEXA SCAN  Completed   Hepatitis C Screening  Completed   HPV VACCINES  Aged Out    Health Maintenance  Health Maintenance Due  Topic Date Due   DTaP/Tdap/Td (1 - Tdap) Never done    Colorectal cancer screening: Type of screening: Colonoscopy. Completed 2021. Repeat every 10 years  Mammogram scheduled   Bone Density will schedule next year with mammogram  Lung Cancer Screening: (Low Dose CT Chest recommended if Age 39-80 years, 30 pack-year currently smoking OR have quit w/in 15years.) does not qualify.   Lung Cancer Screening Referral:   Additional Screening:  Hepatitis C Screening: does not qualify; Completed 2016  Vision Screening: Recommended annual ophthalmology exams for early detection of glaucoma and other disorders of the eye. Is the patient up to date with their annual eye exam?  Yes  Who is the provider or what is the name of the office in which the patient attends annual eye exams? [place] If pt is not established with a provider, would they like to be referred to a provider to establish care? No .   Dental Screening: Recommended annual dental exams for proper oral hygiene  Community Resource Referral / Chronic Care Management: CRR required this visit?  No   CCM required this visit?  No      Plan:     I have personally reviewed and noted the following in the patient's chart:   Medical and social history Use of alcohol, tobacco or illicit drugs  Current medications and supplements including opioid prescriptions. Patient is not currently taking opioid prescriptions. Functional ability and status Nutritional status Physical  activity Advanced directives List of other physicians Hospitalizations, surgeries, and ER visits in previous 12 months Vitals Screenings to include cognitive, depression, and falls Referrals and appointments  In addition, I have reviewed and discussed with patient certain preventive protocols, quality metrics, and best practice recommendations. A written personalized care plan for preventive services as well as general preventive health recommendations were provided to patient.     Leroy Kennedy, LPN   [id number]   Nurse Notes:

## 2022-08-21 DIAGNOSIS — M5416 Radiculopathy, lumbar region: Secondary | ICD-10-CM | POA: Diagnosis not present

## 2022-08-24 DIAGNOSIS — M5459 Other low back pain: Secondary | ICD-10-CM | POA: Diagnosis not present

## 2022-08-24 DIAGNOSIS — M5416 Radiculopathy, lumbar region: Secondary | ICD-10-CM | POA: Diagnosis not present

## 2022-08-25 ENCOUNTER — Ambulatory Visit: Payer: PPO

## 2022-09-10 DIAGNOSIS — M5416 Radiculopathy, lumbar region: Secondary | ICD-10-CM | POA: Diagnosis not present

## 2022-09-10 DIAGNOSIS — M5459 Other low back pain: Secondary | ICD-10-CM | POA: Diagnosis not present

## 2022-09-14 DIAGNOSIS — N3000 Acute cystitis without hematuria: Secondary | ICD-10-CM | POA: Diagnosis not present

## 2022-09-16 DIAGNOSIS — N390 Urinary tract infection, site not specified: Secondary | ICD-10-CM | POA: Diagnosis not present

## 2022-09-21 DIAGNOSIS — M5416 Radiculopathy, lumbar region: Secondary | ICD-10-CM | POA: Diagnosis not present

## 2022-09-21 DIAGNOSIS — M5459 Other low back pain: Secondary | ICD-10-CM | POA: Diagnosis not present

## 2022-09-25 ENCOUNTER — Ambulatory Visit (INDEPENDENT_AMBULATORY_CARE_PROVIDER_SITE_OTHER): Payer: Medicare HMO | Admitting: Family Medicine

## 2022-09-25 ENCOUNTER — Other Ambulatory Visit: Payer: Self-pay

## 2022-09-25 VITALS — BP 130/70 | HR 64 | Temp 98.2°F | Wt 145.0 lb

## 2022-09-25 DIAGNOSIS — R0982 Postnasal drip: Secondary | ICD-10-CM | POA: Diagnosis not present

## 2022-09-25 DIAGNOSIS — R42 Dizziness and giddiness: Secondary | ICD-10-CM

## 2022-09-25 DIAGNOSIS — M542 Cervicalgia: Secondary | ICD-10-CM

## 2022-09-25 DIAGNOSIS — F5104 Psychophysiologic insomnia: Secondary | ICD-10-CM | POA: Diagnosis not present

## 2022-09-25 DIAGNOSIS — Z1239 Encounter for other screening for malignant neoplasm of breast: Secondary | ICD-10-CM

## 2022-09-25 DIAGNOSIS — R002 Palpitations: Secondary | ICD-10-CM | POA: Diagnosis not present

## 2022-09-25 DIAGNOSIS — R052 Subacute cough: Secondary | ICD-10-CM

## 2022-09-25 LAB — BASIC METABOLIC PANEL
BUN: 13 mg/dL (ref 6–23)
CO2: 28 mEq/L (ref 19–32)
Calcium: 10.1 mg/dL (ref 8.4–10.5)
Chloride: 104 mEq/L (ref 96–112)
Creatinine, Ser: 0.8 mg/dL (ref 0.40–1.20)
GFR: 73.51 mL/min (ref >60.00)
Glucose, Bld: 118 mg/dL — ABNORMAL HIGH (ref 70–99)
Potassium: 4.3 mEq/L (ref 3.5–5.1)
Sodium: 140 mEq/L (ref 135–145)

## 2022-09-25 LAB — CBC
HCT: 40.5 % (ref 36.0–46.0)
Hemoglobin: 14.1 g/dL (ref 12.0–15.0)
MCHC: 34.9 g/dL (ref 30.0–36.0)
MCV: 94.4 fl (ref 78.0–100.0)
Platelets: 285 10*3/uL (ref 150.0–400.0)
RBC: 4.29 Mil/uL (ref 3.87–5.11)
RDW: 13.1 % (ref 11.5–15.5)
WBC: 8.3 10*3/uL (ref 4.0–10.5)

## 2022-09-25 LAB — TSH: TSH: 1.86 u[IU]/mL (ref 0.35–5.50)

## 2022-09-25 MED ORDER — FLUTICASONE PROPIONATE 50 MCG/ACT NA SUSP: 2.0000 | Freq: Every day | NASAL | 6 refills | Status: DC

## 2022-09-25 NOTE — Patient Instructions (Addendum)
Cough could be related to previous infection but also may be from postnasal drip or heartburn.  Try Flonase nasal spray 1 to 2 sprays in each nostril once a day to see if that helps with the postnasal drip and lessens cough.  Please have chest x-ray performed at the [facility] location below.  If any concerns I will let you know.  Neck, upper back, arm symptoms could be related to the previous degenerative changes in the neck.  Gentle range of motion after warm compresses may help if there is a component of spasm but I am hesitant to start any new medications right now as it is sometimes can worsen dizziness as well.  I will refer you to a spine specialist to decide on her next step whether that may be physical therapy or other imaging. Return to the clinic or go to the nearest emergency room if any of your symptoms worsen or new symptoms occur.  [facility] or [facility]: Walk in 8:30-4:30 during weekdays, no appointment needed [address]  [place], [place] [address]  I will check some labs for dizziness, but have also referred you back to your cardiologist to discuss the symptoms as well as heart palpitations.  Follow-up in the next 2 weeks to review labs and discuss the symptoms further and next step.  Be seen here, urgent care or emergency room if any new or worsening symptoms.  We can discuss sleep and plan further at follow-up visit as well.  Thank you for coming in today.  Dizziness Dizziness is a common problem. It is a feeling of unsteadiness or light-headedness. You may feel like you are about to faint. Dizziness can lead to injury if you stumble or fall. Anyone can become dizzy, but dizziness is more common in older adults. This condition can be caused by a number of things, including medicines, dehydration, or illness. Follow these instructions at home: Eating and drinking  Drink enough fluid to keep your urine pale yellow. This helps to keep you from becoming dehydrated. Try to drink  more clear fluids, such as water. Do not drink alcohol. Limit your caffeine intake if told to do so by your health care provider. Check ingredients and nutrition facts to see if a food or beverage contains caffeine. Limit your salt (sodium) intake if told to do so by your health care provider. Check ingredients and nutrition facts to see if a food or beverage contains sodium. Activity  Avoid making quick movements. Rise slowly from chairs and steady yourself until you feel okay. In the morning, first sit up on the side of the bed. When you feel okay, stand slowly while you hold onto something until you know that your balance is good. If you need to stand in one place for a long time, move your legs often. Tighten and relax the muscles in your legs while you are standing. Do not drive or use machinery if you feel dizzy. Avoid bending down if you feel dizzy. Place items in your home so that they are easy for you to reach without leaning over. Lifestyle Do not use any products that contain nicotine or tobacco. These products include cigarettes, chewing tobacco, and vaping devices, such as e-cigarettes. If you need help quitting, ask your health care provider. Try to reduce your stress level by using methods such as yoga or meditation. Talk with your health care provider if you need help to manage your stress. General instructions Watch your dizziness for any  changes. Take over-the-counter and prescription medicines only as told by your health care provider. Talk with your health care provider if you think that your dizziness is caused by a medicine that you are taking. Tell a friend or a family member that you are feeling dizzy. If he or she notices any changes in your behavior, have this person call your health care provider. Keep all follow-up visits. This is important. Contact a health care provider if: Your dizziness does not go away or you have new symptoms. Your dizziness or light-headedness  gets worse. You feel nauseous. You have reduced hearing. You have a fever. You have neck pain or a stiff neck. Your dizziness leads to an injury or a fall. Get help right away if: You vomit or have diarrhea and are unable to eat or drink anything. You have problems talking, walking, swallowing, or using your arms, hands, or legs. You feel generally weak. You have any bleeding. You are not thinking clearly or you have trouble forming sentences. It may take a friend or family member to notice this. You have chest pain, abdominal pain, shortness of breath, or sweating. Your vision changes or you develop a severe headache. These symptoms may represent a serious problem that is an emergency. Do not wait to see if the symptoms will go away. Get medical help right away. Call your local emergency services (911 in the [place] Do not drive yourself to the hospital. Summary Dizziness is a feeling of unsteadiness or light-headedness. This condition can be caused by a number of things, including medicines, dehydration, or illness. Anyone can become dizzy, but dizziness is more common in older adults. Drink enough fluid to keep your urine pale yellow. Do not drink alcohol. Avoid making quick movements if you feel dizzy. Monitor your dizziness for any changes. This information is not intended to replace advice given to you by your health care provider. Make sure you discuss any questions you have with your health care provider. Document Revised: 04/01/2020 Document Reviewed: 04/01/2020 Elsevier Patient Education  2023 [employer]   Palpitations Palpitations are feelings that your heartbeat is irregular or is faster than normal. It may feel like your heart is fluttering or skipping a beat. Palpitations may be caused by many things, including smoking, caffeine, alcohol, stress, and certain medicines or drugs. Most causes of palpitations are not serious.  However, some palpitations can be a sign of a  serious problem. Further tests and a thorough medical history will be done to find the cause of your palpitations. Your provider may order tests such as an ECG, labs, an echocardiogram, or an ambulatory continuous ECG monitor. Follow these instructions at home: Pay attention to any changes in your symptoms. Let your health care provider know about them. Take these actions to help manage your symptoms: Eating and drinking Follow instructions from your health care provider about eating or drinking restrictions. You may need to avoid foods and drinks that may cause palpitations. These may include: Caffeinated coffee, tea, soft drinks, and energy drinks. Chocolate. Alcohol. Diet pills. Lifestyle     Take steps to reduce your stress and anxiety. Things that can help you relax include: Yoga. Mind-body activities, such as deep breathing, meditation, or using words and images to create positive thoughts (guided imagery). Physical activity, such as swimming, jogging, or walking. Tell your health care provider if your palpitations increase with activity. If you have chest pain or shortness of breath with activity, do not continue the activity until you  are seen by your health care provider. Biofeedback. This is a method that helps you learn to use your mind to control things in your body, such as your heartbeat. Get plenty of rest and sleep. Keep a regular bed time. Do not use drugs, including cocaine or ecstasy. Do not use marijuana. Do not use any products that contain nicotine or tobacco. These products include cigarettes, chewing tobacco, and vaping devices, such as e-cigarettes. If you need help quitting, ask your health care provider. General instructions Take over-the-counter and prescription medicines only as told by your health care provider. Keep all follow-up visits. This is important. These may include visits for further testing if palpitations do not go away or get worse. Contact a health  care provider if: You continue to have a fast or irregular heartbeat for a long period of time. You notice that your palpitations occur more often. Get help right away if: You have chest pain or shortness of breath. You have a severe headache. You feel dizzy or you faint. These symptoms may represent a serious problem that is an emergency. Do not wait to see if the symptoms will go away. Get medical help right away. Call your local emergency services (911 in the [place] Do not drive yourself to the hospital. Summary Palpitations are feelings that your heartbeat is irregular or is faster than normal. It may feel like your heart is fluttering or skipping a beat. Palpitations may be caused by many things, including smoking, caffeine, alcohol, stress, certain medicines, and drugs. Further tests and a thorough medical history may be done to find the cause of your palpitations. Get help right away if you faint or have chest pain, shortness of breath, severe headache, or dizziness. This information is not intended to replace advice given to you by your health care provider. Make sure you discuss any questions you have with your health care provider. Document Revised: 09/18/2020 Document Reviewed: 09/18/2020 Elsevier Patient Education  2023 [employer]    Cough, Adult Coughing is a reflex that clears your throat and airways (respiratory system). It helps heal and protect your lungs. It is normal to cough from time to time. A cough that happens with other symptoms or that lasts a long time may be a sign of a condition that needs treatment. A short-term (acute) cough may only last 2-3 weeks. A long-term (chronic) cough may last 8 or more weeks. Coughing is often caused by: Diseases, such as: An infection of the respiratory system. Asthma or other heart or lung diseases. Gastroesophageal reflux. This is when acid comes back up from the stomach. Breathing in things that irritate your  lungs. Allergies. Postnasal drip. This is when mucus runs down the back of your throat. Smoking. Some medicines. Follow these instructions at home: Medicines Take over-the-counter and prescription medicines only as told by your health care provider. Talk with your provider before you take cough medicine (cough suppressants). Eating and drinking Do not drink alcohol. Avoid caffeine. Drink enough fluid to keep your pee (urine) pale yellow. Lifestyle Avoid cigarette smoke. Do not use any products that contain nicotine or tobacco. These products include cigarettes, chewing tobacco, and vaping devices, such as e-cigarettes. If you need help quitting, ask your provider. Avoid things that make you cough. These may include perfumes, candles, cleaning products, or campfire smoke. General instructions  Watch for any changes to your cough. Tell your provider about them. Always cover your mouth when you cough. If the air is dry in your  bedroom or home, use a cool mist vaporizer or humidifier. If your cough is worse at night, try to sleep in a semi-upright position. Rest as needed. Contact a health care provider if: You have new symptoms, or your symptoms get worse. You cough up pus. You have a fever that does not go away or a cough that does not get better after 2-3 weeks. You cannot control your cough with medicine, and you are losing sleep. You have pain that gets worse or is not helped with medicine. You lose weight for no clear reason. You have night sweats. Get help right away if: You cough up blood. You have trouble breathing. Your heart is beating very fast. These symptoms may be an emergency. Get help right away. Call 911. Do not wait to see if the symptoms will go away. Do not drive yourself to the hospital. This information is not intended to replace advice given to you by your health care provider. Make sure you discuss any questions you have with your health care  provider. Document Revised: 12/26/2021 Document Reviewed: 12/26/2021 Elsevier Patient Education  2023 [employer].

## 2022-09-25 NOTE — Progress Notes (Signed)
Subjective:  Patient ID: Maria Sweeney, female    DOB: [birth year]  Age: 72 y.o. MRN: [id number]  CC:  Chief Complaint  Patient presents with   Cough    States she had a cold in November and has been dealing with a cough since. She also has been having cramping on her right side of her neck that goes all the way down to her shoulder blade. It comes and goes and last a few minutes.    HPI Maria Sweeney presents for   Cough: Started with illness in November. Congestion, deep cough, fever. Treated at home. No medical eval. Fever for few days only, congestion improved after a week, but cough has persisted. Sounds productive but unable to bring up phlegm.  More at night, sometimes during day.  No recent fever, night sweats or weight loss.  No dyspnea/wheeze. Asthma as child.  Hx of GERD- still some epidoic flare, but infrequent, avoids triggers, uses protonix BID, pepcid at night. Endoscopy few months ago - followed by GI.  Some PND, no recent allergy meds, no nasal spray. Mouth dry and hoarse at times.  Frequent throat clearing. Some allergy symptoms in past/hay fever.    Neck Pain: Notes in R neck to shoulder blade, wraps forward at times. Similar sx's in January - quickly resolved. Random quick flares that resolve. More frequent past few weeks. Lasts a minute or so. No injury/fall/activity changes. Sometimes radiates down R arm - sharp pain. Lasts 3-4 minutes. No prior neck injection/surgery. Hx of cervical degenerative changes.  [facility] 08/11/21 -  Cervical spondylosis with encroachment of neural foramina from C3-T1 levels, more so at C4-C5 and C5-C6 levels. In PT for back and hip.   Dizziness:  Noted at end of visit. Has been present for years. Seems to be getting a little worse. Not sure it is from meds. Notices with standing up at times, unsteady during the day. Left arm numbness with carpal tunnel, but no focal weakness. No speech change or face droop. No syncope. No new HA. No  falls.  No melena or hematochezia.  No chest pain.  Irregular heart beats or skipped beats at night past few weeks. Cardiologist - Dr. Mariah Milling Last visit 01/2022. EKG with sinus bradycardia.  Chronic difficulty with sleep.   Lab Results  Component Value Date   NA 141 09/12/2021   K 4.5 09/12/2021   CL 104 09/12/2021   CO2 28 09/12/2021   Lab Results  Component Value Date   WBC 9.9 03/07/2020   HGB 13.5 03/07/2020   HCT 40.0 03/07/2020   MCV 94.1 03/07/2020   PLT 246 03/07/2020   BP Readings from Last 3 Encounters:  09/25/22 130/70  06/19/22 (!) 128/48  03/20/22 132/84     History Patient Active Problem List   Diagnosis Date Noted   Encounter for screening colonoscopy    Vitamin D deficiency 08/02/2018   Prediabetes 08/02/2018   Carpal tunnel syndrome 02/10/2018   Tinnitus 06/25/2016   Numbness 04/13/2016   Loss of smell 04/13/2016   Pure hypercholesterolemia 11/30/2014   Degenerative disc disease, cervical 04/24/2014   Essential hypertension, benign 10/19/2013   Insomnia 10/19/2013   Herpes labialis 10/19/2013   Anxiety and depression 07/25/2012   GERD (gastroesophageal reflux disease) 07/25/2012   Past Medical History:  Diagnosis Date   Anxiety    Arthritis    Phreesia 04/12/2020   Asthma    Cataract    Phreesia 10/25/2019   Cervical spondylosis  Complication of anesthesia    with colonoscopy woke up twice   Depression    Depression    Phreesia 04/12/2020   GERD (gastroesophageal reflux disease)    Glaucoma    Phreesia 10/25/2019   Hyperlipidemia    Hypertension    IBS (irritable bowel syndrome)    Constipation with diarrhea.   Oral herpes    Past Surgical History:  Procedure Laterality Date   ABDOMINAL HYSTERECTOMY  1978   DUB; ovaries intact.   APPENDECTOMY  1988   BREAST BIOPSY Bilateral 2009   Calcium Deposit- 1980's and also in 2009/ Right side 1995   BREAST EXCISIONAL BIOPSY Bilateral    BREAST SURGERY N/A    Phreesia 10/25/2019    CATARACT EXTRACTION W/PHACO Left 10/23/2020   Procedure: CATARACT EXTRACTION PHACO AND INTRAOCULAR LENS PLACEMENT (IOC) LEFT TORIC LENS KAHOOK DUAL BLADE GONIOTOMY,  VISION BLUE 7.69 00:53.0;  Surgeon: Lockie Mola, MD;  Location: [facility];  Service: Ophthalmology;  Laterality: Left;   CATARACT EXTRACTION W/PHACO Right 11/20/2020   Procedure: CATARACT EXTRACTION PHACO AND INTRAOCULAR LENS PLACEMENT (IOC) RIGHT LENS KAHOOK DUAL BLADE GONIOTOMY VISION BLUE 8.58 01:24.9;  Surgeon: Lockie Mola, MD;  Location: [facility];  Service: Ophthalmology;  Laterality: Right;  latex   COLONOSCOPY  05/11/2009   Normal.  Previous colonoscopy with polyps. [facility].     COLONOSCOPY WITH PROPOFOL N/A 12/20/2019   Procedure: COLONOSCOPY WITH PROPOFOL;  Surgeon: Toney Reil, MD;  Location: [facility];  Service: Gastroenterology;  Laterality: N/A;   DEBRIDEMENT TENNIS ELBOW     ROTATOR CUFF REPAIR     TONSILLECTOMY     UPPER GASTROINTESTINAL ENDOSCOPY     Allergies  Allergen Reactions   Amoxicillin Anaphylaxis   Augmentin [Amoxicillin-Pot Clavulanate]     Numbness around mouth, throat tightness   Codeine Itching   Depakote [Divalproex Sodium]     hives   Latex Hives   Lipitor [Atorvastatin]     Muscles pain   Penicillins     Throat tightness, mouth numb   Zocor [Simvastatin]     Muscle pain   Septra [Sulfamethoxazole-Trimethoprim] Rash   Prior to Admission medications   Medication Sig Start Date End Date Taking? Authorizing Provider  albuterol (PROVENTIL HFA;VENTOLIN HFA) 108 (90 Base) MCG/ACT inhaler Inhale 2 puffs into the lungs every 4 (four) hours as needed for wheezing or shortness of breath (cough, shortness of breath or wheezing.). 02/11/16  Yes Ethelda Chick, MD  dorzolamide-timolol (COSOPT) 22.3-6.8 MG/ML ophthalmic solution  12/25/19  Yes [provider]  famotidine (PEPCID) 40 MG tablet TAKE 1 TABLET BY MOUTH DAILY 05/25/22  Yes Shade Flood, MD  FLUoxetine (PROZAC) 20 MG capsule TAKE THREE CAPSULES BY MOUTH DAILY 08/03/22  Yes Shade Flood, MD  latanoprost (XALATAN) 0.005 % ophthalmic solution 1 drop at bedtime.   Yes [provider]  methylcellulose (CITRUCEL) oral powder Take 1 packet by mouth daily. 03/20/22  Yes Armbruster, Willaim Rayas, MD  Omega-3 Fatty Acids (FISH OIL OMEGA-3) 1000 MG CAPS Take by mouth.   Yes [provider]  oxybutynin (DITROPAN-XL) 10 MG 24 hr tablet Take 10 mg by mouth at bedtime. 05/23/22  Yes [provider]  pantoprazole (PROTONIX) 40 MG tablet TAKE ONE TABLET BY MOUTH TWICE (2) DAILY BEFORE A MEAL 08/03/22  Yes Armbruster, Willaim Rayas, MD  rosuvastatin (CRESTOR) 10 MG tablet TAKE ONE TABLET BY MOUTH DAILY 08/10/22  Yes Shade Flood, MD  spironolactone (ALDACTONE) 50 MG tablet  TAKE ONE TABLET BY MOUTH DAILY 08/10/22  Yes Shade Flood, MD  VITAMIN D PO Take by mouth.   Yes [provider]  hydrALAZINE (APRESOLINE) 25 MG tablet Take 1 tablet (25 mg total) by mouth 3 (three) times daily as needed. Patient not taking: Reported on 03/20/2022 06/19/20   Antonieta Iba, MD  meloxicam (MOBIC) 15 MG tablet TAKE 1 TABLET BY MOUTH DAILY Patient not taking: Reported on 03/20/2022 11/28/21   Shade Flood, MD  methylPREDNISolone (MEDROL DOSEPAK) 4 MG TBPK tablet Take 4 mg by mouth. Patient not taking: Reported on 03/20/2022 09/22/21   [provider]  mirabegron ER (MYRBETRIQ) 25 MG TB24 tablet Take 1 tablet (25 mg total) by mouth daily. Patient not taking: Reported on 03/20/2022 12/05/21   Shade Flood, MD  mirtazapine (REMERON) 7.5 MG tablet Take 1 tablet (7.5 mg total) by mouth at bedtime as needed. Patient not taking: Reported on 03/20/2022 12/07/19   Lezlie Lye, Meda Coffee, MD  valACYclovir (VALTREX) 1000 MG tablet TAKE 2 TABLETS BY MOUTH TWICE A DAY FOR TWO DOSES Patient not taking: Reported on 03/20/2022 08/22/21   Janeece Agee, NP   Social  History   Socioeconomic History   Marital status: Divorced    Spouse name: Not on file   Number of children: 2   Years of education: Bachelors   Highest education level: Not on file  Occupational History   Occupation: Airline pilot ASSOCIATE    Employer: [employer]    Comment: also works at [employer].   Occupation: retired  Tobacco Use   Smoking status: Never   Smokeless tobacco: Never  Vaping Use   Vaping Use: Never used  Substance and Sexual Activity   Alcohol use: No   Drug use: Never   Sexual activity: Yes    Partners: Male    Birth control/protection: Surgical, Post-menopausal  Other Topics Concern   Not on file  Social History Narrative   Marital status: divorced since 1999; dating seriously in 2019 since 2015.      Children: one son, one daughter; 2 grandchildren.      Lives: alone in house with 2 dogs     Employment:  Retired.  Needs to work in 2019.      Tobacco: none      Alcohol: rare      Drugs: none      Exercise:  Sporadic   Right-handed.   Occasional caffeine use.      ADLs: independent with ADLs; drives; no assistant devices.      Advanced Directives: none; FULL CODE.   Social Determinants of Health   Financial Resource Strain: Low Risk  (08/13/2022)   Overall Financial Resource Strain (CARDIA)    Difficulty of Paying Living Expenses: Not hard at all  Food Insecurity: No Food Insecurity (08/13/2022)   Hunger Vital Sign    Worried About Running Out of Food in the Last Year: Never true    Ran Out of Food in the Last Year: Never true  Transportation Needs: No Transportation Needs (08/13/2022)   PRAPARE - Administrator, Civil Service (Medical): No    Lack of Transportation (Non-Medical): No  Physical Activity: Sufficiently Active (08/13/2022)   Exercise Vital Sign    Days of Exercise per Week: 4 days    Minutes of Exercise per Session: 40 min  Stress: Stress Concern Present (08/13/2022)   [employer] of Occupational Health - Occupational Stress  Questionnaire    Feeling  of Stress : To some extent  Social Connections: Moderately Isolated (08/13/2022)   Social Connection and Isolation Panel [NHANES]    Frequency of Communication with Friends and Family: More than three times a week    Frequency of Social Gatherings with Friends and Family: More than three times a week    Attends Religious Services: More than 4 times per year    Active Member of [employer] or Organizations: No    Attends Banker Meetings: Never    Marital Status: Divorced  Catering manager Violence: Not At Risk (08/13/2022)   Humiliation, Afraid, Rape, and Kick questionnaire    Fear of Current or Ex-Partner: No    Emotionally Abused: No    Physically Abused: No    Sexually Abused: No    Review of Systems   Objective:   Vitals:   09/25/22 1005  BP: 130/70  Pulse: 64  Temp: 98.2 F (36.8 C)  SpO2: 94%  Weight: 145 lb (65.8 kg)     Physical Exam Vitals reviewed.  Constitutional:      Appearance: Normal appearance. She is well-developed.  HENT:     Head: Normocephalic and atraumatic.     Nose: No congestion or rhinorrhea.  Eyes:     Conjunctiva/sclera: Conjunctivae normal.     Pupils: Pupils are equal, round, and reactive to light.  Neck:     Vascular: No carotid bruit.  Cardiovascular:     Rate and Rhythm: Normal rate and regular rhythm.     Heart sounds: Normal heart sounds.  Pulmonary:     Effort: Pulmonary effort is normal.     Breath sounds: Normal breath sounds.  Abdominal:     Palpations: Abdomen is soft. There is no pulsatile mass.     Tenderness: There is no abdominal tenderness.  Musculoskeletal:     Right lower leg: No edema.     Left lower leg: No edema.     Comments: C-spine, no midline bony tenderness, no bony tenderness of shoulder or arm.  Pain-free range of motion of C-spine, shoulders.  Did have decreased extension, rotation and lateral flexion of C-spine, equal bilaterally.  Did not reproduce symptoms with range of  motion.  Equal upper extremity strength, reflexes 2+ and equal bilaterally.  Skin:    General: Skin is warm and dry.  Neurological:     Mental Status: She is alert and oriented to person, place, and time.     GCS: GCS eye subscore is 4. GCS verbal subscore is 5. GCS motor subscore is 6.     Cranial Nerves: No dysarthria or facial asymmetry.     Sensory: Sensation is intact.     Motor: Motor function is intact. No weakness, tremor or pronator drift.     Coordination: Coordination is intact. Romberg sign negative. Finger-Nose-Finger Test and Heel to [place] Test normal.     Gait: Gait is intact.     Comments: Nonfocal exam  Psychiatric:        Mood and Affect: Mood normal.        Behavior: Behavior normal.   EKG: Sinus bradycardia, rate 53, no acute ST or T wave changes.  Compared to EKG 01/14/22 - Rate 57 at that time, no significant changes.  Over 49 minutes spent during visit, including chart review, discussion of additional concerns including dizziness, counseling and assimilation of information, exam, discussion of plan, and chart completion.  Time excludes evaluation/interpretation of EKG.   Assessment & Plan:  Maria Sweeney  is a 72 y.o. female . Neck pain - Plan: Ambulatory referral to Spine Surgery  -With radiating pain to shoulder blade, right arm, chest wall.  History of cervical degenerative disease, possible neuronal impingement, intermittent fleeting symptoms.  Hold on new meds for now but will refer to spine specialist to decide on PT, advanced imaging or other treatments.  Gentle range of motion, stretches, heat if needed for now.  RTC precautions.  Subacute cough - Plan: DG Chest 2 View Postnasal drip - Plan: fluticasone (FLONASE) 50 MCG/ACT nasal spray  -Persistent cough after infection 6 months ago, suspect component of postnasal drip, upper airway cough syndrome or could be some irritation with persistent reflux, currently on twice daily PPI as well as H2 blocker.  Check  chest x-ray, trial of Flonase nasal spray, continue reflux treatment.  If symptoms persist would recommend she discuss with gastroenterology and likely pulmonary eval.  Dizziness - Plan: EKG 12-Lead, CBC, Basic metabolic panel  -Longstanding symptoms, no acute findings on EKG but does have sinus bradycardia.  Potentially could be related to medications.  Check CBC, electrolytes, recheck in 2 weeks to discuss next step.  Consider neuro eval if persistent unsteadiness with ER precautions if acute changes.  Nonfocal exam at present.  Palpitations - Plan: EKG 12-Lead, Basic metabolic panel, TSH, Ambulatory referral to Cardiology  -Past 2 weeks, nighttime infrequent.  Asymptomatic currently, no concerning findings or acute findings on EKG.  Check labs as above, refer to cardiology.  Recheck 2 weeks.  ER precautions.  Handout given.  Chronic insomnia may be contributing, plan to discuss further next visit  Chronic insomnia  -Denies recent changes, has been treated with mirtazapine previously.  Will discuss further at follow-up in 2 weeks.  Meds ordered this encounter  Medications   fluticasone (FLONASE) 50 MCG/ACT nasal spray    Sig: Place 2 sprays into both nostrils daily.    Dispense:  16 g    Refill:  6   Patient Instructions  Cough could be related to previous infection but also may be from postnasal drip or heartburn.  Try Flonase nasal spray 1 to 2 sprays in each nostril once a day to see if that helps with the postnasal drip and lessens cough.  Please have chest x-ray performed at the [facility] location below.  If any concerns I will let you know.  Neck, upper back, arm symptoms could be related to the previous degenerative changes in the neck.  Gentle range of motion after warm compresses may help if there is a component of spasm but I am hesitant to start any new medications right now as it is sometimes can worsen dizziness as well.  I will refer you to a spine specialist to decide on her  next step whether that may be physical therapy or other imaging. Return to the clinic or go to the nearest emergency room if any of your symptoms worsen or new symptoms occur.  [facility] or xray: Walk in 8:30-4:30 during weekdays, no appointment needed [address]  [place], [place] [address]  I will check some labs for dizziness, but have also referred you back to your cardiologist to discuss the symptoms as well as heart palpitations.  Follow-up in the next 2 weeks to review labs and discuss the symptoms further and next step.  Be seen here, urgent care or emergency room if any new or worsening symptoms.  We can discuss sleep and plan further at follow-up visit as well.  Thank you for coming in today.  Dizziness Dizziness is a common problem. It is a feeling of unsteadiness or light-headedness. You may feel like you are about to faint. Dizziness can lead to injury if you stumble or fall. Anyone can become dizzy, but dizziness is more common in older adults. This condition can be caused by a number of things, including medicines, dehydration, or illness. Follow these instructions at home: Eating and drinking  Drink enough fluid to keep your urine pale yellow. This helps to keep you from becoming dehydrated. Try to drink more clear fluids, such as water. Do not drink alcohol. Limit your caffeine intake if told to do so by your health care provider. Check ingredients and nutrition facts to see if a food or beverage contains caffeine. Limit your salt (sodium) intake if told to do so by your health care provider. Check ingredients and nutrition facts to see if a food or beverage contains sodium. Activity  Avoid making quick movements. Rise slowly from chairs and steady yourself until you feel okay. In the morning, first sit up on the side of the bed. When you feel okay, stand slowly while you hold onto something until you know that your balance is good. If you need to stand in one place for  a long time, move your legs often. Tighten and relax the muscles in your legs while you are standing. Do not drive or use machinery if you feel dizzy. Avoid bending down if you feel dizzy. Place items in your home so that they are easy for you to reach without leaning over. Lifestyle Do not use any products that contain nicotine or tobacco. These products include cigarettes, chewing tobacco, and vaping devices, such as e-cigarettes. If you need help quitting, ask your health care provider. Try to reduce your stress level by using methods such as yoga or meditation. Talk with your health care provider if you need help to manage your stress. General instructions Watch your dizziness for any changes. Take over-the-counter and prescription medicines only as told by your health care provider. Talk with your health care provider if you think that your dizziness is caused by a medicine that you are taking. Tell a friend or a family member that you are feeling dizzy. If he or she notices any changes in your behavior, have this person call your health care provider. Keep all follow-up visits. This is important. Contact a health care provider if: Your dizziness does not go away or you have new symptoms. Your dizziness or light-headedness gets worse. You feel nauseous. You have reduced hearing. You have a fever. You have neck pain or a stiff neck. Your dizziness leads to an injury or a fall. Get help right away if: You vomit or have diarrhea and are unable to eat or drink anything. You have problems talking, walking, swallowing, or using your arms, hands, or legs. You feel generally weak. You have any bleeding. You are not thinking clearly or you have trouble forming sentences. It may take a friend or family member to notice this. You have chest pain, abdominal pain, shortness of breath, or sweating. Your vision changes or you develop a severe headache. These symptoms may represent a serious problem  that is an emergency. Do not wait to see if the symptoms will go away. Get medical help right away. Call your local emergency services (911 in the [place] Do not drive yourself to the hospital. Summary Dizziness is a feeling of unsteadiness or light-headedness. This  condition can be caused by a number of things, including medicines, dehydration, or illness. Anyone can become dizzy, but dizziness is more common in older adults. Drink enough fluid to keep your urine pale yellow. Do not drink alcohol. Avoid making quick movements if you feel dizzy. Monitor your dizziness for any changes. This information is not intended to replace advice given to you by your health care provider. Make sure you discuss any questions you have with your health care provider. Document Revised: 04/01/2020 Document Reviewed: 04/01/2020 Elsevier Patient Education  2023 Elsevier Inc.   Palpitations Palpitations are feelings that your heartbeat is irregular or is faster than normal. It may feel like your heart is fluttering or skipping a beat. Palpitations may be caused by many things, including smoking, caffeine, alcohol, stress, and certain medicines or drugs. Most causes of palpitations are not serious.  However, some palpitations can be a sign of a serious problem. Further tests and a thorough medical history will be done to find the cause of your palpitations. Your provider may order tests such as an ECG, labs, an echocardiogram, or an ambulatory continuous ECG monitor. Follow these instructions at home: Pay attention to any changes in your symptoms. Let your health care provider know about them. Take these actions to help manage your symptoms: Eating and drinking Follow instructions from your health care provider about eating or drinking restrictions. You may need to avoid foods and drinks that may cause palpitations. These may include: Caffeinated coffee, tea, soft drinks, and energy drinks. Chocolate. Alcohol. Diet  pills. Lifestyle     Take steps to reduce your stress and anxiety. Things that can help you relax include: Yoga. Mind-body activities, such as deep breathing, meditation, or using words and images to create positive thoughts (guided imagery). Physical activity, such as swimming, jogging, or walking. Tell your health care provider if your palpitations increase with activity. If you have chest pain or shortness of breath with activity, do not continue the activity until you are seen by your health care provider. Biofeedback. This is a method that helps you learn to use your mind to control things in your body, such as your heartbeat. Get plenty of rest and sleep. Keep a regular bed time. Do not use drugs, including cocaine or ecstasy. Do not use marijuana. Do not use any products that contain nicotine or tobacco. These products include cigarettes, chewing tobacco, and vaping devices, such as e-cigarettes. If you need help quitting, ask your health care provider. General instructions Take over-the-counter and prescription medicines only as told by your health care provider. Keep all follow-up visits. This is important. These may include visits for further testing if palpitations do not go away or get worse. Contact a health care provider if: You continue to have a fast or irregular heartbeat for a long period of time. You notice that your palpitations occur more often. Get help right away if: You have chest pain or shortness of breath. You have a severe headache. You feel dizzy or you faint. These symptoms may represent a serious problem that is an emergency. Do not wait to see if the symptoms will go away. Get medical help right away. Call your local emergency services (911 in the [place] Do not drive yourself to the hospital. Summary Palpitations are feelings that your heartbeat is irregular or is faster than normal. It may feel like your heart is fluttering or skipping a beat. Palpitations  may be caused by many things, including smoking, caffeine, alcohol, stress,  certain medicines, and drugs. Further tests and a thorough medical history may be done to find the cause of your palpitations. Get help right away if you faint or have chest pain, shortness of breath, severe headache, or dizziness. This information is not intended to replace advice given to you by your health care provider. Make sure you discuss any questions you have with your health care provider. Document Revised: 09/18/2020 Document Reviewed: 09/18/2020 Elsevier Patient Education  2023 Elsevier Inc.    Cough, Adult Coughing is a reflex that clears your throat and airways (respiratory system). It helps heal and protect your lungs. It is normal to cough from time to time. A cough that happens with other symptoms or that lasts a long time may be a sign of a condition that needs treatment. A short-term (acute) cough may only last 2-3 weeks. A long-term (chronic) cough may last 8 or more weeks. Coughing is often caused by: Diseases, such as: An infection of the respiratory system. Asthma or other heart or lung diseases. Gastroesophageal reflux. This is when acid comes back up from the stomach. Breathing in things that irritate your lungs. Allergies. Postnasal drip. This is when mucus runs down the back of your throat. Smoking. Some medicines. Follow these instructions at home: Medicines Take over-the-counter and prescription medicines only as told by your health care provider. Talk with your provider before you take cough medicine (cough suppressants). Eating and drinking Do not drink alcohol. Avoid caffeine. Drink enough fluid to keep your pee (urine) pale yellow. Lifestyle Avoid cigarette smoke. Do not use any products that contain nicotine or tobacco. These products include cigarettes, chewing tobacco, and vaping devices, such as e-cigarettes. If you need help quitting, ask your provider. Avoid things that  make you cough. These may include perfumes, candles, cleaning products, or campfire smoke. General instructions  Watch for any changes to your cough. Tell your provider about them. Always cover your mouth when you cough. If the air is dry in your bedroom or home, use a cool mist vaporizer or humidifier. If your cough is worse at night, try to sleep in a semi-upright position. Rest as needed. Contact a health care provider if: You have new symptoms, or your symptoms get worse. You cough up pus. You have a fever that does not go away or a cough that does not get better after 2-3 weeks. You cannot control your cough with medicine, and you are losing sleep. You have pain that gets worse or is not helped with medicine. You lose weight for no clear reason. You have night sweats. Get help right away if: You cough up blood. You have trouble breathing. Your heart is beating very fast. These symptoms may be an emergency. Get help right away. Call 911. Do not wait to see if the symptoms will go away. Do not drive yourself to the hospital. This information is not intended to replace advice given to you by your health care provider. Make sure you discuss any questions you have with your health care provider. Document Revised: 12/26/2021 Document Reviewed: 12/26/2021 Elsevier Patient Education  2023 Elsevier Inc.     Signed,   Meredith Staggers, MD [facility], [facility] 09/25/22 11:50 AM

## 2022-09-28 ENCOUNTER — Ambulatory Visit (INDEPENDENT_AMBULATORY_CARE_PROVIDER_SITE_OTHER)
Admission: RE | Admit: 2022-09-28 | Discharge: 2022-09-28 | Disposition: A | Discharge status: Home / Self Care | Payer: Medicare HMO | Source: Ambulatory Visit | Attending: Family Medicine | Admitting: Family Medicine

## 2022-09-28 DIAGNOSIS — R059 Cough, unspecified: Secondary | ICD-10-CM | POA: Diagnosis not present

## 2022-09-28 DIAGNOSIS — M5459 Other low back pain: Secondary | ICD-10-CM | POA: Diagnosis not present

## 2022-09-28 DIAGNOSIS — R052 Subacute cough: Secondary | ICD-10-CM | POA: Diagnosis not present

## 2022-09-28 DIAGNOSIS — M5416 Radiculopathy, lumbar region: Secondary | ICD-10-CM | POA: Diagnosis not present

## 2022-09-30 DIAGNOSIS — M62838 Other muscle spasm: Secondary | ICD-10-CM | POA: Diagnosis not present

## 2022-09-30 DIAGNOSIS — N3944 Nocturnal enuresis: Secondary | ICD-10-CM | POA: Diagnosis not present

## 2022-09-30 DIAGNOSIS — N3946 Mixed incontinence: Secondary | ICD-10-CM | POA: Diagnosis not present

## 2022-10-02 NOTE — Progress Notes (Deleted)
Cardiology Office Note    Date:  10/02/2022   ID:  TINEISHA PERTUIT, DOB [birth year], MRN [id number]  PCP:  Shade Flood, MD  Cardiologist:  None  Electrophysiologist:  None   Chief Complaint: ***  History of Present Illness:   QUARTNEY WINKLEPLECK is a 72 y.o. female with history of ***  ***   Labs independently reviewed: ***  Past Medical History:  Diagnosis Date   Anxiety    Arthritis    Phreesia 04/12/2020   Asthma    Cataract    Phreesia 10/25/2019   Cervical spondylosis    Complication of anesthesia    with colonoscopy woke up twice   Depression    Depression    Phreesia 04/12/2020   GERD (gastroesophageal reflux disease)    Glaucoma    Phreesia 10/25/2019   Hyperlipidemia    Hypertension    IBS (irritable bowel syndrome)    Constipation with diarrhea.   Oral herpes     Past Surgical History:  Procedure Laterality Date   ABDOMINAL HYSTERECTOMY  1978   DUB; ovaries intact.   APPENDECTOMY  1988   BREAST BIOPSY Bilateral 2009   Calcium Deposit- 1980's and also in 2009/ Right side 1995   BREAST EXCISIONAL BIOPSY Bilateral    BREAST SURGERY N/A    Phreesia 10/25/2019   CATARACT EXTRACTION W/PHACO Left 10/23/2020   Procedure: CATARACT EXTRACTION PHACO AND INTRAOCULAR LENS PLACEMENT (IOC) LEFT TORIC LENS KAHOOK DUAL BLADE GONIOTOMY,  VISION BLUE 7.69 00:53.0;  Surgeon: Lockie Mola, MD;  Location: [facility];  Service: Ophthalmology;  Laterality: Left;   CATARACT EXTRACTION W/PHACO Right 11/20/2020   Procedure: CATARACT EXTRACTION PHACO AND INTRAOCULAR LENS PLACEMENT (IOC) RIGHT LENS KAHOOK DUAL BLADE GONIOTOMY VISION BLUE 8.58 01:24.9;  Surgeon: Lockie Mola, MD;  Location: [facility];  Service: Ophthalmology;  Laterality: Right;  latex   COLONOSCOPY  05/11/2009   Normal.  Previous colonoscopy with polyps. [facility].     COLONOSCOPY WITH PROPOFOL N/A 12/20/2019   Procedure: COLONOSCOPY WITH PROPOFOL;  Surgeon: Toney Reil, MD;  Location: [facility];  Service: Gastroenterology;  Laterality: N/A;   DEBRIDEMENT TENNIS ELBOW     ROTATOR CUFF REPAIR     TONSILLECTOMY     UPPER GASTROINTESTINAL ENDOSCOPY      Current Medications: No outpatient medications have been marked as taking for the 10/06/22 encounter (Appointment) with Sondra Barges, PA-C.    Allergies:   Amoxicillin, Augmentin [amoxicillin-pot clavulanate], Codeine, Depakote [divalproex sodium], Latex, Lipitor [atorvastatin], Penicillins, Zocor [simvastatin], and Septra [sulfamethoxazole-trimethoprim]   Social History   Socioeconomic History   Marital status: Divorced    Spouse name: Not on file   Number of children: 2   Years of education: Bachelors   Highest education level: Not on file  Occupational History   Occupation: Training and development officer: [employer]    Comment: also works at [employer].   Occupation: retired  Tobacco Use   Smoking status: Never   Smokeless tobacco: Never  Vaping Use   Vaping Use: Never used  Substance and Sexual Activity   Alcohol use: No   Drug use: Never   Sexual activity: Yes    Partners: Male    Birth control/protection: Surgical, Post-menopausal  Other Topics Concern   Not on file  Social History Narrative   Marital status: divorced since 1999; dating seriously in 2019 since 2015.      Children: one son, one daughter; 2 grandchildren.  Lives: alone in house with 2 dogs     Employment:  Retired.  Needs to work in 2019.      Tobacco: none      Alcohol: rare      Drugs: none      Exercise:  Sporadic   Right-handed.   Occasional caffeine use.      ADLs: independent with ADLs; drives; no assistant devices.      Advanced Directives: none; FULL CODE.   Social Determinants of Health   Financial Resource Strain: Low Risk  (08/13/2022)   Overall Financial Resource Strain (CARDIA)    Difficulty of Paying Living Expenses: Not hard at all  Food Insecurity: No Food Insecurity (08/13/2022)   Hunger  Vital Sign    Worried About Running Out of Food in the Last Year: Never true    Ran Out of Food in the Last Year: Never true  Transportation Needs: No Transportation Needs (08/13/2022)   PRAPARE - Administrator, Civil Service (Medical): No    Lack of Transportation (Non-Medical): No  Physical Activity: Sufficiently Active (08/13/2022)   Exercise Vital Sign    Days of Exercise per Week: 4 days    Minutes of Exercise per Session: 40 min  Stress: Stress Concern Present (08/13/2022)   [employer] of Occupational Health - Occupational Stress Questionnaire    Feeling of Stress : To some extent  Social Connections: Moderately Isolated (08/13/2022)   Social Connection and Isolation Panel [NHANES]    Frequency of Communication with Friends and Family: More than three times a week    Frequency of Social Gatherings with Friends and Family: More than three times a week    Attends Religious Services: More than 4 times per year    Active Member of [employer] or Organizations: No    Attends Engineer, structural: Never    Marital Status: Divorced     Family History:  The patient's family history includes Asthma in her father; Breast cancer in her paternal grandmother; COPD in her father; Cancer in her mother; Colon cancer in her maternal aunt; Diabetes in her mother; Emphysema in her father; Glaucoma in her father and mother; Heart disease in her mother; Hyperlipidemia in her mother; Hypertension in her mother and sister; Stroke (age of onset: 26) in her mother. There is no history of Stomach cancer, Esophageal cancer, or Rectal cancer.  ROS:   ROS   EKGs/Labs/Other Studies Reviewed:    Studies reviewed were summarized above. The additional studies were reviewed today: ***  EKG:  EKG is ordered today.  The EKG ordered today demonstrates ***  Recent Labs: 09/25/2022: BUN 13; Creatinine, Ser 0.80; Hemoglobin 14.1; Platelets 285.0; Potassium 4.3; Sodium 140; TSH 1.86  Recent Lipid  Panel    Component Value Date/Time   CHOL 178 09/12/2021 1003   CHOL 187 06/13/2020 1530   TRIG 304.0 (H) 09/12/2021 1003   HDL 47.30 09/12/2021 1003   HDL 51 06/13/2020 1530   CHOLHDL 4 09/12/2021 1003   VLDL 60.8 (H) 09/12/2021 1003   LDLCALC 98 06/13/2020 1530   LDLDIRECT 99.0 09/12/2021 1003    PHYSICAL EXAM:    VS:  There were no vitals taken for this visit.  BMI: There is no height or weight on file to calculate BMI.  Physical Exam  Wt Readings from Last 3 Encounters:  09/25/22 145 lb (65.8 kg)  06/19/22 145 lb (65.8 kg)  03/20/22 145 lb (65.8 kg)     ASSESSMENT &  PLAN:   ***   {Are you ordering a CV Procedure (e.g. stress test, cath, DCCV, TEE, etc)?   Press F2        :[id number]}     Disposition: F/u with Dr. Marland Kitchen or an APP in ***.   Medication Adjustments/Labs and Tests Ordered: Current medicines are reviewed at length with the patient today.  Concerns regarding medicines are outlined above. Medication changes, Labs and Tests ordered today are summarized above and listed in the Patient Instructions accessible in Encounters.   Signed, Eula Listen, PA-C 10/02/2022 4:22 PM     [facility] [place] [address] [place], [place] [address] [phone]

## 2022-10-06 ENCOUNTER — Ambulatory Visit: Payer: Medicare HMO | Admitting: Physician Assistant

## 2022-10-06 DIAGNOSIS — M5459 Other low back pain: Secondary | ICD-10-CM | POA: Diagnosis not present

## 2022-10-06 DIAGNOSIS — M5416 Radiculopathy, lumbar region: Secondary | ICD-10-CM | POA: Diagnosis not present

## 2022-10-08 ENCOUNTER — Encounter: Payer: Self-pay | Admitting: Gastroenterology

## 2022-10-08 ENCOUNTER — Ambulatory Visit: Payer: Medicare HMO | Admitting: Gastroenterology

## 2022-10-08 VITALS — BP 120/60 | HR 64 | Ht 60.0 in | Wt 147.4 lb

## 2022-10-08 DIAGNOSIS — R0789 Other chest pain: Secondary | ICD-10-CM

## 2022-10-08 DIAGNOSIS — K219 Gastro-esophageal reflux disease without esophagitis: Secondary | ICD-10-CM | POA: Diagnosis not present

## 2022-10-08 DIAGNOSIS — Z79899 Other long term (current) drug therapy: Secondary | ICD-10-CM

## 2022-10-08 DIAGNOSIS — R1310 Dysphagia, unspecified: Secondary | ICD-10-CM

## 2022-10-08 MED ORDER — PANTOPRAZOLE SODIUM 40 MG PO TBEC
DELAYED_RELEASE_TABLET | ORAL | 3 refills | Status: DC
Start: 2022-10-08 — End: 2023-10-14

## 2022-10-08 NOTE — Progress Notes (Signed)
HPI :  72 year old female here for follow-up visit for reflux, dysphagia.  She was last seen in February for an upper endoscopy.  Recall she has had longstanding reflux for some time.  She has had regurgitation/waterbrash in the past.  At the last visit she was having some ongoing symptoms so we increased her Protonix to 40 mg twice daily and performed an upper endoscopy.  Her esophagus was normal, no Barrett's.  No hiatal hernia, Hill grade 1.  Took some biopsies of her esophagus which were normal.  No obvious stricture.  She was having some dysphagia at the time so we performed empiric dilation with 17 mm savory and relook endoscopy showed no mucosal rent.  She states the higher dose Protonix has helped at twice daily dosing.  She is not really having much of any nocturnal symptoms and overall feels improved.  She does have some belching at times and some occasional pyrosis during the day, perhaps once per week from time to time but generally doing much better.  She does think twice daily dosing in general has made her feel better.  It does not sound like the dilation really helped her dysphagia at all.  She continues to feel food gets stuck in her upper throat area with swallows and then take some time to go down.  It occurs only with solids, no liquids, perhaps once every few weeks or so.  No early satiety, no vomiting.  Has some occasional bloating.  She has had episodes of atypical chest pain otherwise in her right chest that can radiate into her arm and neck.  This tends to happen at night when she is lying down, or sometimes when sitting but there is no exertional component.  No chest pain or shortness of breath otherwise.  Denies any triggers, this has been going on since January.  She is working with her primary care to evaluate further issues, not sure if this is really to primary back pain, she has not had a normal chest x-ray.  She is also had some chronic cough and Protonix has not helped  much with that.  Of note in light of her chronic PPI use, she has no history of osteopenia.  No chronic kidney disease.    Prior work-up: Colonoscopy 12/20/19: Dr. Allegra Lai - 2 small rectal polyps, diverticulosis -    DIAGNOSIS:  A. RECTAL POLYPS X2; COLD SNARE:  - FRAGMENTS (X2) OF HYPERPLASTIC POLYPS.  - NEGATIVE FOR DYSPLASIA AND MALIGNANCY.     CT scan abdomen / pelvis 03/07/20: IMPRESSION: Findings consistent with acute sigmoid colon diverticulitis. No perforation or abscess.   EGD 06/19/2022:- Esophagogastric landmarks were identified: the Z-line was found at 33 cm, the gastroesophageal junction was found at 33 cm and the upper extent of the gastric folds was found at 33 cm from the incisors. Findings: - The gastroesophageal flap valve was visualized endoscopically and classified as Hill Grade I (prominent fold, tight to endoscope). - The exam of the esophagus was otherwise normal. No obvious stenosis / stricture. No inflammatory changes. - A guidewire was placed and the scope was withdrawn. Empiric dilation was performed in the entire esophagus with a Savary dilator with mild resistance at 17 mm. Relook endoscopy showed no mucosal wrents. Biopsies were obtained from the proximal and distal esophagus with cold forceps to rule out eosinophilic esophagitis. - Multiple small sessile polyps were found in the gastric fundus and in the gastric body. Likely benign fundic gland polyps in the setting  of PPI use. One representative polyp was removed with a cold biopsy forceps. Resection and retrieval were complete. - The exam of the stomach was otherwise normal. - The examined duodenum was normal.  1. Surgical [P], gastric polyps FUNDIC GLAND POLYP. 2. Surgical [P], esophageal bx UNREMARKABLE SQUAMOUS MUCOSA. NEGATIVE FOR EOSINOPHILIC ESOPHAGITIS.   Past Medical History:  Diagnosis Date   Anxiety    Arthritis    Phreesia 04/12/2020   Asthma    Cataract    Phreesia 10/25/2019   Cervical  spondylosis    Complication of anesthesia    with colonoscopy woke up twice   Depression    Depression    Phreesia 04/12/2020   GERD (gastroesophageal reflux disease)    Glaucoma    Phreesia 10/25/2019   Hyperlipidemia    Hypertension    IBS (irritable bowel syndrome)    Constipation with diarrhea.   Oral herpes      Past Surgical History:  Procedure Laterality Date   ABDOMINAL HYSTERECTOMY  1978   DUB; ovaries intact.   APPENDECTOMY  1988   BREAST BIOPSY Bilateral 2009   Calcium Deposit- 1980's and also in 2009/ Right side 1995   BREAST EXCISIONAL BIOPSY Bilateral    BREAST SURGERY N/A    Phreesia 10/25/2019   CATARACT EXTRACTION W/PHACO Left 10/23/2020   Procedure: CATARACT EXTRACTION PHACO AND INTRAOCULAR LENS PLACEMENT (IOC) LEFT TORIC LENS KAHOOK DUAL BLADE GONIOTOMY,  VISION BLUE 7.69 00:53.0;  Surgeon: Lockie Mola, MD;  Location: [facility];  Service: Ophthalmology;  Laterality: Left;   CATARACT EXTRACTION W/PHACO Right 11/20/2020   Procedure: CATARACT EXTRACTION PHACO AND INTRAOCULAR LENS PLACEMENT (IOC) RIGHT LENS KAHOOK DUAL BLADE GONIOTOMY VISION BLUE 8.58 01:24.9;  Surgeon: Lockie Mola, MD;  Location: [facility];  Service: Ophthalmology;  Laterality: Right;  latex   COLONOSCOPY  05/11/2009   Normal.  Previous colonoscopy with polyps. [facility].     COLONOSCOPY WITH PROPOFOL N/A 12/20/2019   Procedure: COLONOSCOPY WITH PROPOFOL;  Surgeon: Toney Reil, MD;  Location: [facility];  Service: Gastroenterology;  Laterality: N/A;   DEBRIDEMENT TENNIS ELBOW     ROTATOR CUFF REPAIR     TONSILLECTOMY     UPPER GASTROINTESTINAL ENDOSCOPY     Family History  Problem Relation Age of Onset   Hyperlipidemia Mother    Hypertension Mother    Cancer Mother        lung   Diabetes Mother    Glaucoma Mother    Stroke Mother 27   Heart disease Mother    Asthma Father    Emphysema Father    COPD Father    Glaucoma Father     Hypertension Sister    Colon cancer Maternal Aunt    Breast cancer Paternal Grandmother    Stomach cancer Neg Hx    Esophageal cancer Neg Hx    Rectal cancer Neg Hx    Social History   Tobacco Use   Smoking status: Never   Smokeless tobacco: Never  Vaping Use   Vaping Use: Never used  Substance Use Topics   Alcohol use: No   Drug use: Never   Current Outpatient Medications  Medication Sig Dispense Refill   albuterol (PROVENTIL HFA;VENTOLIN HFA) 108 (90 Base) MCG/ACT inhaler Inhale 2 puffs into the lungs every 4 (four) hours as needed for wheezing or shortness of breath (cough, shortness of breath or wheezing.). 1 Inhaler 1   dorzolamide-timolol (COSOPT) 22.3-6.8 MG/ML ophthalmic solution      famotidine (PEPCID)  40 MG tablet TAKE 1 TABLET BY MOUTH DAILY 90 tablet 0   FLUoxetine (PROZAC) 20 MG capsule TAKE THREE CAPSULES BY MOUTH DAILY 270 capsule 1   fluticasone (FLONASE) 50 MCG/ACT nasal spray Place 2 sprays into both nostrils daily. 16 g 6   latanoprost (XALATAN) 0.005 % ophthalmic solution 1 drop at bedtime.     Latanoprostene Bunod 0.024 % SOLN Place 1 drop into the left eye daily.     methylcellulose (CITRUCEL) oral powder Take 1 packet by mouth daily.     Omega-3 Fatty Acids (FISH OIL OMEGA-3) 1000 MG CAPS Take by mouth.     oxybutynin (DITROPAN-XL) 10 MG 24 hr tablet Take 10 mg by mouth at bedtime.     pantoprazole (PROTONIX) 40 MG tablet TAKE ONE TABLET BY MOUTH TWICE (2) DAILY BEFORE A MEAL 60 tablet 1   rosuvastatin (CRESTOR) 10 MG tablet TAKE ONE TABLET BY MOUTH DAILY 90 tablet 3   spironolactone (ALDACTONE) 50 MG tablet TAKE ONE TABLET BY MOUTH DAILY 90 tablet 1   VITAMIN D PO Take by mouth.     No current facility-administered medications for this visit.   Allergies  Allergen Reactions   Amoxicillin Anaphylaxis   Augmentin [Amoxicillin-Pot Clavulanate]     Numbness around mouth, throat tightness   Codeine Itching   Depakote [Divalproex Sodium]     hives    Latex Hives   Lipitor [Atorvastatin]     Muscles pain   Penicillins     Throat tightness, mouth numb   Zocor [Simvastatin]     Muscle pain   Septra [Sulfamethoxazole-Trimethoprim] Rash     Review of Systems: All systems reviewed and negative except where noted in HPI.    DG Chest 2 View  Result Date: 09/28/2022 CLINICAL DATA:  Dry cough EXAM: CHEST - 2 VIEW COMPARISON:  03/10/2019 FINDINGS: The heart size and mediastinal contours are within normal limits. Both lungs are clear. The visualized skeletal structures are unremarkable. IMPRESSION: No active cardiopulmonary disease. Electronically Signed   By: Jasmine Pang M.D.   On: 09/28/2022 16:19    Lab Results  Component Value Date   WBC 8.3 09/25/2022   HGB 14.1 09/25/2022   HCT 40.5 09/25/2022   MCV 94.4 09/25/2022   PLT 285.0 09/25/2022    Lab Results  Component Value Date   CREATININE 0.80 09/25/2022   BUN 13 09/25/2022   NA 140 09/25/2022   K 4.3 09/25/2022   CL 104 09/25/2022   CO2 28 09/25/2022    Lab Results  Component Value Date   ALT 35 09/12/2021   AST 29 09/12/2021   ALKPHOS 91 09/12/2021   BILITOT 0.7 09/12/2021      Physical Exam: BP 120/60 (BP Location: Left Arm, Patient Position: Sitting, Cuff Size: Normal)   Pulse 64   Ht 5' (1.524 m)   Wt 147 lb 6 oz (66.8 kg)   BMI 28.78 kg/m  Constitutional: Pleasant,well-developed, female in no acute distress. HEENT: Normocephalic and atraumatic. Conjunctivae are normal. No scleral icterus. Neck supple.  Cardiovascular: Normal rate, regular rhythm.  Pulmonary/chest: Effort normal and breath sounds normal.  Abdominal: Soft, nondistended, nontender. There are no masses palpable. Extremities: no edema Lymphadenopathy: No cervical adenopathy noted. Neurological: Alert and oriented to person place and time. Skin: Skin is warm and dry. No rashes noted. Psychiatric: Normal mood and affect. Behavior is normal.   ASSESSMENT: 72 y.o. female here for  assessment of the following  1. Gastroesophageal reflux disease, unspecified whether  esophagitis present   2. Long-term current use of proton pump inhibitor therapy   3. Dysphagia, unspecified type   4. Atypical chest pain    Longstanding reflux.  Recent EGD shows no Barrett's esophagus, no concerning or high risk pathology.  Negative for EOE.  Ongoing dysphagia, empiric dilation did not provide benefit.  She clearly has benefited from increased dosing of Protonix and this for the most part controls her symptoms with relatively rare breakthrough.  We discussed long-term management of reflux and chronic PPI use.  I outlined risks of chronic PPIs and she is understanding of this, use lowest dose needed to control symptoms.  Take half hour prior to meal, she will use lowest dose needed to control symptoms, currently that is twice daily dosing.  I do think she is a good candidate for TIF if she wanted to come off PPI over time and manage without medication.  I explained with her what this is and she wants to think about it.  In regards to her dysphagia, unclear cause, empiric dilation has not helped.  Possible she has some dysmotility.  We discussed options to evaluate this.  Will start with a barium swallow with tablet to see if we can help localize dysphagia and get overall sense of motility.  Counseled her that pending findings, if this really bothers her, could also consider manometry testing and/or 24-hour pH impedance testing in regards to some of her other chest symptoms.  If she wants to take a dose of Maalox when she gets these nocturnal chest pain she can try and see if that helps at all.  If this resolves it is likely due to reflux.  She can also try taking some peppermint altoids empirically to treat for esophageal spasm, 2 SL PRN at onset of symptoms.  She will follow-up with her primary care who is also evaluating her chest symptoms, not sure if this is otherwise musculoskeletal related to her  back, defer if they want to pursue cardiac evaluation although this is not exertional and tends to happen at night when she is recumbent but also rarely during the day.  PLAN: - barium swallow with tablet - continue protonix 40mg  BID - use 1/2 hr prior to meal, use lowest dose needed to control symptoms. Counseled on risks / benefits of chronic PPI use, use lowest daily dose needed to control symptoms - add Maaalox PRN for breakthrough episodes of chest pain to see if it helps  - trial of peppermint althoids SL for episodes - consideration for pH / manometry if symtoms persist - consideration for TIF pending her course, if she wants to come off PPI or if she does not feel adequately controlled on higher dose PPI  - she wants to think about it - f/u PCP to rule out other causes of pain - back / musculoskeletal, cardiac  Harlin Rain, MD [facility]

## 2022-10-08 NOTE — Patient Instructions (Signed)
If your blood pressure at your visit was 140/90 or greater, please contact your primary care physician to follow up on this. ______________________________________________________  If you are age 73 or older, your body mass index should be between 23-30. Your Body mass index is 28.78 kg/m. If this is out of the aforementioned range listed, please consider follow up with your Primary Care Provider.  If you are age 104 or younger, your body mass index should be between 19-25. Your Body mass index is 28.78 kg/m. If this is out of the aformentioned range listed, please consider follow up with your Primary Care Provider.  ________________________________________________________  The [facility] providers would like to encourage you to use [facility] to communicate with providers for non-urgent requests or questions.  Due to long hold times on the telephone, sending your provider a message by [facility] may be a faster and more efficient way to get a response.  Please allow 48 business hours for a response.  Please remember that this is for non-urgent requests.  _______________________________________________________  Due to recent changes in healthcare laws, you may see the results of your imaging and laboratory studies on MyChart before your provider has had a chance to review them.  We understand that in some cases there may be results that are confusing or concerning to you. Not all laboratory results come back in the same time frame and the provider may be waiting for multiple results in order to interpret others.  Please give [place] 48 hours in order for your provider to thoroughly review all the results before contacting the office for clarification of your results.   You have been scheduled for a Barium Esophogram at [facility] (1st floor of the hospital) on Monday, 10-19-22 at 10:00am. Please arrive 30 minutes prior to your appointment for registration. Make certain not to have anything to eat or  drink 3 hours prior to your test. If you need to reschedule for any reason, please contact radiology at [phone] to do so. __________________________________________________________________ A barium swallow is an examination that concentrates on views of the esophagus. This tends to be a double contrast exam (barium and two liquids which, when combined, create a gas to distend the wall of the oesophagus) or single contrast (non-ionic iodine based). The study is usually tailored to your symptoms so a good history is essential. Attention is paid during the study to the form, structure and configuration of the esophagus, looking for functional disorders (such as aspiration, dysphagia, achalasia, motility and reflux) EXAMINATION You may be asked to change into a gown, depending on the type of swallow being performed. A radiologist and radiographer will perform the procedure. The radiologist will advise you of the type of contrast selected for your procedure and direct you during the exam. You will be asked to stand, sit or lie in several different positions and to hold a small amount of fluid in your mouth before being asked to swallow while the imaging is performed .In some instances you may be asked to swallow barium coated marshmallows to assess the motility of a solid food bolus. The exam can be recorded as a digital or video fluoroscopy procedure. POST PROCEDURE It will take 1-2 days for the barium to pass through your system. To facilitate this, it is important, unless otherwise directed, to increase your fluids for the next 24-48hrs and to resume your normal diet.  This test typically takes about 30 minutes to perform. __________________________________________________________  We have sent the following medications to  your pharmacy for you to pick up at your convenience: Protonix: Take twice a day (take the lowest dose that manages your symptoms)  Please purchase the following medications over  the counter and take as directed: You can use Maalox as needed  Try 2 Peppermint Altoids under your tongue to see if that helps during an episode  We are giving you a TIF handout.  Thank you for entrusting me with your care and for choosing [facility], Dr. Ileene Patrick

## 2022-10-09 ENCOUNTER — Ambulatory Visit (INDEPENDENT_AMBULATORY_CARE_PROVIDER_SITE_OTHER): Payer: Medicare HMO | Admitting: Family Medicine

## 2022-10-09 VITALS — BP 122/60 | HR 62 | Temp 97.9°F | Ht 60.0 in | Wt 146.4 lb

## 2022-10-09 DIAGNOSIS — R002 Palpitations: Secondary | ICD-10-CM

## 2022-10-09 DIAGNOSIS — F5104 Psychophysiologic insomnia: Secondary | ICD-10-CM

## 2022-10-09 DIAGNOSIS — R42 Dizziness and giddiness: Secondary | ICD-10-CM

## 2022-10-09 DIAGNOSIS — R052 Subacute cough: Secondary | ICD-10-CM | POA: Diagnosis not present

## 2022-10-09 MED ORDER — ARNUITY ELLIPTA 100 MCG/ACT IN AEPB
100.0000 ug | INHALATION_SPRAY | Freq: Every day | RESPIRATORY_TRACT | 2 refills | Status: DC | PRN
Start: 2022-10-09 — End: 2023-11-08

## 2022-10-09 MED ORDER — HYDROXYZINE HCL 10 MG PO TABS
10.0000 mg | ORAL_TABLET | Freq: Every evening | ORAL | 0 refills | Status: AC | PRN
Start: 2022-10-09 — End: ?

## 2022-10-09 NOTE — Progress Notes (Signed)
Subjective:  Patient ID: Maria Sweeney, female    DOB: 1951-05-01  Age: 72 y.o. MRN: [id number]  CC:  Chief Complaint  Patient presents with  . Insomnia    Continued issue with this,   . Dizziness    Notes some dizziness yesterday but with nausea did not have episodes frequently     HPI KISHANA STINCHFIELD presents for   Insomnia Chronic issue, has taken mirtazapine previously.problems for several years, but seems to be getting worse past year. Not sleepy at bedtime, can be 2-3 am prior to going to bed. Sleep 3-4 hours at times, sometimes sleeps late to 9:30 or 10. Max 5-6 hrs per night. Trouble getting mind to shut down.  Mirtazapine 7.5 felt too drugged in past when taken - tried twice. No other meds tried. Feels depressed at times - several years. No SI/HI.  On prozac 40mg  qd. No change in depression at 40mg  vs 60mg  dose prior.  Tried melatonin years ago - not sure helpful then. Unknown dose.  Ambien a long time ago - initial improvement then did not help. Sleep study in early 2000. Trouble going into rem sleep? No OSA.  No caffeine.  Exercise - in morning or afternoon.  No other activities in bed.  Bathing at night.  No electronic media in bed/at night. When unable to sleep - look at phone or read.    Dizziness With heart palpitations, discussed at her recent visit few weeks ago.  Handout given.  No acute findings seen on EKG, but was referred back to cardiology.CBC, TSH, BMP overall reassuring, glucose 118. Appt with Dr. Mariah Milling 6/17.  Still notes some palpitations when lying down at night, for few minutes. Last felt chest pain with coughing spell last weekend, no recurrence.  Still noting palpitation with lying down - skipped beat for a minute or less. No fast heartbeat. No syncope/near-syncope.  Cough: See prior note, CXR on 5/20 without concerns. Episode last weekend - felt short winded, dry cough with coughing fits, but fine past few days. Asthma as a child. Used albuterol  which helped 3-4 times. Using flonase - no nasal congestion. Some heartburn - saw GI yesterday. On pepcid and protonix. Barium swallow planned.   History Patient Active Problem List   Diagnosis Date Noted  . Encounter for screening colonoscopy   . Vitamin D deficiency 08/02/2018  . Prediabetes 08/02/2018  . Carpal tunnel syndrome 02/10/2018  . Tinnitus 06/25/2016  . Numbness 04/13/2016  . Loss of smell 04/13/2016  . Pure hypercholesterolemia 11/30/2014  . Degenerative disc disease, cervical 04/24/2014  . Essential hypertension, benign 10/19/2013  . Insomnia 10/19/2013  . Herpes labialis 10/19/2013  . Anxiety and depression 07/25/2012  . GERD (gastroesophageal reflux disease) 07/25/2012   Past Medical History:  Diagnosis Date  . Anxiety   . Arthritis    Phreesia 04/12/2020  . Asthma   . Cataract    Phreesia 10/25/2019  . Cervical spondylosis   . Complication of anesthesia    with colonoscopy woke up twice  . Depression   . Depression    Phreesia 04/12/2020  . GERD (gastroesophageal reflux disease)   . Glaucoma    Phreesia 10/25/2019  . Hyperlipidemia   . Hypertension   . IBS (irritable bowel syndrome)    Constipation with diarrhea.  . Oral herpes    Past Surgical History:  Procedure Laterality Date  . ABDOMINAL HYSTERECTOMY  1978   DUB; ovaries intact.  . APPENDECTOMY  1988  .  BREAST BIOPSY Bilateral 2009   Calcium Deposit- 1980's and also in 2009/ Right side 1995  . BREAST EXCISIONAL BIOPSY Bilateral   . BREAST SURGERY N/A    Phreesia 10/25/2019  . CATARACT EXTRACTION W/PHACO Left 10/23/2020   Procedure: CATARACT EXTRACTION PHACO AND INTRAOCULAR LENS PLACEMENT (IOC) LEFT TORIC LENS KAHOOK DUAL BLADE GONIOTOMY,  VISION BLUE 7.69 00:53.0;  Surgeon: Lockie Mola, MD;  Location: [facility];  Service: Ophthalmology;  Laterality: Left;  . CATARACT EXTRACTION W/PHACO Right 11/20/2020   Procedure: CATARACT EXTRACTION PHACO AND INTRAOCULAR LENS  PLACEMENT (IOC) RIGHT LENS KAHOOK DUAL BLADE GONIOTOMY VISION BLUE 8.58 01:24.9;  Surgeon: Lockie Mola, MD;  Location: [facility];  Service: Ophthalmology;  Laterality: Right;  latex  . COLONOSCOPY  05/11/2009   Normal.  Previous colonoscopy with polyps. [place].    . COLONOSCOPY WITH PROPOFOL N/A 12/20/2019   Procedure: COLONOSCOPY WITH PROPOFOL;  Surgeon: Toney Reil, MD;  Location: [facility];  Service: Gastroenterology;  Laterality: N/A;  . DEBRIDEMENT TENNIS ELBOW    . ROTATOR CUFF REPAIR    . TONSILLECTOMY    . UPPER GASTROINTESTINAL ENDOSCOPY     Allergies  Allergen Reactions  . Amoxicillin Anaphylaxis  . Augmentin [Amoxicillin-Pot Clavulanate]     Numbness around mouth, throat tightness  . Codeine Itching  . Depakote [Divalproex Sodium]     hives  . Latex Hives  . Lipitor [Atorvastatin]     Muscles pain  . Penicillins     Throat tightness, mouth numb  . Zocor [Simvastatin]     Muscle pain  . Septra [Sulfamethoxazole-Trimethoprim] Rash   Prior to Admission medications   Medication Sig Start Date End Date Taking? Authorizing Provider  albuterol (PROVENTIL HFA;VENTOLIN HFA) 108 (90 Base) MCG/ACT inhaler Inhale 2 puffs into the lungs every 4 (four) hours as needed for wheezing or shortness of breath (cough, shortness of breath or wheezing.). 02/11/16  Yes Ethelda Chick, MD  dorzolamide-timolol (COSOPT) 22.3-6.8 MG/ML ophthalmic solution  12/25/19  Yes [provider]  famotidine (PEPCID) 40 MG tablet TAKE 1 TABLET BY MOUTH DAILY 05/25/22  Yes Shade Flood, MD  FLUoxetine (PROZAC) 20 MG capsule TAKE THREE CAPSULES BY MOUTH DAILY 08/03/22  Yes Shade Flood, MD  fluticasone (FLONASE) 50 MCG/ACT nasal spray Place 2 sprays into both nostrils daily. 09/25/22  Yes Shade Flood, MD  latanoprost (XALATAN) 0.005 % ophthalmic solution 1 drop at bedtime.   Yes [provider]  Latanoprostene Bunod 0.024 % SOLN Place 1 drop into  the left eye daily.   Yes [provider]  methylcellulose (CITRUCEL) oral powder Take 1 packet by mouth daily. 03/20/22  Yes Armbruster, Willaim Rayas, MD  Omega-3 Fatty Acids (FISH OIL OMEGA-3) 1000 MG CAPS Take by mouth.   Yes [provider]  oxybutynin (DITROPAN-XL) 10 MG 24 hr tablet Take 10 mg by mouth at bedtime. 05/23/22  Yes [provider]  pantoprazole (PROTONIX) 40 MG tablet TAKE ONE TABLET BY MOUTH once to TWICE (2) DAILY BEFORE A MEAL 10/08/22  Yes Armbruster, Willaim Rayas, MD  rosuvastatin (CRESTOR) 10 MG tablet TAKE ONE TABLET BY MOUTH DAILY 08/10/22  Yes Shade Flood, MD  spironolactone (ALDACTONE) 50 MG tablet TAKE ONE TABLET BY MOUTH DAILY 08/10/22  Yes Shade Flood, MD  VITAMIN D PO Take by mouth.   Yes [provider]   Social History   Socioeconomic History  . Marital status: Divorced    Spouse name: Not on file  .  Number of children: 2  . Years of education: Bachelors  . Highest education level: Bachelor's degree (e.g., BA, AB, BS)  Occupational History  . Occupation: Training and development officer: [employer]    Comment: also works at [employer].  . Occupation: retired  Tobacco Use  . Smoking status: Never  . Smokeless tobacco: Never  Vaping Use  . Vaping Use: Never used  Substance and Sexual Activity  . Alcohol use: No  . Drug use: Never  . Sexual activity: Yes    Partners: Male    Birth control/protection: Surgical, Post-menopausal  Other Topics Concern  . Not on file  Social History Narrative   Marital status: divorced since 1999; dating seriously in 2019 since 2015.      Children: one son, one daughter; 2 grandchildren.      Lives: alone in house with 2 dogs     Employment:  Retired.  Needs to work in 2019.      Tobacco: none      Alcohol: rare      Drugs: none      Exercise:  Sporadic   Right-handed.   Occasional caffeine use.      ADLs: independent with ADLs; drives; no assistant devices.      Advanced Directives:  none; FULL CODE.   Social Determinants of Health   Financial Resource Strain: Low Risk  (10/08/2022)   Overall Financial Resource Strain (CARDIA)   . Difficulty of Paying Living Expenses: Not very hard  Food Insecurity: No Food Insecurity (10/08/2022)   Hunger Vital Sign   . Worried About Programme researcher, broadcasting/film/video in the Last Year: Never true   . Ran Out of Food in the Last Year: Never true  Transportation Needs: No Transportation Needs (10/08/2022)   PRAPARE - Transportation   . Lack of Transportation (Medical): No   . Lack of Transportation (Non-Medical): No  Physical Activity: Insufficiently Active (10/08/2022)   Exercise Vital Sign   . Days of Exercise per Week: 2 days   . Minutes of Exercise per Session: 30 min  Stress: No Stress Concern Present (10/08/2022)   [employer] of Occupational Health - Occupational Stress Questionnaire   . Feeling of Stress : Only a little  Recent Concern: Stress - Stress Concern Present (08/13/2022)   [employer] of Occupational Health - Occupational Stress Questionnaire   . Feeling of Stress : To some extent  Social Connections: Moderately Integrated (10/08/2022)   Social Connection and Isolation Panel [NHANES]   . Frequency of Communication with Friends and Family: More than three times a week   . Frequency of Social Gatherings with Friends and Family: More than three times a week   . Attends Religious Services: More than 4 times per year   . Active Member of Clubs or Organizations: No   . Attends Banker Meetings: Never   . Marital Status: Living with partner  Recent Concern: Social Connections - Moderately Isolated (08/13/2022)   Social Connection and Isolation Panel [NHANES]   . Frequency of Communication with Friends and Family: More than three times a week   . Frequency of Social Gatherings with Friends and Family: More than three times a week   . Attends Religious Services: More than 4 times per year   . Active Member of Clubs  or Organizations: No   . Attends Banker Meetings: Never   . Marital Status: Divorced  Catering manager Violence: Not At Risk (08/13/2022)   Humiliation,  Afraid, Rape, and Kick questionnaire   . Fear of Current or Ex-Partner: No   . Emotionally Abused: No   . Physically Abused: No   . Sexually Abused: No    Review of Systems   Objective:   Vitals:   10/09/22 1042  BP: 122/60  Pulse: 62  Temp: 97.9 F (36.6 C)  TempSrc: Temporal  SpO2: 97%  Weight: 146 lb 6.4 oz (66.4 kg)  Height: 5' (1.524 m)     Physical Exam Vitals reviewed.  Constitutional:      General: She is not in acute distress.    Appearance: She is well-developed.  HENT:     Head: Normocephalic and atraumatic.     Right Ear: Hearing, tympanic membrane, ear canal and external ear normal.     Left Ear: Hearing, tympanic membrane, ear canal and external ear normal.     Nose: Nose normal.     Mouth/Throat:     Pharynx: No posterior oropharyngeal erythema.  Eyes:     Conjunctiva/sclera: Conjunctivae normal.     Pupils: Pupils are equal, round, and reactive to light.  Cardiovascular:     Rate and Rhythm: Normal rate and regular rhythm.     Heart sounds: Normal heart sounds. No murmur heard. Pulmonary:     Effort: Pulmonary effort is normal. No respiratory distress.     Breath sounds: Normal breath sounds. No wheezing or rhonchi.  Skin:    General: Skin is warm and dry.     Findings: No rash.  Neurological:     Mental Status: She is alert and oriented to person, place, and time.  Psychiatric:        Mood and Affect: Mood normal.        Behavior: Behavior normal.      Assessment & Plan:  AMARYS VENTERS is a 72 y.o. female . Dizziness  Palpitations  Chronic insomnia - Plan: hydrOXYzine (ATARAX) 10 MG tablet  Subacute cough - Plan: Fluticasone Furoate (ARNUITY ELLIPTA) 100 MCG/ACT AEPB   Meds ordered this encounter  Medications  . hydrOXYzine (ATARAX) 10 MG tablet    Sig: Take 1  tablet (10 mg total) by mouth at bedtime as needed.    Dispense:  30 tablet    Refill:  0  . Fluticasone Furoate (ARNUITY ELLIPTA) 100 MCG/ACT AEPB    Sig: Inhale 100 mcg into the lungs daily as needed.    Dispense:  30 each    Refill:  2   Patient Instructions  Keep follow up with cardiology as planned. If any return of chest pain or worsening symptoms, then be seen in ER.   Try hydroxyzine temporarily for sleep onset, try making a list of concerns and schedule time in the middle of the day or morning to review those concerns. Read book if unable to sleep - avoid electronic media at night.   Insomnia Insomnia is a sleep disorder that makes it difficult to fall asleep or stay asleep. Insomnia can cause fatigue, low energy, difficulty concentrating, mood swings, and poor performance at work or school. There are three different ways to classify insomnia: Difficulty falling asleep. Difficulty staying asleep. Waking up too early in the morning. Any type of insomnia can be long-term (chronic) or short-term (acute). Both are common. Short-term insomnia usually lasts for 3 months or less. Chronic insomnia occurs at least three times a week for longer than 3 months. What are the causes? Insomnia may be caused by another condition, situation, or substance,  such as: Having certain mental health conditions, such as anxiety and depression. Using caffeine, alcohol, tobacco, or drugs. Having gastrointestinal conditions, such as gastroesophageal reflux disease (GERD). Having certain medical conditions. These include: Asthma. Alzheimer's disease. Stroke. Chronic pain. An overactive thyroid gland (hyperthyroidism). Other sleep disorders, such as restless legs syndrome and sleep apnea. Menopause. Sometimes, the cause of insomnia may not be known. What increases the risk? Risk factors for insomnia include: Gender. Females are affected more often than males. Age. Insomnia is more common as people  get older. Stress and certain medical and mental health conditions. Lack of exercise. Having an irregular work schedule. This may include working night shifts and traveling between different time zones. What are the signs or symptoms? If you have insomnia, the main symptom is having trouble falling asleep or having trouble staying asleep. This may lead to other symptoms, such as: Feeling tired or having low energy. Feeling nervous about going to sleep. Not feeling rested in the morning. Having trouble concentrating. Feeling irritable, anxious, or depressed. How is this diagnosed? This condition may be diagnosed based on: Your symptoms and medical history. Your health care provider may ask about: Your sleep habits. Any medical conditions you have. Your mental health. A physical exam. How is this treated? Treatment for insomnia depends on the cause. Treatment may focus on treating an underlying condition that is causing the insomnia. Treatment may also include: Medicines to help you sleep. Counseling or therapy. Lifestyle adjustments to help you sleep better. Follow these instructions at home: Eating and drinking  Limit or avoid alcohol, caffeinated beverages, and products that contain nicotine and tobacco, especially close to bedtime. These can disrupt your sleep. Do not eat a large meal or eat spicy foods right before bedtime. This can lead to digestive discomfort that can make it hard for you to sleep. Sleep habits  Keep a sleep diary to help you and your health care provider figure out what could be causing your insomnia. Write down: When you sleep. When you wake up during the night. How well you sleep and how rested you feel the next day. Any side effects of medicines you are taking. What you eat and drink. Make your bedroom a dark, comfortable place where it is easy to fall asleep. Put up shades or blackout curtains to block light from outside. Use a white noise machine to  block noise. Keep the temperature cool. Limit screen use before bedtime. This includes: Not watching TV. Not using your smartphone, tablet, or computer. Stick to a routine that includes going to bed and waking up at the same times every day and night. This can help you fall asleep faster. Consider making a quiet activity, such as reading, part of your nighttime routine. Try to avoid taking naps during the day so that you sleep better at night. Get out of bed if you are still awake after 15 minutes of trying to sleep. Keep the lights down, but try reading or doing a quiet activity. When you feel sleepy, go back to bed. General instructions Take over-the-counter and prescription medicines only as told by your health care provider. Exercise regularly as told by your health care provider. However, avoid exercising in the hours right before bedtime. Use relaxation techniques to manage stress. Ask your health care provider to suggest some techniques that may work well for you. These may include: Breathing exercises. Routines to release muscle tension. Visualizing peaceful scenes. Make sure that you drive carefully. Do not drive if you  feel very sleepy. Keep all follow-up visits. This is important. Contact a health care provider if: You are tired throughout the day. You have trouble in your daily routine due to sleepiness. You continue to have sleep problems, or your sleep problems get worse. Get help right away if: You have thoughts about hurting yourself or someone else. Get help right away if you feel like you may hurt yourself or others, or have thoughts about taking your own life. Go to your nearest emergency room or: Call 911. Call the National Suicide Prevention Lifeline at 385-562-7625 or 988. This is open 24 hours a day. Text the Crisis Text Line at (312)232-3823. Summary Insomnia is a sleep disorder that makes it difficult to fall asleep or stay asleep. Insomnia can be long-term (chronic)  or short-term (acute). Treatment for insomnia depends on the cause. Treatment may focus on treating an underlying condition that is causing the insomnia. Keep a sleep diary to help you and your health care provider figure out what could be causing your insomnia. This information is not intended to replace advice given to you by your health care provider. Make sure you discuss any questions you have with your health care provider. Document Revised: 04/07/2021 Document Reviewed: 04/07/2021 Elsevier Patient Education  2024 Elsevier Inc.     Signed,   Meredith Staggers, MD [facility], [facility] 10/09/22 11:27 AM

## 2022-10-09 NOTE — Patient Instructions (Addendum)
Keep follow up with cardiology as planned. If any return of chest pain or worsening symptoms, then be seen in ER.   Try hydroxyzine temporarily for sleep onset, try making a list of concerns and schedule time in the middle of the day or morning to review those concerns. Read book if unable to sleep - avoid electronic media at night.   Try new inhaler once per day for now to see if that helps cough, albuterol if needed.  Return to the clinic or go to the nearest emergency room if any of your symptoms worsen or new symptoms occur.    Insomnia Insomnia is a sleep disorder that makes it difficult to fall asleep or stay asleep. Insomnia can cause fatigue, low energy, difficulty concentrating, mood swings, and poor performance at work or school. There are three different ways to classify insomnia: Difficulty falling asleep. Difficulty staying asleep. Waking up too early in the morning. Any type of insomnia can be long-term (chronic) or short-term (acute). Both are common. Short-term insomnia usually lasts for 3 months or less. Chronic insomnia occurs at least three times a week for longer than 3 months. What are the causes? Insomnia may be caused by another condition, situation, or substance, such as: Having certain mental health conditions, such as anxiety and depression. Using caffeine, alcohol, tobacco, or drugs. Having gastrointestinal conditions, such as gastroesophageal reflux disease (GERD). Having certain medical conditions. These include: Asthma. Alzheimer's disease. Stroke. Chronic pain. An overactive thyroid gland (hyperthyroidism). Other sleep disorders, such as restless legs syndrome and sleep apnea. Menopause. Sometimes, the cause of insomnia may not be known. What increases the risk? Risk factors for insomnia include: Gender. Females are affected more often than males. Age. Insomnia is more common as people get older. Stress and certain medical and mental health  conditions. Lack of exercise. Having an irregular work schedule. This may include working night shifts and traveling between different time zones. What are the signs or symptoms? If you have insomnia, the main symptom is having trouble falling asleep or having trouble staying asleep. This may lead to other symptoms, such as: Feeling tired or having low energy. Feeling nervous about going to sleep. Not feeling rested in the morning. Having trouble concentrating. Feeling irritable, anxious, or depressed. How is this diagnosed? This condition may be diagnosed based on: Your symptoms and medical history. Your health care provider may ask about: Your sleep habits. Any medical conditions you have. Your mental health. A physical exam. How is this treated? Treatment for insomnia depends on the cause. Treatment may focus on treating an underlying condition that is causing the insomnia. Treatment may also include: Medicines to help you sleep. Counseling or therapy. Lifestyle adjustments to help you sleep better. Follow these instructions at home: Eating and drinking  Limit or avoid alcohol, caffeinated beverages, and products that contain nicotine and tobacco, especially close to bedtime. These can disrupt your sleep. Do not eat a large meal or eat spicy foods right before bedtime. This can lead to digestive discomfort that can make it hard for you to sleep. Sleep habits  Keep a sleep diary to help you and your health care provider figure out what could be causing your insomnia. Write down: When you sleep. When you wake up during the night. How well you sleep and how rested you feel the next day. Any side effects of medicines you are taking. What you eat and drink. Make your bedroom a dark, comfortable place where it is easy to fall  asleep. Put up shades or blackout curtains to block light from outside. Use a white noise machine to block noise. Keep the temperature cool. Limit screen use  before bedtime. This includes: Not watching TV. Not using your smartphone, tablet, or computer. Stick to a routine that includes going to bed and waking up at the same times every day and night. This can help you fall asleep faster. Consider making a quiet activity, such as reading, part of your nighttime routine. Try to avoid taking naps during the day so that you sleep better at night. Get out of bed if you are still awake after 15 minutes of trying to sleep. Keep the lights down, but try reading or doing a quiet activity. When you feel sleepy, go back to bed. General instructions Take over-the-counter and prescription medicines only as told by your health care provider. Exercise regularly as told by your health care provider. However, avoid exercising in the hours right before bedtime. Use relaxation techniques to manage stress. Ask your health care provider to suggest some techniques that may work well for you. These may include: Breathing exercises. Routines to release muscle tension. Visualizing peaceful scenes. Make sure that you drive carefully. Do not drive if you feel very sleepy. Keep all follow-up visits. This is important. Contact a health care provider if: You are tired throughout the day. You have trouble in your daily routine due to sleepiness. You continue to have sleep problems, or your sleep problems get worse. Get help right away if: You have thoughts about hurting yourself or someone else. Get help right away if you feel like you may hurt yourself or others, or have thoughts about taking your own life. Go to your nearest emergency room or: Call 911. Call the National Suicide Prevention Lifeline at 609-286-1329 or 988. This is open 24 hours a day. Text the Crisis Text Line at (907) 753-4490. Summary Insomnia is a sleep disorder that makes it difficult to fall asleep or stay asleep. Insomnia can be long-term (chronic) or short-term (acute). Treatment for insomnia depends on  the cause. Treatment may focus on treating an underlying condition that is causing the insomnia. Keep a sleep diary to help you and your health care provider figure out what could be causing your insomnia. This information is not intended to replace advice given to you by your health care provider. Make sure you discuss any questions you have with your health care provider. Document Revised: 04/07/2021 Document Reviewed: 04/07/2021 Elsevier Patient Education  2024 [employer].

## 2022-10-11 ENCOUNTER — Encounter: Payer: Self-pay | Admitting: Family Medicine

## 2022-10-12 DIAGNOSIS — M5416 Radiculopathy, lumbar region: Secondary | ICD-10-CM | POA: Diagnosis not present

## 2022-10-12 DIAGNOSIS — M5459 Other low back pain: Secondary | ICD-10-CM | POA: Diagnosis not present

## 2022-10-13 DIAGNOSIS — Z6827 Body mass index (BMI) 27.0-27.9, adult: Secondary | ICD-10-CM | POA: Diagnosis not present

## 2022-10-13 DIAGNOSIS — M4722 Other spondylosis with radiculopathy, cervical region: Secondary | ICD-10-CM | POA: Diagnosis not present

## 2022-10-19 ENCOUNTER — Ambulatory Visit (HOSPITAL_COMMUNITY)
Admission: RE | Admit: 2022-10-19 | Discharge: 2022-10-19 | Disposition: A | Discharge status: Home / Self Care | Payer: Medicare HMO | Source: Ambulatory Visit | Attending: Gastroenterology | Admitting: Gastroenterology

## 2022-10-19 DIAGNOSIS — Z79899 Other long term (current) drug therapy: Secondary | ICD-10-CM | POA: Diagnosis not present

## 2022-10-19 DIAGNOSIS — R0789 Other chest pain: Secondary | ICD-10-CM | POA: Insufficient documentation

## 2022-10-19 DIAGNOSIS — R1310 Dysphagia, unspecified: Secondary | ICD-10-CM | POA: Insufficient documentation

## 2022-10-19 DIAGNOSIS — K219 Gastro-esophageal reflux disease without esophagitis: Secondary | ICD-10-CM | POA: Diagnosis not present

## 2022-10-19 DIAGNOSIS — K224 Dyskinesia of esophagus: Secondary | ICD-10-CM | POA: Diagnosis not present

## 2022-10-23 DIAGNOSIS — D485 Neoplasm of uncertain behavior of skin: Secondary | ICD-10-CM | POA: Diagnosis not present

## 2022-10-23 DIAGNOSIS — L538 Other specified erythematous conditions: Secondary | ICD-10-CM | POA: Diagnosis not present

## 2022-10-23 DIAGNOSIS — L820 Inflamed seborrheic keratosis: Secondary | ICD-10-CM | POA: Diagnosis not present

## 2022-10-23 DIAGNOSIS — C44519 Basal cell carcinoma of skin of other part of trunk: Secondary | ICD-10-CM | POA: Diagnosis not present

## 2022-10-23 DIAGNOSIS — L570 Actinic keratosis: Secondary | ICD-10-CM | POA: Diagnosis not present

## 2022-10-23 DIAGNOSIS — D225 Melanocytic nevi of trunk: Secondary | ICD-10-CM | POA: Diagnosis not present

## 2022-10-25 NOTE — Progress Notes (Unsigned)
Cardiology Office Note  Date:  10/26/2022   ID:  NEONA TALLENT, DOB 1951-03-28, MRN [id number]  PCP:  Shade Flood, MD   Chief Complaint  Patient presents with   Palpitations    Patient c/o palpitations mostly in the evenings before bedtime. Medications reviewed by the patient verbally.     HPI:  Maria Sweeney is a 72 yo woman with PMH of HTN  Hyperlipidemia,  intolerance to simvastatin/liptor Anxiety/depression Who presents for follow-up of her  progressive DOE, HTN, palpitations  Last seen in clinic 9/23  Sick early this year, getting over bronchitis Developed right side chest pain possibly exacerbated by coughing Developed some rib pain right side radiating through to her back  Also having some GERD symptoms  Appreciating palpitations,  at rest,  at night Does not want medications given symptoms are mild  Active Non-smoker Tolerating Crestor  Lab work reviewed,  A1c 6.7 Total cholesterol 178, triglycerides 304, LDL 99  EKG personally reviewed by myself on todays visit Sinus bradycardia rate 67 bpm no significant ST-T wave changes  Previously an accountant  family history Mother: smoker, CAD, MI   PMH:   has a past medical history of Anxiety, Arthritis, Asthma, Cataract, Cervical spondylosis, Complication of anesthesia, Depression, Depression, GERD (gastroesophageal reflux disease), Glaucoma, Hyperlipidemia, Hypertension, IBS (irritable bowel syndrome), and Oral herpes.  PSH:    Past Surgical History:  Procedure Laterality Date   ABDOMINAL HYSTERECTOMY  1978   DUB; ovaries intact.   APPENDECTOMY  1988   BREAST BIOPSY Bilateral 2009   Calcium Deposit- 1980's and also in 2009/ Right side 1995   BREAST EXCISIONAL BIOPSY Bilateral    BREAST SURGERY N/A    Phreesia 10/25/2019   CATARACT EXTRACTION W/PHACO Left 10/23/2020   Procedure: CATARACT EXTRACTION PHACO AND INTRAOCULAR LENS PLACEMENT (IOC) LEFT TORIC LENS KAHOOK DUAL BLADE GONIOTOMY,  VISION  BLUE 7.69 00:53.0;  Surgeon: Lockie Mola, MD;  Location: [facility];  Service: Ophthalmology;  Laterality: Left;   CATARACT EXTRACTION W/PHACO Right 11/20/2020   Procedure: CATARACT EXTRACTION PHACO AND INTRAOCULAR LENS PLACEMENT (IOC) RIGHT LENS KAHOOK DUAL BLADE GONIOTOMY VISION BLUE 8.58 01:24.9;  Surgeon: Lockie Mola, MD;  Location: [facility];  Service: Ophthalmology;  Laterality: Right;  latex   COLONOSCOPY  05/11/2009   Normal.  Previous colonoscopy with polyps. [place].     COLONOSCOPY WITH PROPOFOL N/A 12/20/2019   Procedure: COLONOSCOPY WITH PROPOFOL;  Surgeon: Toney Reil, MD;  Location: [facility];  Service: Gastroenterology;  Laterality: N/A;   DEBRIDEMENT TENNIS ELBOW     ROTATOR CUFF REPAIR     TONSILLECTOMY     UPPER GASTROINTESTINAL ENDOSCOPY      Current Outpatient Medications  Medication Sig Dispense Refill   albuterol (PROVENTIL HFA;VENTOLIN HFA) 108 (90 Base) MCG/ACT inhaler Inhale 2 puffs into the lungs every 4 (four) hours as needed for wheezing or shortness of breath (cough, shortness of breath or wheezing.). 1 Inhaler 1   dorzolamide-timolol (COSOPT) 22.3-6.8 MG/ML ophthalmic solution      famotidine (PEPCID) 40 MG tablet TAKE 1 TABLET BY MOUTH DAILY 90 tablet 0   FLUoxetine (PROZAC) 20 MG capsule TAKE THREE CAPSULES BY MOUTH DAILY 270 capsule 1   fluticasone (FLONASE) 50 MCG/ACT nasal spray Place 2 sprays into both nostrils daily. 16 g 6   Fluticasone Furoate (ARNUITY ELLIPTA) 100 MCG/ACT AEPB Inhale 100 mcg into the lungs daily as needed. 30 each 2   hydrOXYzine (ATARAX) 10 MG tablet Take 1 tablet (  10 mg total) by mouth at bedtime as needed. 30 tablet 0   latanoprost (XALATAN) 0.005 % ophthalmic solution 1 drop at bedtime.     Latanoprostene Bunod 0.024 % SOLN Place 1 drop into the left eye daily.     methylcellulose (CITRUCEL) oral powder Take 1 packet by mouth daily.     Omega-3 Fatty Acids (FISH OIL OMEGA-3) 1000  MG CAPS Take by mouth.     oxybutynin (DITROPAN-XL) 10 MG 24 hr tablet Take 10 mg by mouth at bedtime.     pantoprazole (PROTONIX) 40 MG tablet TAKE ONE TABLET BY MOUTH once to TWICE (2) DAILY BEFORE A MEAL 180 tablet 3   rosuvastatin (CRESTOR) 10 MG tablet TAKE ONE TABLET BY MOUTH DAILY 90 tablet 3   spironolactone (ALDACTONE) 50 MG tablet TAKE ONE TABLET BY MOUTH DAILY 90 tablet 1   VITAMIN D PO Take by mouth.     No current facility-administered medications for this visit.     Allergies:   Amoxicillin, Augmentin [amoxicillin-pot clavulanate], Clavulanic acid, Codeine, Depakote [divalproex sodium], Latex, Lipitor [atorvastatin], Penicillins, Sulfamethoxazole, Trimethoprim, Zocor [simvastatin], and Septra [sulfamethoxazole-trimethoprim]   Social History:  The patient  reports that she has never smoked. She has never used smokeless tobacco. She reports that she does not drink alcohol and does not use drugs.   Family History:   family history includes Asthma in her father; Breast cancer in her paternal grandmother; COPD in her father; Cancer in her mother; Colon cancer in her maternal aunt; Diabetes in her mother; Emphysema in her father; Glaucoma in her father and mother; Heart disease in her mother; Hyperlipidemia in her mother; Hypertension in her mother and sister; Stroke (age of onset: 22) in her mother.    Review of Systems: Review of Systems  Constitutional: Negative.   HENT: Negative.    Respiratory: Negative.    Cardiovascular: Negative.   Gastrointestinal: Negative.   Musculoskeletal: Negative.   Neurological: Negative.   Psychiatric/Behavioral: Negative.    All other systems reviewed and are negative.   PHYSICAL EXAM: VS:  BP (!) 140/60 (BP Location: Left Arm, Patient Position: Sitting, Cuff Size: Normal)   Pulse 67   Ht 5' (1.524 m)   Wt 144 lb (65.3 kg)   SpO2 96%   BMI 28.12 kg/m  , BMI Body mass index is 28.12 kg/m. Constitutional:  oriented to person, place, and  time. No distress.  HENT:  Head: Grossly normal Eyes:  no discharge. No scleral icterus.  Neck: No JVD, no carotid bruits  Cardiovascular: Regular rate and rhythm, no murmurs appreciated Pulmonary/Chest: Clear to auscultation bilaterally, no wheezes or rails Abdominal: Soft.  no distension.  no tenderness.  Musculoskeletal: Normal range of motion Neurological:  normal muscle tone. Coordination normal. No atrophy Skin: Skin warm and dry Psychiatric: normal affect, pleasant  Recent Labs: 09/25/2022: BUN 13; Creatinine, Ser 0.80; Hemoglobin 14.1; Platelets 285.0; Potassium 4.3; Sodium 140; TSH 1.86    Lipid Panel Lab Results  Component Value Date   CHOL 178 09/12/2021   HDL 47.30 09/12/2021   LDLCALC 98 06/13/2020   TRIG 304.0 (H) 09/12/2021      Wt Readings from Last 3 Encounters:  10/26/22 144 lb (65.3 kg)  10/09/22 146 lb 6.4 oz (66.4 kg)  10/08/22 147 lb 6 oz (66.8 kg)     ASSESSMENT AND PLAN:  Problem List Items Addressed This Visit       Cardiology Problems   Pure hypercholesterolemia   Essential hypertension, benign -  Primary     Other   Prediabetes   Other Visit Diagnoses     DOE (dyspnea on exertion)         Shortness of breath Stable Continue exercise program  Anxiety Managed by primary care, stable  Hyperlipidemia Tolerating Crestor 10 mg daily Reasonable lipid numbers  Hypertension Blood pressure is well controlled on today's visit. No changes made to the medications. Well-controlled at home  diabetes type II with no complications A1c 6.7, stable Diet controlled   Total encounter time more than 30 minutes  Greater than 50% was spent in counseling and coordination of care with the patient    Signed, Dossie Arbour, M.D., Ph.D. [facility] [place], [place] [phone]

## 2022-10-26 ENCOUNTER — Ambulatory Visit: Payer: Medicare HMO | Attending: Physician Assistant | Admitting: Cardiovascular Disease

## 2022-10-26 ENCOUNTER — Encounter: Payer: Self-pay | Admitting: Cardiovascular Disease

## 2022-10-26 VITALS — BP 140/60 | HR 67 | Ht 60.0 in | Wt 144.0 lb

## 2022-10-26 DIAGNOSIS — R7303 Prediabetes: Secondary | ICD-10-CM

## 2022-10-26 DIAGNOSIS — I10 Essential (primary) hypertension: Secondary | ICD-10-CM

## 2022-10-26 DIAGNOSIS — E7800 Pure hypercholesterolemia, unspecified: Secondary | ICD-10-CM

## 2022-10-26 DIAGNOSIS — R0609 Other forms of dyspnea: Secondary | ICD-10-CM

## 2022-10-26 NOTE — Patient Instructions (Signed)
Medication Instructions:  No changes  If you need a refill on your cardiac medications before your next appointment, please call your pharmacy.   Lab work: No new labs needed  Testing/Procedures: No new testing needed  Follow-Up: At [facility], you and your health needs are our priority.  As part of our continuing mission to provide you with exceptional heart care, we have created designated Provider Care Teams.  These Care Teams include your primary Cardiologist (physician) and Advanced Practice Providers (APPs -  Physician Assistants and Nurse Practitioners) who all work together to provide you with the care you need, when you need it.  You will need a follow up appointment in 12 months  Providers on your designated Care Team:   Christopher Berge, NP Ryan Dunn, PA-C Cadence Furth, PA-C  COVID-19 Vaccine Information can be found at: [email or web address] For questions related to vaccine distribution or appointments, please email [email or web address] or call [phone].   

## 2022-11-06 ENCOUNTER — Encounter: Payer: Self-pay | Admitting: Family Medicine

## 2022-11-06 ENCOUNTER — Ambulatory Visit (INDEPENDENT_AMBULATORY_CARE_PROVIDER_SITE_OTHER): Payer: Medicare HMO | Admitting: Family Medicine

## 2022-11-06 VITALS — BP 138/60 | HR 58 | Temp 97.9°F | Ht 60.0 in | Wt 146.6 lb

## 2022-11-06 DIAGNOSIS — F5104 Psychophysiologic insomnia: Secondary | ICD-10-CM

## 2022-11-06 DIAGNOSIS — R052 Subacute cough: Secondary | ICD-10-CM

## 2022-11-06 DIAGNOSIS — Z1231 Encounter for screening mammogram for malignant neoplasm of breast: Secondary | ICD-10-CM

## 2022-11-06 DIAGNOSIS — K219 Gastro-esophageal reflux disease without esophagitis: Secondary | ICD-10-CM | POA: Diagnosis not present

## 2022-11-06 MED ORDER — TRAZODONE HCL 50 MG PO TABS
25.0000 mg | ORAL_TABLET | Freq: Every evening | ORAL | 3 refills | Status: AC | PRN
Start: 2022-11-06 — End: ?

## 2022-11-06 MED ORDER — FAMOTIDINE 40 MG PO TABS: 40.0000 mg | ORAL_TABLET | Freq: Every day | ORAL | 1 refills | Status: DC

## 2022-11-06 NOTE — Progress Notes (Signed)
Subjective:  Patient ID: Maria Sweeney, female    DOB: 01-08-1951  Age: 72 y.o. MRN: [id number]  CC:  Chief Complaint  Patient presents with   Cough    Pt notes this is better not resolved but much more manageable    Insomnia    Pt reports about the same no meaningful changes     HPI [name] presents for   Cough Follow-up from May 31 visit.  Possible upper irritation from GERD, some improvement if albuterol.  Also thought to have component of reactive airway.  Started on [employer] with albuterol as needed.  Treated with Pepcid and Protonix with plan barium swallow from gastroenterology. Still on pepcid (needs RF) and protonix. Improving control of heartburn. Barium swallow June 10th. Esophageal dysmotility. Eating slower, more chewing. Option of esophageal manometry depending on how sx's are controlled.  Flonase has been helpful - using daily, less cough and no postnasal drip.  No fever/dyspnea.  Tried arnuity once - no side effects.   Insomnia Discussed May 31. Chronic issue, previously took mirtazapine, but felt too sedated/side effects.  On Prozac for depression with no change in symptoms on higher dosing than 40 mg.  Unsure if melatonin helpful previously.  Ambien with initial improvement then did not help.  Did report remote history of sleep study with trouble going to sleep but no apparent sleep apnea.  Reviewed sleep hygiene last visit, trial of hydroxyzine 10 mg. Still trouble getting to sleep. Took hydroxyzine 1 pill - seemed to help initial night, then no relief. Slight groggy next morning after taking. Feels some urinary hesitancy on hydroxyzine - unable to take with oxybutynin.   History Patient Active Problem List   Diagnosis Date Noted   Encounter for screening colonoscopy    Vitamin D deficiency 08/02/2018   Prediabetes 08/02/2018   Carpal tunnel syndrome 02/10/2018   Tinnitus 06/25/2016   Numbness 04/13/2016   Loss of smell 04/13/2016   Pure  hypercholesterolemia 11/30/2014   Degenerative disc disease, cervical 04/24/2014   Essential hypertension, benign 10/19/2013   Insomnia 10/19/2013   Herpes labialis 10/19/2013   Anxiety and depression 07/25/2012   GERD (gastroesophageal reflux disease) 07/25/2012   Past Medical History:  Diagnosis Date   Anxiety    Arthritis    Phreesia 04/12/2020   Asthma    Cataract    Phreesia 10/25/2019   Cervical spondylosis    Complication of anesthesia    with colonoscopy woke up twice   Depression    Depression    Phreesia 04/12/2020   GERD (gastroesophageal reflux disease)    Glaucoma    Phreesia 10/25/2019   Hyperlipidemia    Hypertension    IBS (irritable bowel syndrome)    Constipation with diarrhea.   Oral herpes    Past Surgical History:  Procedure Laterality Date   ABDOMINAL HYSTERECTOMY  1978   DUB; ovaries intact.   APPENDECTOMY  1988   BREAST BIOPSY Bilateral 2009   Calcium Deposit- 1980's and also in 2009/ Right side 1995   BREAST EXCISIONAL BIOPSY Bilateral    BREAST SURGERY N/A    Phreesia 10/25/2019   CATARACT EXTRACTION W/PHACO Left 10/23/2020   Procedure: CATARACT EXTRACTION PHACO AND INTRAOCULAR LENS PLACEMENT (IOC) LEFT TORIC LENS KAHOOK DUAL BLADE GONIOTOMY,  VISION BLUE 7.69 00:53.0;  Surgeon: Lockie Mola, MD;  Location: [facility];  Service: Ophthalmology;  Laterality: Left;   CATARACT EXTRACTION W/PHACO Right 11/20/2020   Procedure: CATARACT EXTRACTION PHACO AND INTRAOCULAR  LENS PLACEMENT (IOC) RIGHT LENS KAHOOK DUAL BLADE GONIOTOMY VISION BLUE 8.58 01:24.9;  Surgeon: Lockie Mola, MD;  Location: [facility];  Service: Ophthalmology;  Laterality: Right;  latex   COLONOSCOPY  05/11/2009   Normal.  Previous colonoscopy with polyps. [facility].     COLONOSCOPY WITH PROPOFOL N/A 12/20/2019   Procedure: COLONOSCOPY WITH PROPOFOL;  Surgeon: Toney Reil, MD;  Location: [facility];  Service: Gastroenterology;  Laterality:  N/A;   DEBRIDEMENT TENNIS ELBOW     ROTATOR CUFF REPAIR     TONSILLECTOMY     UPPER GASTROINTESTINAL ENDOSCOPY     Allergies  Allergen Reactions   Amoxicillin Anaphylaxis   Augmentin [Amoxicillin-Pot Clavulanate]     Numbness around mouth, throat tightness   Clavulanic Acid    Codeine Itching   Depakote [Divalproex Sodium]     hives   Latex Hives   Lipitor [Atorvastatin]     Muscles pain   Penicillins     Throat tightness, mouth numb   Sulfamethoxazole    Trimethoprim    Zocor [Simvastatin]     Muscle pain   Septra [Sulfamethoxazole-Trimethoprim] Rash   Prior to Admission medications   Medication Sig Start Date End Date Taking? Authorizing Provider  albuterol (PROVENTIL HFA;VENTOLIN HFA) 108 (90 Base) MCG/ACT inhaler Inhale 2 puffs into the lungs every 4 (four) hours as needed for wheezing or shortness of breath (cough, shortness of breath or wheezing.). 02/11/16   Ethelda Chick, MD  dorzolamide-timolol (COSOPT) 22.3-6.8 MG/ML ophthalmic solution  12/25/19   [provider]  famotidine (PEPCID) 40 MG tablet TAKE 1 TABLET BY MOUTH DAILY 05/25/22   Shade Flood, MD  FLUoxetine (PROZAC) 20 MG capsule TAKE THREE CAPSULES BY MOUTH DAILY 08/03/22   Shade Flood, MD  fluticasone (FLONASE) 50 MCG/ACT nasal spray Place 2 sprays into both nostrils daily. 09/25/22   Shade Flood, MD  Fluticasone Furoate (ARNUITY ELLIPTA) 100 MCG/ACT AEPB Inhale 100 mcg into the lungs daily as needed. 10/09/22   Shade Flood, MD  hydrOXYzine (ATARAX) 10 MG tablet Take 1 tablet (10 mg total) by mouth at bedtime as needed. 10/09/22   Shade Flood, MD  latanoprost (XALATAN) 0.005 % ophthalmic solution 1 drop at bedtime.    [provider]  Latanoprostene Bunod 0.024 % SOLN Place 1 drop into the left eye daily.    [provider]  methylcellulose (CITRUCEL) oral powder Take 1 packet by mouth daily. 03/20/22   Armbruster, Willaim Rayas, MD  Omega-3 Fatty Acids (FISH OIL  OMEGA-3) 1000 MG CAPS Take by mouth.    [provider]  oxybutynin (DITROPAN-XL) 10 MG 24 hr tablet Take 10 mg by mouth at bedtime. 05/23/22   [provider]  pantoprazole (PROTONIX) 40 MG tablet TAKE ONE TABLET BY MOUTH once to TWICE (2) DAILY BEFORE A MEAL 10/08/22   Armbruster, Willaim Rayas, MD  rosuvastatin (CRESTOR) 10 MG tablet TAKE ONE TABLET BY MOUTH DAILY 08/10/22   Shade Flood, MD  spironolactone (ALDACTONE) 50 MG tablet TAKE ONE TABLET BY MOUTH DAILY 08/10/22   Shade Flood, MD  VITAMIN D PO Take by mouth.    [provider]   Social History   Socioeconomic History   Marital status: Divorced    Spouse name: Not on file   Number of children: 2   Years of education: Bachelors   Highest education level: Bachelor's degree (e.g., BA, AB, BS)  Occupational History   Occupation: IT sales professional  Employer: [employer]    Comment: also works at [employer].   Occupation: retired  Tobacco Use   Smoking status: Never   Smokeless tobacco: Never  Vaping Use   Vaping Use: Never used  Substance and Sexual Activity   Alcohol use: No   Drug use: Never   Sexual activity: Yes    Partners: Male    Birth control/protection: Surgical, Post-menopausal  Other Topics Concern   Not on file  Social History Narrative   Marital status: divorced since 1999; dating seriously in 2019 since 2015.      Children: one son, one daughter; 2 grandchildren.      Lives: alone in house with 2 dogs     Employment:  Retired.  Needs to work in 2019.      Tobacco: none      Alcohol: rare      Drugs: none      Exercise:  Sporadic   Right-handed.   Occasional caffeine use.      ADLs: independent with ADLs; drives; no assistant devices.      Advanced Directives: none; FULL CODE.   Social Determinants of Health   Financial Resource Strain: Low Risk  (10/08/2022)   Overall Financial Resource Strain (CARDIA)    Difficulty of Paying Living Expenses: Not very hard  Food Insecurity:  No Food Insecurity (10/08/2022)   Hunger Vital Sign    Worried About Running Out of Food in the Last Year: Never true    Ran Out of Food in the Last Year: Never true  Transportation Needs: No Transportation Needs (10/08/2022)   PRAPARE - Administrator, Civil Service (Medical): No    Lack of Transportation (Non-Medical): No  Physical Activity: Insufficiently Active (10/08/2022)   Exercise Vital Sign    Days of Exercise per Week: 2 days    Minutes of Exercise per Session: 30 min  Stress: No Stress Concern Present (10/08/2022)   [employer] of Occupational Health - Occupational Stress Questionnaire    Feeling of Stress : Only a little  Recent Concern: Stress - Stress Concern Present (08/13/2022)   [employer] of Occupational Health - Occupational Stress Questionnaire    Feeling of Stress : To some extent  Social Connections: Moderately Integrated (10/08/2022)   Social Connection and Isolation Panel [NHANES]    Frequency of Communication with Friends and Family: More than three times a week    Frequency of Social Gatherings with Friends and Family: More than three times a week    Attends Religious Services: More than 4 times per year    Active Member of [employer] or Organizations: No    Attends Banker Meetings: Never    Marital Status: Living with partner  Recent Concern: Social Connections - Moderately Isolated (08/13/2022)   Social Connection and Isolation Panel [NHANES]    Frequency of Communication with Friends and Family: More than three times a week    Frequency of Social Gatherings with Friends and Family: More than three times a week    Attends Religious Services: More than 4 times per year    Active Member of [employer] or Organizations: No    Attends Banker Meetings: Never    Marital Status: Divorced  Catering manager Violence: Not At Risk (08/13/2022)   Humiliation, Afraid, Rape, and Kick questionnaire    Fear of Current or Ex-Partner: No     Emotionally Abused: No    Physically Abused: No    Sexually Abused: No  Review of Systems   Objective:   Vitals:   11/06/22 0938  BP: 138/60  Pulse: (!) 58  Temp: 97.9 F (36.6 C)  TempSrc: Temporal  SpO2: 97%  Weight: 146 lb 9.6 oz (66.5 kg)  Height: 5' (1.524 m)     Physical Exam Vitals reviewed.  Constitutional:      Appearance: Normal appearance. She is well-developed.  HENT:     Head: Normocephalic and atraumatic.  Eyes:     Conjunctiva/sclera: Conjunctivae normal.     Pupils: Pupils are equal, round, and reactive to light.  Neck:     Vascular: No carotid bruit.  Cardiovascular:     Rate and Rhythm: Normal rate and regular rhythm.     Heart sounds: Normal heart sounds.  Pulmonary:     Effort: Pulmonary effort is normal.     Breath sounds: Normal breath sounds.  Abdominal:     Palpations: Abdomen is soft. There is no pulsatile mass.     Tenderness: There is no abdominal tenderness.  Musculoskeletal:     Right lower leg: No edema.     Left lower leg: No edema.  Skin:    General: Skin is warm and dry.  Neurological:     Mental Status: She is alert and oriented to person, place, and time.  Psychiatric:        Mood and Affect: Mood normal.        Behavior: Behavior normal.        Assessment & Plan:  AILEY KNEPPER is a 72 y.o. female . Subacute cough  -Improved, continue steroid nasal spray with option of Arnuity Ellipta, RTC precautions.  Gastroesophageal reflux disease, unspecified whether esophagitis present - Plan: famotidine (PEPCID) 40 MG tablet  -Stable on meds, reordered Pepcid.  Option of other testing as above from GI with esophageal dysmotility.  Chronic insomnia - Plan: traZODone (DESYREL) 50 MG tablet  -Unfortunately some difficulty with controlling the sleep onset insomnia with various approaches as above.  Some sedation and minimal improvement on hydroxyzine.  We can try low-dose trazodone with potential risks of serotonin syndrome  with her Prozac, lowest effective dose of trazodone, stop if any new side effects or symptoms.  If this is ineffective may be best to refer to psychiatry to decide on further medication changes.  Continue sleep hygiene as discussed last visit with RTC precautions.  Encounter for screening mammogram for malignant neoplasm of breast - Plan: MM Digital Screening ordered.   Meds ordered this encounter  Medications   traZODone (DESYREL) 50 MG tablet    Sig: Take 0.5-1 tablets (25-50 mg total) by mouth at bedtime as needed for sleep.    Dispense:  30 tablet    Refill:  3   famotidine (PEPCID) 40 MG tablet    Sig: Take 1 tablet (40 mg total) by mouth daily.    Dispense:  90 tablet    Refill:  1   Patient Instructions  Continue flonase.  Arnuity inhaler can help if reactive airway or asthma type cough.  Ok to take daily if cough not continuing to improve.  Stop the hydroxyzine for now. Try low dose trazodone in evening to see if that is more effective. If no relief, it may be worth meeting with psychiatry to see if they recommend a change in meds - let me know and I can order that referral.   Hang in there!        Signed,   Meredith Staggers, MD [place]  Primary [facility], [facility] 11/06/22 10:36 AM

## 2022-11-06 NOTE — Patient Instructions (Addendum)
Continue flonase.  Arnuity inhaler can help if reactive airway or asthma type cough.  Ok to take daily if cough not continuing to improve.  Stop the hydroxyzine for now. Try low dose trazodone in evening to see if that is more effective. If no relief, it may be worth meeting with psychiatry to see if they recommend a change in meds - let me know and I can order that referral.   Hang in there!

## 2022-11-16 DIAGNOSIS — C44519 Basal cell carcinoma of skin of other part of trunk: Secondary | ICD-10-CM | POA: Diagnosis not present

## 2022-11-16 DIAGNOSIS — D235 Other benign neoplasm of skin of trunk: Secondary | ICD-10-CM | POA: Diagnosis not present

## 2022-11-24 DIAGNOSIS — N3946 Mixed incontinence: Secondary | ICD-10-CM | POA: Diagnosis not present

## 2022-11-24 DIAGNOSIS — N3944 Nocturnal enuresis: Secondary | ICD-10-CM | POA: Diagnosis not present

## 2022-11-24 DIAGNOSIS — M62838 Other muscle spasm: Secondary | ICD-10-CM | POA: Diagnosis not present

## 2022-11-24 DIAGNOSIS — M6281 Muscle weakness (generalized): Secondary | ICD-10-CM | POA: Diagnosis not present

## 2022-11-24 DIAGNOSIS — M6289 Other specified disorders of muscle: Secondary | ICD-10-CM | POA: Diagnosis not present

## 2022-12-03 DIAGNOSIS — N3946 Mixed incontinence: Secondary | ICD-10-CM | POA: Diagnosis not present

## 2022-12-03 DIAGNOSIS — R350 Frequency of micturition: Secondary | ICD-10-CM | POA: Diagnosis not present

## 2022-12-10 DIAGNOSIS — M62838 Other muscle spasm: Secondary | ICD-10-CM | POA: Diagnosis not present

## 2022-12-10 DIAGNOSIS — N3946 Mixed incontinence: Secondary | ICD-10-CM | POA: Diagnosis not present

## 2022-12-10 DIAGNOSIS — M6289 Other specified disorders of muscle: Secondary | ICD-10-CM | POA: Diagnosis not present

## 2022-12-10 DIAGNOSIS — M6281 Muscle weakness (generalized): Secondary | ICD-10-CM | POA: Diagnosis not present

## 2022-12-11 DIAGNOSIS — M79645 Pain in left finger(s): Secondary | ICD-10-CM | POA: Diagnosis not present

## 2022-12-22 DIAGNOSIS — M65312 Trigger thumb, left thumb: Secondary | ICD-10-CM | POA: Diagnosis not present

## 2022-12-22 DIAGNOSIS — M65331 Trigger finger, right middle finger: Secondary | ICD-10-CM | POA: Diagnosis not present

## 2022-12-24 ENCOUNTER — Encounter (INDEPENDENT_AMBULATORY_CARE_PROVIDER_SITE_OTHER): Payer: Self-pay

## 2023-01-12 DIAGNOSIS — M65341 Trigger finger, right ring finger: Secondary | ICD-10-CM | POA: Diagnosis not present

## 2023-01-25 DIAGNOSIS — H401133 Primary open-angle glaucoma, bilateral, severe stage: Secondary | ICD-10-CM | POA: Diagnosis not present

## 2023-01-25 DIAGNOSIS — Z961 Presence of intraocular lens: Secondary | ICD-10-CM | POA: Diagnosis not present

## 2023-01-25 DIAGNOSIS — H53002 Unspecified amblyopia, left eye: Secondary | ICD-10-CM | POA: Diagnosis not present

## 2023-02-05 ENCOUNTER — Encounter: Payer: Self-pay | Admitting: Family Medicine

## 2023-02-05 DIAGNOSIS — Z1231 Encounter for screening mammogram for malignant neoplasm of breast: Secondary | ICD-10-CM

## 2023-02-08 ENCOUNTER — Ambulatory Visit (INDEPENDENT_AMBULATORY_CARE_PROVIDER_SITE_OTHER): Payer: Medicare HMO | Admitting: Family Medicine

## 2023-02-08 ENCOUNTER — Encounter: Payer: Self-pay | Admitting: Family Medicine

## 2023-02-08 VITALS — BP 130/62 | HR 62 | Temp 98.3°F | Ht 60.0 in | Wt 146.0 lb

## 2023-02-08 DIAGNOSIS — N644 Mastodynia: Secondary | ICD-10-CM

## 2023-02-08 DIAGNOSIS — I10 Essential (primary) hypertension: Secondary | ICD-10-CM

## 2023-02-08 DIAGNOSIS — F32A Depression, unspecified: Secondary | ICD-10-CM | POA: Diagnosis not present

## 2023-02-08 DIAGNOSIS — F419 Anxiety disorder, unspecified: Secondary | ICD-10-CM

## 2023-02-08 DIAGNOSIS — K219 Gastro-esophageal reflux disease without esophagitis: Secondary | ICD-10-CM | POA: Diagnosis not present

## 2023-02-08 DIAGNOSIS — E7800 Pure hypercholesterolemia, unspecified: Secondary | ICD-10-CM

## 2023-02-08 DIAGNOSIS — R052 Subacute cough: Secondary | ICD-10-CM

## 2023-02-08 DIAGNOSIS — E785 Hyperlipidemia, unspecified: Secondary | ICD-10-CM | POA: Diagnosis not present

## 2023-02-08 DIAGNOSIS — F5104 Psychophysiologic insomnia: Secondary | ICD-10-CM

## 2023-02-08 DIAGNOSIS — Z23 Encounter for immunization: Secondary | ICD-10-CM

## 2023-02-08 DIAGNOSIS — R7303 Prediabetes: Secondary | ICD-10-CM

## 2023-02-08 LAB — COMPREHENSIVE METABOLIC PANEL
ALT: 60 U/L — ABNORMAL HIGH (ref 0–35)
AST: 60 U/L — ABNORMAL HIGH (ref 0–37)
Albumin: 4.6 g/dL (ref 3.5–5.2)
Alkaline Phosphatase: 92 U/L (ref 39–117)
BUN: 14 mg/dL (ref 6–23)
CO2: 28 meq/L (ref 19–32)
Calcium: 10.3 mg/dL (ref 8.4–10.5)
Chloride: 103 meq/L (ref 96–112)
Creatinine, Ser: 0.77 mg/dL (ref 0.40–1.20)
GFR: 76.76 mL/min (ref >60.00)
Glucose, Bld: 149 mg/dL — ABNORMAL HIGH (ref 70–99)
Potassium: 4.6 meq/L (ref 3.5–5.1)
Sodium: 139 meq/L (ref 135–145)
Total Bilirubin: 0.9 mg/dL (ref 0.2–1.2)
Total Protein: 7.2 g/dL (ref 6.0–8.3)

## 2023-02-08 LAB — LIPID PANEL
Cholesterol: 168 mg/dL (ref 0–200)
HDL: 51 mg/dL (ref >39.00)
LDL Cholesterol: 68 mg/dL (ref 0–99)
NonHDL: 116.89
Total CHOL/HDL Ratio: 3
Triglycerides: 242 mg/dL — ABNORMAL HIGH (ref 0.0–149.0)
VLDL: 48.4 mg/dL — ABNORMAL HIGH (ref 0.0–40.0)

## 2023-02-08 LAB — HEMOGLOBIN A1C: Hgb A1c MFr Bld: 8.1 % — ABNORMAL HIGH (ref 4.6–6.5)

## 2023-02-08 MED ORDER — FAMOTIDINE 40 MG PO TABS
40.0000 mg | ORAL_TABLET | Freq: Every day | ORAL | 1 refills | Status: DC
Start: 2023-02-08 — End: 2023-08-13

## 2023-02-08 MED ORDER — FLUOXETINE HCL 20 MG PO CAPS
40.0000 mg | ORAL_CAPSULE | Freq: Every day | ORAL | 1 refills | Status: DC
Start: 2023-02-08 — End: 2023-08-13

## 2023-02-08 MED ORDER — SPIRONOLACTONE 50 MG PO TABS
50.0000 mg | ORAL_TABLET | Freq: Every day | ORAL | 1 refills | Status: DC
Start: 2023-02-08 — End: 2023-08-13

## 2023-02-08 NOTE — Patient Instructions (Signed)
Continue same dose of cholesterol medication and blood pressure medicine for now.  I will check labs today.  I am also checking the prediabetes test.  Previous level was at level of diabetes but we will check repeat labs first and then can discuss if any changes needed.  See phone numbers below for possible therapists in the area but I do recommend meeting with someone to discuss sleep and see if they have other recommendations.  I do not recommend any new meds at this time.  No change in dose of fluoxetine for now.  I will refer you for a diagnostic mammogram and ultrasound for the areas of breast discomfort.  They should be contacting you within the next week.  Let me know if you have not heard from their office.  Follow-up at this point will be 6 months depending on your lab work.  Please let me know if there are questions and thank you again for coming in today.

## 2023-02-08 NOTE — Progress Notes (Signed)
Subjective:  Patient ID: Maria Sweeney, female    DOB: 07-08-1950  Age: 72 y.o. MRN: [id number]  CC:  Chief Complaint  Patient presents with   Medical Management of Chronic Issues    HPI Maria Sweeney presents for   Hyperlipidemia: Crestor 10 mg daily, no new side effects. No myalgias. Fasting today.  Lab Results  Component Value Date   CHOL 178 09/12/2021   HDL 47.30 09/12/2021   LDLCALC 98 06/13/2020   LDLDIRECT 99.0 09/12/2021   TRIG 304.0 (H) 09/12/2021   CHOLHDL 4 09/12/2021   Lab Results  Component Value Date   ALT 35 09/12/2021   AST 29 09/12/2021   ALKPHOS 91 09/12/2021   BILITOT 0.7 09/12/2021   Left breast pain: Chronic tenderness in breast. Fibrocystic breast in past. Past few weeks, R and L breast. Bump on left side. No bumps on R - just sore. Unable to have MM in February - planned reschedule but has not received call. No chest pain.  Prior breast biopsies benign. Mammogram diagnostic mammogram in 05/2021, with ultrasound:  IMPRESSION: 1. No evidence of malignancy within either breast. 2. Benign-appearing intramammary lymph node within the LEFT breast at the 2 o'clock axis, morphologically normal, corresponding to the palpable area of concern.   RECOMMENDATION: 1.  Screening mammogram in one year.(Code:[id number]-B-01Y) 2. Breast pain is a common condition, which will often resolve on its own without intervention. Benign causes of breast pain, and possible remedies, were discussed with the patient. The patient was instructed to return sooner if the area that she feels becomes larger and/or firmer to palpation, or if a new palpable abnormality is identified in either breast.    Hypertension: Treated with spironolactone 50 mg daily. No new side effects.  Home readings: none BP Readings from Last 3 Encounters:  02/08/23 130/62  11/06/22 138/60  10/26/22 (!) 140/60   Lab Results  Component Value Date   CREATININE 0.80 09/25/2022   Prediabetes: With  last A1c at diabetic level, no recent testing.  No home blood sugar readings.  Denies any new blurry vision, some chronic  urinary frequency.  No increased thirst.  No home readings.  Lab Results  Component Value Date   HGBA1C 6.7 (H) 09/12/2021   Wt Readings from Last 3 Encounters:  02/08/23 146 lb (66.2 kg)  11/06/22 146 lb 9.6 oz (66.5 kg)  10/26/22 144 lb (65.3 kg)    Depression with insomnia Chronic insomnia discussed at her June visit.  Mirtazapine caused too much sedation, side effects.  No change in symptom control on the doses of Prozac higher than 40 mg.  Ambien with initial improvement and did not help.  Unsure if melatonin was helpful.  No apparent sleep apnea on previous sleep study, had reported difficulty with sleep onset only.  Hydroxyzine with minimal relief and felt groggy the next morning, and difficulty with tolerating that medication with urinary hesitancy, use of oxybutynin.  Decided to try trazodone 25 to 50 mg nightly as needed.  Still on Prozac 40 mg daily. Depression symptoms stable. Minimal change in sleep with trazodone - used a few times. Defers further meds. Agrees to meet with  therapist.      02/08/2023   10:38 AM 11/06/2022    9:36 AM 09/25/2022   10:12 AM 08/13/2022    1:42 PM 12/05/2021   10:06 AM  Depression screen PHQ 2/9  Decreased Interest 0 1 1 1  0  Down, Depressed, Hopeless 0 1 1 1  0  PHQ - 2 Score 0 2 2 2  0  Altered sleeping 2 3 3 3 1   Tired, decreased energy 1 1 1 1 1   Change in appetite 0 0 0 0 0  Feeling bad or failure about yourself  0 0 0 0 0  Trouble concentrating 0 0 1 0 0  Moving slowly or fidgety/restless 0 0 0 0 0  Suicidal thoughts 0 0 0 0 0  PHQ-9 Score 3 6 7 6 2   Difficult doing work/chores Not difficult at all  Not difficult at all Somewhat difficult Not difficult at all   Chronic cough Improving in June.  History of Pepcid, Protonix for possible GERD, upper airway irritation component.  Arnuity Ellipta and albuterol for airway  management.  Flonase for upper airway.  Improving in June with less cough and less postnasal drip. Cough has resolved -only rare cough.  Still using protonix BID, pepcid, flonase. Only few times of arnuity ellipta needed.    History Patient Active Problem List   Diagnosis Date Noted   Encounter for screening colonoscopy    Vitamin D deficiency 08/02/2018   Prediabetes 08/02/2018   Carpal tunnel syndrome 02/10/2018   Tinnitus 06/25/2016   Numbness 04/13/2016   Loss of smell 04/13/2016   Pure hypercholesterolemia 11/30/2014   Degenerative disc disease, cervical 04/24/2014   Essential hypertension, benign 10/19/2013   Insomnia 10/19/2013   Herpes labialis 10/19/2013   Anxiety and depression 07/25/2012   GERD (gastroesophageal reflux disease) 07/25/2012   Past Medical History:  Diagnosis Date   Anxiety    Arthritis    Phreesia 04/12/2020   Asthma    Cataract    Phreesia 10/25/2019   Cervical spondylosis    Complication of anesthesia    with colonoscopy woke up twice   Depression    Depression    Phreesia 04/12/2020   GERD (gastroesophageal reflux disease)    Glaucoma    Phreesia 10/25/2019   Hyperlipidemia    Hypertension    IBS (irritable bowel syndrome)    Constipation with diarrhea.   Oral herpes    Past Surgical History:  Procedure Laterality Date   ABDOMINAL HYSTERECTOMY  1978   DUB; ovaries intact.   APPENDECTOMY  1988   BREAST BIOPSY Bilateral 2009   Calcium Deposit- 1980's and also in 2009/ Right side 1995   BREAST EXCISIONAL BIOPSY Bilateral    BREAST SURGERY N/A    Phreesia 10/25/2019   CATARACT EXTRACTION W/PHACO Left 10/23/2020   Procedure: CATARACT EXTRACTION PHACO AND INTRAOCULAR LENS PLACEMENT (IOC) LEFT TORIC LENS KAHOOK DUAL BLADE GONIOTOMY,  VISION BLUE 7.69 00:53.0;  Surgeon: Lockie Mola, MD;  Location: [facility];  Service: Ophthalmology;  Laterality: Left;   CATARACT EXTRACTION W/PHACO Right 11/20/2020   Procedure: CATARACT  EXTRACTION PHACO AND INTRAOCULAR LENS PLACEMENT (IOC) RIGHT LENS KAHOOK DUAL BLADE GONIOTOMY VISION BLUE 8.58 01:24.9;  Surgeon: Lockie Mola, MD;  Location: [facility];  Service: Ophthalmology;  Laterality: Right;  latex   COLONOSCOPY  05/11/2009   Normal.  Previous colonoscopy with polyps. [place].     COLONOSCOPY WITH PROPOFOL N/A 12/20/2019   Procedure: COLONOSCOPY WITH PROPOFOL;  Surgeon: Toney Reil, MD;  Location: [facility];  Service: Gastroenterology;  Laterality: N/A;   DEBRIDEMENT TENNIS ELBOW     ROTATOR CUFF REPAIR     TONSILLECTOMY     UPPER GASTROINTESTINAL ENDOSCOPY     Allergies  Allergen Reactions   Amoxicillin Anaphylaxis   Augmentin [Amoxicillin-Pot Clavulanate]  Numbness around mouth, throat tightness   Clavulanic Acid    Codeine Itching   Depakote [Divalproex Sodium]     hives   Latex Hives   Lipitor [Atorvastatin]     Muscles pain   Penicillins     Throat tightness, mouth numb   Sulfamethoxazole    Trimethoprim    Zocor [Simvastatin]     Muscle pain   Septra [Sulfamethoxazole-Trimethoprim] Rash   Prior to Admission medications   Medication Sig Start Date End Date Taking? Authorizing Provider  albuterol (PROVENTIL HFA;VENTOLIN HFA) 108 (90 Base) MCG/ACT inhaler Inhale 2 puffs into the lungs every 4 (four) hours as needed for wheezing or shortness of breath (cough, shortness of breath or wheezing.). 02/11/16  Yes Ethelda Chick, MD  dorzolamide-timolol (COSOPT) 22.3-6.8 MG/ML ophthalmic solution  12/25/19  Yes [provider]  famotidine (PEPCID) 40 MG tablet Take 1 tablet (40 mg total) by mouth daily. 11/06/22  Yes Shade Flood, MD  FLUoxetine (PROZAC) 20 MG capsule TAKE THREE CAPSULES BY MOUTH DAILY 08/03/22  Yes Shade Flood, MD  fluticasone [facility]) 50 MCG/ACT nasal spray Place 2 sprays into both nostrils daily. 09/25/22  Yes Shade Flood, MD  Fluticasone Furoate (ARNUITY ELLIPTA) 100 MCG/ACT AEPB  Inhale 100 mcg into the lungs daily as needed. 10/09/22  Yes Shade Flood, MD  hydrOXYzine (ATARAX) 10 MG tablet Take 1 tablet (10 mg total) by mouth at bedtime as needed. 10/09/22  Yes Shade Flood, MD  latanoprost (XALATAN) 0.005 % ophthalmic solution 1 drop at bedtime.   Yes [provider]  Latanoprostene Bunod 0.024 % SOLN Place 1 drop into the left eye daily.   Yes [provider]  methylcellulose (CITRUCEL) oral powder Take 1 packet by mouth daily. 03/20/22  Yes Armbruster, Willaim Rayas, MD  Omega-3 Fatty Acids (FISH OIL OMEGA-3) 1000 MG CAPS Take by mouth.   Yes [provider]  oxybutynin (DITROPAN-XL) 10 MG 24 hr tablet Take 10 mg by mouth at bedtime. 05/23/22  Yes [provider]  pantoprazole (PROTONIX) 40 MG tablet TAKE ONE TABLET BY MOUTH once to TWICE (2) DAILY BEFORE A MEAL 10/08/22  Yes Armbruster, Willaim Rayas, MD  rosuvastatin (CRESTOR) 10 MG tablet TAKE ONE TABLET BY MOUTH DAILY 08/10/22  Yes Shade Flood, MD  spironolactone (ALDACTONE) 50 MG tablet TAKE ONE TABLET BY MOUTH DAILY 08/10/22  Yes Shade Flood, MD  traZODone (DESYREL) 50 MG tablet Take 0.5-1 tablets (25-50 mg total) by mouth at bedtime as needed for sleep. 11/06/22  Yes Shade Flood, MD  VITAMIN D PO Take by mouth.   Yes [provider]   Social History   Socioeconomic History   Marital status: Divorced    Spouse name: Not on file   Number of children: 2   Years of education: Bachelors   Highest education level: Bachelor's degree (e.g., BA, AB, BS)  Occupational History   Occupation: Training and development officer: [employer]    Comment: also works at [employer].   Occupation: retired  Tobacco Use   Smoking status: Never   Smokeless tobacco: Never  Vaping Use   Vaping status: Never Used  Substance and Sexual Activity   Alcohol use: No   Drug use: Never   Sexual activity: Yes    Partners: Male    Birth control/protection: Surgical, Post-menopausal  Other  Topics Concern   Not on file  Social History Narrative   Marital status: divorced since 1999;  dating seriously in 2019 since 2015.      Children: one son, one daughter; 2 grandchildren.      Lives: alone in house with 2 dogs     Employment:  Retired.  Needs to work in 2019.      Tobacco: none      Alcohol: rare      Drugs: none      Exercise:  Sporadic   Right-handed.   Occasional caffeine use.      ADLs: independent with ADLs; drives; no assistant devices.      Advanced Directives: none; FULL CODE.   Social Determinants of Health   Financial Resource Strain: Low Risk  (10/08/2022)   Overall Financial Resource Strain (CARDIA)    Difficulty of Paying Living Expenses: Not very hard  Food Insecurity: No Food Insecurity (10/08/2022)   Hunger Vital Sign    Worried About Running Out of Food in the Last Year: Never true    Ran Out of Food in the Last Year: Never true  Transportation Needs: No Transportation Needs (10/08/2022)   PRAPARE - Administrator, Civil Service (Medical): No    Lack of Transportation (Non-Medical): No  Physical Activity: Insufficiently Active (10/08/2022)   Exercise Vital Sign    Days of Exercise per Week: 2 days    Minutes of Exercise per Session: 30 min  Stress: No Stress Concern Present (10/08/2022)   [employer] of Occupational Health - Occupational Stress Questionnaire    Feeling of Stress : Only a little  Recent Concern: Stress - Stress Concern Present (08/13/2022)   [employer] of Occupational Health - Occupational Stress Questionnaire    Feeling of Stress : To some extent  Social Connections: Moderately Integrated (10/08/2022)   Social Connection and Isolation Panel [NHANES]    Frequency of Communication with Friends and Family: More than three times a week    Frequency of Social Gatherings with Friends and Family: More than three times a week    Attends Religious Services: More than 4 times per year    Active Member of [employer] or  Organizations: No    Attends Banker Meetings: Never    Marital Status: Living with partner  Recent Concern: Social Connections - Moderately Isolated (08/13/2022)   Social Connection and Isolation Panel [NHANES]    Frequency of Communication with Friends and Family: More than three times a week    Frequency of Social Gatherings with Friends and Family: More than three times a week    Attends Religious Services: More than 4 times per year    Active Member of [employer] or Organizations: No    Attends Banker Meetings: Never    Marital Status: Divorced  Catering manager Violence: Not At Risk (08/13/2022)   Humiliation, Afraid, Rape, and Kick questionnaire    Fear of Current or Ex-Partner: No    Emotionally Abused: No    Physically Abused: No    Sexually Abused: No    Review of Systems  Constitutional:  Negative for fatigue and unexpected weight change.  Respiratory:  Negative for chest tightness and shortness of breath.   Cardiovascular:  Negative for chest pain, palpitations and leg swelling.  Gastrointestinal:  Negative for abdominal pain and blood in stool.  Neurological:  Negative for dizziness, syncope, light-headedness and headaches.     Objective:   Vitals:   02/08/23 1041  BP: 130/62  Pulse: 62  Temp: 98.3 F (36.8 C)  TempSrc: Oral  SpO2: 96%  Weight: 146 lb (66.2 kg)  Height: 5' (1.524 m)     Physical Exam Vitals reviewed. Exam conducted with a chaperone present Marvia Pickles).  Constitutional:      Appearance: Normal appearance. She is well-developed.  HENT:     Head: Normocephalic and atraumatic.  Eyes:     Conjunctiva/sclera: Conjunctivae normal.     Pupils: Pupils are equal, round, and reactive to light.  Neck:     Vascular: No carotid bruit.  Cardiovascular:     Rate and Rhythm: Normal rate and regular rhythm.     Heart sounds: Normal heart sounds.  Pulmonary:     Effort: Pulmonary effort is normal.     Breath sounds: Normal breath  sounds.  Chest:  Breasts:    Right: Tenderness (Tender to palpation at approximately 9:00 question firmness in this area.  No other appreciable areas of mass, no nipple discharge or skin changes.) present.     Left: Tenderness (Left side, palpable firm area at 12:00, tender to palpation.  No skin changes or nipple discharge.) present.  Abdominal:     Palpations: Abdomen is soft. There is no pulsatile mass.     Tenderness: There is no abdominal tenderness.  Musculoskeletal:     Right lower leg: No edema.     Left lower leg: No edema.  Skin:    General: Skin is warm and dry.  Neurological:     Mental Status: She is alert and oriented to person, place, and time.  Psychiatric:        Mood and Affect: Mood normal.        Behavior: Behavior normal.     49 minutes spent during visit, including chart review, counseling and assimilation of information, exam, discussion of plan including for additional concerns including breast pain as above, and chart completion.     Assessment & Plan:  Maria Sweeney is a 72 y.o. female . Essential hypertension - Plan: Comprehensive metabolic panel, spironolactone (ALDACTONE) 50 MG tablet  - Stable, tolerating current regimen. Medications refilled. Labs pending as above.   Need for immunization against influenza - Plan: Flu Vaccine Trivalent High Dose (Fluad)  Prediabetes - Plan: Comprehensive metabolic panel, Lipid panel, Hemoglobin A1c  -Prior A1c at diabetes check labs to determine plan,, follow-up interval accordingly.  Gastroesophageal reflux disease, unspecified whether esophagitis present - Plan: famotidine (PEPCID) 40 MG tablet  -Stable with Pepcid, continue same.  Hyperlipidemia, unspecified hyperlipidemia type - Plan: Comprehensive metabolic panel, Lipid panel  -No med changes at this time, check labs and adjust plan accordingly  Chronic insomnia  -Unfortunately insufficient relief or side effects with various options tried as above.   Decided against new medications at this time, and recommended pursuing meeting with therapist to review triggers, possible sleep psychologist.  Options provided on after visit summary.  Subacute cough  -Improved, continue reflux treatment as above, inhaler if needed, RTC precautions.  Breast pain - Plan: MM Digital Diagnostic Bilat, US BREAST COMPLETE UNI RIGHT INC AXILLA, US BREAST COMPLETE UNI LEFT INC AXILLA  -Left and right breast pain as above, check digital diagnostic mammogram and ultrasound.  Anxiety and depression - Plan: FLUoxetine (PROZAC) 20 MG capsule  -Overall stable, continue same dose fluoxetine at this time.  Pure hypercholesterolemia As above  Meds ordered this encounter  Medications   FLUoxetine (PROZAC) 20 MG capsule    Sig: Take 2 capsules (40 mg total) by mouth daily.    Dispense:  180 capsule    Refill:  1   famotidine (PEPCID) 40 MG tablet    Sig: Take 1 tablet (40 mg total) by mouth daily.    Dispense:  90 tablet    Refill:  1   spironolactone (ALDACTONE) 50 MG tablet    Sig: Take 1 tablet (50 mg total) by mouth daily.    Dispense:  90 tablet    Refill:  1   Patient Instructions  Continue same dose of cholesterol medication and blood pressure medicine for now.  I will check labs today.  I am also checking the prediabetes test.  Previous level was at level of diabetes but we will check repeat labs first and then can discuss if any changes needed.  See phone numbers below for possible therapists in the area but I do recommend meeting with someone to discuss sleep and see if they have other recommendations.  I do not recommend any new meds at this time.  No change in dose of fluoxetine for now.  I will refer you for a diagnostic mammogram and ultrasound for the areas of breast discomfort.  They should be contacting you within the next week.  Let me know if you have not heard from their office.  Follow-up at this point will be 6 months depending on your lab  work.  Please let me know if there are questions and thank you again for coming in today.     Signed,   Meredith Staggers, MD [facility], [facility] 02/08/23 11:04 AM

## 2023-02-24 ENCOUNTER — Other Ambulatory Visit: Payer: Self-pay | Admitting: Family Medicine

## 2023-02-24 DIAGNOSIS — Z1231 Encounter for screening mammogram for malignant neoplasm of breast: Secondary | ICD-10-CM

## 2023-02-24 DIAGNOSIS — N644 Mastodynia: Secondary | ICD-10-CM

## 2023-02-25 DIAGNOSIS — H401133 Primary open-angle glaucoma, bilateral, severe stage: Secondary | ICD-10-CM | POA: Diagnosis not present

## 2023-02-25 DIAGNOSIS — Z961 Presence of intraocular lens: Secondary | ICD-10-CM | POA: Diagnosis not present

## 2023-03-03 ENCOUNTER — Ambulatory Visit
Admission: RE | Admit: 2023-03-03 | Discharge: 2023-03-03 | Disposition: A | Discharge status: Home / Self Care | Payer: Medicare HMO | Source: Ambulatory Visit | Attending: Family Medicine | Admitting: Family Medicine

## 2023-03-03 DIAGNOSIS — Z1231 Encounter for screening mammogram for malignant neoplasm of breast: Secondary | ICD-10-CM

## 2023-03-03 DIAGNOSIS — N644 Mastodynia: Secondary | ICD-10-CM | POA: Insufficient documentation

## 2023-03-03 DIAGNOSIS — R92333 Mammographic heterogeneous density, bilateral breasts: Secondary | ICD-10-CM | POA: Diagnosis not present

## 2023-03-04 ENCOUNTER — Ambulatory Visit: Payer: Medicare HMO | Admitting: Family Medicine

## 2023-03-04 VITALS — BP 126/84 | HR 60 | Temp 98.3°F | Ht 60.0 in | Wt 143.0 lb

## 2023-03-04 DIAGNOSIS — E1165 Type 2 diabetes mellitus with hyperglycemia: Secondary | ICD-10-CM | POA: Diagnosis not present

## 2023-03-04 LAB — GLUCOSE, POCT (MANUAL RESULT ENTRY): POC Glucose: 116 mg/dL — AB (ref 70–99)

## 2023-03-04 NOTE — Patient Instructions (Addendum)
Great work on [employer] changes. I do think that will help with treatment of diabetes. Low dose metformin may also help sensitize your body to the insulin is making, but we can hold on that med for now. Recheck in 6 weeks with home readings (fasting or 2 hours after meals).   Type 2 Diabetes Mellitus, Diagnosis, Adult Type 2 diabetes (type 2 diabetes mellitus) is a long-term, or chronic, disease. In type 2 diabetes, one or both of these problems may be present: The pancreas does not make enough of a hormone called insulin. Cells in the body do not respond properly to the insulin that the body makes (insulin resistance). Normally, insulin allows blood sugar (glucose) to enter cells in the body. The cells use glucose for energy. Insulin resistance or lack of insulin causes excess glucose to build up in the blood instead of going into cells. This causes high blood glucose (hyperglycemia).  What are the causes? The exact cause of type 2 diabetes is not known. What increases the risk? The following factors may make you more likely to develop this condition: Having a family member with type 2 diabetes. Being overweight or obese. Being inactive (sedentary). Having been diagnosed with insulin resistance. Having a history of prediabetes, diabetes when you were pregnant (gestational diabetes), or polycystic ovary syndrome (PCOS). What are the signs or symptoms? In the early stage of this condition, you may not have symptoms. Symptoms develop slowly and may include: Increased thirst or hunger. Increased urination. Unexplained weight loss. Tiredness (fatigue) or weakness. Vision changes, such as blurry vision. Dark patches on the skin. How is this diagnosed? This condition is diagnosed based on your symptoms, your medical history, a physical exam, and your blood glucose level. Your blood glucose may be checked with one or more of the following blood tests: A fasting blood glucose (FBG) test. You will  not be allowed to eat (you will fast) for 8 hours or longer before a blood sample is taken. A random blood glucose test. This test checks blood glucose at any time of day regardless of when you ate. An A1C (hemoglobin A1C) blood test. This test provides information about blood glucose levels over the previous 2-3 months. An oral glucose tolerance test (OGTT). This test measures your blood glucose at two times: After fasting. This is your baseline blood glucose level. Two hours after drinking a beverage that contains glucose. You may be diagnosed with type 2 diabetes if: Your fasting blood glucose level is 126 mg/dL (7.0 mmol/L) or higher. Your random blood glucose level is 200 mg/dL (16.1 mmol/L) or higher. Your A1C level is 6.5% or higher. Your oral glucose tolerance test result is higher than 200 mg/dL (09.6 mmol/L). These blood tests may be repeated to confirm your diagnosis. How is this treated? Your treatment may be managed by a specialist called an endocrinologist. Type 2 diabetes may be treated by following instructions from your health care provider about: Making dietary and lifestyle changes. These may include: Following a personalized nutrition plan that is developed by a registered dietitian. Exercising regularly. Finding ways to manage stress. Checking your blood glucose level as often as told. Taking diabetes medicines or insulin daily. This helps to keep your blood glucose levels in the healthy range. Taking medicines to help prevent complications from diabetes. Medicines may include: Aspirin. Medicine to lower cholesterol. Medicine to control blood pressure. Your health care provider will set treatment goals for you. Your goals will be based on your age, other  medical conditions you have, and how you respond to diabetes treatment. Generally, the goal of treatment is to maintain the following blood glucose levels: Before meals: 80-130 mg/dL (1.3-0.8 mmol/L). After meals: below  180 mg/dL (10 mmol/L). A1C level: less than 7%. Follow these instructions at home: Questions to ask your health care provider Consider asking the following questions: Should I meet with a certified diabetes care and education specialist? What diabetes medicines do I need, and when should I take them? What equipment will I need to manage my diabetes at home? How often do I need to check my blood glucose? Where can I find a support group for people with diabetes? What number can I call if I have questions? When is my next appointment? General instructions Take over-the-counter and prescription medicines only as told by your health care provider. Keep all follow-up visits. This is important. Where to find more information For help and guidance and for more information about diabetes, please visit: American Diabetes Association (ADA): www.diabetes.org American Association of Diabetes Care and Education Specialists (ADCES): www.diabeteseducator.org International Diabetes Federation (IDF): DCOnly.dk Contact a health care provider if: Your blood glucose is at or above 240 mg/dL (65.7 mmol/L) for 2 days in a row. You have been sick or have had a fever for 2 days or longer, and you are not getting better. You have any of the following problems for more than 6 hours: You cannot eat or drink. You have nausea and vomiting. You have diarrhea. Get help right away if: You have severe hypoglycemia. This means your blood glucose is lower than 54 mg/dL (3.0 mmol/L). You become confused or you have trouble thinking clearly. You have difficulty breathing. You have moderate or large ketone levels in your urine. These symptoms may represent a serious problem that is an emergency. Do not wait to see if the symptoms will go away. Get medical help right away. Call your local emergency services (911 in the [place] Do not drive yourself to the hospital. Summary Type 2 diabetes mellitus is a long-term, or  chronic, disease. In type 2 diabetes, the pancreas does not make enough of a hormone called insulin, or cells in the body do not respond properly to insulin that the body makes. This condition is treated by making dietary and lifestyle changes and taking diabetes medicines or insulin. Your health care provider will set treatment goals for you. Your goals will be based on your age, other medical conditions you have, and how you respond to diabetes treatment. Keep all follow-up visits. This is important. This information is not intended to replace advice given to you by your health care provider. Make sure you discuss any questions you have with your health care provider. Document Revised: 07/22/2020 Document Reviewed: 07/22/2020 Elsevier Patient Education  2024 [employer]  Diabetes Mellitus and Nutrition, Adult When you have diabetes, or diabetes mellitus, it is very important to have healthy eating habits because your blood sugar (glucose) levels are greatly affected by what you eat and drink. Eating healthy foods in the right amounts, at about the same times every day, can help you: Manage your blood glucose. Lower your risk of heart disease. Improve your blood pressure. Reach or maintain a healthy weight. What can affect my meal plan? Every person with diabetes is different, and each person has different needs for a meal plan. Your health care provider may recommend that you work with a dietitian to make a meal plan that is best for you. Your meal  plan may vary depending on factors such as: The calories you need. The medicines you take. Your weight. Your blood glucose, blood pressure, and cholesterol levels. Your activity level. Other health conditions you have, such as heart or kidney disease. How do carbohydrates affect me? Carbohydrates, also called carbs, affect your blood glucose level more than any other type of food. Eating carbs raises the amount of glucose in your blood. It is  important to know how many carbs you can safely have in each meal. This is different for every person. Your dietitian can help you calculate how many carbs you should have at each meal and for each snack. How does alcohol affect me? Alcohol can cause a decrease in blood glucose (hypoglycemia), especially if you use insulin or take certain diabetes medicines by mouth. Hypoglycemia can be a life-threatening condition. Symptoms of hypoglycemia, such as sleepiness, dizziness, and confusion, are similar to symptoms of having too much alcohol. Do not drink alcohol if: Your health care provider tells you not to drink. You are pregnant, may be pregnant, or are planning to become pregnant. If you drink alcohol: Limit how much you have to: 0-1 drink a day for women. 0-2 drinks a day for men. Know how much alcohol is in your drink. In the [place] one drink equals one 12 oz bottle of beer (355 mL), one 5 oz glass of wine (148 mL), or one 1 oz glass of hard liquor (44 mL). Keep yourself hydrated with water, diet soda, or unsweetened iced tea. Keep in mind that regular soda, juice, and other mixers may contain a lot of sugar and must be counted as carbs. What are tips for following this plan?  Reading food labels Start by checking the serving size on the Nutrition Facts label of packaged foods and drinks. The number of calories and the amount of carbs, fats, and other nutrients listed on the label are based on one serving of the item. Many items contain more than one serving per package. Check the total grams (g) of carbs in one serving. Check the number of grams of saturated fats and trans fats in one serving. Choose foods that have a low amount or none of these fats. Check the number of milligrams (mg) of salt (sodium) in one serving. Most people should limit total sodium intake to less than 2,300 mg per day. Always check the nutrition information of foods labeled as "low-fat" or "nonfat." These foods may be  higher in added sugar or refined carbs and should be avoided. Talk to your dietitian to identify your daily goals for nutrients listed on the label. Shopping Avoid buying canned, pre-made, or processed foods. These foods tend to be high in fat, sodium, and added sugar. Shop around the outside edge of the grocery store. This is where you will most often find fresh fruits and vegetables, bulk grains, fresh meats, and fresh dairy products. Cooking Use low-heat cooking methods, such as baking, instead of high-heat cooking methods, such as deep frying. Cook using healthy oils, such as olive, canola, or sunflower oil. Avoid cooking with butter, cream, or high-fat meats. Meal planning Eat meals and snacks regularly, preferably at the same times every day. Avoid going long periods of time without eating. Eat foods that are high in fiber, such as fresh fruits, vegetables, beans, and whole grains. Eat 4-6 oz (112-168 g) of lean protein each day, such as lean meat, chicken, fish, eggs, or tofu. One ounce (oz) (28 g) of lean protein is  equal to: 1 oz (28 g) of meat, chicken, or fish. 1 egg.  cup (62 g) of tofu. Eat some foods each day that contain healthy fats, such as avocado, nuts, seeds, and fish. What foods should I eat? Fruits Berries. Apples. Oranges. Peaches. Apricots. Plums. Grapes. Mangoes. Papayas. Pomegranates. Kiwi. Cherries. Vegetables Leafy greens, including lettuce, spinach, kale, chard, collard greens, mustard greens, and cabbage. Beets. Cauliflower. Broccoli. Carrots. Green beans. Tomatoes. Peppers. Onions. Cucumbers. Brussels sprouts. Grains Whole grains, such as whole-wheat or whole-grain bread, crackers, tortillas, cereal, and pasta. Unsweetened oatmeal. Quinoa. Brown or wild rice. Meats and other proteins Seafood. Poultry without skin. Lean cuts of poultry and beef. Tofu. Nuts. Seeds. Dairy Low-fat or fat-free dairy products such as milk, yogurt, and cheese. The items listed  above may not be a complete list of foods and beverages you can eat and drink. Contact a dietitian for more information. What foods should I avoid? Fruits Fruits canned with syrup. Vegetables Canned vegetables. Frozen vegetables with butter or cream sauce. Grains Refined white flour and flour products such as bread, pasta, snack foods, and cereals. Avoid all processed foods. Meats and other proteins Fatty cuts of meat. Poultry with skin. Breaded or fried meats. Processed meat. Avoid saturated fats. Dairy Full-fat yogurt, cheese, or milk. Beverages Sweetened drinks, such as soda or iced tea. The items listed above may not be a complete list of foods and beverages you should avoid. Contact a dietitian for more information. Questions to ask a health care provider Do I need to meet with a certified diabetes care and education specialist? Do I need to meet with a dietitian? What number can I call if I have questions? When are the best times to check my blood glucose? Where to find more information: American Diabetes Association: diabetes.org Academy of Nutrition and Dietetics: eatright.[employer] of Diabetes and Digestive and Kidney Diseases: StageSync.si Association of Diabetes Care & Education Specialists: diabeteseducator.org Summary It is important to have healthy eating habits because your blood sugar (glucose) levels are greatly affected by what you eat and drink. It is important to use alcohol carefully. A healthy meal plan will help you manage your blood glucose and lower your risk of heart disease. Your health care provider may recommend that you work with a dietitian to make a meal plan that is best for you. This information is not intended to replace advice given to you by your health care provider. Make sure you discuss any questions you have with your health care provider. Document Revised: 11/29/2019 Document Reviewed: 11/29/2019 Elsevier Patient Education  2024  [employer].

## 2023-03-04 NOTE — Progress Notes (Signed)
Subjective:  Patient ID: Maria Sweeney, female    DOB: [birth year]  Age: 72 y.o. MRN: [id number]  CC:  Chief Complaint  Patient presents with   Follow-up    Lab recheck and discuss sugars levels they were high    HPI [name] presents for  Follow-up from September 30 visit  Hyperglycemia with history of prediabetes No meds as of her last visit.  Unfortunately A1c was high at 8.1.  Glucose in office at that time 149. Had been eating more sugary foods prior to last visit. Has cut sugar in diet, no sweet drinks, eating better. Has lost a few pounds.  Home blood sugar 174 fasting a few days ago. Hiking for exercise and increased walking.  No n/v/abd pain/ or new blurry vision.  Fasting today.  Lab Results  Component Value Date   HGBA1C 8.1 (H) 02/08/2023   Wt Readings from Last 3 Encounters:  03/04/23 143 lb (64.9 kg)  02/08/23 146 lb (66.2 kg)  11/06/22 146 lb 9.6 oz (66.5 kg)     Mammogram performed yesterday for area of concern/soreness. No concerns, recommend repeat mammogram in 1 year, symptomatic care discussed for breast pain. Feels better about these results.   History Patient Active Problem List   Diagnosis Date Noted   Encounter for screening colonoscopy    Vitamin D deficiency 08/02/2018   Prediabetes 08/02/2018   Carpal tunnel syndrome 02/10/2018   Tinnitus 06/25/2016   Numbness 04/13/2016   Loss of smell 04/13/2016   Pure hypercholesterolemia 11/30/2014   Degenerative disc disease, cervical 04/24/2014   Essential hypertension, benign 10/19/2013   Insomnia 10/19/2013   Herpes labialis 10/19/2013   Anxiety and depression 07/25/2012   GERD (gastroesophageal reflux disease) 07/25/2012   Past Medical History:  Diagnosis Date   Anxiety    Arthritis    Phreesia 04/12/2020   Asthma    Cataract    Phreesia 10/25/2019   Cervical spondylosis    Complication of anesthesia    with colonoscopy woke up twice   Depression    Depression    Phreesia  04/12/2020   GERD (gastroesophageal reflux disease)    Glaucoma    Phreesia 10/25/2019   Hyperlipidemia    Hypertension    IBS (irritable bowel syndrome)    Constipation with diarrhea.   Oral herpes    Past Surgical History:  Procedure Laterality Date   ABDOMINAL HYSTERECTOMY  1978   DUB; ovaries intact.   APPENDECTOMY  1988   BREAST BIOPSY Bilateral 2009   Calcium Deposit- 1980's and also in 2009/ Right side 1995   BREAST EXCISIONAL BIOPSY Bilateral    BREAST SURGERY N/A    Phreesia 10/25/2019   CATARACT EXTRACTION W/PHACO Left 10/23/2020   Procedure: CATARACT EXTRACTION PHACO AND INTRAOCULAR LENS PLACEMENT (IOC) LEFT TORIC LENS KAHOOK DUAL BLADE GONIOTOMY,  VISION BLUE 7.69 00:53.0;  Surgeon: Lockie Mola, MD;  Location: [facility];  Service: Ophthalmology;  Laterality: Left;   CATARACT EXTRACTION W/PHACO Right 11/20/2020   Procedure: CATARACT EXTRACTION PHACO AND INTRAOCULAR LENS PLACEMENT (IOC) RIGHT LENS KAHOOK DUAL BLADE GONIOTOMY VISION BLUE 8.58 01:24.9;  Surgeon: Lockie Mola, MD;  Location: [facility];  Service: Ophthalmology;  Laterality: Right;  latex   COLONOSCOPY  05/11/2009   Normal.  Previous colonoscopy with polyps. [facility].     COLONOSCOPY WITH PROPOFOL N/A 12/20/2019   Procedure: COLONOSCOPY WITH PROPOFOL;  Surgeon: Toney Reil, MD;  Location: [facility];  Service: Gastroenterology;  Laterality: N/A;  DEBRIDEMENT TENNIS ELBOW     ROTATOR CUFF REPAIR     TONSILLECTOMY     UPPER GASTROINTESTINAL ENDOSCOPY     Allergies  Allergen Reactions   Amoxicillin Anaphylaxis   Augmentin [Amoxicillin-Pot Clavulanate]     Numbness around mouth, throat tightness   Clavulanic Acid    Codeine Itching   Depakote [Divalproex Sodium]     hives   Latex Hives   Lipitor [Atorvastatin]     Muscles pain   Penicillins     Throat tightness, mouth numb   Sulfamethoxazole    Trimethoprim    Zocor [Simvastatin]     Muscle pain    Septra [Sulfamethoxazole-Trimethoprim] Rash   Prior to Admission medications   Medication Sig Start Date End Date Taking? Authorizing Provider  albuterol (PROVENTIL HFA;VENTOLIN HFA) 108 (90 Base) MCG/ACT inhaler Inhale 2 puffs into the lungs every 4 (four) hours as needed for wheezing or shortness of breath (cough, shortness of breath or wheezing.). 02/11/16  Yes Ethelda Chick, MD  dorzolamide-timolol (COSOPT) 22.3-6.8 MG/ML ophthalmic solution  12/25/19  Yes [provider]  famotidine (PEPCID) 40 MG tablet Take 1 tablet (40 mg total) by mouth daily. 02/08/23  Yes Shade Flood, MD  FLUoxetine (PROZAC) 20 MG capsule Take 2 capsules (40 mg total) by mouth daily. 02/08/23  Yes Shade Flood, MD  fluticasone (FLONASE) 50 MCG/ACT nasal spray Place 2 sprays into both nostrils daily. 09/25/22  Yes Shade Flood, MD  Fluticasone Furoate (ARNUITY ELLIPTA) 100 MCG/ACT AEPB Inhale 100 mcg into the lungs daily as needed. 10/09/22  Yes Shade Flood, MD  latanoprost (XALATAN) 0.005 % ophthalmic solution 1 drop at bedtime.   Yes [provider]  Latanoprostene Bunod 0.024 % SOLN Place 1 drop into the left eye daily.   Yes [provider]  methylcellulose (CITRUCEL) oral powder Take 1 packet by mouth daily. 03/20/22  Yes Armbruster, Willaim Rayas, MD  Omega-3 Fatty Acids (FISH OIL OMEGA-3) 1000 MG CAPS Take by mouth.   Yes [provider]  pantoprazole (PROTONIX) 40 MG tablet TAKE ONE TABLET BY MOUTH once to TWICE (2) DAILY BEFORE A MEAL 10/08/22  Yes Armbruster, Willaim Rayas, MD  rosuvastatin (CRESTOR) 10 MG tablet TAKE ONE TABLET BY MOUTH DAILY 08/10/22  Yes Shade Flood, MD  spironolactone (ALDACTONE) 50 MG tablet Take 1 tablet (50 mg total) by mouth daily. 02/08/23  Yes Shade Flood, MD  VITAMIN D PO Take by mouth.   Yes [provider]   Social History   Socioeconomic History   Marital status: Divorced    Spouse name: Not on file   Number of  children: 2   Years of education: Bachelors   Highest education level: Bachelor's degree (e.g., BA, AB, BS)  Occupational History   Occupation: Training and development officer: [employer]    Comment: also works at [employer].   Occupation: retired  Tobacco Use   Smoking status: Never   Smokeless tobacco: Never  Vaping Use   Vaping status: Never Used  Substance and Sexual Activity   Alcohol use: No   Drug use: Never   Sexual activity: Yes    Partners: Male    Birth control/protection: Surgical, Post-menopausal  Other Topics Concern   Not on file  Social History Narrative   Marital status: divorced since 1999; dating seriously in 2019 since 2015.      Children: one son, one daughter; 2 grandchildren.      Lives:  alone in house with 2 dogs     Employment:  Retired.  Needs to work in 2019.      Tobacco: none      Alcohol: rare      Drugs: none      Exercise:  Sporadic   Right-handed.   Occasional caffeine use.      ADLs: independent with ADLs; drives; no assistant devices.      Advanced Directives: none; FULL CODE.   Social Determinants of Health   Financial Resource Strain: Low Risk  (03/04/2023)   Overall Financial Resource Strain (CARDIA)    Difficulty of Paying Living Expenses: Not hard at all  Food Insecurity: No Food Insecurity (03/04/2023)   Hunger Vital Sign    Worried About Running Out of Food in the Last Year: Never true    Ran Out of Food in the Last Year: Never true  Transportation Needs: No Transportation Needs (03/04/2023)   PRAPARE - Administrator, Civil Service (Medical): No    Lack of Transportation (Non-Medical): No  Physical Activity: Insufficiently Active (03/04/2023)   Exercise Vital Sign    Days of Exercise per Week: 3 days    Minutes of Exercise per Session: 40 min  Stress: No Stress Concern Present (03/04/2023)   [employer] of Occupational Health - Occupational Stress Questionnaire    Feeling of Stress : Not at all  Social  Connections: Moderately Integrated (03/04/2023)   Social Connection and Isolation Panel [NHANES]    Frequency of Communication with Friends and Family: More than three times a week    Frequency of Social Gatherings with Friends and Family: Three times a week    Attends Religious Services: More than 4 times per year    Active Member of Clubs or Organizations: No    Attends Banker Meetings: Never    Marital Status: Living with partner  Intimate Partner Violence: Not At Risk (08/13/2022)   Humiliation, Afraid, Rape, and Kick questionnaire    Fear of Current or Ex-Partner: No    Emotionally Abused: No    Physically Abused: No    Sexually Abused: No    Review of Systems   Objective:   Vitals:   03/04/23 1300  BP: 126/84  Pulse: 60  Temp: 98.3 F (36.8 C)  TempSrc: Oral  SpO2: 95%  Weight: 143 lb (64.9 kg)  Height: 5' (1.524 m)    Physical Exam Vitals reviewed.  Constitutional:      Appearance: Normal appearance. She is well-developed.  HENT:     Head: Normocephalic and atraumatic.  Eyes:     Conjunctiva/sclera: Conjunctivae normal.     Pupils: Pupils are equal, round, and reactive to light.  Neck:     Vascular: No carotid bruit.  Cardiovascular:     Rate and Rhythm: Normal rate and regular rhythm.     Heart sounds: Normal heart sounds.  Pulmonary:     Effort: Pulmonary effort is normal.     Breath sounds: Normal breath sounds.  Abdominal:     Palpations: Abdomen is soft. There is no pulsatile mass.     Tenderness: There is no abdominal tenderness.  Musculoskeletal:     Right lower leg: No edema.     Left lower leg: No edema.  Skin:    General: Skin is warm and dry.  Neurological:     Mental Status: She is alert and oriented to person, place, and time.  Psychiatric:  Mood and Affect: Mood normal.        Behavior: Behavior normal.    Poct glucose 116.  Results for orders placed or performed in visit on 03/04/23  POCT glucose (manual entry)   Result Value Ref Range   POC Glucose 116 (A) 70 - 99 mg/dl     Assessment & Plan:  JARYIA HYAMS is a 72 y.o. female . Type 2 diabetes mellitus with hyperglycemia, without long-term current use of insulin (HCC) - Plan: POCT glucose (manual entry) Prior prediabetes but A1c has been floating around the diabetes range previously.  More elevated in September but did report decreased diet adherence.  Has made significant changes in her diet and activity, exercise with a few pounds weight loss already.  Asymptomatic.  -Based on her significant changes above and in office reading today we will hold on new meds, with recheck in 6 weeks.  She has been monitoring blood sugars and RTC precautions given.  Would consider low-dose metformin if persistent elevated readings.  Handout given below on diabetes diagnosis and nutrition.  No orders of the defined types were placed in this encounter.  Patient Instructions  Randie Heinz work on the diet changes. I do think that will help with treatment of diabetes. Low dose metformin may also help sensitize your body to the insulin is making, but we can hold on that med for now. Recheck in 6 weeks with home readings (fasting or 2 hours after meals).   Type 2 Diabetes Mellitus, Diagnosis, Adult Type 2 diabetes (type 2 diabetes mellitus) is a long-term, or chronic, disease. In type 2 diabetes, one or both of these problems may be present: The pancreas does not make enough of a hormone called insulin. Cells in the body do not respond properly to the insulin that the body makes (insulin resistance). Normally, insulin allows blood sugar (glucose) to enter cells in the body. The cells use glucose for energy. Insulin resistance or lack of insulin causes excess glucose to build up in the blood instead of going into cells. This causes high blood glucose (hyperglycemia).  What are the causes? The exact cause of type 2 diabetes is not known. What increases the risk? The following  factors may make you more likely to develop this condition: Having a family member with type 2 diabetes. Being overweight or obese. Being inactive (sedentary). Having been diagnosed with insulin resistance. Having a history of prediabetes, diabetes when you were pregnant (gestational diabetes), or polycystic ovary syndrome (PCOS). What are the signs or symptoms? In the early stage of this condition, you may not have symptoms. Symptoms develop slowly and may include: Increased thirst or hunger. Increased urination. Unexplained weight loss. Tiredness (fatigue) or weakness. Vision changes, such as blurry vision. Dark patches on the skin. How is this diagnosed? This condition is diagnosed based on your symptoms, your medical history, a physical exam, and your blood glucose level. Your blood glucose may be checked with one or more of the following blood tests: A fasting blood glucose (FBG) test. You will not be allowed to eat (you will fast) for 8 hours or longer before a blood sample is taken. A random blood glucose test. This test checks blood glucose at any time of day regardless of when you ate. An A1C (hemoglobin A1C) blood test. This test provides information about blood glucose levels over the previous 2-3 months. An oral glucose tolerance test (OGTT). This test measures your blood glucose at two times: After fasting. This is your  baseline blood glucose level. Two hours after drinking a beverage that contains glucose. You may be diagnosed with type 2 diabetes if: Your fasting blood glucose level is 126 mg/dL (7.0 mmol/L) or higher. Your random blood glucose level is 200 mg/dL (16.1 mmol/L) or higher. Your A1C level is 6.5% or higher. Your oral glucose tolerance test result is higher than 200 mg/dL (09.6 mmol/L). These blood tests may be repeated to confirm your diagnosis. How is this treated? Your treatment may be managed by a specialist called an endocrinologist. Type 2 diabetes may  be treated by following instructions from your health care provider about: Making dietary and lifestyle changes. These may include: Following a personalized nutrition plan that is developed by a registered dietitian. Exercising regularly. Finding ways to manage stress. Checking your blood glucose level as often as told. Taking diabetes medicines or insulin daily. This helps to keep your blood glucose levels in the healthy range. Taking medicines to help prevent complications from diabetes. Medicines may include: Aspirin. Medicine to lower cholesterol. Medicine to control blood pressure. Your health care provider will set treatment goals for you. Your goals will be based on your age, other medical conditions you have, and how you respond to diabetes treatment. Generally, the goal of treatment is to maintain the following blood glucose levels: Before meals: 80-130 mg/dL (0.4-5.4 mmol/L). After meals: below 180 mg/dL (10 mmol/L). A1C level: less than 7%. Follow these instructions at home: Questions to ask your health care provider Consider asking the following questions: Should I meet with a certified diabetes care and education specialist? What diabetes medicines do I need, and when should I take them? What equipment will I need to manage my diabetes at home? How often do I need to check my blood glucose? Where can I find a support group for people with diabetes? What number can I call if I have questions? When is my next appointment? General instructions Take over-the-counter and prescription medicines only as told by your health care provider. Keep all follow-up visits. This is important. Where to find more information For help and guidance and for more information about diabetes, please visit: American Diabetes Association (ADA): www.diabetes.org American Association of Diabetes Care and Education Specialists (ADCES): www.diabeteseducator.org International Diabetes Federation (IDF):  DCOnly.dk Contact a health care provider if: Your blood glucose is at or above 240 mg/dL (09.8 mmol/L) for 2 days in a row. You have been sick or have had a fever for 2 days or longer, and you are not getting better. You have any of the following problems for more than 6 hours: You cannot eat or drink. You have nausea and vomiting. You have diarrhea. Get help right away if: You have severe hypoglycemia. This means your blood glucose is lower than 54 mg/dL (3.0 mmol/L). You become confused or you have trouble thinking clearly. You have difficulty breathing. You have moderate or large ketone levels in your urine. These symptoms may represent a serious problem that is an emergency. Do not wait to see if the symptoms will go away. Get medical help right away. Call your local emergency services (911 in the [place] Do not drive yourself to the hospital. Summary Type 2 diabetes mellitus is a long-term, or chronic, disease. In type 2 diabetes, the pancreas does not make enough of a hormone called insulin, or cells in the body do not respond properly to insulin that the body makes. This condition is treated by making dietary and lifestyle changes and taking diabetes medicines or  insulin. Your health care provider will set treatment goals for you. Your goals will be based on your age, other medical conditions you have, and how you respond to diabetes treatment. Keep all follow-up visits. This is important. This information is not intended to replace advice given to you by your health care provider. Make sure you discuss any questions you have with your health care provider. Document Revised: 07/22/2020 Document Reviewed: 07/22/2020 Elsevier Patient Education  2024 Elsevier Inc.  Diabetes Mellitus and Nutrition, Adult When you have diabetes, or diabetes mellitus, it is very important to have healthy eating habits because your blood sugar (glucose) levels are greatly affected by what you eat and drink.  Eating healthy foods in the right amounts, at about the same times every day, can help you: Manage your blood glucose. Lower your risk of heart disease. Improve your blood pressure. Reach or maintain a healthy weight. What can affect my meal plan? Every person with diabetes is different, and each person has different needs for a meal plan. Your health care provider may recommend that you work with a dietitian to make a meal plan that is best for you. Your meal plan may vary depending on factors such as: The calories you need. The medicines you take. Your weight. Your blood glucose, blood pressure, and cholesterol levels. Your activity level. Other health conditions you have, such as heart or kidney disease. How do carbohydrates affect me? Carbohydrates, also called carbs, affect your blood glucose level more than any other type of food. Eating carbs raises the amount of glucose in your blood. It is important to know how many carbs you can safely have in each meal. This is different for every person. Your dietitian can help you calculate how many carbs you should have at each meal and for each snack. How does alcohol affect me? Alcohol can cause a decrease in blood glucose (hypoglycemia), especially if you use insulin or take certain diabetes medicines by mouth. Hypoglycemia can be a life-threatening condition. Symptoms of hypoglycemia, such as sleepiness, dizziness, and confusion, are similar to symptoms of having too much alcohol. Do not drink alcohol if: Your health care provider tells you not to drink. You are pregnant, may be pregnant, or are planning to become pregnant. If you drink alcohol: Limit how much you have to: 0-1 drink a day for women. 0-2 drinks a day for men. Know how much alcohol is in your drink. In the [place] one drink equals one 12 oz bottle of beer (355 mL), one 5 oz glass of wine (148 mL), or one 1 oz glass of hard liquor (44 mL). Keep yourself hydrated with water,  diet soda, or unsweetened iced tea. Keep in mind that regular soda, juice, and other mixers may contain a lot of sugar and must be counted as carbs. What are tips for following this plan?  Reading food labels Start by checking the serving size on the Nutrition Facts label of packaged foods and drinks. The number of calories and the amount of carbs, fats, and other nutrients listed on the label are based on one serving of the item. Many items contain more than one serving per package. Check the total grams (g) of carbs in one serving. Check the number of grams of saturated fats and trans fats in one serving. Choose foods that have a low amount or none of these fats. Check the number of milligrams (mg) of salt (sodium) in one serving. Most people should limit total sodium intake  to less than 2,300 mg per day. Always check the nutrition information of foods labeled as "low-fat" or "nonfat." These foods may be higher in added sugar or refined carbs and should be avoided. Talk to your dietitian to identify your daily goals for nutrients listed on the label. Shopping Avoid buying canned, pre-made, or processed foods. These foods tend to be high in fat, sodium, and added sugar. Shop around the outside edge of the grocery store. This is where you will most often find fresh fruits and vegetables, bulk grains, fresh meats, and fresh dairy products. Cooking Use low-heat cooking methods, such as baking, instead of high-heat cooking methods, such as deep frying. Cook using healthy oils, such as olive, canola, or sunflower oil. Avoid cooking with butter, cream, or high-fat meats. Meal planning Eat meals and snacks regularly, preferably at the same times every day. Avoid going long periods of time without eating. Eat foods that are high in fiber, such as fresh fruits, vegetables, beans, and whole grains. Eat 4-6 oz (112-168 g) of lean protein each day, such as lean meat, chicken, fish, eggs, or tofu. One ounce  (oz) (28 g) of lean protein is equal to: 1 oz (28 g) of meat, chicken, or fish. 1 egg.  cup (62 g) of tofu. Eat some foods each day that contain healthy fats, such as avocado, nuts, seeds, and fish. What foods should I eat? Fruits Berries. Apples. Oranges. Peaches. Apricots. Plums. Grapes. Mangoes. Papayas. Pomegranates. Kiwi. Cherries. Vegetables Leafy greens, including lettuce, spinach, kale, chard, collard greens, mustard greens, and cabbage. Beets. Cauliflower. Broccoli. Carrots. Green beans. Tomatoes. Peppers. Onions. Cucumbers. Brussels sprouts. Grains Whole grains, such as whole-wheat or whole-grain bread, crackers, tortillas, cereal, and pasta. Unsweetened oatmeal. Quinoa. Brown or wild rice. Meats and other proteins Seafood. Poultry without skin. Lean cuts of poultry and beef. Tofu. Nuts. Seeds. Dairy Low-fat or fat-free dairy products such as milk, yogurt, and cheese. The items listed above may not be a complete list of foods and beverages you can eat and drink. Contact a dietitian for more information. What foods should I avoid? Fruits Fruits canned with syrup. Vegetables Canned vegetables. Frozen vegetables with butter or cream sauce. Grains Refined white flour and flour products such as bread, pasta, snack foods, and cereals. Avoid all processed foods. Meats and other proteins Fatty cuts of meat. Poultry with skin. Breaded or fried meats. Processed meat. Avoid saturated fats. Dairy Full-fat yogurt, cheese, or milk. Beverages Sweetened drinks, such as soda or iced tea. The items listed above may not be a complete list of foods and beverages you should avoid. Contact a dietitian for more information. Questions to ask a health care provider Do I need to meet with a certified diabetes care and education specialist? Do I need to meet with a dietitian? What number can I call if I have questions? When are the best times to check my blood glucose? Where to find more  information: American Diabetes Association: diabetes.org Academy of Nutrition and Dietetics: eatright.[employer] of Diabetes and Digestive and Kidney Diseases: StageSync.si Association of Diabetes Care & Education Specialists: diabeteseducator.org Summary It is important to have healthy eating habits because your blood sugar (glucose) levels are greatly affected by what you eat and drink. It is important to use alcohol carefully. A healthy meal plan will help you manage your blood glucose and lower your risk of heart disease. Your health care provider may recommend that you work with a dietitian to make a meal plan that  is best for you. This information is not intended to replace advice given to you by your health care provider. Make sure you discuss any questions you have with your health care provider. Document Revised: 11/29/2019 Document Reviewed: 11/29/2019 Elsevier Patient Education  2024 Elsevier Inc.      Signed,   Meredith Staggers, MD [facility], [facility] 03/04/23 1:53 PM

## 2023-03-16 DIAGNOSIS — H401133 Primary open-angle glaucoma, bilateral, severe stage: Secondary | ICD-10-CM | POA: Diagnosis not present

## 2023-03-29 DIAGNOSIS — N3946 Mixed incontinence: Secondary | ICD-10-CM | POA: Diagnosis not present

## 2023-03-29 DIAGNOSIS — M62838 Other muscle spasm: Secondary | ICD-10-CM | POA: Diagnosis not present

## 2023-03-29 DIAGNOSIS — R350 Frequency of micturition: Secondary | ICD-10-CM | POA: Diagnosis not present

## 2023-03-29 DIAGNOSIS — M6289 Other specified disorders of muscle: Secondary | ICD-10-CM | POA: Diagnosis not present

## 2023-03-29 DIAGNOSIS — N3944 Nocturnal enuresis: Secondary | ICD-10-CM | POA: Diagnosis not present

## 2023-03-29 DIAGNOSIS — M6281 Muscle weakness (generalized): Secondary | ICD-10-CM | POA: Diagnosis not present

## 2023-04-01 DIAGNOSIS — H401133 Primary open-angle glaucoma, bilateral, severe stage: Secondary | ICD-10-CM | POA: Diagnosis not present

## 2023-04-01 DIAGNOSIS — H169 Unspecified keratitis: Secondary | ICD-10-CM | POA: Diagnosis not present

## 2023-04-12 DIAGNOSIS — D045 Carcinoma in situ of skin of trunk: Secondary | ICD-10-CM | POA: Diagnosis not present

## 2023-04-12 DIAGNOSIS — B079 Viral wart, unspecified: Secondary | ICD-10-CM | POA: Diagnosis not present

## 2023-04-12 DIAGNOSIS — D485 Neoplasm of uncertain behavior of skin: Secondary | ICD-10-CM | POA: Diagnosis not present

## 2023-04-12 DIAGNOSIS — L820 Inflamed seborrheic keratosis: Secondary | ICD-10-CM | POA: Diagnosis not present

## 2023-04-13 DIAGNOSIS — H401133 Primary open-angle glaucoma, bilateral, severe stage: Secondary | ICD-10-CM | POA: Diagnosis not present

## 2023-04-15 ENCOUNTER — Encounter: Payer: Self-pay | Admitting: Family Medicine

## 2023-04-15 ENCOUNTER — Ambulatory Visit: Payer: Medicare HMO | Admitting: Family Medicine

## 2023-04-15 VITALS — BP 130/76 | HR 69 | Temp 98.4°F | Ht 60.0 in | Wt 139.4 lb

## 2023-04-15 DIAGNOSIS — R7989 Other specified abnormal findings of blood chemistry: Secondary | ICD-10-CM | POA: Diagnosis not present

## 2023-04-15 DIAGNOSIS — E785 Hyperlipidemia, unspecified: Secondary | ICD-10-CM

## 2023-04-15 DIAGNOSIS — E1165 Type 2 diabetes mellitus with hyperglycemia: Secondary | ICD-10-CM

## 2023-04-15 NOTE — Progress Notes (Signed)
Subjective:  Patient ID: Maria Sweeney, female    DOB: [birth year]  Age: 72 y.o. MRN: [id number]  CC:  Chief Complaint  Patient presents with   Diabetes    Pt is doing okay, notes no questions has been working on diet and has lost some weight, brought some BG results to show today as well, 94, 101, 151, 124    HPI CLAUDETT OSTERLUND presents for   Diabetes: With hyperglycemia, A1c had increased from May 2023 to September 2024 at 8.1.  Glucose was  149 at that time in the office.  She had been eating more sugary foods prior to that visit, and had improved her diet, increased exercise and had lost a few pounds on follow-up visit October 24.  Based on that significant change we decided to hold on new medications with recheck in 6 weeks. Weight has improved by 4 pounds since her October 24 visit, and 7 pounds since September 30. home blood sugars as above, 94, 101, 151, 124.  No 200's - highest 160. Still working on diet and exercise. Avoiding sugars. Still walking for exercise, active with antique store.   Wt Readings from Last 3 Encounters:  04/15/23 139 lb 6.4 oz (63.2 kg)  03/04/23 143 lb (64.9 kg)  02/08/23 146 lb (66.2 kg)    Lab Results  Component Value Date   HGBA1C 8.1 (H) 02/08/2023   HGBA1C 6.7 (H) 09/12/2021   HGBA1C 6.4 10/18/2020   Lab Results  Component Value Date   LDLCALC 68 02/08/2023   CREATININE 0.77 02/08/2023   Elevated LFT's Noted in September. Weight loss as above. No alcohol or regular tylenol. No n/v/abd pain, jaundice or scleral icterus.  Feels well - better with weight loss, Less GERD.   History Patient Active Problem List   Diagnosis Date Noted   Encounter for screening colonoscopy    Vitamin D deficiency 08/02/2018   Prediabetes 08/02/2018   Carpal tunnel syndrome 02/10/2018   Tinnitus 06/25/2016   Numbness 04/13/2016   Loss of smell 04/13/2016   Pure hypercholesterolemia 11/30/2014   Degenerative disc disease, cervical 04/24/2014   Essential  hypertension, benign 10/19/2013   Insomnia 10/19/2013   Herpes labialis 10/19/2013   Anxiety and depression 07/25/2012   GERD (gastroesophageal reflux disease) 07/25/2012   Past Medical History:  Diagnosis Date   Anxiety    Arthritis    Phreesia 04/12/2020   Asthma    Cataract    [employer]reesia 10/25/2019   Cervical spondylosis    Complication of anesthesia    with colonoscopy woke up twice   Depression    Depression    [employer]reesia 04/12/2020   GERD (gastroesophageal reflux disease)    Glaucoma    [employer]reesia 10/25/2019   Hyperlipidemia    Hypertension    IBS (irritable bowel syndrome)    Constipation with diarrhea.   Oral herpes    Past Surgical History:  Procedure Laterality Date   ABDOMINAL HYSTERECTOMY  1978   DUB; ovaries intact.   APPENDECTOMY  1988   BREAST BIOPSY Bilateral 2009   Calcium Deposit- 1980's and also in 2009/ Right side 1995   BREAST EXCISIONAL BIOPSY Bilateral    BREAST SURGERY N/A    [employer]reesia 10/25/2019   CATARACT EXTRACTION W/PHACO Left 10/23/2020   Procedure: CATARACT EXTRACTION PHACO AND INTRAOCULAR LENS PLACEMENT (IOC) LEFT TORIC LENS KAHOOK DUAL BLADE GONIOTOMY,  VISION BLUE 7.69 00:53.0;  Surgeon: Lockie Mola, MD;  Location: [facility];  Service: Ophthalmology;  Laterality: Left;  CATARACT EXTRACTION W/PHACO Right 11/20/2020   Procedure: CATARACT EXTRACTION PHACO AND INTRAOCULAR LENS PLACEMENT (IOC) RIGHT LENS KAHOOK DUAL BLADE GONIOTOMY VISION BLUE 8.58 01:24.9;  Surgeon: Lockie Mola, MD;  Location: [facility];  Service: Ophthalmology;  Laterality: Right;  latex   COLONOSCOPY  05/11/2009   Normal.  Previous colonoscopy with polyps. [facility].     COLONOSCOPY WITH PROPOFOL N/A 12/20/2019   Procedure: COLONOSCOPY WITH PROPOFOL;  Surgeon: Toney Reil, MD;  Location: [facility];  Service: Gastroenterology;  Laterality: N/A;   DEBRIDEMENT TENNIS ELBOW     ROTATOR CUFF REPAIR     TONSILLECTOMY     UPPER  GASTROINTESTINAL ENDOSCOPY     Allergies  Allergen Reactions   Amoxicillin Anaphylaxis   Augmentin [Amoxicillin-Pot Clavulanate]     Numbness around mouth, throat tightness   Clavulanic Acid    Codeine Itching   Depakote [Divalproex Sodium]     hives   Latex Hives   Lipitor [Atorvastatin]     Muscles pain   Penicillins     Throat tightness, mouth numb   Sulfamethoxazole    Trimethoprim    Zocor [Simvastatin]     Muscle pain   Septra [Sulfamethoxazole-Trimethoprim] Rash   Prior to Admission medications   Medication Sig Start Date End Date Taking? Authorizing Provider  albuterol (PROVENTIL HFA;VENTOLIN HFA) 108 (90 Base) MCG/ACT inhaler Inhale 2 puffs into the lungs every 4 (four) hours as needed for wheezing or shortness of breath (cough, shortness of breath or wheezing.). 02/11/16  Yes Ethelda Chick, MD  dorzolamide-timolol (COSOPT) 22.3-6.8 MG/ML ophthalmic solution  12/25/19  Yes [provider]  famotidine (PEPCID) 40 MG tablet Take 1 tablet (40 mg total) by mouth daily. 02/08/23  Yes Shade Flood, MD  FLUoxetine (PROZAC) 20 MG capsule Take 2 capsules (40 mg total) by mouth daily. 02/08/23  Yes Shade Flood, MD  fluticasone (FLONASE) 50 MCG/ACT nasal spray Place 2 sprays into both nostrils daily. 09/25/22  Yes Shade Flood, MD  Fluticasone Furoate (ARNUITY ELLIPTA) 100 MCG/ACT AEPB Inhale 100 mcg into the lungs daily as needed. 10/09/22  Yes Shade Flood, MD  latanoprost (XALATAN) 0.005 % ophthalmic solution 1 drop at bedtime.   Yes [provider]  Latanoprostene Bunod 0.024 % SOLN Place 1 drop into the left eye daily.   Yes [provider]  methylcellulose (CITRUCEL) oral powder Take 1 packet by mouth daily. 03/20/22  Yes Armbruster, Willaim Rayas, MD  Omega-3 Fatty Acids (FISH OIL OMEGA-3) 1000 MG CAPS Take by mouth.   Yes [provider]  pantoprazole (PROTONIX) 40 MG tablet TAKE ONE TABLET BY MOUTH once to TWICE (2) DAILY BEFORE  A MEAL 10/08/22  Yes Armbruster, Willaim Rayas, MD  rosuvastatin (CRESTOR) 10 MG tablet TAKE ONE TABLET BY MOUTH DAILY 08/10/22  Yes Shade Flood, MD  spironolactone (ALDACTONE) 50 MG tablet Take 1 tablet (50 mg total) by mouth daily. 02/08/23  Yes Shade Flood, MD  VITAMIN D PO Take by mouth.   Yes [provider]   Social History   Socioeconomic History   Marital status: Divorced    Spouse name: Not on file   Number of children: 2   Years of education: Bachelors   Highest education level: Bachelor's degree (e.g., BA, AB, BS)  Occupational History   Occupation: Training and development officer: [employer]    Comment: also works at [employer].   Occupation: retired  Tobacco Use   Smoking status:  Never   Smokeless tobacco: Never  Vaping Use   Vaping status: Never Used  Substance and Sexual Activity   Alcohol use: No   Drug use: Never   Sexual activity: Yes    Partners: Male    Birth control/protection: Surgical, Post-menopausal  Other Topics Concern   Not on file  Social History Narrative   Marital status: divorced since 1999; dating seriously in 2019 since 2015.      Children: one son, one daughter; 2 grandchildren.      Lives: alone in house with 2 dogs     Employment:  Retired.  Needs to work in 2019.      Tobacco: none      Alcohol: rare      Drugs: none      Exercise:  Sporadic   Right-handed.   Occasional caffeine use.      ADLs: independent with ADLs; drives; no assistant devices.      Advanced Directives: none; FULL CODE.   Social Determinants of Health   Financial Resource Strain: Low Risk  (03/04/2023)   Overall Financial Resource Strain (CARDIA)    Difficulty of Paying Living Expenses: Not hard at all  Food Insecurity: No Food Insecurity (03/04/2023)   Hunger Vital Sign    Worried About Running Out of Food in the Last Year: Never true    Ran Out of Food in the Last Year: Never true  Transportation Needs: No Transportation Needs (03/04/2023)   PRAPARE -  Administrator, Civil Service (Medical): No    Lack of Transportation (Non-Medical): No  Physical Activity: Insufficiently Active (03/04/2023)   Exercise Vital Sign    Days of Exercise per Week: 3 days    Minutes of Exercise per Session: 40 min  Stress: No Stress Concern Present (03/04/2023)   [employer] of Occupational Health - Occupational Stress Questionnaire    Feeling of Stress : Not at all  Social Connections: Moderately Integrated (03/04/2023)   Social Connection and Isolation Panel [NHANES]    Frequency of Communication with Friends and Family: More than three times a week    Frequency of Social Gatherings with Friends and Family: Three times a week    Attends Religious Services: More than 4 times per year    Active Member of Clubs or Organizations: No    Attends Banker Meetings: Never    Marital Status: Living with partner  Intimate Partner Violence: Not At Risk (08/13/2022)   Humiliation, Afraid, Rape, and Kick questionnaire    Fear of Current or Ex-Partner: No    Emotionally Abused: No    Physically Abused: No    Sexually Abused: No    Review of Systems   Objective:   Vitals:   04/15/23 1343  BP: 130/76  Pulse: 69  Temp: 98.4 F (36.9 C)  TempSrc: Temporal  SpO2: 97%  Weight: 139 lb 6.4 oz (63.2 kg)  Height: 5' (1.524 m)     Physical Exam Vitals reviewed.  Constitutional:      Appearance: Normal appearance. She is well-developed.  HENT:     Head: Normocephalic and atraumatic.  Eyes:     General: No scleral icterus.    Conjunctiva/sclera: Conjunctivae normal.     Pupils: Pupils are equal, round, and reactive to light.  Neck:     Vascular: No carotid bruit.  Cardiovascular:     Rate and Rhythm: Normal rate and regular rhythm.     Heart sounds: Normal heart sounds.  Pulmonary:     Effort: Pulmonary effort is normal.     Breath sounds: Normal breath sounds.  Abdominal:     General: There is no distension.      Palpations: Abdomen is soft. There is no mass or pulsatile mass.     Tenderness: There is no abdominal tenderness.  Musculoskeletal:     Right lower leg: No edema.     Left lower leg: No edema.  Skin:    General: Skin is warm and dry.     Coloration: Skin is not jaundiced.  Neurological:     Mental Status: She is alert and oriented to person, place, and time.  Psychiatric:        Mood and Affect: Mood normal.        Behavior: Behavior normal.      Assessment & Plan:  KORTNEY CAVELL is a 72 y.o. female . Elevated LFTs - Plan: Comprehensive metabolic panel  Type 2 diabetes mellitus with hyperglycemia, without long-term current use of insulin (HCC) - Plan: Hemoglobin A1c, Microalbumin / creatinine urine ratio  Hyperlipidemia, unspecified hyperlipidemia type - Plan: Lipid panel  Commended on weight loss and anticipate significant improvement in diabetes with diet/exercise treatment alone.  Based on home readings we will hold on new meds at this time.  Lab only visit in 1 month for recheck A1c, glucose on CMP as well as repeat LFTs and lipids.  Anticipate this will look much better.  No med changes for now with her Crestor, or other meds.  If jaundice, scleral icterus, abdominal pain, nausea, vomiting or new symptoms be seen right away but I do not expect that to occur.  Possible hepatic steatosis component of prior LFT elevation.  79-month follow-up if labs stable.  No orders of the defined types were placed in this encounter.  Patient Instructions  Congratulations on the further weight loss, I suspect your blood sugar and liver tests will look much better on repeat labs in 1 month.  Keep up the good work.  No new medications for now.  If any new symptoms let me know but otherwise I will see you in 4 months    Signed,   Meredith Staggers, MD [facility], [facility] 04/15/23 2:09 PM

## 2023-04-15 NOTE — Patient Instructions (Signed)
Congratulations on the further weight loss, I suspect your blood sugar and liver tests will look much better on repeat labs in 1 month.  Keep up the good work.  No new medications for now.  If any new symptoms let me know but otherwise I will see you in 4 months

## 2023-04-27 DIAGNOSIS — D045 Carcinoma in situ of skin of trunk: Secondary | ICD-10-CM | POA: Diagnosis not present

## 2023-05-17 ENCOUNTER — Other Ambulatory Visit (INDEPENDENT_AMBULATORY_CARE_PROVIDER_SITE_OTHER): Payer: Medicare HMO

## 2023-05-17 DIAGNOSIS — E1165 Type 2 diabetes mellitus with hyperglycemia: Secondary | ICD-10-CM | POA: Diagnosis not present

## 2023-05-17 DIAGNOSIS — E785 Hyperlipidemia, unspecified: Secondary | ICD-10-CM

## 2023-05-17 DIAGNOSIS — R7989 Other specified abnormal findings of blood chemistry: Secondary | ICD-10-CM | POA: Diagnosis not present

## 2023-05-17 LAB — COMPREHENSIVE METABOLIC PANEL
ALT: 31 U/L (ref 0–35)
AST: 29 U/L (ref 0–37)
Albumin: 4.8 g/dL (ref 3.5–5.2)
Alkaline Phosphatase: 103 U/L (ref 39–117)
BUN: 15 mg/dL (ref 6–23)
CO2: 27 meq/L (ref 19–32)
Calcium: 10.3 mg/dL (ref 8.4–10.5)
Chloride: 104 meq/L (ref 96–112)
Creatinine, Ser: 0.9 mg/dL (ref 0.40–1.20)
GFR: 63.54 mL/min (ref >60.00)
Glucose, Bld: 130 mg/dL — ABNORMAL HIGH (ref 70–99)
Potassium: 4.7 meq/L (ref 3.5–5.1)
Sodium: 140 meq/L (ref 135–145)
Total Bilirubin: 0.9 mg/dL (ref 0.2–1.2)
Total Protein: 7.4 g/dL (ref 6.0–8.3)

## 2023-05-17 LAB — LIPID PANEL
Cholesterol: 185 mg/dL (ref 0–200)
HDL: 44.5 mg/dL (ref >39.00)
LDL Cholesterol: 101 mg/dL — ABNORMAL HIGH (ref 0–99)
NonHDL: 140.96
Total CHOL/HDL Ratio: 4
Triglycerides: 200 mg/dL — ABNORMAL HIGH (ref 0.0–149.0)
VLDL: 40 mg/dL (ref 0.0–40.0)

## 2023-05-17 LAB — MICROALBUMIN / CREATININE URINE RATIO
Creatinine,U: 125.5 mg/dL
Microalb Creat Ratio: 0.6 mg/g (ref 0.0–30.0)
Microalb, Ur: <0.7 mg/dL (ref 0.0–1.9)

## 2023-05-17 LAB — HEMOGLOBIN A1C: Hgb A1c MFr Bld: 6.7 % — ABNORMAL HIGH (ref 4.6–6.5)

## 2023-06-01 DIAGNOSIS — D2271 Melanocytic nevi of right lower limb, including hip: Secondary | ICD-10-CM | POA: Diagnosis not present

## 2023-06-01 DIAGNOSIS — Z85828 Personal history of other malignant neoplasm of skin: Secondary | ICD-10-CM | POA: Diagnosis not present

## 2023-06-01 DIAGNOSIS — D2261 Melanocytic nevi of right upper limb, including shoulder: Secondary | ICD-10-CM | POA: Diagnosis not present

## 2023-06-01 DIAGNOSIS — L820 Inflamed seborrheic keratosis: Secondary | ICD-10-CM | POA: Diagnosis not present

## 2023-06-01 DIAGNOSIS — L2989 Other pruritus: Secondary | ICD-10-CM | POA: Diagnosis not present

## 2023-06-01 DIAGNOSIS — D045 Carcinoma in situ of skin of trunk: Secondary | ICD-10-CM | POA: Diagnosis not present

## 2023-06-01 DIAGNOSIS — L821 Other seborrheic keratosis: Secondary | ICD-10-CM | POA: Diagnosis not present

## 2023-06-01 DIAGNOSIS — D485 Neoplasm of uncertain behavior of skin: Secondary | ICD-10-CM | POA: Diagnosis not present

## 2023-06-01 DIAGNOSIS — D2262 Melanocytic nevi of left upper limb, including shoulder: Secondary | ICD-10-CM | POA: Diagnosis not present

## 2023-06-01 DIAGNOSIS — L538 Other specified erythematous conditions: Secondary | ICD-10-CM | POA: Diagnosis not present

## 2023-06-01 DIAGNOSIS — L905 Scar conditions and fibrosis of skin: Secondary | ICD-10-CM | POA: Diagnosis not present

## 2023-06-01 DIAGNOSIS — D2272 Melanocytic nevi of left lower limb, including hip: Secondary | ICD-10-CM | POA: Diagnosis not present

## 2023-06-01 DIAGNOSIS — D225 Melanocytic nevi of trunk: Secondary | ICD-10-CM | POA: Diagnosis not present

## 2023-06-02 ENCOUNTER — Other Ambulatory Visit: Payer: Self-pay | Admitting: Family Medicine

## 2023-06-02 DIAGNOSIS — B001 Herpesviral vesicular dermatitis: Secondary | ICD-10-CM

## 2023-06-02 MED ORDER — VALACYCLOVIR HCL 1 G PO TABS
2000.0000 mg | ORAL_TABLET | Freq: Two times a day (BID) | ORAL | 1 refills | Status: AC
Start: 2023-06-02 — End: 2023-06-03

## 2023-06-02 NOTE — Telephone Encounter (Signed)
Requested Prescriptions   Pending Prescriptions Disp Refills   valACYclovir (VALTREX) 1000 MG tablet 16 tablet 1    Sig: Take 2 tablets (2,000 mg total) by mouth 2 (two) times daily for 16 doses.     Date of patient request: 06/02/2023 Last office visit: 04/15/2023 Upcoming visit: 08/13/2023

## 2023-06-02 NOTE — Telephone Encounter (Signed)
Copied from CRM [id number]. Topic: Clinical - Medication Refill >> Jun 02, 2023  2:06 PM Ernst Spell wrote: Most Recent Primary Care Visit:  Provider: [facility]  Department: [facility]  Visit Type: LAB  Date: 05/17/2023  Medication: valACYclovir (VALTREX) 1000 MG tablet  Has the patient contacted their pharmacy? No Pt stated that pcp is aware that when she has blisters she needs this medicine.  Is this the correct pharmacy for this prescription? yes [facility] - [place], [place] - [address] [place] [address] Phone: [phone] Fax: [phone]   Has the prescription been filled recently? No  Is the patient out of the medication? Yes  Has the patient been seen for an appointment in the last year OR does the patient have an upcoming appointment? No  Can we respond through MyChart? Yes  Agent: Please be advised that Rx refills may take up to 3 business days. We ask that you follow-up with your pharmacy.

## 2023-06-02 NOTE — Addendum Note (Signed)
Addended by: Eldred Manges on: 06/02/2023 03:51 PM   Modules accepted: Orders

## 2023-06-02 NOTE — Addendum Note (Signed)
Addended by: Meredith Staggers R on: 06/02/2023 04:28 PM   Modules accepted: Orders

## 2023-06-14 DIAGNOSIS — D045 Carcinoma in situ of skin of trunk: Secondary | ICD-10-CM | POA: Diagnosis not present

## 2023-07-28 DIAGNOSIS — H40053 Ocular hypertension, bilateral: Secondary | ICD-10-CM | POA: Diagnosis not present

## 2023-08-09 ENCOUNTER — Ambulatory Visit: Payer: Medicare HMO | Admitting: Family Medicine

## 2023-08-12 DIAGNOSIS — H04123 Dry eye syndrome of bilateral lacrimal glands: Secondary | ICD-10-CM | POA: Diagnosis not present

## 2023-08-12 DIAGNOSIS — H401133 Primary open-angle glaucoma, bilateral, severe stage: Secondary | ICD-10-CM | POA: Diagnosis not present

## 2023-08-12 DIAGNOSIS — E119 Type 2 diabetes mellitus without complications: Secondary | ICD-10-CM | POA: Diagnosis not present

## 2023-08-13 ENCOUNTER — Ambulatory Visit: Payer: Medicare HMO | Admitting: Family Medicine

## 2023-08-13 VITALS — BP 124/62 | HR 56 | Temp 97.9°F | Ht 60.0 in | Wt 132.0 lb

## 2023-08-13 DIAGNOSIS — E7800 Pure hypercholesterolemia, unspecified: Secondary | ICD-10-CM | POA: Diagnosis not present

## 2023-08-13 DIAGNOSIS — K219 Gastro-esophageal reflux disease without esophagitis: Secondary | ICD-10-CM

## 2023-08-13 DIAGNOSIS — I10 Essential (primary) hypertension: Secondary | ICD-10-CM | POA: Diagnosis not present

## 2023-08-13 DIAGNOSIS — F32A Depression, unspecified: Secondary | ICD-10-CM

## 2023-08-13 DIAGNOSIS — F5104 Psychophysiologic insomnia: Secondary | ICD-10-CM | POA: Diagnosis not present

## 2023-08-13 DIAGNOSIS — F419 Anxiety disorder, unspecified: Secondary | ICD-10-CM | POA: Diagnosis not present

## 2023-08-13 DIAGNOSIS — R0982 Postnasal drip: Secondary | ICD-10-CM | POA: Diagnosis not present

## 2023-08-13 DIAGNOSIS — E1165 Type 2 diabetes mellitus with hyperglycemia: Secondary | ICD-10-CM | POA: Diagnosis not present

## 2023-08-13 LAB — LIPID PANEL
Cholesterol: 156 mg/dL (ref 0–200)
HDL: 41.5 mg/dL (ref >39.00)
LDL Cholesterol: 83 mg/dL (ref 0–99)
NonHDL: 114.55
Total CHOL/HDL Ratio: 4
Triglycerides: 160 mg/dL — ABNORMAL HIGH (ref 0.0–149.0)
VLDL: 32 mg/dL (ref 0.0–40.0)

## 2023-08-13 LAB — COMPREHENSIVE METABOLIC PANEL WITH GFR
ALT: 26 U/L (ref 0–35)
AST: 25 U/L (ref 0–37)
Albumin: 4.6 g/dL (ref 3.5–5.2)
Alkaline Phosphatase: 81 U/L (ref 39–117)
BUN: 14 mg/dL (ref 6–23)
CO2: 27 meq/L (ref 19–32)
Calcium: 9.9 mg/dL (ref 8.4–10.5)
Chloride: 107 meq/L (ref 96–112)
Creatinine, Ser: 0.89 mg/dL (ref 0.40–1.20)
GFR: 64.28 mL/min (ref >60.00)
Glucose, Bld: 117 mg/dL — ABNORMAL HIGH (ref 70–99)
Potassium: 4.7 meq/L (ref 3.5–5.1)
Sodium: 143 meq/L (ref 135–145)
Total Bilirubin: 0.8 mg/dL (ref 0.2–1.2)
Total Protein: 7 g/dL (ref 6.0–8.3)

## 2023-08-13 LAB — HEMOGLOBIN A1C: Hgb A1c MFr Bld: 6.3 % (ref 4.6–6.5)

## 2023-08-13 MED ORDER — FLUTICASONE PROPIONATE 50 MCG/ACT NA SUSP: 2.0000 | Freq: Every day | NASAL | 6 refills | Status: DC

## 2023-08-13 MED ORDER — FLUOXETINE HCL 20 MG PO CAPS: 40.0000 mg | ORAL_CAPSULE | Freq: Every day | ORAL | 1 refills | Status: DC

## 2023-08-13 MED ORDER — SPIRONOLACTONE 50 MG PO TABS: 50.0000 mg | ORAL_TABLET | Freq: Every day | ORAL | 1 refills | Status: DC

## 2023-08-13 MED ORDER — FAMOTIDINE 40 MG PO TABS: 40.0000 mg | ORAL_TABLET | Freq: Every day | ORAL | 1 refills | Status: DC

## 2023-08-13 MED ORDER — ROSUVASTATIN CALCIUM 10 MG PO TABS: 10.0000 mg | ORAL_TABLET | Freq: Every day | ORAL | 3 refills | Status: AC

## 2023-08-13 NOTE — Progress Notes (Signed)
 Subjective:  Patient ID: Maria Sweeney, female    DOB: 03-Nov-1950  Age: 73 y.o. MRN: [id number]  CC:  Chief Complaint  Patient presents with   Medical Management of Chronic Issues    Pt is well no concerns     HPI Maria Sweeney presents for   Diabetes Prior prediabetes, then A1c had increased up to 8.1 in September, attributed to more sugary foods.  She made significant changes in diet and cut out sugar including sweet drinks and eating better and lost a few pounds.  A1c significantly improved on most recent testing January 6 at 6.7.  Home readings - 134, 124. Varies.  Continued work on diet, exercise. Weight has improved by 7#. Still eating 3 melas per day with protein, fruits, vegetables. Portion control.   Urine microalbumin normal at less than 0.7, normal ratio on 05/17/2023. Lab Results  Component Value Date   HGBA1C 6.7 (H) 05/17/2023   Wt Readings from Last 3 Encounters:  08/13/23 132 lb (59.9 kg)  04/15/23 139 lb 6.4 oz (63.2 kg)  03/04/23 143 lb (64.9 kg)   Elevated LFTs History of possible hepatic steatosis.  CT abdomen pelvis in 2021.  No focal liver abnormality was seen, no gallstones gallbladder wall thickening or biliary dilatation.  Normalized on most recent testing in January.  Had been up to 60, 60 AST/ALT in September.  Weight had improved at follow-up testing.  Hyperlipidemia: Treated with Crestor 10 mg daily, no new myalgia/side effects.  Lab Results  Component Value Date   CHOL 185 05/17/2023   HDL 44.50 05/17/2023   LDLCALC 101 (H) 05/17/2023   LDLDIRECT 99.0 09/12/2021   TRIG 200.0 (H) 05/17/2023   CHOLHDL 4 05/17/2023   Lab Results  Component Value Date   ALT 31 05/17/2023   AST 29 05/17/2023   ALKPHOS 103 05/17/2023   BILITOT 0.9 05/17/2023   Hypertension: Spironolactone 50 mg daily. No new side effects Home readings:none usually.  BP Readings from Last 3 Encounters:  08/13/23 124/62  04/15/23 130/76  03/04/23 126/84   Lab Results   Component Value Date   CREATININE 0.90 05/17/2023   Chronic cough With upper airway irritation component treated with Pepcid, Protonix, and Arnuity Ellipta with albuterol if needed.  Flonase for upper airway symptoms.  Stable Protonix twice daily, Pepcid, Flonase when discussed in September, and only intermittently using Arnuity at that time.rare need for arnuity.   Depression with insomnia. Discussed in September.  Chronic insomnia.  Intolerant to mirtazapine due to side effects.  Minimal change in symptoms with higher dose of Prozac above 40 mg.  Only temporary improvement with Ambien, unsure if melatonin was helpful.  Hydroxyzine with minimal relief and felt groggy the next day along with urinary side effects.  Minimal change in symptoms with addition of trazodone.  Discussed meeting with a therapist.  Sleep is off and on. Working on sleep - declines meeting with a therapist at this time. But would consider.      08/13/2023   10:30 AM 04/15/2023    1:46 PM 02/08/2023   10:38 AM 11/06/2022    9:36 AM 09/25/2022   10:12 AM  Depression screen PHQ 2/9  Decreased Interest 0 0 0 1 1  Down, Depressed, Hopeless 0 0 0 1 1  PHQ - 2 Score 0 0 0 2 2  Altered sleeping 0 0 2 3 3   Tired, decreased energy 0 0 1 1 1   Change in appetite 0 0 0 0  0  Feeling bad or failure about yourself  0 0 0 0 0  Trouble concentrating 0 0 0 0 1  Moving slowly or fidgety/restless 0 0 0 0 0  Suicidal thoughts 0 0 0 0 0  PHQ-9 Score 0 0 3 6 7   Difficult doing work/chores   Not difficult at all  Not difficult at all   HM: Shingrix and tdap recommended at pharmacy - can check on coverage.  Covid booster - declined.   Due for awv.   History Patient Active Problem List   Diagnosis Date Noted   Encounter for screening colonoscopy    Vitamin D deficiency 08/02/2018   Prediabetes 08/02/2018   Carpal tunnel syndrome 02/10/2018   Tinnitus 06/25/2016   Numbness 04/13/2016   Loss of smell 04/13/2016   Pure  hypercholesterolemia 11/30/2014   Degenerative disc disease, cervical 04/24/2014   Essential hypertension, benign 10/19/2013   Insomnia 10/19/2013   Herpes labialis 10/19/2013   Anxiety and depression 07/25/2012   GERD (gastroesophageal reflux disease) 07/25/2012   Past Medical History:  Diagnosis Date   Anxiety    Arthritis    Phreesia 04/12/2020   Asthma    Cataract    Phreesia 10/25/2019   Cervical spondylosis    Complication of anesthesia    with colonoscopy woke up twice   Depression    Depression    Phreesia 04/12/2020   GERD (gastroesophageal reflux disease)    Glaucoma    Phreesia 10/25/2019   Hyperlipidemia    Hypertension    IBS (irritable bowel syndrome)    Constipation with diarrhea.   Oral herpes    Past Surgical History:  Procedure Laterality Date   ABDOMINAL HYSTERECTOMY  1978   DUB; ovaries intact.   APPENDECTOMY  1988   BREAST BIOPSY Bilateral 2009   Calcium Deposit- 1980's and also in 2009/ Right side 1995   BREAST EXCISIONAL BIOPSY Bilateral    BREAST SURGERY N/A    Phreesia 10/25/2019   CATARACT EXTRACTION W/PHACO Left 10/23/2020   Procedure: CATARACT EXTRACTION PHACO AND INTRAOCULAR LENS PLACEMENT (IOC) LEFT TORIC LENS KAHOOK DUAL BLADE GONIOTOMY,  VISION BLUE 7.69 00:53.0;  Surgeon: Lockie Mola, MD;  Location: [facility];  Service: Ophthalmology;  Laterality: Left;   CATARACT EXTRACTION W/PHACO Right 11/20/2020   Procedure: CATARACT EXTRACTION PHACO AND INTRAOCULAR LENS PLACEMENT (IOC) RIGHT LENS KAHOOK DUAL BLADE GONIOTOMY VISION BLUE 8.58 01:24.9;  Surgeon: Lockie Mola, MD;  Location: [facility];  Service: Ophthalmology;  Laterality: Right;  latex   COLONOSCOPY  05/11/2009   Normal.  Previous colonoscopy with polyps. [facility].     COLONOSCOPY WITH PROPOFOL N/A 12/20/2019   Procedure: COLONOSCOPY WITH PROPOFOL;  Surgeon: Toney Reil, MD;  Location: [facility];  Service: Gastroenterology;  Laterality:  N/A;   DEBRIDEMENT TENNIS ELBOW     ROTATOR CUFF REPAIR     TONSILLECTOMY     UPPER GASTROINTESTINAL ENDOSCOPY     Allergies  Allergen Reactions   Amoxicillin Anaphylaxis   Augmentin [Amoxicillin-Pot Clavulanate]     Numbness around mouth, throat tightness   Clavulanic Acid    Codeine Itching   Depakote [Divalproex Sodium]     hives   Latex Hives   Lipitor [Atorvastatin]     Muscles pain   Penicillins     Throat tightness, mouth numb   Sulfamethoxazole    Trimethoprim    Zocor [Simvastatin]     Muscle pain   Septra [Sulfamethoxazole-Trimethoprim] Rash   Prior to Admission medications  Medication Sig Start Date End Date Taking? Authorizing Provider  albuterol (PROVENTIL HFA;VENTOLIN HFA) 108 (90 Base) MCG/ACT inhaler Inhale 2 puffs into the lungs every 4 (four) hours as needed for wheezing or shortness of breath (cough, shortness of breath or wheezing.). 02/11/16  Yes Ethelda Chick, MD  dorzolamide-timolol (COSOPT) 22.3-6.8 MG/ML ophthalmic solution  12/25/19  Yes [provider]  famotidine (PEPCID) 40 MG tablet Take 1 tablet (40 mg total) by mouth daily. 02/08/23  Yes Shade Flood, MD  FLUoxetine (PROZAC) 20 MG capsule Take 2 capsules (40 mg total) by mouth daily. 02/08/23  Yes Shade Flood, MD  fluticasone (FLONASE) 50 MCG/ACT nasal spray Place 2 sprays into both nostrils daily. 09/25/22  Yes Shade Flood, MD  Fluticasone Furoate (ARNUITY ELLIPTA) 100 MCG/ACT AEPB Inhale 100 mcg into the lungs daily as needed. 10/09/22  Yes Shade Flood, MD  latanoprost (XALATAN) 0.005 % ophthalmic solution 1 drop at bedtime.   Yes [provider]  Latanoprostene Bunod 0.024 % SOLN Place 1 drop into the left eye daily.   Yes [provider]  methylcellulose (CITRUCEL) oral powder Take 1 packet by mouth daily. 03/20/22  Yes Armbruster, Willaim Rayas, MD  Omega-3 Fatty Acids (FISH OIL OMEGA-3) 1000 MG CAPS Take by mouth.   Yes [provider]   pantoprazole (PROTONIX) 40 MG tablet TAKE ONE TABLET BY MOUTH once to TWICE (2) DAILY BEFORE A MEAL 10/08/22  Yes Armbruster, Willaim Rayas, MD  rosuvastatin (CRESTOR) 10 MG tablet TAKE ONE TABLET BY MOUTH DAILY 08/10/22  Yes Shade Flood, MD  spironolactone (ALDACTONE) 50 MG tablet Take 1 tablet (50 mg total) by mouth daily. 02/08/23  Yes Shade Flood, MD  VITAMIN D PO Take by mouth.   Yes [provider]   Social History   Socioeconomic History   Marital status: Divorced    Spouse name: Not on file   Number of children: 2   Years of education: Bachelors   Highest education level: Bachelor's degree (e.g., BA, AB, BS)  Occupational History   Occupation: Training and development officer: [employer]    Comment: also works at [employer].   Occupation: retired  Tobacco Use   Smoking status: Never   Smokeless tobacco: Never  Vaping Use   Vaping status: Never Used  Substance and Sexual Activity   Alcohol use: No   Drug use: Never   Sexual activity: Yes    Partners: Male    Birth control/protection: Surgical, Post-menopausal  Other Topics Concern   Not on file  Social History Narrative   Marital status: divorced since 1999; dating seriously in 2019 since 2015.      Children: one son, one daughter; 2 grandchildren.      Lives: alone in house with 2 dogs     Employment:  Retired.  Needs to work in 2019.      Tobacco: none      Alcohol: rare      Drugs: none      Exercise:  Sporadic   Right-handed.   Occasional caffeine use.      ADLs: independent with ADLs; drives; no assistant devices.      Advanced Directives: none; FULL CODE.   Social Drivers of Corporate investment banker Strain: Low Risk  (08/13/2023)   Overall Financial Resource Strain (CARDIA)    Difficulty of Paying Living Expenses: Not hard at all  Food Insecurity: No Food Insecurity (08/13/2023)   Hunger Vital  Sign    Worried About Programme researcher, broadcasting/film/video in the Last Year: Never true    Ran Out of Food in the Last  Year: Never true  Transportation Needs: No Transportation Needs (08/13/2023)   PRAPARE - Administrator, Civil Service (Medical): No    Lack of Transportation (Non-Medical): No  Physical Activity: Insufficiently Active (08/13/2023)   Exercise Vital Sign    Days of Exercise per Week: 3 days    Minutes of Exercise per Session: 30 min  Stress: No Stress Concern Present (08/13/2023)   [employer] of Occupational Health - Occupational Stress Questionnaire    Feeling of Stress : Not at all  Social Connections: Moderately Integrated (08/13/2023)   Social Connection and Isolation Panel [NHANES]    Frequency of Communication with Friends and Family: More than three times a week    Frequency of Social Gatherings with Friends and Family: More than three times a week    Attends Religious Services: More than 4 times per year    Active Member of [employer] or Organizations: No    Attends Banker Meetings: Never    Marital Status: Living with partner  Intimate Partner Violence: Not At Risk (08/13/2022)   Humiliation, Afraid, Rape, and Kick questionnaire    Fear of Current or Ex-Partner: No    Emotionally Abused: No    Physically Abused: No    Sexually Abused: No    Review of Systems  Constitutional:  Negative for fatigue and unexpected weight change.  Respiratory:  Negative for chest tightness and shortness of breath.   Cardiovascular:  Negative for chest pain, palpitations and leg swelling.  Gastrointestinal:  Negative for abdominal pain and blood in stool.  Neurological:  Negative for dizziness, syncope, light-headedness and headaches.     Objective:   Vitals:   08/13/23 1031  BP: 124/62  Pulse: (!) 56  Temp: 97.9 F (36.6 C)  TempSrc: Temporal  SpO2: 96%  Weight: 132 lb (59.9 kg)  Height: 5' (1.524 m)     Physical Exam Vitals reviewed.  Constitutional:      Appearance: Normal appearance. She is well-developed.  HENT:     Head: Normocephalic and atraumatic.   Eyes:     Conjunctiva/sclera: Conjunctivae normal.     Pupils: Pupils are equal, round, and reactive to light.  Neck:     Vascular: No carotid bruit.  Cardiovascular:     Rate and Rhythm: Normal rate and regular rhythm.     Heart sounds: Normal heart sounds.  Pulmonary:     Effort: Pulmonary effort is normal.     Breath sounds: Normal breath sounds.  Abdominal:     Palpations: Abdomen is soft. There is no pulsatile mass.     Tenderness: There is no abdominal tenderness.  Musculoskeletal:     Right lower leg: No edema.     Left lower leg: No edema.  Skin:    General: Skin is warm and dry.  Neurological:     Mental Status: She is alert and oriented to person, place, and time.  Psychiatric:        Mood and Affect: Mood normal.        Behavior: Behavior normal.        Assessment & Plan:  Maria Sweeney is a 73 y.o. female . Type 2 diabetes mellitus with hyperglycemia, without long-term current use of insulin (HCC) - Plan: Comprehensive metabolic panel with GFR, Hemoglobin A1c  - Check A1c.  Adjust plan accordingly but commended on her weight loss, continued efforts on diet and positive health changes.  I expect numbers to be improved.    Anxiety and depression - Plan: FLUoxetine (PROZAC) 20 MG capsule  - Overall stable on current dose fluoxetine, continue same.  Option to meet with therapist if persistent sleep difficulties.  Essential hypertension - Plan: spironolactone (ALDACTONE) 50 MG tablet, Comprehensive metabolic panel with GFR  - Stable with current regimen, check labs, no changes for now.  Gastroesophageal reflux disease, unspecified whether esophagitis present - Plan: famotidine (PEPCID) 40 MG tablet Postnasal drip - Plan: fluticasone (FLONASE) 50 MCG/ACT nasal spray  - With chronic cough, stable with current regimen.  No recent need for Arnuity.  No med changes.  Pure hypercholesterolemia - Plan: rosuvastatin (CRESTOR) 10 MG tablet, Hemoglobin A1c, Lipid  panel  - Tolerating Crestor, continue same, check labs and adjust plan accordingly  Chronic insomnia  - As above option to meet with therapist.  Numbers provided.  She is still managing on her own at this time.  RTC precautions given.  Meds ordered this encounter  Medications   FLUoxetine (PROZAC) 20 MG capsule    Sig: Take 2 capsules (40 mg total) by mouth daily.    Dispense:  180 capsule    Refill:  1   spironolactone (ALDACTONE) 50 MG tablet    Sig: Take 1 tablet (50 mg total) by mouth daily.    Dispense:  90 tablet    Refill:  1   famotidine (PEPCID) 40 MG tablet    Sig: Take 1 tablet (40 mg total) by mouth daily.    Dispense:  90 tablet    Refill:  1   fluticasone (FLONASE) 50 MCG/ACT nasal spray    Sig: Place 2 sprays into both nostrils daily.    Dispense:  16 g    Refill:  6   rosuvastatin (CRESTOR) 10 MG tablet    Sig: Take 1 tablet (10 mg total) by mouth daily.    Dispense:  90 tablet    Refill:  3   Patient Instructions  No med changes. I will check labs - congrats on the weight loss!  I think your labs will look better. Keep up the good work. Here are a few names if you would like to meet with a therapist to discuss sleep. Take care!  Here are a few options for counseling:  [facility]:  [phone]  [facility]:  [phone]  [facility] [phone]     Signed,   Meredith Staggers, MD [facility], [facility] 08/13/23 10:40 AM

## 2023-08-13 NOTE — Patient Instructions (Signed)
 No med changes. I will check labs - congrats on the weight loss!  I think your labs will look better. Keep up the good work. Here are a few names if you would like to meet with a therapist to discuss sleep. Take care!  Here are a few options for counseling:  [facility]:  [phone]  [facility]:  [phone]  [facility] [phone]

## 2023-08-15 ENCOUNTER — Encounter: Payer: Self-pay | Admitting: Family Medicine

## 2023-08-17 ENCOUNTER — Encounter: Payer: Self-pay | Admitting: Family Medicine

## 2023-08-31 ENCOUNTER — Ambulatory Visit (INDEPENDENT_AMBULATORY_CARE_PROVIDER_SITE_OTHER): Admitting: *Deleted

## 2023-08-31 DIAGNOSIS — Z0000 Encounter for general adult medical examination without abnormal findings: Secondary | ICD-10-CM

## 2023-08-31 DIAGNOSIS — Z23 Encounter for immunization: Secondary | ICD-10-CM

## 2023-08-31 NOTE — Progress Notes (Signed)
 Subjective:   Maria Sweeney is a 73 y.o. female who presents for Medicare Annual (Subsequent) preventive examination.  Visit Complete: Virtual I connected with  Brien Can on 08/31/23 by a audio enabled telemedicine application and verified that I am speaking with the correct person using two identifiers.  Patient Location: Home  Provider Location: Home Office  I discussed the limitations of evaluation and management by telemedicine. The patient expressed understanding and agreed to proceed.  Vital Signs: Because this visit was a virtual/telehealth visit, some criteria may be missing or patient reported. Any vitals not documented were not able to be obtained and vitals that have been documented are patient reported.  Cardiac Risk Factors include: advanced age (>29men, >34 women);hypertension     Objective:    There were no vitals filed for this visit. There is no height or weight on file to calculate BMI.     08/13/2022    1:38 PM 09/05/2021   11:09 AM 11/20/2020   11:37 AM 10/23/2020   10:55 AM 03/07/2020    4:19 PM 12/20/2019    9:21 AM 10/26/2019    9:13 AM  Advanced Directives  Does Patient Have a Medical Advance Directive? No No No No No No No  Does patient want to make changes to medical advance directive?       Yes (ED - Information included in AVS)  Would patient like information on creating a medical advance directive? No - Patient declined Yes (ED - Information included in AVS) No - Patient declined No - Patient declined   No - Patient declined    Current Medications (verified) Outpatient Encounter Medications as of 08/31/2023  Medication Sig   albuterol  (PROVENTIL  HFA;VENTOLIN  HFA) 108 (90 Base) MCG/ACT inhaler Inhale 2 puffs into the lungs every 4 (four) hours as needed for wheezing or shortness of breath (cough, shortness of breath or wheezing.).   dorzolamide-timolol  (COSOPT) 22.3-6.8 MG/ML ophthalmic solution    famotidine  (PEPCID ) 40 MG tablet Take 1 tablet  (40 mg total) by mouth daily.   FLUoxetine  (PROZAC ) 20 MG capsule Take 2 capsules (40 mg total) by mouth daily.   fluticasone  (FLONASE ) 50 MCG/ACT nasal spray Place 2 sprays into both nostrils daily.   Fluticasone  Furoate (ARNUITY ELLIPTA ) 100 MCG/ACT AEPB Inhale 100 mcg into the lungs daily as needed.   latanoprost (XALATAN) 0.005 % ophthalmic solution 1 drop at bedtime.   Latanoprostene Bunod 0.024 % SOLN Place 1 drop into the left eye daily.   methylcellulose (CITRUCEL) oral powder Take 1 packet by mouth daily.   Omega-3 Fatty Acids (FISH OIL OMEGA-3) 1000 MG CAPS Take by mouth.   pantoprazole  (PROTONIX ) 40 MG tablet TAKE ONE TABLET BY MOUTH once to TWICE (2) DAILY BEFORE A MEAL   rosuvastatin  (CRESTOR ) 10 MG tablet Take 1 tablet (10 mg total) by mouth daily.   spironolactone  (ALDACTONE ) 50 MG tablet Take 1 tablet (50 mg total) by mouth daily.   VITAMIN D  PO Take by mouth.   No facility-administered encounter medications on file as of 08/31/2023.    Allergies (verified) Amoxicillin, Augmentin [amoxicillin-pot clavulanate], Clavulanic acid, Codeine, Depakote [divalproex sodium], Latex, Lipitor [atorvastatin], Penicillins, Sulfamethoxazole, Trimethoprim, Zocor [simvastatin], and Septra [sulfamethoxazole-trimethoprim]   History: Past Medical History:  Diagnosis Date   Anxiety    Arthritis    Phreesia 04/12/2020   Asthma    Cataract    Phreesia 10/25/2019   Cervical spondylosis    Complication of anesthesia    with colonoscopy woke up  twice   Depression    Depression    Phreesia 04/12/2020   GERD (gastroesophageal reflux disease)    Glaucoma    Phreesia 10/25/2019   Hyperlipidemia    Hypertension    IBS (irritable bowel syndrome)    Constipation with diarrhea.   Oral herpes    Past Surgical History:  Procedure Laterality Date   ABDOMINAL HYSTERECTOMY  1978   DUB; ovaries intact.   APPENDECTOMY  1988   BREAST BIOPSY Bilateral 2009   Calcium  Deposit- 1980's and also in  2009/ Right side 1995   BREAST EXCISIONAL BIOPSY Bilateral    BREAST SURGERY N/A    Phreesia 10/25/2019   CATARACT EXTRACTION W/PHACO Left 10/23/2020   Procedure: CATARACT EXTRACTION PHACO AND INTRAOCULAR LENS PLACEMENT (IOC) LEFT TORIC LENS KAHOOK DUAL BLADE GONIOTOMY,  VISION BLUE 7.69 00:53.0;  Surgeon: Annell Kidney, MD;  Location: [facility];  Service: Ophthalmology;  Laterality: Left;   CATARACT EXTRACTION W/PHACO Right 11/20/2020   Procedure: CATARACT EXTRACTION PHACO AND INTRAOCULAR LENS PLACEMENT (IOC) RIGHT LENS KAHOOK DUAL BLADE GONIOTOMY VISION BLUE 8.58 01:24.9;  Surgeon: Annell Kidney, MD;  Location: [facility];  Service: Ophthalmology;  Laterality: Right;  latex   COLONOSCOPY  05/11/2009   Normal.  Previous colonoscopy with polyps. [facility].     COLONOSCOPY WITH PROPOFOL  N/A 12/20/2019   Procedure: COLONOSCOPY WITH PROPOFOL ;  Surgeon: Selena Daily, MD;  Location: [facility];  Service: Gastroenterology;  Laterality: N/A;   DEBRIDEMENT TENNIS ELBOW     ROTATOR CUFF REPAIR     TONSILLECTOMY     UPPER GASTROINTESTINAL ENDOSCOPY     Family History  Problem Relation Age of Onset   Hyperlipidemia Mother    Hypertension Mother    Cancer Mother        lung   Diabetes Mother    Glaucoma Mother    Stroke Mother 66   Heart disease Mother    Asthma Father    Emphysema Father    COPD Father    Glaucoma Father    Hypertension Sister    Colon cancer Maternal Aunt    Breast cancer Paternal Grandmother    Stomach cancer Neg Hx    Esophageal cancer Neg Hx    Rectal cancer Neg Hx    Social History   Socioeconomic History   Marital status: Divorced    Spouse name: Not on file   Number of children: 2   Years of education: Bachelors   Highest education level: Bachelor's degree (e.g., BA, AB, BS)  Occupational History   Occupation: Training and development officer: [employer]    Comment: also works at [employer].   Occupation: retired  Tobacco  Use   Smoking status: Never   Smokeless tobacco: Never  Vaping Use   Vaping status: Never Used  Substance and Sexual Activity   Alcohol use: No   Drug use: Never   Sexual activity: Yes    Partners: Male    Birth control/protection: Surgical, Post-menopausal  Other Topics Concern   Not on file  Social History Narrative   Marital status: divorced since 1999; dating seriously in 2019 since 2015.      Children: one son, one daughter; 2 grandchildren.      Lives: alone in house with 2 dogs     Employment:  Retired.  Needs to work in 2019.      Tobacco: none      Alcohol: rare      Drugs: none  Exercise:  Sporadic   Right-handed.   Occasional caffeine use.      ADLs: independent with ADLs; drives; no assistant devices.      Advanced Directives: none; FULL CODE.   Social Drivers of Corporate investment banker Strain: Low Risk  (08/31/2023)   Overall Financial Resource Strain (CARDIA)    Difficulty of Paying Living Expenses: Not hard at all  Food Insecurity: No Food Insecurity (08/31/2023)   Hunger Vital Sign    Worried About Running Out of Food in the Last Year: Never true    Ran Out of Food in the Last Year: Never true  Transportation Needs: No Transportation Needs (08/31/2023)   PRAPARE - Administrator, Civil Service (Medical): No    Lack of Transportation (Non-Medical): No  Physical Activity: Inactive (08/31/2023)   Exercise Vital Sign    Days of Exercise per Week: 0 days    Minutes of Exercise per Session: 0 min  Stress: No Stress Concern Present (08/31/2023)   [employer] of Occupational Health - Occupational Stress Questionnaire    Feeling of Stress : Not at all  Social Connections: Moderately Isolated (08/31/2023)   Social Connection and Isolation Panel [NHANES]    Frequency of Communication with Friends and Family: More than three times a week    Frequency of Social Gatherings with Friends and Family: Three times a week    Attends Religious  Services: More than 4 times per year    Active Member of Clubs or Organizations: No    Attends Banker Meetings: Never    Marital Status: Divorced    Tobacco Counseling Counseling given: Not Answered   Clinical Intake:  Pre-visit preparation completed: Yes  Pain : No/denies pain     Diabetes: Yes CBG done?: No Did pt. bring in CBG monitor from home?: No  How often do you need to have someone help you when you read instructions, pamphlets, or other written materials from your doctor or pharmacy?: 1 - Never  Interpreter Needed?: No  Information entered by :: Kieth Pelt LPN   Activities of Daily Living    08/31/2023   11:35 AM  In your present state of health, do you have any difficulty performing the following activities:  Hearing? 0  Vision? 0  Difficulty concentrating or making decisions? 0  Walking or climbing stairs? 0  Dressing or bathing? 0  Doing errands, shopping? 0  Preparing Food and eating ? N  Using the Toilet? N  In the past six months, have you accidently leaked urine? N  Do you have problems with loss of bowel control? N  Managing your Medications? N  Managing your Finances? N    Patient Care Team: Benjiman Bras, MD as PCP - General (Family Medicine) Myrle Aspen, [facility] (Inactive) as Pharmacist (Pharmacist)  Indicate any recent Medical Services you may have received from other than [facility] providers in the past year (date may be approximate).     Assessment:   This is a routine wellness examination for [place].  Hearing/Vision screen Hearing Screening - Comments:: No trouble hearing Vision Screening - Comments:: Up to date [facility]   Goals Addressed             This Visit's Progress    Exercise 150 min/wk Moderate Activity   Not on track    Loose weight and exercise more      Exercise 3x per week (30 min per time)  Not on track    Patient states that she wants to try to start exercising more on  a consistent basis.      Weight (lb) < 200 lb (90.7 kg)         Depression Screen    08/31/2023   11:38 AM 08/31/2023   11:36 AM 08/13/2023   10:30 AM 04/15/2023    1:46 PM 02/08/2023   10:38 AM 11/06/2022    9:36 AM 09/25/2022   10:12 AM  PHQ 2/9 Scores  PHQ - 2 Score 0 2 0 0 0 2 2  PHQ- 9 Score 3 5 0 0 3 6 7     Fall Risk    08/31/2023   11:31 AM 08/13/2023   10:30 AM 04/15/2023    1:46 PM 03/04/2023    1:04 PM 02/08/2023   10:38 AM  Fall Risk   Falls in the past year? 0 0 0 0 0  Number falls in past yr: 0 0 0 0 0  Injury with Fall? 0 0 0 0 0  Risk for fall due to :  No Fall Risks No Fall Risks  No Fall Risks  Follow up Falls evaluation completed;Education provided;Falls prevention discussed Falls evaluation completed Falls evaluation completed Falls evaluation completed Falls evaluation completed    MEDICARE RISK AT HOME: Medicare Risk at Home Any stairs in or around the home?: Yes If so, are there any without handrails?: No Home free of loose throw rugs in walkways, pet beds, electrical cords, etc?: Yes Adequate lighting in your home to reduce risk of falls?: Yes Life alert?: No Use of a cane, walker or w/c?: No Grab bars in the bathroom?: Yes Shower chair or bench in shower?: No Elevated toilet seat or a handicapped toilet?: No  TIMED UP AND GO:  Was the test performed?  No    Cognitive Function:        08/31/2023   11:35 AM 08/13/2022    1:39 PM 09/05/2021   11:08 AM 10/26/2019    9:04 AM 02/10/2019   10:22 AM  6CIT Screen  What Year? 0 points 0 points 0 points 0 points 0 points  What month? 0 points 0 points 0 points 0 points 0 points  What time? 0 points 0 points 0 points 0 points 0 points  Count back from 20 0 points 0 points 0 points 0 points 0 points  Months in reverse 0 points 0 points 0 points 0 points 0 points  Repeat phrase  0 points 0 points 0 points 0 points  Total Score  0 points 0 points 0 points 0 points    Immunizations Immunization History   Administered Date(s) Administered   Fluad Quad(high Dose 65+) 02/10/2019   Fluad Trivalent(High Dose 65+) 02/08/2023   Influenza,inj,Quad PF,6+ Mos 02/22/2015, 02/11/2016, 02/10/2017, 02/28/2018   Influenza-Unspecified 04/01/2020, 03/11/2021   PFIZER(Purple Top)SARS-COV-2 Vaccination 07/27/2019, 08/22/2019   Pneumococcal Conjugate-13 04/26/2015   Pneumococcal Polysaccharide-23 05/12/2016    TDAP status: Due, Education has been provided regarding the importance of this vaccine. Advised may receive this vaccine at local pharmacy or Health Dept. Aware to provide a copy of the vaccination record if obtained from local pharmacy or Health Dept. Verbalized acceptance and understanding.  Flu Vaccine status: Up to date  Pneumococcal vaccine status: Up to date  Covid-19 vaccine status: Information provided on how to obtain vaccines.   Qualifies for Shingles Vaccine? No   Zostavax completed No   Shingrix  Completed?: No.    Education  has been provided regarding the importance of this vaccine. Patient has been advised to call insurance company to determine out of pocket expense if they have not yet received this vaccine. Advised may also receive vaccine at local pharmacy or Health Dept. Verbalized acceptance and understanding.  Screening Tests Health Maintenance  Topic Date Due   DTaP/Tdap/Td (1 - Tdap) Never done   Zoster Vaccines- Shingrix  (1 of 2) Never done   COVID-19 Vaccine (3 - Pfizer risk series) 09/19/2019   INFLUENZA VACCINE  12/10/2023   Diabetic kidney evaluation - Urine ACR  05/16/2024   Diabetic kidney evaluation - eGFR measurement  08/12/2024   Medicare Annual Wellness (AWV)  08/30/2024   MAMMOGRAM  03/02/2025   Colonoscopy  12/19/2029   Pneumonia Vaccine 76+ Years old  Completed   DEXA SCAN  Completed   Hepatitis C Screening  Completed   HPV VACCINES  Aged Out   Meningococcal B Vaccine  Aged Out    Health Maintenance  Health Maintenance Due  Topic Date Due    DTaP/Tdap/Td (1 - Tdap) Never done   Zoster Vaccines- Shingrix  (1 of 2) Never done   COVID-19 Vaccine (3 - Pfizer risk series) 09/19/2019    Colorectal cancer screening: Type of screening: Colonoscopy. Completed 2021. Repeat every 10 years  Mammogram status: Completed  . Repeat every year  Bone Density   Education provided  Lung Cancer Screening: (Low Dose CT Chest recommended if Age 71-80 years, 20 pack-year currently smoking OR have quit w/in 15years.) does not qualify.   Lung Cancer Screening Referral:   Additional Screening:  Hepatitis C Screening: does not qualify; Completed 2016  Vision Screening: Recommended annual ophthalmology exams for early detection of glaucoma and other disorders of the eye. Is the patient up to date with their annual eye exam?  Yes  Who is the provider or what is the name of the office in which the patient attends annual eye exams? [facility] If pt is not established with a provider, would they like to be referred to a provider to establish care? No .   Dental Screening: Recommended annual dental exams for proper oral hygiene  Patient is pre diabetic with diet .  Does check blood sugar periodically   today it was 114  Community Resource Referral / Chronic Care Management: CRR required this visit?  No   CCM required this visit?  No     Plan:     I have personally reviewed and noted the following in the patient's chart:   Medical and social history Use of alcohol, tobacco or illicit drugs  Current medications and supplements including opioid prescriptions. Patient is not currently taking opioid prescriptions. Functional ability and status Nutritional status Physical activity Advanced directives List of other physicians Hospitalizations, surgeries, and ER visits in previous 12 months Vitals Screenings to include cognitive, depression, and falls Referrals and appointments  In addition, I have reviewed and discussed with patient certain  preventive protocols, quality metrics, and best practice recommendations. A written personalized care plan for preventive services as well as general preventive health recommendations were provided to patient.     Kieth Pelt, LPN   [id number]/61/[id number]   After Visit Summary: (MyChart) Due to this being a telephonic visit, the after visit summary with patients personalized plan was offered to patient via MyChart   Nurse Notes:

## 2023-08-31 NOTE — Patient Instructions (Signed)
 Maria Sweeney , Thank you for taking time to come for your Medicare Wellness Visit. I appreciate your ongoing commitment to your health goals. Please review the following plan we discussed and let me know if I can assist you in the future.   Screening recommendations/referrals: Colonoscopy: up to date Mammogram: up to date Bone Density: up to date Recommended yearly ophthalmology/optometry visit for glaucoma screening and checkup Recommended yearly dental visit for hygiene and checkup  Vaccinations: Influenza vaccine: up to date Pneumococcal vaccine: up to date Tdap vaccine: Education provided Shingles vaccine: Education provided     Preventive Care 65 Years and Older, Female Preventive care refers to lifestyle choices and visits with your health care provider that can promote health and wellness. What does preventive care include? A yearly physical exam. This is also called an annual well check. Dental exams once or twice a year. Routine eye exams. Ask your health care provider how often you should have your eyes checked. Personal lifestyle choices, including: Daily care of your teeth and gums. Regular physical activity. Eating a healthy diet. Avoiding tobacco and drug use. Limiting alcohol use. Practicing safe sex. Taking low-dose aspirin every day. Taking vitamin and mineral supplements as recommended by your health care provider. What happens during an annual well check? The services and screenings done by your health care provider during your annual well check will depend on your age, overall health, lifestyle risk factors, and family history of disease. Counseling  Your health care provider may ask you questions about your: Alcohol use. Tobacco use. Drug use. Emotional well-being. Home and relationship well-being. Sexual activity. Eating habits. History of falls. Memory and ability to understand (cognition). Work and work Astronomer. Reproductive health. Screening   You may have the following tests or measurements: Height, weight, and BMI. Blood pressure. Lipid and cholesterol levels. These may be checked every 5 years, or more frequently if you are over 36 years old. Skin check. Lung cancer screening. You may have this screening every year starting at age 38 if you have a 30-pack-year history of smoking and currently smoke or have quit within the past 15 years. Fecal occult blood test (FOBT) of the stool. You may have this test every year starting at age 28. Flexible sigmoidoscopy or colonoscopy. You may have a sigmoidoscopy every 5 years or a colonoscopy every 10 years starting at age 23. Hepatitis C blood test. Hepatitis B blood test. Sexually transmitted disease (STD) testing. Diabetes screening. This is done by checking your blood sugar (glucose) after you have not eaten for a while (fasting). You may have this done every 1-3 years. Bone density scan. This is done to screen for osteoporosis. You may have this done starting at age 2. Mammogram. This may be done every 1-2 years. Talk to your health care provider about how often you should have regular mammograms. Talk with your health care provider about your test results, treatment options, and if necessary, the need for more tests. Vaccines  Your health care provider may recommend certain vaccines, such as: Influenza vaccine. This is recommended every year. Tetanus, diphtheria, and acellular pertussis (Tdap, Td) vaccine. You may need a Td booster every 10 years. Zoster vaccine. You may need this after age 26. Pneumococcal 13-valent conjugate (PCV13) vaccine. One dose is recommended after age 42. Pneumococcal polysaccharide (PPSV23) vaccine. One dose is recommended after age 22. Talk to your health care provider about which screenings and vaccines you need and how often you need them. This information is not  intended to replace advice given to you by your health care provider. Make sure you discuss  any questions you have with your health care provider. Document Released: 05/24/2015 Document Revised: 01/15/2016 Document Reviewed: 02/26/2015 Elsevier Interactive Patient Education  2017 [employer].  Fall Prevention in the Home Falls can cause injuries. They can happen to people of all ages. There are many things you can do to make your home safe and to help prevent falls. What can I do on the outside of my home? Regularly fix the edges of walkways and driveways and fix any cracks. Remove anything that might make you trip as you walk through a door, such as a raised step or threshold. Trim any bushes or trees on the path to your home. Use bright outdoor lighting. Clear any walking paths of anything that might make someone trip, such as rocks or tools. Regularly check to see if handrails are loose or broken. Make sure that both sides of any steps have handrails. Any raised decks and porches should have guardrails on the edges. Have any leaves, snow, or ice cleared regularly. Use sand or salt on walking paths during winter. Clean up any spills in your garage right away. This includes oil or grease spills. What can I do in the bathroom? Use night lights. Install grab bars by the toilet and in the tub and shower. Do not use towel bars as grab bars. Use non-skid mats or decals in the tub or shower. If you need to sit down in the shower, use a plastic, non-slip stool. Keep the floor dry. Clean up any water that spills on the floor as soon as it happens. Remove soap buildup in the tub or shower regularly. Attach bath mats securely with double-sided non-slip rug tape. Do not have throw rugs and other things on the floor that can make you trip. What can I do in the bedroom? Use night lights. Make sure that you have a light by your bed that is easy to reach. Do not use any sheets or blankets that are too big for your bed. They should not hang down onto the floor. Have a firm chair that has  side arms. You can use this for support while you get dressed. Do not have throw rugs and other things on the floor that can make you trip. What can I do in the kitchen? Clean up any spills right away. Avoid walking on wet floors. Keep items that you use a lot in easy-to-reach places. If you need to reach something above you, use a strong step stool that has a grab bar. Keep electrical cords out of the way. Do not use floor polish or wax that makes floors slippery. If you must use wax, use non-skid floor wax. Do not have throw rugs and other things on the floor that can make you trip. What can I do with my stairs? Do not leave any items on the stairs. Make sure that there are handrails on both sides of the stairs and use them. Fix handrails that are broken or loose. Make sure that handrails are as long as the stairways. Check any carpeting to make sure that it is firmly attached to the stairs. Fix any carpet that is loose or worn. Avoid having throw rugs at the top or bottom of the stairs. If you do have throw rugs, attach them to the floor with carpet tape. Make sure that you have a light switch at the top of the stairs  and the bottom of the stairs. If you do not have them, ask someone to add them for you. What else can I do to help prevent falls? Wear shoes that: Do not have high heels. Have rubber bottoms. Are comfortable and fit you well. Are closed at the toe. Do not wear sandals. If you use a stepladder: Make sure that it is fully opened. Do not climb a closed stepladder. Make sure that both sides of the stepladder are locked into place. Ask someone to hold it for you, if possible. Clearly mark and make sure that you can see: Any grab bars or handrails. First and last steps. Where the edge of each step is. Use tools that help you move around (mobility aids) if they are needed. These include: Canes. Walkers. Scooters. Crutches. Turn on the lights when you go into a dark area.  Replace any light bulbs as soon as they burn out. Set up your furniture so you have a clear path. Avoid moving your furniture around. If any of your floors are uneven, fix them. If there are any pets around you, be aware of where they are. Review your medicines with your doctor. Some medicines can make you feel dizzy. This can increase your chance of falling. Ask your doctor what other things that you can do to help prevent falls. This information is not intended to replace advice given to you by your health care provider. Make sure you discuss any questions you have with your health care provider. Document Released: 02/21/2009 Document Revised: 10/03/2015 Document Reviewed: 06/01/2014 Elsevier Interactive Patient Education  2017 [employer].

## 2023-10-14 ENCOUNTER — Other Ambulatory Visit: Payer: Self-pay | Admitting: Gastroenterology

## 2023-10-14 DIAGNOSIS — K219 Gastro-esophageal reflux disease without esophagitis: Secondary | ICD-10-CM

## 2023-11-08 ENCOUNTER — Other Ambulatory Visit: Payer: Self-pay | Admitting: Family Medicine

## 2023-11-08 DIAGNOSIS — R052 Subacute cough: Secondary | ICD-10-CM

## 2023-12-06 ENCOUNTER — Emergency Department

## 2023-12-06 ENCOUNTER — Emergency Department
Admission: EM | Admit: 2023-12-06 | Discharge: 2023-12-06 | Disposition: A | Discharge status: Home / Self Care | Attending: Emergency Medicine | Admitting: Emergency Medicine

## 2023-12-06 ENCOUNTER — Other Ambulatory Visit: Payer: Self-pay

## 2023-12-06 DIAGNOSIS — S0992XA Unspecified injury of nose, initial encounter: Secondary | ICD-10-CM | POA: Insufficient documentation

## 2023-12-06 DIAGNOSIS — S022XXA Fracture of nasal bones, initial encounter for closed fracture: Secondary | ICD-10-CM | POA: Diagnosis not present

## 2023-12-06 DIAGNOSIS — W228XXA Striking against or struck by other objects, initial encounter: Secondary | ICD-10-CM | POA: Insufficient documentation

## 2023-12-06 DIAGNOSIS — I10 Essential (primary) hypertension: Secondary | ICD-10-CM | POA: Diagnosis not present

## 2023-12-06 HISTORY — DX: Type 2 diabetes mellitus without complications: E11.9

## 2023-12-06 NOTE — ED Provider Triage Note (Signed)
 Emergency Medicine Provider Triage Evaluation Note  Maria Sweeney , a 73 y.o. female  was evaluated in triage.  Pt complains of nose trauma.  States today she was in her garage and hit her nose with a metal with posterior face pain, denies epistaxis, nasal congestion.  Review of Systems  Positive:  Negative:  Physical Exam  There were no vitals taken for this visit. Gen:   Awake, no distress   Resp:  Normal effort  MSK:   Moves extremities without difficulty  Other:  Notes: Presence of hematoma at the level of the nasal bridge, mild deformity  Medical Decision Making  Medically screening exam initiated at 5:25 PM.  Appropriate orders placed.  Barnie VEAR Gully was informed that the remainder of the evaluation will be completed by another provider, this initial triage assessment does not replace that evaluation, and the importance of remaining in the ED until their evaluation is complete.  With history of nasal trauma today with posterior facial pain no epistaxis, no nasal congestion.  Ordered CT maxillofacial   Janit Kast, PA-C 12/06/23 1726

## 2023-12-06 NOTE — ED Provider Notes (Signed)
 [facility] Provider Note    Event Date/Time   First MD Initiated Contact with Patient 12/06/23 1744     (approximate)   History   Facial Injury   HPI Maria Sweeney is a 73 y.o. female  with a past medical history of anxiety, depression, hypertension, DDD presents to the emergency department with nasal pain following an impact around 4 p.m. today. Patient states she was in her garage and hit her nose on a steel pole of a car jack while bending down. States she felt a pop. Denies nasal congestion, epistaxis, otalgia, hitting her head, headache, LOC, vision changes, fall, dyspnea. Patient has been using ice at home for pain and swelling.     Physical Exam   Triage Vital Signs: ED Triage Vitals  Encounter Vitals Group     BP 12/06/23 1725 (!) 161/76     Girls Systolic BP Percentile --      Girls Diastolic BP Percentile --      Boys Systolic BP Percentile --      Boys Diastolic BP Percentile --      Pulse Rate 12/06/23 1725 61     Resp 12/06/23 1725 20     Temp 12/06/23 1725 98.1 F (36.7 C)     Temp Source 12/06/23 1725 Oral     SpO2 12/06/23 1725 99 %     Weight 12/06/23 1727 130 lb (59 kg)     Height 12/06/23 1727 5' (1.524 m)     Head Circumference --      Peak Flow --      Pain Score 12/06/23 1726 6     Pain Loc --      Pain Education --      Exclude from Growth Chart --     Most recent vital signs: Vitals:   12/06/23 1725 12/06/23 1847  BP: (!) 161/76 (!) 153/71  Pulse: 61 67  Resp: 20 20  Temp: 98.1 F (36.7 C)   SpO2: 99% 99%    General: Awake, in no acute distress. Appears stated age. Head: Normocephalic, atraumatic. Ears/Nose/Throat: No septal deviation or polyps. Nares patent without discharge. TTP along nasal bridge b/l. Neck: Supple. CV: Regular rate, 67 bpm. Peripheral pulses 2+ and symmetric.  Respiratory:  No respiratory distress. Normal respiratory effort. Skin:Warm, dry, intact. Edema and erythema noted to nasal  bridge b/l. Neurological: A&Ox4 to person, place, time, and situation.  ED Results / Procedures / Treatments   Labs (all labs ordered are listed, but only abnormal results are displayed) Labs Reviewed - No data to display   EKG     RADIOLOGY CT maxillofacial ordered.  IMPRESSION: No facial or orbital fracture.  No evidence of nasal fracture.   PROCEDURES:  Critical Care performed: No   Procedures   MEDICATIONS ORDERED IN ED: Medications - No data to display   IMPRESSION / MDM / ASSESSMENT AND PLAN / ED COURSE  I reviewed the triage vital signs and the nursing notes.                              Differential diagnosis includes, but is not limited to, nasal contusion, epistaxis, nasal fracture, septal deviation  Patient's presentation is most consistent with acute complicated illness / injury requiring diagnostic workup.  Patient is a 73 y/o female presenting following nasal injury. No other concerns at this time. CT maxillofacial ordered, without any facial, orbital, or  nasal fracture. No evidence of bleed or discharge from the nose. Encouraged patient to refrain from blowing her nose. Discussed instructions if she were to have a future nose bleed. She can use ice for pain and swelling as well as OTC pain medications as needed. Patient can follow up with her PCP as needed or in the ER for any new, concerning, or worsening symptoms.   Patient was given the opportunity to ask questions; all questions were answered. Emergency department return precautions were discussed with the patient.  Patient is in agreement to the treatment plan.  Patient is stable for discharge.    FINAL CLINICAL IMPRESSION(S) / ED DIAGNOSES   Final diagnoses:  Nasal injury, initial encounter     Rx / DC Orders   ED Discharge Orders     None        Note:  This document was prepared using Dragon voice recognition software and may include unintentional dictation errors.     Sheron Salm, PA-C 12/06/23 1936    Arlander Charleston, MD 12/06/23 2034

## 2023-12-06 NOTE — Discharge Instructions (Addendum)
 You were seen in the emergency department for a nasal injury.  Your images taken today did not show any fracture. If you experience any nosebleeds, please apply direct pressure to the affected area for 10 to 15 minutes.  You can apply ice to the area to help with any swelling.  You may follow-up with your primary care provider as needed.  Return to the emergency department if you develop new or worsening symptoms that concern you.

## 2023-12-06 NOTE — ED Triage Notes (Signed)
 Pt to ED for nose injury today from steel pipe that hit nose from below while she was stooping down. She felt something pop.   No nasal congestion. Pain to bridge of nose bilaterally. Some bruising noted to bridge of nose bilaterally.  PA at bedside in triage.

## 2023-12-13 DIAGNOSIS — L2989 Other pruritus: Secondary | ICD-10-CM | POA: Diagnosis not present

## 2023-12-13 DIAGNOSIS — Z85828 Personal history of other malignant neoplasm of skin: Secondary | ICD-10-CM | POA: Diagnosis not present

## 2023-12-13 DIAGNOSIS — L814 Other melanin hyperpigmentation: Secondary | ICD-10-CM | POA: Diagnosis not present

## 2023-12-13 DIAGNOSIS — L988 Other specified disorders of the skin and subcutaneous tissue: Secondary | ICD-10-CM | POA: Diagnosis not present

## 2023-12-13 DIAGNOSIS — L538 Other specified erythematous conditions: Secondary | ICD-10-CM | POA: Diagnosis not present

## 2023-12-13 DIAGNOSIS — D225 Melanocytic nevi of trunk: Secondary | ICD-10-CM | POA: Diagnosis not present

## 2023-12-13 DIAGNOSIS — L821 Other seborrheic keratosis: Secondary | ICD-10-CM | POA: Diagnosis not present

## 2023-12-13 DIAGNOSIS — L820 Inflamed seborrheic keratosis: Secondary | ICD-10-CM | POA: Diagnosis not present

## 2023-12-28 DIAGNOSIS — M65341 Trigger finger, right ring finger: Secondary | ICD-10-CM | POA: Diagnosis not present

## 2023-12-28 DIAGNOSIS — M65331 Trigger finger, right middle finger: Secondary | ICD-10-CM | POA: Diagnosis not present

## 2023-12-28 DIAGNOSIS — M25741 Osteophyte, right hand: Secondary | ICD-10-CM | POA: Diagnosis not present

## 2023-12-28 DIAGNOSIS — G5602 Carpal tunnel syndrome, left upper limb: Secondary | ICD-10-CM | POA: Diagnosis not present

## 2023-12-28 DIAGNOSIS — M65321 Trigger finger, right index finger: Secondary | ICD-10-CM | POA: Diagnosis not present

## 2024-01-10 HISTORY — PX: CARPAL TUNNEL RELEASE: SHX101

## 2024-01-13 DIAGNOSIS — G8918 Other acute postprocedural pain: Secondary | ICD-10-CM | POA: Insufficient documentation

## 2024-01-14 DIAGNOSIS — G5602 Carpal tunnel syndrome, left upper limb: Secondary | ICD-10-CM | POA: Diagnosis not present

## 2024-02-11 DIAGNOSIS — H401133 Primary open-angle glaucoma, bilateral, severe stage: Secondary | ICD-10-CM | POA: Diagnosis not present

## 2024-02-11 DIAGNOSIS — Z961 Presence of intraocular lens: Secondary | ICD-10-CM | POA: Diagnosis not present

## 2024-02-11 DIAGNOSIS — H538 Other visual disturbances: Secondary | ICD-10-CM | POA: Diagnosis not present

## 2024-02-11 DIAGNOSIS — H04123 Dry eye syndrome of bilateral lacrimal glands: Secondary | ICD-10-CM | POA: Diagnosis not present

## 2024-02-14 ENCOUNTER — Ambulatory Visit: Admitting: Family Medicine

## 2024-02-14 VITALS — BP 116/62 | HR 59 | Temp 97.9°F | Resp 12 | Ht 60.0 in | Wt 129.8 lb

## 2024-02-14 DIAGNOSIS — F32A Depression, unspecified: Secondary | ICD-10-CM

## 2024-02-14 DIAGNOSIS — R0982 Postnasal drip: Secondary | ICD-10-CM | POA: Diagnosis not present

## 2024-02-14 DIAGNOSIS — R5383 Other fatigue: Secondary | ICD-10-CM

## 2024-02-14 DIAGNOSIS — Z0000 Encounter for general adult medical examination without abnormal findings: Secondary | ICD-10-CM | POA: Diagnosis not present

## 2024-02-14 DIAGNOSIS — E1165 Type 2 diabetes mellitus with hyperglycemia: Secondary | ICD-10-CM | POA: Diagnosis not present

## 2024-02-14 DIAGNOSIS — Z23 Encounter for immunization: Secondary | ICD-10-CM | POA: Diagnosis not present

## 2024-02-14 DIAGNOSIS — F5104 Psychophysiologic insomnia: Secondary | ICD-10-CM

## 2024-02-14 DIAGNOSIS — I10 Essential (primary) hypertension: Secondary | ICD-10-CM | POA: Diagnosis not present

## 2024-02-14 DIAGNOSIS — F419 Anxiety disorder, unspecified: Secondary | ICD-10-CM

## 2024-02-14 DIAGNOSIS — R519 Headache, unspecified: Secondary | ICD-10-CM

## 2024-02-14 DIAGNOSIS — E7800 Pure hypercholesterolemia, unspecified: Secondary | ICD-10-CM

## 2024-02-14 DIAGNOSIS — R052 Subacute cough: Secondary | ICD-10-CM | POA: Diagnosis not present

## 2024-02-14 DIAGNOSIS — N949 Unspecified condition associated with female genital organs and menstrual cycle: Secondary | ICD-10-CM

## 2024-02-14 LAB — CBC
HCT: 43.6 % (ref 36.0–46.0)
Hemoglobin: 14.7 g/dL (ref 12.0–15.0)
MCHC: 33.7 g/dL (ref 30.0–36.0)
MCV: 95 fl (ref 78.0–100.0)
Platelets: 262 K/uL (ref 150.0–400.0)
RBC: 4.59 Mil/uL (ref 3.87–5.11)
RDW: 13 % (ref 11.5–15.5)
WBC: 7 K/uL (ref 4.0–10.5)

## 2024-02-14 LAB — HEMOGLOBIN A1C: Hgb A1c MFr Bld: 6.5 % (ref 4.6–6.5)

## 2024-02-14 LAB — SEDIMENTATION RATE: Sed Rate: 12 mm/h (ref 0–30)

## 2024-02-14 MED ORDER — FLUTICASONE PROPIONATE 50 MCG/ACT NA SUSP
2.0000 | Freq: Every day | NASAL | 6 refills | Status: AC
Start: 2024-02-14 — End: ?

## 2024-02-14 MED ORDER — SPIRONOLACTONE 50 MG PO TABS: 50.0000 mg | ORAL_TABLET | Freq: Every day | ORAL | 1 refills | Status: AC

## 2024-02-14 MED ORDER — FLUOXETINE HCL 20 MG PO CAPS
40.0000 mg | ORAL_CAPSULE | Freq: Every day | ORAL | 1 refills | Status: AC
Start: 2024-02-14 — End: ?

## 2024-02-14 NOTE — Progress Notes (Signed)
 Subjective:  Patient ID: Maria Sweeney, female    DOB: [birth year]  Age: 73 y.o. MRN: [id number]  CC:  Chief Complaint  Patient presents with   Annual Exam    No questions or concerns.     HPI TRIVIA HEFFELFINGER presents for Annual Exam  Married last month, had been together for 11 years, and today is her birthday.   Acute/new concerns:  Fatigue: Occasional fatigue in middle of day - 80's, 70s. Feels better if she eats a snack.  3 meals per day, lunch around 1-3pm, notes fatigue few hours after lunch. No syncope. Sweats at the times.  No chest pain/palpitations.   R sided headache/pressure: Past few days. No prior similar sx's. No jaw pain, no amaurosis fugax or visual symptoms. Comes and goes. No treatments. No pain.  Does grind teeth, has dental device.  Denies any jaw pain at present.  PCP, me Gastroenterology, Dr. Leigh, Protonix  40 mg twice daily for GERD, with possible upper airway irritation component.  Dermatology, Dr. Orman, appointment in February. Ophthalmology, Dr. Mittie, appointment in April GYN: none. Has small wart like lesion for a few years on labia, no pain, no changes. Would like to meet with GYN to discuss. No recent pap testing.   Chronic cough As above, history of upper airway irritation with possible GERD component.  Treated with Protonix  twice daily, Pepcid , Flonase  and Arnuity Ellipta  intermittently as needed.  Has albuterol  if needed. Using arnuity last few nights for some drainage   Diabetes: With prior prediabetes, A1c had increased in September of last year to diabetic level, but she made significant changes in her diet, with subsequent weight loss and improved A1c both in January and then further improvements on recheck in April.  Weight has improved a few more pounds since that time. Microalbumin: Normal on 05/17/2023 Fatigue after lunch as above.  Lab Results  Component Value Date   HGBA1C 6.3 08/13/2023   HGBA1C 6.7 (H) 05/17/2023    HGBA1C 8.1 (H) 02/08/2023   Lab Results  Component Value Date   LDLCALC 83 08/13/2023   CREATININE 0.89 08/13/2023   Wt Readings from Last 3 Encounters:  02/14/24 129 lb 12.8 oz (58.9 kg)  12/06/23 130 lb (59 kg)  08/13/23 132 lb (59.9 kg)   Hyperlipidemia: Treated with Crestor  10 mg daily without new myalgias/side effects.  Normalization of LFTs previously, possible hepatic steatosis noted with prior elevated LFTs.  Numbers improved with weight loss. Lab Results  Component Value Date   CHOL 156 08/13/2023   HDL 41.50 08/13/2023   LDLCALC 83 08/13/2023   LDLDIRECT 99.0 09/12/2021   TRIG 160.0 (H) 08/13/2023   CHOLHDL 4 08/13/2023   Lab Results  Component Value Date   ALT 26 08/13/2023   AST 25 08/13/2023   ALKPHOS 81 08/13/2023   BILITOT 0.8 08/13/2023   Hypertension: Treated with spironolactone  50 mg daily. Home readings: similar readings.  BP Readings from Last 3 Encounters:  02/14/24 116/62  12/06/23 (!) 153/71  08/13/23 124/62   Lab Results  Component Value Date   CREATININE 0.89 08/13/2023   Depression with insomnia Insomnia, intolerant to mirtazapine  due to side effects.  Minimal change in symptoms with higher dosing of Prozac  above 40 mg, only temporary improvement of sleep with Ambien and not sure if melatonin was helpful.  Hydroxyzine  with minimal relief and intolerant to side effects the next day along with urinary side effects.  Minimal change in symptoms with addition of trazodone .  We have discussed meeting with a therapist previously, with intermittent sleep difficulties.  Declined therapist as of her April visit. Prozac  40 mg daily. Did not meet with therapist. Still some trouble with sleep at times. Considering meeting with therapist.      02/14/2024   10:23 AM 08/31/2023   11:38 AM 08/31/2023   11:36 AM 08/13/2023   10:30 AM 04/15/2023    1:46 PM  Depression screen PHQ 2/9  Decreased Interest 0 0 2 0 0  Down, Depressed, Hopeless 0 0 0 0 0  PHQ - 2  Score 0 0 2 0 0  Altered sleeping 2 3 3  0 0  Tired, decreased energy 1 0 0 0 0  Change in appetite 0 0 0 0 0  Feeling bad or failure about yourself  0 0 0 0 0  Trouble concentrating 1 0 0 0 0  Moving slowly or fidgety/restless 0 0 0 0 0  Suicidal thoughts 0  0 0 0  PHQ-9 Score 4 3 5  0 0  Difficult doing work/chores Not difficult at all        Health Maintenance  Topic Date Due   Diabetic kidney evaluation - Urine ACR  Never done   Zoster Vaccines- Shingrix  (1 of 2) Never done   DTaP/Tdap/Td (1 - Tdap) 08/14/2024 (Originally 02/13/1970)   COVID-19 Vaccine (3 - Pfizer risk series) 08/14/2024 (Originally 09/19/2019)   Diabetic kidney evaluation - eGFR measurement  08/12/2024   Medicare Annual Wellness (AWV)  08/30/2024   Mammogram  03/02/2025   Colonoscopy  12/19/2029   Pneumococcal Vaccine: 50+ Years  Completed   Influenza Vaccine  Completed   DEXA SCAN  Completed   Hepatitis C Screening  Completed   Meningococcal B Vaccine  Aged Out  Colonoscopy 12/20/2019, repeat in 7 to 10 years based on pathology results.  Benign polyps, repeat 10 years.  Dr. Unk Mammogram October 2024, diagnostic at that time, no mammographic or sonographic evidence of malignancy.  Routine annual screening mammogram recommended in 1 year.plans to schedule.  Partial hysterectomy at 73yo, benign - excessive bleeding.    Immunization History  Administered Date(s) Administered   Fluad Quad(high Dose 65+) 02/10/2019   Fluad Trivalent(High Dose 65+) 02/08/2023   INFLUENZA, HIGH DOSE SEASONAL PF 02/14/2024   Influenza,inj,Quad PF,6+ Mos 02/22/2015, 02/11/2016, 02/10/2017, 02/28/2018   Influenza-Unspecified 04/01/2020, 03/11/2021   PFIZER(Purple Top)SARS-COV-2 Vaccination 07/27/2019, 08/22/2019   Pneumococcal Conjugate-13 04/26/2015   Pneumococcal Polysaccharide-23 05/12/2016  Flu vaccine today  Covid booster - declines.  Prevnar 20 today   No results found. Regular care with optho - recent visit.    Dental:Within Last 6 months, recent visit with cleaning.   Alcohol:none  Tobacco: none  Exercise: walks 2 times per week, 5k - 1 hour.    History Patient Active Problem List   Diagnosis Date Noted   Encounter for screening colonoscopy    Vitamin D  deficiency 08/02/2018   Prediabetes 08/02/2018   Carpal tunnel syndrome 02/10/2018   Tinnitus 06/25/2016   Numbness 04/13/2016   Loss of smell 04/13/2016   Pure hypercholesterolemia 11/30/2014   Degenerative disc disease, cervical 04/24/2014   Essential hypertension, benign 10/19/2013   Insomnia 10/19/2013   Herpes labialis 10/19/2013   Anxiety and depression 07/25/2012   GERD (gastroesophageal reflux disease) 07/25/2012   Past Medical History:  Diagnosis Date   Allergy    Anxiety    Arthritis    Phreesia 04/12/2020   Asthma    Cataract    Phreesia 10/25/2019  Cervical spondylosis    Complication of anesthesia    with colonoscopy woke up twice   Depression    Depression    Phreesia 04/12/2020   Diabetes mellitus without complication (HCC)    managed with diet and lifestyle   GERD (gastroesophageal reflux disease)    Glaucoma    Phreesia 10/25/2019   Hyperlipidemia    Hypertension    IBS (irritable bowel syndrome)    Constipation with diarrhea.   Oral herpes    Past Surgical History:  Procedure Laterality Date   ABDOMINAL HYSTERECTOMY  1978   DUB; ovaries intact.   APPENDECTOMY  1988   BREAST BIOPSY Bilateral 2009   Calcium  Deposit- 1980's and also in 2009/ Right side 1995   BREAST EXCISIONAL BIOPSY Bilateral    BREAST SURGERY N/A    Phreesia 10/25/2019   CARPAL TUNNEL RELEASE Left 01/2024   CATARACT EXTRACTION W/PHACO Left 10/23/2020   Procedure: CATARACT EXTRACTION PHACO AND INTRAOCULAR LENS PLACEMENT (IOC) LEFT TORIC LENS KAHOOK DUAL BLADE GONIOTOMY,  VISION BLUE 7.69 00:53.0;  Surgeon: Mittie Gaskin, MD;  Location: [facility];  Service: Ophthalmology;  Laterality: Left;   CATARACT  EXTRACTION W/PHACO Right 11/20/2020   Procedure: CATARACT EXTRACTION PHACO AND INTRAOCULAR LENS PLACEMENT (IOC) RIGHT LENS KAHOOK DUAL BLADE GONIOTOMY VISION BLUE 8.58 01:24.9;  Surgeon: Mittie Gaskin, MD;  Location: [facility];  Service: Ophthalmology;  Laterality: Right;  latex   COLONOSCOPY  05/11/2009   Normal.  Previous colonoscopy with polyps. [place].     COLONOSCOPY WITH PROPOFOL  N/A 12/20/2019   Procedure: COLONOSCOPY WITH PROPOFOL ;  Surgeon: Unk Corinn Skiff, MD;  Location: [facility];  Service: Gastroenterology;  Laterality: N/A;   DEBRIDEMENT TENNIS ELBOW     EYE SURGERY  2022   Cataracts   ROTATOR CUFF REPAIR     TONSILLECTOMY     UPPER GASTROINTESTINAL ENDOSCOPY     Allergies  Allergen Reactions   Amoxicillin Anaphylaxis   Augmentin [Amoxicillin-Pot Clavulanate]     Numbness around mouth, throat tightness   Clavulanic Acid    Codeine Itching   Depakote [Divalproex Sodium]     hives   Latex Hives   Lipitor [Atorvastatin]     Muscles pain   Penicillins     Throat tightness, mouth numb   Sulfamethoxazole    Trimethoprim    Zocor [Simvastatin]     Muscle pain   Septra [Sulfamethoxazole-Trimethoprim] Rash   Prior to Admission medications   Medication Sig Start Date End Date Taking? Authorizing Provider  albuterol  (PROVENTIL  HFA;VENTOLIN  HFA) 108 (90 Base) MCG/ACT inhaler Inhale 2 puffs into the lungs every 4 (four) hours as needed for wheezing or shortness of breath (cough, shortness of breath or wheezing.). 02/11/16  Yes Claudene Rayfield HERO, MD  ARNUITY ELLIPTA  100 MCG/ACT AEPB INHALE ONE PUFF INTO THE LUNGS DAILY AS NEEDED. 11/08/23  Yes Levora Reyes SAUNDERS, MD  dorzolamide-timolol  (COSOPT) 22.3-6.8 MG/ML ophthalmic solution  12/25/19  Yes [provider]  famotidine  (PEPCID ) 40 MG tablet Take 1 tablet (40 mg total) by mouth daily. 08/13/23  Yes Levora Reyes SAUNDERS, MD  FLUoxetine  (PROZAC ) 20 MG capsule Take 2 capsules (40 mg total) by mouth daily.  08/13/23  Yes Levora Reyes SAUNDERS, MD  fluticasone  (FLONASE ) 50 MCG/ACT nasal spray Place 2 sprays into both nostrils daily. 08/13/23  Yes Levora Reyes SAUNDERS, MD  latanoprost (XALATAN) 0.005 % ophthalmic solution 1 drop at bedtime.   Yes [provider]  methylcellulose (CITRUCEL) oral powder Take 1 packet by  mouth daily. 03/20/22  Yes Armbruster, Elspeth SQUIBB, MD  Omega-3 Fatty Acids (FISH OIL OMEGA-3) 1000 MG CAPS Take by mouth.   Yes [provider]  ondansetron  (ZOFRAN -ODT) 4 MG disintegrating tablet Take 2 mg by mouth every 6 (six) hours as needed. 01/13/24  Yes [provider]  pantoprazole  (PROTONIX ) 40 MG tablet TAKE ONE TABLET BY MOUTH ONE TO TWO TIMES DAILY BEFORE MEALS 10/14/23  Yes Armbruster, Elspeth SQUIBB, MD  rosuvastatin  (CRESTOR ) 10 MG tablet Take 1 tablet (10 mg total) by mouth daily. 08/13/23  Yes Levora Reyes SAUNDERS, MD  spironolactone  (ALDACTONE ) 50 MG tablet Take 1 tablet (50 mg total) by mouth daily. 08/13/23  Yes Levora Reyes SAUNDERS, MD  VITAMIN D  PO Take by mouth.   Yes [provider]  Latanoprostene Bunod 0.024 % SOLN Place 1 drop into the left eye daily.    [provider]  valACYclovir  (VALTREX ) 1000 MG tablet Take by mouth as needed.    [provider]   Social History   Socioeconomic History   Marital status: Married    Spouse name: Not on file   Number of children: 2   Years of education: Bachelors   Highest education level: Bachelor's degree (e.g., BA, AB, BS)  Occupational History   Occupation: Training and development officer: [employer]    Comment: also works at [employer].   Occupation: retired  Tobacco Use   Smoking status: Never   Smokeless tobacco: Never  Vaping Use   Vaping status: Never Used  Substance and Sexual Activity   Alcohol use: Never   Drug use: Never   Sexual activity: Yes    Partners: Male    Birth control/protection: Surgical, Post-menopausal  Other Topics Concern   Not on file  Social History Narrative    Marital status: divorced since 1999; dating seriously in 2019 since 2015.      Children: one son, one daughter; 2 grandchildren.      Lives: alone in house with 2 dogs     Employment:  Retired.  Needs to work in 2019.      Tobacco: none      Alcohol: rare      Drugs: none      Exercise:  Sporadic   Right-handed.   Occasional caffeine use.      ADLs: independent with ADLs; drives; no assistant devices.      Advanced Directives: none; FULL CODE.   Social Drivers of Corporate investment banker Strain: Low Risk  (02/14/2024)   Overall Financial Resource Strain (CARDIA)    Difficulty of Paying Living Expenses: Not hard at all  Food Insecurity: No Food Insecurity (02/14/2024)   Hunger Vital Sign    Worried About Running Out of Food in the Last Year: Never true    Ran Out of Food in the Last Year: Never true  Transportation Needs: No Transportation Needs (02/14/2024)   PRAPARE - Administrator, Civil Service (Medical): No    Lack of Transportation (Non-Medical): No  Physical Activity: Insufficiently Active (02/14/2024)   Exercise Vital Sign    Days of Exercise per Week: 2 days    Minutes of Exercise per Session: 60 min  Stress: No Stress Concern Present (02/14/2024)   [employer] of Occupational Health - Occupational Stress Questionnaire    Feeling of Stress: Not at all  Social Connections: Moderately Integrated (02/14/2024)   Social Connection and Isolation Panel    Frequency of Communication with Friends  and Family: More than three times a week    Frequency of Social Gatherings with Friends and Family: Three times a week    Attends Religious Services: More than 4 times per year    Active Member of Clubs or Organizations: No    Attends Banker Meetings: Not on file    Marital Status: Married  Intimate Partner Violence: Not At Risk (08/31/2023)   Humiliation, Afraid, Rape, and Kick questionnaire    Fear of Current or Ex-Partner: No    Emotionally Abused: No     Physically Abused: No    Sexually Abused: No    Review of Systems 13 point review of systems per patient health survey noted.  Negative other than as indicated above or in HPI.    Objective:   Vitals:   02/14/24 1017  BP: 116/62  Pulse: (!) 59  Resp: 12  Temp: 97.9 F (36.6 C)  TempSrc: Temporal  SpO2: 96%  Weight: 129 lb 12.8 oz (58.9 kg)  Height: 5' (1.524 m)     Physical Exam Constitutional:      Appearance: She is well-developed.  HENT:     Head: Normocephalic and atraumatic.     Comments: No cords palpated and nontender over the right temple.  Pain-free jaw opening closing, and lateral deviation, TMJ nontender on right.  No soft tissue swelling.    Right Ear: External ear normal.     Left Ear: External ear normal.  Eyes:     Conjunctiva/sclera: Conjunctivae normal.     Pupils: Pupils are equal, round, and reactive to light.  Neck:     Thyroid : No thyromegaly.  Cardiovascular:     Rate and Rhythm: Normal rate and regular rhythm.     Heart sounds: Normal heart sounds. No murmur heard. Pulmonary:     Effort: Pulmonary effort is normal. No respiratory distress.     Breath sounds: Normal breath sounds. No wheezing.  Abdominal:     General: Bowel sounds are normal.     Palpations: Abdomen is soft.     Tenderness: There is no abdominal tenderness.  Musculoskeletal:        General: No tenderness. Normal range of motion.     Cervical back: Normal range of motion and neck supple.  Lymphadenopathy:     Cervical: No cervical adenopathy.  Skin:    General: Skin is warm and dry.     Findings: No rash.  Neurological:     Mental Status: She is alert and oriented to person, place, and time.  Psychiatric:        Behavior: Behavior normal.        Thought Content: Thought content normal.        Assessment & Plan:  MELLONIE GUESS is a 73 y.o. female . Annual physical exam  - -anticipatory guidance as below in AVS, screening labs above. Health maintenance items  as above in HPI discussed/recommended as applicable.   Type 2 diabetes mellitus with hyperglycemia, without long-term current use of insulin (HCC) - Plan: Urine Albumin/Creatinine with ratio (send out) [LAB689]  - Significantly improved with diet changes previously.  Check labs and adjust plan accordingly.  - We did discuss the afternoon symptoms of feeling fatigue, question relative hypoglycemia but her readings are not low enough to be clinically significant in the 70s or 80s.  Did recommend increase portion size lunch, afternoon snack to less than symptoms, and will follow-up in the next few weeks.  Other workup for  fatigue as below.  Need for influenza vaccination - Plan: Flu vaccine HIGH DOSE PF(Fluzone Trivalent)  Right temporal headache - Plan: Sedimentation rate  - Intermittent pressure right temple without vision symptoms.  Check sed rate but less likely temporal arteritis.  Question TMJ syndrome with referred discomfort.  Close monitoring, close follow-up, RTC precautions if worsening.  Fatigue, unspecified type - Plan: CBC, Comprehensive metabolic panel with GFR, TSH  - As above.  Noted in the afternoon, improves with meals.  Afternoon snack recommended, may need to increase portion sizes at lunch.  Question if she has cut back on calories too much for diabetes control as above.  Will check a CBC, TSH, CMP and follow-up in the next few weeks.  ER/RTC precautions.  Genital lesion, female - Plan: Ambulatory referral to Gynecology  - She would like to meet with GYN for exam, treatment.  Denies recent changes in lesion.  Pure hypercholesterolemia - Plan: Lipid panel  - Check lipids and adjust plan accordingly  Chronic insomnia Anxiety and depression - Plan: FLUoxetine  (PROZAC ) 20 MG capsule  - Mood symptoms stable with fluoxetine , will continue same dosing, still intermittent insomnia.  Meeting with therapist recommended given difficulty with various treatments for insomnia as above.   Alternate option would be to meet with psychiatry, no changes for now.  Essential hypertension - Plan: spironolactone  (ALDACTONE ) 50 MG tablet  - Stable current regimen, meds refilled, labs as above  Subacute cough Postnasal drip - Plan: fluticasone  (FLONASE ) 50 MCG/ACT nasal spray  - Recent flare, now back on Arnuity Ellipta , has albuterol  if needed, Flonase , and PPI, H2 blocker for reflux treatment.  RTC precautions.  Need for pneumococcal vaccination - Plan: Pneumococcal conjugate vaccine 20-valent (Prevnar 20)   Meds ordered this encounter  Medications   FLUoxetine  (PROZAC ) 20 MG capsule    Sig: Take 2 capsules (40 mg total) by mouth daily.    Dispense:  180 capsule    Refill:  1   fluticasone  (FLONASE ) 50 MCG/ACT nasal spray    Sig: Place 2 sprays into both nostrils daily.    Dispense:  16 g    Refill:  6   spironolactone  (ALDACTONE ) 50 MG tablet    Sig: Take 1 tablet (50 mg total) by mouth daily.    Dispense:  90 tablet    Refill:  1   Patient Instructions  Happy Birthday!  I will check an inflammation test for the temporal headache and if that is elevated we can discuss further workup.  As we discussed some of that could be due to intermittent discomfort from your temporomandibular joint.  Continue treatment from specialist for grinding of teeth.  Try having an afternoon snack to lessen risk of fatigue midday but I am not concerned with a blood sugar in the 70s or 80s.  You can also try to increase the portions at lunchtime to make sure you are still obtaining sufficient calories.  I know you have worked very hard to get the blood sugar down but want to make sure we are not missing out on sufficient calories.  I am going to check some additional blood work for that fatigue and would like to see you again in 2 weeks to see how things are going.  Be seen sooner if any new or worsening symptoms.  I did refer you to gynecology for establishing care and to evaluate the genital  lesion.  If any other lab concerns I will let you know but no other med  changes at this time.  Take care!  Preventive Care 85 Years and Older, Female Preventive care refers to lifestyle choices and visits with your health care provider that can promote health and wellness. Preventive care visits are also called wellness exams. What can I expect for my preventive care visit? Counseling Your health care provider may ask you questions about your: Medical history, including: Past medical problems. Family medical history. Pregnancy and menstrual history. History of falls. Current health, including: Memory and ability to understand (cognition). Emotional well-being. Home life and relationship well-being. Sexual activity and sexual health. Lifestyle, including: Alcohol, nicotine or tobacco, and drug use. Access to firearms. Diet, exercise, and sleep habits. Work and work Astronomer. Sunscreen use. Safety issues such as seatbelt and bike helmet use. Physical exam Your health care provider will check your: Height and weight. These may be used to calculate your BMI (body mass index). BMI is a measurement that tells if you are at a healthy weight. Waist circumference. This measures the distance around your waistline. This measurement also tells if you are at a healthy weight and may help predict your risk of certain diseases, such as type 2 diabetes and high blood pressure. Heart rate and blood pressure. Body temperature. Skin for abnormal spots. What immunizations do I need?  Vaccines are usually given at various ages, according to a schedule. Your health care provider will recommend vaccines for you based on your age, medical history, and lifestyle or other factors, such as travel or where you work. What tests do I need? Screening Your health care provider may recommend screening tests for certain conditions. This may include: Lipid and cholesterol levels. Hepatitis C test. Hepatitis  B test. HIV (human immunodeficiency virus) test. STI (sexually transmitted infection) testing, if you are at risk. Lung cancer screening. Colorectal cancer screening. Diabetes screening. This is done by checking your blood sugar (glucose) after you have not eaten for a while (fasting). Mammogram. Talk with your health care provider about how often you should have regular mammograms. BRCA-related cancer screening. This may be done if you have a family history of breast, ovarian, tubal, or peritoneal cancers. Bone density scan. This is done to screen for osteoporosis. Talk with your health care provider about your test results, treatment options, and if necessary, the need for more tests. Follow these instructions at home: Eating and drinking  Eat a diet that includes fresh fruits and vegetables, whole grains, lean protein, and low-fat dairy products. Limit your intake of foods with high amounts of sugar, saturated fats, and salt. Take vitamin and mineral supplements as recommended by your health care provider. Do not drink alcohol if your health care provider tells you not to drink. If you drink alcohol: Limit how much you have to 0-1 drink a day. Know how much alcohol is in your drink. In the [place] one drink equals one 12 oz bottle of beer (355 mL), one 5 oz glass of wine (148 mL), or one 1 oz glass of hard liquor (44 mL). Lifestyle Brush your teeth every morning and night with fluoride  toothpaste. Floss one time each day. Exercise for at least 30 minutes 5 or more days each week. Do not use any products that contain nicotine or tobacco. These products include cigarettes, chewing tobacco, and vaping devices, such as e-cigarettes. If you need help quitting, ask your health care provider. Do not use drugs. If you are sexually active, practice safe sex. Use a condom or other form of protection in order to  prevent STIs. Take aspirin only as told by your health care provider. Make sure that you  understand how much to take and what form to take. Work with your health care provider to find out whether it is safe and beneficial for you to take aspirin daily. Ask your health care provider if you need to take a cholesterol-lowering medicine (statin). Find healthy ways to manage stress, such as: Meditation, yoga, or listening to music. Journaling. Talking to a trusted person. Spending time with friends and family. Minimize exposure to UV radiation to reduce your risk of skin cancer. Safety Always wear your seat belt while driving or riding in a vehicle. Do not drive: If you have been drinking alcohol. Do not ride with someone who has been drinking. When you are tired or distracted. While texting. If you have been using any mind-altering substances or drugs. Wear a helmet and other protective equipment during sports activities. If you have firearms in your house, make sure you follow all gun safety procedures. What's next? Visit your health care provider once a year for an annual wellness visit. Ask your health care provider how often you should have your eyes and teeth checked. Stay up to date on all vaccines. This information is not intended to replace advice given to you by your health care provider. Make sure you discuss any questions you have with your health care provider. Document Revised: 10/23/2020 Document Reviewed: 10/23/2020 Elsevier Patient Education  2024 Elsevier Inc.    Signed,   Reyes Pines, MD [facility], [facility] 02/14/24 11:08 AM

## 2024-02-14 NOTE — Patient Instructions (Addendum)
 Happy Birthday!  I will check an inflammation test for the temporal headache and if that is elevated we can discuss further workup.  As we discussed some of that could be due to intermittent discomfort from your temporomandibular joint.  Continue treatment from specialist for grinding of teeth.  Try having an afternoon snack to lessen risk of fatigue midday but I am not concerned with a blood sugar in the 70s or 80s.  You can also try to increase the portions at lunchtime to make sure you are still obtaining sufficient calories.  I know you have worked very hard to get the blood sugar down but want to make sure we are not missing out on sufficient calories.  I am going to check some additional blood work for that fatigue and would like to see you again in 2 weeks to see how things are going.  Be seen sooner if any new or worsening symptoms.  I did refer you to gynecology for establishing care and to evaluate the genital lesion.  If any other lab concerns I will let you know but no other med changes at this time.  Take care!  Preventive Care 30 Years and Older, Female Preventive care refers to lifestyle choices and visits with your health care provider that can promote health and wellness. Preventive care visits are also called wellness exams. What can I expect for my preventive care visit? Counseling Your health care provider may ask you questions about your: Medical history, including: Past medical problems. Family medical history. Pregnancy and menstrual history. History of falls. Current health, including: Memory and ability to understand (cognition). Emotional well-being. Home life and relationship well-being. Sexual activity and sexual health. Lifestyle, including: Alcohol, nicotine or tobacco, and drug use. Access to firearms. Diet, exercise, and sleep habits. Work and work Astronomer. Sunscreen use. Safety issues such as seatbelt and bike helmet use. Physical exam Your health  care provider will check your: Height and weight. These may be used to calculate your BMI (body mass index). BMI is a measurement that tells if you are at a healthy weight. Waist circumference. This measures the distance around your waistline. This measurement also tells if you are at a healthy weight and may help predict your risk of certain diseases, such as type 2 diabetes and high blood pressure. Heart rate and blood pressure. Body temperature. Skin for abnormal spots. What immunizations do I need?  Vaccines are usually given at various ages, according to a schedule. Your health care provider will recommend vaccines for you based on your age, medical history, and lifestyle or other factors, such as travel or where you work. What tests do I need? Screening Your health care provider may recommend screening tests for certain conditions. This may include: Lipid and cholesterol levels. Hepatitis C test. Hepatitis B test. HIV (human immunodeficiency virus) test. STI (sexually transmitted infection) testing, if you are at risk. Lung cancer screening. Colorectal cancer screening. Diabetes screening. This is done by checking your blood sugar (glucose) after you have not eaten for a while (fasting). Mammogram. Talk with your health care provider about how often you should have regular mammograms. BRCA-related cancer screening. This may be done if you have a family history of breast, ovarian, tubal, or peritoneal cancers. Bone density scan. This is done to screen for osteoporosis. Talk with your health care provider about your test results, treatment options, and if necessary, the need for more tests. Follow these instructions at home: Eating and drinking  Eat a  diet that includes fresh fruits and vegetables, whole grains, lean protein, and low-fat dairy products. Limit your intake of foods with high amounts of sugar, saturated fats, and salt. Take vitamin and mineral supplements as recommended  by your health care provider. Do not drink alcohol if your health care provider tells you not to drink. If you drink alcohol: Limit how much you have to 0-1 drink a day. Know how much alcohol is in your drink. In the [place] one drink equals one 12 oz bottle of beer (355 mL), one 5 oz glass of wine (148 mL), or one 1 oz glass of hard liquor (44 mL). Lifestyle Brush your teeth every morning and night with fluoride  toothpaste. Floss one time each day. Exercise for at least 30 minutes 5 or more days each week. Do not use any products that contain nicotine or tobacco. These products include cigarettes, chewing tobacco, and vaping devices, such as e-cigarettes. If you need help quitting, ask your health care provider. Do not use drugs. If you are sexually active, practice safe sex. Use a condom or other form of protection in order to prevent STIs. Take aspirin only as told by your health care provider. Make sure that you understand how much to take and what form to take. Work with your health care provider to find out whether it is safe and beneficial for you to take aspirin daily. Ask your health care provider if you need to take a cholesterol-lowering medicine (statin). Find healthy ways to manage stress, such as: Meditation, yoga, or listening to music. Journaling. Talking to a trusted person. Spending time with friends and family. Minimize exposure to UV radiation to reduce your risk of skin cancer. Safety Always wear your seat belt while driving or riding in a vehicle. Do not drive: If you have been drinking alcohol. Do not ride with someone who has been drinking. When you are tired or distracted. While texting. If you have been using any mind-altering substances or drugs. Wear a helmet and other protective equipment during sports activities. If you have firearms in your house, make sure you follow all gun safety procedures. What's next? Visit your health care provider once a year for an  annual wellness visit. Ask your health care provider how often you should have your eyes and teeth checked. Stay up to date on all vaccines. This information is not intended to replace advice given to you by your health care provider. Make sure you discuss any questions you have with your health care provider. Document Revised: 10/23/2020 Document Reviewed: 10/23/2020 Elsevier Patient Education  2024 [employer].

## 2024-02-15 ENCOUNTER — Other Ambulatory Visit (INDEPENDENT_AMBULATORY_CARE_PROVIDER_SITE_OTHER)

## 2024-02-15 DIAGNOSIS — R5383 Other fatigue: Secondary | ICD-10-CM | POA: Diagnosis not present

## 2024-02-15 DIAGNOSIS — E7800 Pure hypercholesterolemia, unspecified: Secondary | ICD-10-CM

## 2024-02-15 LAB — COMPREHENSIVE METABOLIC PANEL WITH GFR
ALT: 16 U/L (ref 0–35)
AST: 21 U/L (ref 0–37)
Albumin: 4.5 g/dL (ref 3.5–5.2)
Alkaline Phosphatase: 76 U/L (ref 39–117)
BUN: 15 mg/dL (ref 6–23)
CO2: 24 meq/L (ref 19–32)
Calcium: 9.8 mg/dL (ref 8.4–10.5)
Chloride: 107 meq/L (ref 96–112)
Creatinine, Ser: 0.7 mg/dL (ref 0.40–1.20)
GFR: 85.45 mL/min (ref >60.00)
Glucose, Bld: 140 mg/dL — ABNORMAL HIGH (ref 70–99)
Potassium: 4.4 meq/L (ref 3.5–5.1)
Sodium: 139 meq/L (ref 135–145)
Total Bilirubin: 0.6 mg/dL (ref 0.2–1.2)
Total Protein: 7 g/dL (ref 6.0–8.3)

## 2024-02-15 LAB — LIPID PANEL
Cholesterol: 180 mg/dL (ref 0–200)
HDL: 51.7 mg/dL (ref >39.00)
LDL Cholesterol: 89 mg/dL (ref 0–99)
NonHDL: 127.85
Total CHOL/HDL Ratio: 3
Triglycerides: 195 mg/dL — ABNORMAL HIGH (ref 0.0–149.0)
VLDL: 39 mg/dL (ref 0.0–40.0)

## 2024-02-15 LAB — MICROALBUMIN / CREATININE URINE RATIO
Creatinine,U: 144.9 mg/dL
Microalb Creat Ratio: 5.7 mg/g (ref 0.0–30.0)
Microalb, Ur: 0.8 mg/dL (ref 0.0–1.9)

## 2024-02-15 LAB — TSH: TSH: 2.17 u[IU]/mL (ref 0.35–5.50)

## 2024-02-15 NOTE — Addendum Note (Signed)
 Addended by: Jozalyn Baglio K on: 02/15/2024 09:43 AM   Modules accepted: Orders

## 2024-02-19 ENCOUNTER — Ambulatory Visit: Payer: Self-pay | Admitting: Family Medicine

## 2024-02-21 ENCOUNTER — Other Ambulatory Visit: Payer: Self-pay | Admitting: Family Medicine

## 2024-02-21 DIAGNOSIS — K219 Gastro-esophageal reflux disease without esophagitis: Secondary | ICD-10-CM

## 2024-02-28 ENCOUNTER — Ambulatory Visit: Admitting: Family Medicine

## 2024-02-28 ENCOUNTER — Encounter: Payer: Self-pay | Admitting: Family Medicine

## 2024-02-28 VITALS — BP 140/70 | HR 70 | Temp 98.0°F | Resp 14 | Ht 60.0 in | Wt 134.5 lb

## 2024-02-28 DIAGNOSIS — H04202 Unspecified epiphora, left lacrimal gland: Secondary | ICD-10-CM | POA: Diagnosis not present

## 2024-02-28 DIAGNOSIS — H53142 Visual discomfort, left eye: Secondary | ICD-10-CM

## 2024-02-28 DIAGNOSIS — R5383 Other fatigue: Secondary | ICD-10-CM

## 2024-02-28 DIAGNOSIS — E1165 Type 2 diabetes mellitus with hyperglycemia: Secondary | ICD-10-CM | POA: Diagnosis not present

## 2024-02-28 NOTE — Progress Notes (Signed)
 Subjective:  Patient ID: Maria Sweeney, female    DOB: 11-20-1950  Age: 73 y.o. MRN: [id number]  CC:  Chief Complaint  Patient presents with   Fatigue    Certain times during the day she will get weak, tired and break out into a sweat.     HPI MIRANDA FRESE presents for   Fatigue, sweating Recent visit October 6 for her physical.  Occasional sleepy fatigue in the middle of the day with blood sugars in the 70s and 80s, noted that she felt better if she ate a snack, was eating 3 meals per day including lunch around 1 to 3 PM.  Postprandial fatigue a few hours after lunch but no syncope.  Denies chest pain or palpitations.  Intermittent headaches discussed at that time as well.  Possible component of TMJ syndrome with teeth grinding.  Was followed by specialist for her teeth grinding.  History of prediabetes, then A1c increased to diabetic level last year but significantly improved with her changes in diet and subsequent intentional weight loss. We discussed having an afternoon snack to lessen the risk of fatigue midday or increasing the portions at lunchtime to make sure she was obtaining sufficient calories.  Lab work was also obtained withnormal TSH at 2.17, glucose 140, remainder of CMP was normal.  CBC was normal on October 6, sed rate was normal,  A1c with minimal change at 6.5 (6.3 previously in April).   Since last visit - few similar episodes. Feels weak all over, breaks out into a sweat. Denies feeling anxious prior, no palpitations or perioral numbness during episodes. 3 total episodes. Unknown blood sugar at that time. Mid afternoon about 3 pm, and other was  prior to dinner. Lunch depends on time of wakening. BF each morning 9am - 10am. , lunch b/t 12-2, dinner 6:30-7.  No nocturnal symptoms, no morning symptoms.  Apple, banana or crackers in afternoon. Has not had episode on days when she has eaten a snack.  Would consider meeting with nutritionist.   Left eye watering.  Chronic  dry eye symptoms. Has to use drops/gel.  Followed by optho. Past 4 days the left eye has been bothering her more. Feels like it is scratched. Similar sx's in past. Light sensitive past 4 days. Watering. No vision changes. No contact lenses. Cataract surgery 3 yrs ago.   Optho Dr. Mittie at [facility].    History Patient Active Problem List   Diagnosis Date Noted   Postoperative pain 01/13/2024   Encounter for screening colonoscopy    Vitamin D  deficiency 08/02/2018   Prediabetes 08/02/2018   Carpal tunnel syndrome 02/10/2018   Tinnitus 06/25/2016   Numbness 04/13/2016   Loss of smell 04/13/2016   Pure hypercholesterolemia 11/30/2014   Degenerative disc disease, cervical 04/24/2014   Essential hypertension, benign 10/19/2013   Insomnia 10/19/2013   Herpes labialis 10/19/2013   Anxiety and depression 07/25/2012   GERD (gastroesophageal reflux disease) 07/25/2012   Past Medical History:  Diagnosis Date   Allergy    Anxiety    Arthritis    Phreesia 04/12/2020   Asthma    Cataract    Phreesia 10/25/2019   Cervical spondylosis    Complication of anesthesia    with colonoscopy woke up twice   Depression    Depression    Phreesia 04/12/2020   Diabetes mellitus without complication (HCC)    managed with diet and lifestyle   GERD (gastroesophageal reflux disease)    Glaucoma  Phreesia 10/25/2019   Hyperlipidemia    Hypertension    IBS (irritable bowel syndrome)    Constipation with diarrhea.   Oral herpes    Past Surgical History:  Procedure Laterality Date   ABDOMINAL HYSTERECTOMY  1978   DUB; ovaries intact.   APPENDECTOMY  1988   BREAST BIOPSY Bilateral 2009   Calcium  Deposit- 1980's and also in 2009/ Right side 1995   BREAST EXCISIONAL BIOPSY Bilateral    BREAST SURGERY N/A    Phreesia 10/25/2019   CARPAL TUNNEL RELEASE Left 01/2024   CATARACT EXTRACTION W/PHACO Left 10/23/2020   Procedure: CATARACT EXTRACTION PHACO AND INTRAOCULAR LENS PLACEMENT (IOC)  LEFT TORIC LENS KAHOOK DUAL BLADE GONIOTOMY,  VISION BLUE 7.69 00:53.0;  Surgeon: Mittie Gaskin, MD;  [facility];  Service: Ophthalmology;  Laterality: Left;   CATARACT EXTRACTION W/PHACO Right 11/20/2020   Procedure: CATARACT EXTRACTION PHACO AND INTRAOCULAR LENS PLACEMENT (IOC) RIGHT LENS KAHOOK DUAL BLADE GONIOTOMY VISION BLUE 8.58 01:24.9;  Surgeon: Mittie Gaskin, MD;  Location: [facility];  Service: Ophthalmology;  Laterality: Right;  latex   COLONOSCOPY  05/11/2009   Normal.  Previous colonoscopy with polyps. [facility].     COLONOSCOPY WITH PROPOFOL  N/A 12/20/2019   Procedure: COLONOSCOPY WITH PROPOFOL ;  Surgeon: Unk Corinn Skiff, MD;  Location: [facility];  Service: Gastroenterology;  Laterality: N/A;   DEBRIDEMENT TENNIS ELBOW     EYE SURGERY  2022   Cataracts   ROTATOR CUFF REPAIR     TONSILLECTOMY     UPPER GASTROINTESTINAL ENDOSCOPY     Allergies  Allergen Reactions   Amoxicillin Anaphylaxis   Valproic Acid Other (See Comments)   Augmentin [Amoxicillin-Pot Clavulanate]     Numbness around mouth, throat tightness   Clavulanic Acid    Codeine Itching   Depakote [Divalproex Sodium]     hives   Latex Hives   Lipitor [Atorvastatin]     Muscles pain   Penicillins     Throat tightness, mouth numb   Sulfamethoxazole    Trimethoprim    Zocor [Simvastatin]     Muscle pain   Septra [Sulfamethoxazole-Trimethoprim] Rash   Prior to Admission medications   Medication Sig Start Date End Date Taking? Authorizing Provider  albuterol  (PROVENTIL  HFA;VENTOLIN  HFA) 108 (90 Base) MCG/ACT inhaler Inhale 2 puffs into the lungs every 4 (four) hours as needed for wheezing or shortness of breath (cough, shortness of breath or wheezing.). 02/11/16  Yes Claudene Rayfield HERO, MD  ARNUITY ELLIPTA  100 MCG/ACT AEPB INHALE ONE PUFF INTO THE LUNGS DAILY AS NEEDED. 11/08/23  Yes Levora Reyes SAUNDERS, MD  dorzolamide-timolol  (COSOPT) 22.3-6.8 MG/ML ophthalmic solution   12/25/19  Yes [provider]  famotidine  (PEPCID ) 40 MG tablet TAKE ONE TABLET (40 MG TOTAL) BY MOUTH DAILY. 02/22/24  Yes Levora Reyes SAUNDERS, MD  FLUoxetine  (PROZAC ) 20 MG capsule Take 2 capsules (40 mg total) by mouth daily. 02/14/24  Yes Levora Reyes SAUNDERS, MD  fluticasone  (FLONASE ) 50 MCG/ACT nasal spray Place 2 sprays into both nostrils daily. 02/14/24  Yes Levora Reyes SAUNDERS, MD  latanoprost (XALATAN) 0.005 % ophthalmic solution 1 drop at bedtime.   Yes [provider]  Omega-3 Fatty Acids (FISH OIL OMEGA-3) 1000 MG CAPS Take by mouth.   Yes [provider]  ondansetron  (ZOFRAN -ODT) 4 MG disintegrating tablet Take 2 mg by mouth every 6 (six) hours as needed. 01/13/24  Yes [provider]  pantoprazole  (PROTONIX ) 40 MG tablet TAKE ONE TABLET BY MOUTH ONE TO TWO TIMES  DAILY BEFORE MEALS 10/14/23  Yes Armbruster, Elspeth SQUIBB, MD  rosuvastatin  (CRESTOR ) 10 MG tablet Take 1 tablet (10 mg total) by mouth daily. 08/13/23  Yes Levora Reyes SAUNDERS, MD  spironolactone  (ALDACTONE ) 50 MG tablet Take 1 tablet (50 mg total) by mouth daily. 02/14/24  Yes Levora Reyes SAUNDERS, MD  valACYclovir  (VALTREX ) 1000 MG tablet Take by mouth as needed.   Yes [provider]  VITAMIN D  PO Take by mouth.   Yes [provider]  methylcellulose (CITRUCEL) oral powder Take 1 packet by mouth daily. Patient not taking: Reported on 02/28/2024 03/20/22   Armbruster, Elspeth SQUIBB, MD   Social History   Socioeconomic History   Marital status: Married    Spouse name: Not on file   Number of children: 2   Years of education: Bachelors   Highest education level: Bachelor's degree (e.g., BA, AB, BS)  Occupational History   Occupation: Training and development officer: [employer]    Comment: also works at [employer].   Occupation: retired  Tobacco Use   Smoking status: Never   Smokeless tobacco: Never  Vaping Use   Vaping status: Never Used  Substance and Sexual Activity   Alcohol use: Never    Drug use: Never   Sexual activity: Yes    Partners: Male    Birth control/protection: Surgical, Post-menopausal  Other Topics Concern   Not on file  Social History Narrative   Marital status: divorced since 1999; dating seriously in 2019 since 2015.      Children: one son, one daughter; 2 grandchildren.      Lives: alone in house with 2 dogs     Employment:  Retired.  Needs to work in 2019.      Tobacco: none      Alcohol: rare      Drugs: none      Exercise:  Sporadic   Right-handed.   Occasional caffeine use.      ADLs: independent with ADLs; drives; no assistant devices.      Advanced Directives: none; FULL CODE.   Social Drivers of Corporate investment banker Strain: Low Risk  (02/14/2024)   Overall Financial Resource Strain (CARDIA)    Difficulty of Paying Living Expenses: Not hard at all  Food Insecurity: No Food Insecurity (02/14/2024)   Hunger Vital Sign    Worried About Running Out of Food in the Last Year: Never true    Ran Out of Food in the Last Year: Never true  Transportation Needs: No Transportation Needs (02/14/2024)   PRAPARE - Administrator, Civil Service (Medical): No    Lack of Transportation (Non-Medical): No  Physical Activity: Insufficiently Active (02/14/2024)   Exercise Vital Sign    Days of Exercise per Week: 2 days    Minutes of Exercise per Session: 60 min  Stress: No Stress Concern Present (02/14/2024)   [employer] of Occupational Health - Occupational Stress Questionnaire    Feeling of Stress: Not at all  Social Connections: Moderately Integrated (02/14/2024)   Social Connection and Isolation Panel    Frequency of Communication with Friends and Family: More than three times a week    Frequency of Social Gatherings with Friends and Family: Three times a week    Attends Religious Services: More than 4 times per year    Active Member of Clubs or Organizations: No    Attends Banker Meetings: Not on file    Marital  Status: Married  Intimate Partner Violence: Not At Risk (08/31/2023)   Humiliation, Afraid, Rape, and Kick questionnaire    Fear of Current or Ex-Partner: No    Emotionally Abused: No    Physically Abused: No    Sexually Abused: No    Review of Systems Per HPI.   Objective:   Vitals:   02/28/24 1106  BP: (!) 144/62  Pulse: 70  Resp: 14  Temp: 98 F (36.7 C)  TempSrc: Temporal  SpO2: 97%  Weight: 134 lb 8 oz (61 kg)  Height: 5' (1.524 m)     Physical Exam Vitals reviewed.  Constitutional:      Appearance: Normal appearance. She is well-developed.  HENT:     Head: Normocephalic and atraumatic.  Eyes:     Extraocular Movements: Extraocular movements intact.     Pupils: Pupils are equal, round, and reactive to light.     Comments: Clear watering of left eye, no exudate at canthi.  Injected sclera.  Neck:     Vascular: No carotid bruit.  Cardiovascular:     Rate and Rhythm: Normal rate and regular rhythm.     Heart sounds: Normal heart sounds.  Pulmonary:     Effort: Pulmonary effort is normal.     Breath sounds: Normal breath sounds.  Abdominal:     Palpations: Abdomen is soft. There is no pulsatile mass.     Tenderness: There is no abdominal tenderness.  Musculoskeletal:     Right lower leg: No edema.     Left lower leg: No edema.  Skin:    General: Skin is warm and dry.  Neurological:     Mental Status: She is alert and oriented to person, place, and time.  Psychiatric:        Mood and Affect: Mood normal.        Behavior: Behavior normal.    Risks (including but not limited to bleeding and infection, injury to eye), benefits, and alternatives discussed for fluorescein application, eye exam. Verbal consent obtained after any questions were answered.  Anterior chamber clear,  tetracaine  0.5% 1 gtts applied.  Lids everted, no foreign body identified. Fluorescein applied, no uptake.  Flushed with saline, no complications. Vision Screening   Right eye Left  eye Both eyes  Without correction 20/25 20/50 20/25-1  With correction     She reports chronic decreased visual acuity in left eye compared to right eye.   Assessment & Plan:  TANARA TURVEY is a 73 y.o. female . Type 2 diabetes mellitus with hyperglycemia, without long-term current use of insulin (HCC) - Plan: Amb ref to Medical Nutrition Therapy-MNT Fatigue, unspecified type - Plan: Amb ref to Medical Nutrition Therapy-MNT  - Few intermittent episodes, similar time each day between her lunch and dinner.  She does note that her breakfast and dinner or to her back with meals during the day.  She certainly could have a relative calorie deficit during the middle of the day when she has these episodes as she does not member have any snack during the few times that has occurred since last visit.  Low glycemic index foods discussed, and increased portions of lunch, snack midday discussed, but will also refer her to nutritionist.  Previous labs were overall reassuring, hold on new labs at this time.  Recheck 6 weeks with RTC precautions sooner if needed.  Watering of left eye Photophobia, left eye  - 4-day history, concerning for possible iritis or keratoconjunctivitis.  No sign of corneal abrasion on  fluorescein application in office.  Advised her to call her ophthalmologist today to be seen acutely.   No orders of the defined types were placed in this encounter.  Patient Instructions  I suspect your symptoms are still related to a relative lower blood sugar or not enough calories in the middle of the day.  Try increasing your portion sizes at lunch, or at the minimum a snack every day in between lunch and dinner.  I did refer you to nutritionist as well to talk about other dietary recommendations.  Low glycemic index foods can be helpful as well to spread out the calories further.  If any new or worsening symptoms please be seen.  I do not see any sign of uptake from the dye today to suggest an  abrasion of the cornea.  With the eye watering and the difficulty is bright light on that eye, you could have a condition called iritis or other eye issue that would be best treated by your eye specialist.  Please give them a call after leaving our office and let them know your symptoms.  Hopefully they will be able to see you today or tomorrow at the latest.     Signed,   Reyes Pines, MD [facility], [facility] 02/28/24 12:03 PM

## 2024-02-28 NOTE — Progress Notes (Signed)
 Lab results have been discussed.   Verbalized understanding? Yes  Are there any questions? No

## 2024-02-28 NOTE — Patient Instructions (Addendum)
 I suspect your symptoms are still related to a relative lower blood sugar or not enough calories in the middle of the day.  Try increasing your portion sizes at lunch, or at the minimum a snack every day in between lunch and dinner.  I did refer you to nutritionist as well to talk about other dietary recommendations.  Low glycemic index foods can be helpful as well to spread out the calories further.  If any new or worsening symptoms please be seen.  I do not see any sign of uptake from the dye today to suggest an abrasion of the cornea.  With the eye watering and the difficulty is bright light on that eye, you could have a condition called iritis or other eye issue that would be best treated by your eye specialist.  Please give them a call after leaving our office and let them know your symptoms.  Hopefully they will be able to see you today or tomorrow at the latest.

## 2024-03-03 ENCOUNTER — Other Ambulatory Visit: Payer: Self-pay | Admitting: Family Medicine

## 2024-03-03 DIAGNOSIS — Z1231 Encounter for screening mammogram for malignant neoplasm of breast: Secondary | ICD-10-CM

## 2024-03-24 NOTE — Progress Notes (Signed)
 Diabetes Self-Management Education  Visit Type: First/Initial  Appt. Start Time: 0945 Appt. End Time: 1047  03/27/2024  [name], identified by name and date of birth, is a 73 y.o. female with a diagnosis of Diabetes: Type 2.   ASSESSMENT  Patient is here today with her husband Maria Sweeney. Patient would like to learn how to meal plan and weight management. Patient lives with with her husband.  Pt reports shared shopping and Pt does the cooking. Pt reports eating out one time weekly. Pt reports making the following changes including cutting out sugar by limiting sweet and no adding sugar. Pt reports she enjoys walking and has a photographer. Pt reports physical activity is a challenge stating lack of motivation.  Pt reports typical intake of two meals with 2 snacks. Pt reports she is lactose intolerant and can accept cheese and yogurt. Pt reports this morning 137 mg/dL.  All Pt's questions were answered during this encounter.   History includes:   Past Medical History:  Diagnosis Date   Allergy    Anxiety    Arthritis    [employer]reesia 04/12/2020   Asthma    Cataract    [employer]reesia 10/25/2019   Cervical spondylosis    Complication of anesthesia    with colonoscopy woke up twice   Depression    Depression    [employer]reesia 04/12/2020   Diabetes mellitus without complication (HCC)    managed with diet and lifestyle   GERD (gastroesophageal reflux disease)    Glaucoma    [employer]reesia 10/25/2019   Hyperlipidemia    Hypertension    IBS (irritable bowel syndrome)    Constipation with diarrhea.   Oral herpes     Medications include:  Current Outpatient Medications:    albuterol  (PROVENTIL  HFA;VENTOLIN  HFA) 108 (90 Base) MCG/ACT inhaler, Inhale 2 puffs into the lungs every 4 (four) hours as needed for wheezing or shortness of breath (cough, shortness of breath or wheezing.)., Disp: 1 Inhaler, Rfl: 1   ARNUITY ELLIPTA  100 MCG/ACT AEPB, INHALE ONE PUFF INTO THE LUNGS DAILY AS NEEDED., Disp:  30 each, Rfl: 2   dorzolamide-timolol  (COSOPT) 22.3-6.8 MG/ML ophthalmic solution, , Disp: , Rfl:    famotidine  (PEPCID ) 40 MG tablet, TAKE ONE TABLET (40 MG TOTAL) BY MOUTH DAILY., Disp: 90 tablet, Rfl: 1   FLUoxetine  (PROZAC ) 20 MG capsule, Take 2 capsules (40 mg total) by mouth daily., Disp: 180 capsule, Rfl: 1   fluticasone  (FLONASE ) 50 MCG/ACT nasal spray, Place 2 sprays into both nostrils daily., Disp: 16 g, Rfl: 6   latanoprost (XALATAN) 0.005 % ophthalmic solution, 1 drop at bedtime., Disp: , Rfl:    methylcellulose (CITRUCEL) oral powder, Take 1 packet by mouth daily. (Patient not taking: Reported on 02/28/2024), Disp: , Rfl:    Omega-3 Fatty Acids (FISH OIL OMEGA-3) 1000 MG CAPS, Take by mouth., Disp: , Rfl:    ondansetron  (ZOFRAN -ODT) 4 MG disintegrating tablet, Take 2 mg by mouth every 6 (six) hours as needed., Disp: , Rfl:    pantoprazole  (PROTONIX ) 40 MG tablet, TAKE ONE TABLET BY MOUTH ONE TO TWO TIMES DAILY BEFORE MEALS, Disp: 180 tablet, Rfl: 1   rosuvastatin  (CRESTOR ) 10 MG tablet, Take 1 tablet (10 mg total) by mouth daily., Disp: 90 tablet, Rfl: 3   spironolactone  (ALDACTONE ) 50 MG tablet, Take 1 tablet (50 mg total) by mouth daily., Disp: 90 tablet, Rfl: 1   valACYclovir  (VALTREX ) 1000 MG tablet, Take by mouth as needed., Disp: , Rfl:    VITAMIN  D PO, Take by mouth., Disp: , Rfl:     Fluoxetine  40 mg 1 x day, pantoprazole  40 mg 2 x day, rosuvastatin  10 mg 1xday, spironolactone  50 mg 1xday, famotidine  40 mg  1 x day, vitamin D  10000 iu x day,  1 fish oil 1000 mg, 300 mg omega-3 Labs noted:   Lab Results  Component Value Date   HGBA1C 6.5 02/14/2024   Lab Results  Component Value Date   CHOL 180 02/15/2024   HDL 51.70 02/15/2024   LDLCALC 89 02/15/2024   LDLDIRECT 99.0 09/12/2021   TRIG 195.0 (H) 02/15/2024   CHOLHDL 3 02/15/2024   Last vitamin D  Lab Results  Component Value Date   VD25OH 42.9 06/13/2020   Wt Readings from Last 3 Encounters:  02/28/24 134 lb 8 oz  (61 kg)  02/14/24 129 lb 12.8 oz (58.9 kg)  12/06/23 130 lb (59 kg)    Diabetes Self-Management Education - 03/27/24 0952       Visit Information   Visit Type First/Initial      Initial Visit   Diabetes Type Type 2    Date Diagnosed 2024    Are you currently following a meal plan? No    Are you taking your medications as prescribed? Not on Medications      Health Coping   How would you rate your overall health? Good      Psychosocial Assessment   Patient Belief/Attitude about Diabetes Motivated to manage diabetes    What is the hardest part about your diabetes right now, causing you the most concern, or is the most worrisome to you about your diabetes?   Being active;Making healty food and beverage choices    Self-care barriers None    Self-management support Doctor's office    Other persons present Patient;Spouse/SO    Patient Concerns Nutrition/Meal planning;Weight Control;Problem Solving    Special Needs None    Preferred Learning Style No preference indicated    Learning Readiness Change in progress    How often do you need to have someone help you when you read instructions, pamphlets, or other written materials from your doctor or pharmacy? 1 - Never    What is the last grade level you completed in school? BA accounting & business administration      Pre-Education Assessment   Patient understands the diabetes disease and treatment process. Needs Instruction    Patient understands incorporating nutritional management into lifestyle. Needs Instruction    Patient undertands incorporating physical activity into lifestyle. Comprehends key points    Patient understands using medications safely. Needs Instruction    Patient understands monitoring blood glucose, interpreting and using results Needs Review    Patient understands prevention, detection, and treatment of acute complications. Needs Instruction    Patient understands prevention, detection, and treatment of chronic  complications. Needs Review    Patient understands how to develop strategies to address psychosocial issues. Needs Instruction    Patient understands how to develop strategies to promote health/change behavior. Needs Instruction      Complications   Last HgB A1C per patient/outside source 6.5 %    How often do you check your blood sugar? --   once monthly   Fasting Blood glucose range (mg/dL) 869-820    Have you had a dilated eye exam in the past 12 months? Yes    Have you had a dental exam in the past 12 months? Yes    Are you checking your feet? No  Pt instructed to check daily     Dietary Intake   Breakfast berries, greek plain yogurt, 1 slices of raisin bread or wheat or egg, wheat toast, avocado, coffee plain    Snack (morning) skips    Lunch skips    Snack (afternoon) 3-4 pm: apple or orange or banaa or nabs    Dinner 6-7pm: steak, baked potato, salad with dressing or cheese toast, soup or meat loaf, green beans, potatoes or cabbage, pintos, corn bread, salmon patties, water    Snack (evening) 9-10 pm: apple, popcorn, veggie straws    Beverage(s) water, coffee plain bottled diet green tea      Activity / Exercise   Activity / Exercise Type Light (walking / raking leaves)    How many days per week do you exercise? 3    How many minutes per day do you exercise? 60    Total minutes per week of exercise 180      Patient Education   Previous Diabetes Education No    Disease Pathophysiology Definition of diabetes, type 1 and 2, and the diagnosis of diabetes    Healthy Eating Food label reading, portion sizes and measuring food.;Plate Method;Reviewed blood glucose goals for pre and post meals and how to evaluate the patients' food intake on their blood glucose level.;Carbohydrate counting;Role of diet in the treatment of diabetes and the relationship between the three main macronutrients and blood glucose level    Being Active Role of exercise on diabetes management, blood pressure  control and cardiac health.    Medications --   n/a   Monitoring Identified appropriate SMBG and/or A1C goals.;Daily foot exams    Acute complications Taught prevention, symptoms, and  treatment of hypoglycemia - the 15 rule.    Chronic complications Relationship between chronic complications and blood glucose control    Diabetes Stress and Support Identified and addressed patients feelings and concerns about diabetes    Preconception care --   n/a   Lifestyle and Health Coping Lifestyle issues that need to be addressed for better diabetes care      Individualized Goals (developed by patient)   Nutrition Follow meal plan discussed    Physical Activity Exercise 3-5 times per week;60 minutes per day    Medications Not Applicable    Monitoring  Test my blood glucose as discussed    Problem Solving Eating Pattern;Addressing barriers to behavior change    Reducing Risk examine blood glucose patterns;do foot checks daily    Health Coping Ask for help with psychological, social, or emotional issues      Post-Education Assessment   Patient understands the diabetes disease and treatment process. Comprehends key points    Patient understands incorporating nutritional management into lifestyle. Comprehends key points    Patient undertands incorporating physical activity into lifestyle. Comprehends key points    Patient understands using medications safely. Needs Instruction    Patient understands monitoring blood glucose, interpreting and using results Comprehends key points    Patient understands prevention, detection, and treatment of acute complications. Comprehends key points    Patient understands prevention, detection, and treatment of chronic complications. Comprehends key points    Patient understands how to develop strategies to address psychosocial issues. Comprehends key points    Patient understands how to develop strategies to promote health/change behavior. Comprehends key points       Outcomes   Expected Outcomes Demonstrated interest in learning. Expect positive outcomes    Future DMSE 3-4 months  Program Status Not Completed          Individualized Plan for Diabetes Self-Management Training:   Learning Objective:  Patient will have a greater understanding of diabetes self-management. Patient education plan is to attend individual and/or group sessions per assessed needs and concerns.   Plan:   Patient Instructions  Increase physical activity by visiting the gym 3/d/w for 30-60 minutes Monday, Wednesday, Fridays around 11 am   Expected Outcomes:  Demonstrated interest in learning. Expect positive outcomes  Education material provided: ADA - How to Thrive: A Guide for Your Journey with Diabetes, Meal plan card, My Plate, Snack sheet, and Diabetes Resources  If problems or questions, patient to contact team via:  Phone  Future DSME appointment: 3-4 months

## 2024-03-27 ENCOUNTER — Encounter: Attending: Family Medicine | Admitting: Dietician

## 2024-03-27 DIAGNOSIS — E1165 Type 2 diabetes mellitus with hyperglycemia: Secondary | ICD-10-CM | POA: Diagnosis not present

## 2024-03-27 DIAGNOSIS — R5383 Other fatigue: Secondary | ICD-10-CM | POA: Insufficient documentation

## 2024-03-27 DIAGNOSIS — Z713 Dietary counseling and surveillance: Secondary | ICD-10-CM | POA: Insufficient documentation

## 2024-03-27 NOTE — Patient Instructions (Addendum)
 Increase physical activity by visiting the gym 3/d/w for 30-60 minutes Monday, Wednesday, Fridays around 11 am

## 2024-04-05 ENCOUNTER — Other Ambulatory Visit: Payer: Self-pay | Admitting: Family Medicine

## 2024-04-05 ENCOUNTER — Encounter: Payer: Self-pay | Admitting: Family Medicine

## 2024-04-05 ENCOUNTER — Ambulatory Visit
Admission: RE | Admit: 2024-04-05 | Discharge: 2024-04-05 | Disposition: A | Discharge status: Home / Self Care | Source: Ambulatory Visit | Attending: Family Medicine | Admitting: Family Medicine

## 2024-04-05 DIAGNOSIS — Z1231 Encounter for screening mammogram for malignant neoplasm of breast: Secondary | ICD-10-CM | POA: Insufficient documentation

## 2024-04-05 DIAGNOSIS — N6311 Unspecified lump in the right breast, upper outer quadrant: Secondary | ICD-10-CM

## 2024-04-14 ENCOUNTER — Ambulatory Visit: Admitting: Family Medicine

## 2024-04-17 ENCOUNTER — Inpatient Hospital Stay: Admission: RE | Admit: 2024-04-17 | Discharge: 2024-04-17 | Attending: Family Medicine | Admitting: Family Medicine

## 2024-04-17 DIAGNOSIS — N6311 Unspecified lump in the right breast, upper outer quadrant: Secondary | ICD-10-CM | POA: Diagnosis not present

## 2024-04-17 DIAGNOSIS — R92323 Mammographic fibroglandular density, bilateral breasts: Secondary | ICD-10-CM | POA: Diagnosis not present

## 2024-04-17 DIAGNOSIS — N644 Mastodynia: Secondary | ICD-10-CM | POA: Diagnosis not present

## 2024-04-21 ENCOUNTER — Ambulatory Visit: Attending: Cardiovascular Disease | Admitting: Cardiovascular Disease

## 2024-04-21 ENCOUNTER — Encounter: Payer: Self-pay | Admitting: Cardiovascular Disease

## 2024-04-21 VITALS — BP 120/70 | HR 53 | Ht 60.0 in | Wt 133.0 lb

## 2024-04-21 DIAGNOSIS — I10 Essential (primary) hypertension: Secondary | ICD-10-CM | POA: Diagnosis not present

## 2024-04-21 DIAGNOSIS — R7303 Prediabetes: Secondary | ICD-10-CM | POA: Diagnosis not present

## 2024-04-21 DIAGNOSIS — R0609 Other forms of dyspnea: Secondary | ICD-10-CM | POA: Diagnosis not present

## 2024-04-21 DIAGNOSIS — E7800 Pure hypercholesterolemia, unspecified: Secondary | ICD-10-CM | POA: Diagnosis not present

## 2024-04-21 NOTE — Progress Notes (Signed)
 Maria Sweeney                                          MRN: [id number]   04/21/2024   The VBCI Quality Team Specialist reviewed this patient medical record for the purposes of chart review for care gap closure. The following were reviewed: chart review for care gap closure-controlling blood pressure.    VBCI Quality Team

## 2024-04-21 NOTE — Patient Instructions (Addendum)
 Check for AliveCor (kardiamobile device)  Medication Instructions:  No changes  If you need a refill on your cardiac medications before your next appointment, please call your pharmacy.   Lab work: No new labs needed  Testing/Procedures: No new testing needed  Follow-Up: At [facility], you and your health needs are our priority.  As part of our continuing mission to provide you with exceptional heart care, we have created designated Provider Care Teams.  These Care Teams include your primary Cardiologist (physician) and Advanced Practice Providers (APPs -  Physician Assistants and Nurse Practitioners) who all work together to provide you with the care you need, when you need it.  You will need a follow up appointment in 12 months  Providers on your designated Care Team:   Lonni Meager, NP Bernardino Bring, PA-C Cadence [place], [place]  COVID-19 Vaccine Information can be found at: [email or web address] For questions related to vaccine distribution or appointments, please email [email or web address] or call [phone].

## 2024-04-21 NOTE — Progress Notes (Addendum)
 Cardiology Office Note  Date:  04/21/2024   ID:  Maria Sweeney, DOB 02-Jul-1950, MRN [id number]  PCP:  Levora Reyes SAUNDERS, MD   Chief Complaint  Patient presents with   12 month follow up     Patient c/o more frequent palpitations lately.     HPI:  [name] is a 73 yo woman with PMH of HTN  Hyperlipidemia,  intolerance to simvastatin/liptor Anxiety/depression Palpitations Who presents for follow-up of her  progressive DOE, HTN, palpitations  Last seen in clinic 6/24 On prior clinic visit reported appreciating palpitations  On visit today, reports periodic palpitations, can last 30 minutes, fluttering in her chest  Blood pressure well-controlled Reports moderating her diet, A1c improved  Active Non-smoker Tolerating Crestor  10 mg  Lab work reviewed,  A1c 6.5 Total cholesterol 180, LDl 89  EKG personally reviewed by myself on todays visit EKG Interpretation Date/Time:  Friday April 21 2024 10:41:51 EST Ventricular Rate:  53 PR Interval:  142 QRS Duration:  80 QT Interval:  424 QTC Calculation: 397 R Axis:   1  Text Interpretation: Sinus bradycardia When compared with ECG of 07-Mar-2020 21:34, No significant change was found Confirmed by Perla Lye [phone]) on 04/21/2024 11:15:33 AM   Previously an accountant  family history Mother: smoker, CAD, MI   PMH:   has a past medical history of Allergy, Anxiety, Arthritis, Asthma, Cataract, Cervical spondylosis, Complication of anesthesia, Depression, Depression, Diabetes mellitus without complication (HCC), GERD (gastroesophageal reflux disease), Glaucoma, Hyperlipidemia, Hypertension, IBS (irritable bowel syndrome), and Oral herpes.  PSH:    Past Surgical History:  Procedure Laterality Date   ABDOMINAL HYSTERECTOMY  1978   DUB; ovaries intact.   APPENDECTOMY  1988   BREAST BIOPSY Bilateral 2009   Calcium  Deposit- 1980's and also in 2009/ Right side 1995   BREAST EXCISIONAL BIOPSY Bilateral     BREAST SURGERY N/A    Phreesia 10/25/2019   CARPAL TUNNEL RELEASE Left 01/2024   CATARACT EXTRACTION W/PHACO Left 10/23/2020   Procedure: CATARACT EXTRACTION PHACO AND INTRAOCULAR LENS PLACEMENT (IOC) LEFT TORIC LENS KAHOOK DUAL BLADE GONIOTOMY,  VISION BLUE 7.69 00:53.0;  Surgeon: Mittie Gaskin, MD;  Location: [facility];  Service: Ophthalmology;  Laterality: Left;   CATARACT EXTRACTION W/PHACO Right 11/20/2020   Procedure: CATARACT EXTRACTION PHACO AND INTRAOCULAR LENS PLACEMENT (IOC) RIGHT LENS KAHOOK DUAL BLADE GONIOTOMY VISION BLUE 8.58 01:24.9;  Surgeon: Mittie Gaskin, MD;  Location: [facility];  Service: Ophthalmology;  Laterality: Right;  latex   COLONOSCOPY  05/11/2009   Normal.  Previous colonoscopy with polyps. [place].     COLONOSCOPY WITH PROPOFOL  N/A 12/20/2019   Procedure: COLONOSCOPY WITH PROPOFOL ;  Surgeon: Unk Corinn Skiff, MD;  Location: [facility];  Service: Gastroenterology;  Laterality: N/A;   DEBRIDEMENT TENNIS ELBOW     EYE SURGERY  2022   Cataracts   ROTATOR CUFF REPAIR     TONSILLECTOMY     UPPER GASTROINTESTINAL ENDOSCOPY      Current Outpatient Medications  Medication Sig Dispense Refill   albuterol  (PROVENTIL  HFA;VENTOLIN  HFA) 108 (90 Base) MCG/ACT inhaler Inhale 2 puffs into the lungs every 4 (four) hours as needed for wheezing or shortness of breath (cough, shortness of breath or wheezing.). 1 Inhaler 1   ARNUITY ELLIPTA  100 MCG/ACT AEPB INHALE ONE PUFF INTO THE LUNGS DAILY AS NEEDED. 30 each 2   dorzolamide-timolol  (COSOPT) 22.3-6.8 MG/ML ophthalmic solution      famotidine  (PEPCID ) 40 MG tablet TAKE ONE TABLET (40 MG  TOTAL) BY MOUTH DAILY. 90 tablet 1   FLUoxetine  (PROZAC ) 20 MG capsule Take 2 capsules (40 mg total) by mouth daily. 180 capsule 1   fluticasone  (FLONASE ) 50 MCG/ACT nasal spray Place 2 sprays into both nostrils daily. 16 g 6   latanoprost (XALATAN) 0.005 % ophthalmic solution 1 drop at bedtime.      Omega-3 Fatty Acids (FISH OIL OMEGA-3) 1000 MG CAPS Take by mouth.     ondansetron  (ZOFRAN -ODT) 4 MG disintegrating tablet Take 2 mg by mouth every 6 (six) hours as needed.     pantoprazole  (PROTONIX ) 40 MG tablet TAKE ONE TABLET BY MOUTH ONE TO TWO TIMES DAILY BEFORE MEALS 180 tablet 1   rosuvastatin  (CRESTOR ) 10 MG tablet Take 1 tablet (10 mg total) by mouth daily. 90 tablet 3   spironolactone  (ALDACTONE ) 50 MG tablet Take 1 tablet (50 mg total) by mouth daily. 90 tablet 1   valACYclovir  (VALTREX ) 1000 MG tablet Take by mouth as needed.     VITAMIN D  PO Take by mouth.     methylcellulose (CITRUCEL) oral powder Take 1 packet by mouth daily. (Patient not taking: Reported on 04/21/2024)     No current facility-administered medications for this visit.    Allergies:   Amoxicillin, Valproic acid, Augmentin [amoxicillin-pot clavulanate], Clavulanic acid, Codeine, Depakote [divalproex sodium], Latex, Lipitor [atorvastatin], Penicillins, Sulfamethoxazole, Trimethoprim, Zocor [simvastatin], and Septra [sulfamethoxazole-trimethoprim]   Social History:  The patient  reports that she has never smoked. She has never used smokeless tobacco. She reports that she does not drink alcohol and does not use drugs.   Family History:   family history includes Alcohol abuse in her father; Asthma in her father; Breast cancer in her paternal grandmother; COPD in her father; Cancer in her mother; Colon cancer in her maternal aunt; Diabetes in her mother; Emphysema in her father; Glaucoma in her father and mother; Heart disease in her mother; Hyperlipidemia in her mother; Hypertension in her mother and sister; Stroke (age of onset: 11) in her mother.    Review of Systems: Review of Systems  Constitutional: Negative.   HENT: Negative.    Respiratory: Negative.    Cardiovascular: Negative.   Gastrointestinal: Negative.   Musculoskeletal: Negative.   Neurological: Negative.   Psychiatric/Behavioral: Negative.    All  other systems reviewed and are negative.   PHYSICAL EXAM: VS:  BP 120/70 (BP Location: Left Arm, Patient Position: Sitting, Cuff Size: Normal)   Pulse (!) 53   Ht 5' (1.524 m)   Wt 133 lb (60.3 kg)   SpO2 98%   BMI 25.97 kg/m  , BMI Body mass index is 25.97 kg/m. Constitutional:  oriented to person, place, and time. No distress.  HENT:  Head: Grossly normal Eyes:  no discharge. No scleral icterus.  Neck: No JVD, no carotid bruits  Cardiovascular: Regular rate and rhythm, no murmurs appreciated Pulmonary/Chest: Clear to auscultation bilaterally, no wheezes or rails Abdominal: Soft.  no distension.  no tenderness.  Musculoskeletal: Normal range of motion Neurological:  normal muscle tone. Coordination normal. No atrophy Skin: Skin warm and dry Psychiatric: normal affect, pleasant  Recent Labs: 02/14/2024: Hemoglobin 14.7; Platelets 262.0 02/15/2024: ALT 16; BUN 15; Creatinine, Ser 0.70; Potassium 4.4; Sodium 139; TSH 2.17    Lipid Panel Lab Results  Component Value Date   CHOL 180 02/15/2024   HDL 51.70 02/15/2024   LDLCALC 89 02/15/2024   TRIG 195.0 (H) 02/15/2024    Wt Readings from Last 3 Encounters:  04/21/24 133 lb (  60.3 kg)  02/28/24 134 lb 8 oz (61 kg)  02/14/24 129 lb 12.8 oz (58.9 kg)     ASSESSMENT AND PLAN:  Problem List Items Addressed This Visit       Cardiology Problems   Pure hypercholesterolemia   Essential hypertension, benign   Relevant Orders   EKG 12-Lead (Completed)     Other   Prediabetes   Other Visit Diagnoses       DOE (dyspnea on exertion)    -  Primary   Relevant Orders   EKG 12-Lead (Completed)      Shortness of breath Stable symptoms, recommend exercise program No indication of CHF  Anxiety Managed by primary care, stable  Hyperlipidemia Tolerating Crestor  10 mg daily Reasonable cholesterol numbers  Hypertension Blood pressure is well controlled on today's visit. No changes made to the medications.  Diabetes  type II with no complications Diet controlled, through aggressive diet modification A1c improved  Palpitations Zio monitor offered, given sporadic nature she prefers Kardia mobile device for home monitoring - Recommend she send concerning EKG strips from [place] mobile device through MyChart system for our review - Unable to add beta-blockers given baseline bradycardia  Signed, Velinda Lunger, M.D., Ph.D. [facility] [place], [place] [phone]

## 2024-05-02 NOTE — Progress Notes (Signed)
 MAGALIE ALMON                                          MRN: [id number]   05/02/2024   The VBCI Quality Team Specialist reviewed this patient medical record for the purposes of chart review for care gap closure. The following were reviewed: abstraction for care gap closure-controlling blood pressure.    VBCI Quality Team

## 2024-06-01 ENCOUNTER — Encounter: Payer: Self-pay | Admitting: Family Medicine

## 2024-06-01 ENCOUNTER — Ambulatory Visit: Admitting: Family Medicine

## 2024-06-01 VITALS — BP 126/58 | HR 65 | Temp 98.1°F | Resp 14 | Ht 60.0 in | Wt 134.6 lb

## 2024-06-01 DIAGNOSIS — R052 Subacute cough: Secondary | ICD-10-CM | POA: Diagnosis not present

## 2024-06-01 DIAGNOSIS — E1165 Type 2 diabetes mellitus with hyperglycemia: Secondary | ICD-10-CM | POA: Diagnosis not present

## 2024-06-01 DIAGNOSIS — R5383 Other fatigue: Secondary | ICD-10-CM | POA: Diagnosis not present

## 2024-06-01 LAB — POCT GLYCOSYLATED HEMOGLOBIN (HGB A1C): Hemoglobin A1C: 6.4 % — AB (ref 4.0–5.6)

## 2024-06-01 LAB — GLUCOSE, POCT (MANUAL RESULT ENTRY): POC Glucose: 81 mg/dL (ref 70–99)

## 2024-06-01 MED ORDER — ARNUITY ELLIPTA 100 MCG/ACT IN AEPB: INHALATION_SPRAY | RESPIRATORY_TRACT | 2 refills | Status: AC

## 2024-06-01 NOTE — Patient Instructions (Signed)
 No change in medications at this time.  Glad to hear the fatigue has improved.  Keep follow-up with nutritionist as planned.  The improved diet and mid-day calories are likely the reason for improved symptoms.  Keep up the good work.  Continue to monitor blood sugar and if you consistently see readings in the 200s, let me know.  Cough should continue to improve, but okay to use the as needed medications for upper airway issues, cough as you have experienced previously.  I did refill the Arnuity Ellipta  if needed.  Follow-up if cough does not resolve in the next few weeks, sooner if worse. 17-month follow-up for routine labs and if stable at that time, we will return to 32-month follow-up intervals.

## 2024-06-01 NOTE — Progress Notes (Signed)
 "  Subjective:  Patient ID: Maria Sweeney, female    DOB: May 05, 1951  Age: 74 y.o. MRN: [id number]  CC:  Chief Complaint  Patient presents with   Follow-up    Recheck episodes of fatigue, sweating. Gotten better since visit.    Diabetes    Some BG has been high. 240 was the highest. This morning it was 129 fasting    HPI Maria Sweeney presents for   Diabetes: With history of hyperglycemia, diet controlled.  well-controlled on most recent labs.  Few episodes of fatigue discussed previously, improved with snacks.  Episodes.  To occur between lunch and dinner.  Question of a possible relative calorie deficit.  We discussed low glycemic index foods, increase portions at lunch and midday snack if needed and refer to nutritionist.  Previous labs for fatigue were reassuring with a normal CBC, sed rate, TSH.  Nutritionist visit November 17 with recommendations provided including physical activity increase. More aware of timing of meals and how much she is eating. No further fatigue/weak spells. Eating in afternoon now.  Home readings. 129 today, up to 243 last night, 112, 108, 126, 128.  microalbumin: Normal ratio 05/17/2023. Optho, foot exam, pneumovax:  Optho in past year - appt in March.  Wt Readings from Last 3 Encounters:  06/01/24 134 lb 9.6 oz (61.1 kg)  04/21/24 133 lb (60.3 kg)  02/28/24 134 lb 8 oz (61 kg)   Lab Results  Component Value Date   HGBA1C 6.4 (A) 06/01/2024   HGBA1C 6.5 02/14/2024   HGBA1C 6.3 08/13/2023   Lab Results  Component Value Date   MICROALBUR 0.8 02/14/2024   LDLCALC 89 02/15/2024   CREATININE 0.70 02/15/2024    Hypertension: Spironolactone  50 mg daily.  Recent cardiology appointment on December 12 with Dr. Gollan.  Evaluated previously for hypertension, palpitations and dyspnea on exertion.  EKG obtained, stable symptoms, no indication of CHF, recommended exercise program.  Continued on spironolactone .  Option of Zio patch, planned on Kardia mobile  device for home monitoring with any concerning EKG strips sent to cardiology.  Unable to add beta-blockers given baseline bradycardia. No new symptoms - has not had to send readings to cardiology.  Home readings: 120-130/70-80 BP Readings from Last 3 Encounters:  06/01/24 (!) 126/58  04/21/24 120/70  02/28/24 (!) 140/70   Lab Results  Component Value Date   CREATININE 0.70 02/15/2024   Hyperlipidemia: Treated with Crestor  10 mg daily, no new side effects. Lab Results  Component Value Date   CHOL 180 02/15/2024   HDL 51.70 02/15/2024   LDLCALC 89 02/15/2024   LDLDIRECT 99.0 09/12/2021   TRIG 195.0 (H) 02/15/2024   CHOLHDL 3 02/15/2024   Lab Results  Component Value Date   ALT 16 02/15/2024   AST 21 02/15/2024   ALKPHOS 76 02/15/2024   BILITOT 0.6 02/15/2024   GERD/upper airway syndrome with chronic cough Treated with Protonix  - has been able to cut back to intermittent dosing- stable. Pepcid  as needed.   Flonase , Arnuity Ellipta  intermittently as needed.  Albuterol  as needed. Viral infection late December, improved, minimal residual cough but improving.  Depression: With insomnia.  Intolerant to mirtazapine  due to side effects.  Minimal change in symptoms with higher dosing of Prozac  above 40 mg.  Only temporary improvement of sleep with Ambien and not sure if melatonin was helpful when discussed at her physical last year.  Additionally hydroxyzine  provided minimal relief and intolerant to side effects.  Minimal change with trazodone .  Continued on 40 mg of Prozac  daily.  Options made with therapist including to discuss other ways to manage sleep difficulties. Has not met with therapist. Sleeping ok overall.      06/01/2024    2:36 PM 02/14/2024   10:23 AM 08/31/2023   11:38 AM 08/31/2023   11:36 AM 08/13/2023   10:30 AM  Depression screen PHQ 2/9  Decreased Interest 0 0 0 2 0  Down, Depressed, Hopeless 0 0 0 0 0  PHQ - 2 Score 0 0 0 2 0  Altered sleeping 1 2 3 3  0  Tired,  decreased energy 1 1 0 0 0  Change in appetite 0 0 0 0 0  Feeling bad or failure about yourself  0 0 0 0 0  Trouble concentrating 0 1 0 0 0  Moving slowly or fidgety/restless 0 0 0 0 0  Suicidal thoughts 0 0  0 0  PHQ-9 Score 2 4  3  5   0   Difficult doing work/chores Not difficult at all Not difficult at all        Data saved with a previous flowsheet row definition    HM: shingrix  recommended at pharmacy. Declines covid. Option of tdap at pharmacy.  History  Patient Active Problem List   Diagnosis Date Noted   Postoperative pain 01/13/2024   Encounter for screening colonoscopy    Vitamin D  deficiency 08/02/2018   Prediabetes 08/02/2018   Carpal tunnel syndrome 02/10/2018   Tinnitus 06/25/2016   Numbness 04/13/2016   Loss of smell 04/13/2016   Pure hypercholesterolemia 11/30/2014   Degenerative disc disease, cervical 04/24/2014   Essential hypertension, benign 10/19/2013   Insomnia 10/19/2013   Herpes labialis 10/19/2013   Anxiety and depression 07/25/2012   GERD (gastroesophageal reflux disease) 07/25/2012   Past Medical History:  Diagnosis Date   Allergy    Anxiety    Arthritis    [employer]reesia 04/12/2020   Asthma    Cataract    [employer]reesia 10/25/2019   Cervical spondylosis    Complication of anesthesia    with colonoscopy woke up twice   Depression    Depression    [employer]reesia 04/12/2020   Diabetes mellitus without complication (HCC)    managed with diet and lifestyle   GERD (gastroesophageal reflux disease)    Glaucoma    [employer]reesia 10/25/2019   Hyperlipidemia    Hypertension    IBS (irritable bowel syndrome)    Constipation with diarrhea.   Oral herpes    Past Surgical History:  Procedure Laterality Date   ABDOMINAL HYSTERECTOMY  1978   DUB; ovaries intact.   APPENDECTOMY  1988   BREAST BIOPSY Bilateral 2009   Calcium  Deposit- 1980's and also in 2009/ Right side 1995   BREAST EXCISIONAL BIOPSY Bilateral    BREAST SURGERY N/A    [employer]reesia 10/25/2019    CARPAL TUNNEL RELEASE Left 01/2024   CATARACT EXTRACTION W/PHACO Left 10/23/2020   Procedure: CATARACT EXTRACTION PHACO AND INTRAOCULAR LENS PLACEMENT (IOC) LEFT TORIC LENS KAHOOK DUAL BLADE GONIOTOMY,  VISION BLUE 7.69 00:53.0;  Surgeon: Mittie Gaskin, MD;  Location: [facility];  Service: Ophthalmology;  Laterality: Left;   CATARACT EXTRACTION W/PHACO Right 11/20/2020   Procedure: CATARACT EXTRACTION PHACO AND INTRAOCULAR LENS PLACEMENT (IOC) RIGHT LENS KAHOOK DUAL BLADE GONIOTOMY VISION BLUE 8.58 01:24.9;  Surgeon: Mittie Gaskin, MD;  Location: [facility];  Service: Ophthalmology;  Laterality: Right;  latex   COLONOSCOPY  05/11/2009   Normal.  Previous colonoscopy with polyps.  [facility].     COLONOSCOPY WITH PROPOFOL  N/A 12/20/2019   Procedure: COLONOSCOPY WITH PROPOFOL ;  Surgeon: Unk Corinn Skiff, MD;  Location[facility];  Service: Gastroenterology;  Laterality: N/A;   DEBRIDEMENT TENNIS ELBOW     EYE SURGERY  2022   Cataracts   ROTATOR CUFF REPAIR     TONSILLECTOMY     UPPER GASTROINTESTINAL ENDOSCOPY     Allergies[1] Prior to Admission medications  Medication Sig Start Date End Date Taking? Authorizing Provider  albuterol  (PROVENTIL  HFA;VENTOLIN  HFA) 108 (90 Base) MCG/ACT inhaler Inhale 2 puffs into the lungs every 4 (four) hours as needed for wheezing or shortness of breath (cough, shortness of breath or wheezing.). 02/11/16  Yes Claudene Rayfield HERO, MD  ARNUITY ELLIPTA  100 MCG/ACT AEPB INHALE ONE PUFF INTO THE LUNGS DAILY AS NEEDED. 11/08/23  Yes Levora Reyes SAUNDERS, MD  dorzolamide-timolol  (COSOPT) 22.3-6.8 MG/ML ophthalmic solution  12/25/19  Yes [provider]  famotidine  (PEPCID ) 40 MG tablet TAKE ONE TABLET (40 MG TOTAL) BY MOUTH DAILY. 02/22/24  Yes Levora Reyes SAUNDERS, MD  FLUoxetine  (PROZAC ) 20 MG capsule Take 2 capsules (40 mg total) by mouth daily. 02/14/24  Yes Levora Reyes SAUNDERS, MD  fluticasone  (FLONASE ) 50 MCG/ACT nasal spray Place 2  sprays into both nostrils daily. 02/14/24  Yes Levora Reyes SAUNDERS, MD  latanoprost (XALATAN) 0.005 % ophthalmic solution 1 drop at bedtime.   Yes [provider]  Omega-3 Fatty Acids (FISH OIL OMEGA-3) 1000 MG CAPS Take by mouth.   Yes [provider]  ondansetron  (ZOFRAN -ODT) 4 MG disintegrating tablet Take 2 mg by mouth every 6 (six) hours as needed. 01/13/24  Yes [provider]  pantoprazole  (PROTONIX ) 40 MG tablet TAKE ONE TABLET BY MOUTH ONE TO TWO TIMES DAILY BEFORE MEALS 10/14/23  Yes Armbruster, Elspeth SQUIBB, MD  rosuvastatin  (CRESTOR ) 10 MG tablet Take 1 tablet (10 mg total) by mouth daily. 08/13/23  Yes Levora Reyes SAUNDERS, MD  spironolactone  (ALDACTONE ) 50 MG tablet Take 1 tablet (50 mg total) by mouth daily. 02/14/24  Yes Levora Reyes SAUNDERS, MD  valACYclovir  (VALTREX ) 1000 MG tablet Take by mouth as needed.   Yes [provider]  VITAMIN D  PO Take by mouth.   Yes [provider]  methylcellulose (CITRUCEL) oral powder Take 1 packet by mouth daily. Patient not taking: Reported on 06/01/2024 03/20/22   Armbruster, Elspeth SQUIBB, MD   Social History   Socioeconomic History   Marital status: Married    Spouse name: Not on file   Number of children: 2   Years of education: Bachelors   Highest education level: Bachelor's degree (e.g., BA, AB, BS)  Occupational History   Occupation: Training And Development Officer: [employer]    Comment: also works at [employer].   Occupation: retired  Tobacco Use   Smoking status: Never   Smokeless tobacco: Never  Vaping Use   Vaping status: Never Used  Substance and Sexual Activity   Alcohol use: Never   Drug use: Never   Sexual activity: Yes    Partners: Male    Birth control/protection: Surgical, Post-menopausal  Other Topics Concern   Not on file  Social History Narrative   Marital status: divorced since 1999; dating seriously in 2019 since 2015.      Children: one son, one daughter; 2 grandchildren.      Lives: alone  in house with 2 dogs     Employment:  Retired.  Needs to work in 2019.  Tobacco: none      Alcohol: rare      Drugs: none      Exercise:  Sporadic   Right-handed.   Occasional caffeine use.      ADLs: independent with ADLs; drives; no assistant devices.      Advanced Directives: none; FULL CODE.   Social Drivers of Health   Tobacco Use: Low Risk (06/01/2024)   Patient History    Smoking Tobacco Use: Never    Smokeless Tobacco Use: Never    Passive Exposure: Not on file  Financial Resource Strain: Low Risk (02/14/2024)   Overall Financial Resource Strain (CARDIA)    Difficulty of Paying Living Expenses: Not hard at all  Food Insecurity: No Food Insecurity (02/14/2024)   Epic    Worried About Programme Researcher, Broadcasting/film/video in the Last Year: Never true    Ran Out of Food in the Last Year: Never true  Transportation Needs: No Transportation Needs (02/14/2024)   Epic    Lack of Transportation (Medical): No    Lack of Transportation (Non-Medical): No  Physical Activity: Insufficiently Active (02/14/2024)   Exercise Vital Sign    Days of Exercise per Week: 2 days    Minutes of Exercise per Session: 60 min  Stress: No Stress Concern Present (02/14/2024)   [employer] of Occupational Health - Occupational Stress Questionnaire    Feeling of Stress: Not at all  Social Connections: Moderately Integrated (02/14/2024)   Social Connection and Isolation Panel    Frequency of Communication with Friends and Family: More than three times a week    Frequency of Social Gatherings with Friends and Family: Three times a week    Attends Religious Services: More than 4 times per year    Active Member of Clubs or Organizations: No    Attends Banker Meetings: Not on file    Marital Status: Married  Intimate Partner Violence: Not At Risk (08/31/2023)   Humiliation, Afraid, Rape, and Kick questionnaire    Fear of Current or Ex-Partner: No    Emotionally Abused: No    Physically Abused: No     Sexually Abused: No  Depression (PHQ2-9): Low Risk (06/01/2024)   Depression (PHQ2-9)    PHQ-2 Score: 2  Alcohol Screen: Low Risk (08/31/2023)   Alcohol Screen    Last Alcohol Screening Score (AUDIT): 0  Housing: Low Risk (02/14/2024)   Epic    Unable to Pay for Housing in the Last Year: No    Number of Times Moved in the Last Year: 0    Homeless in the Last Year: No  Utilities: Not At Risk (08/31/2023)   AHC Utilities    Threatened with loss of utilities: No  Health Literacy: Adequate Health Literacy (08/31/2023)   B1300 Health Literacy    Frequency of need for help with medical instructions: Never    Review of Systems  Constitutional:  Negative for fatigue and unexpected weight change.  Respiratory:  Negative for chest tightness and shortness of breath.   Cardiovascular:  Negative for chest pain, palpitations and leg swelling.  Gastrointestinal:  Negative for abdominal pain and blood in stool.  Neurological:  Negative for dizziness, syncope, light-headedness and headaches.     Objective:   Vitals:   06/01/24 1348  BP: (!) 126/58  Pulse: 65  Resp: 14  Temp: 98.1 F (36.7 C)  TempSrc: Temporal  SpO2: 97%  Weight: 134 lb 9.6 oz (61.1 kg)  Height: 5' (1.524 m)  Physical Exam Vitals reviewed.  Constitutional:      Appearance: Normal appearance. She is well-developed.  HENT:     Head: Normocephalic and atraumatic.  Eyes:     Conjunctiva/sclera: Conjunctivae normal.     Pupils: Pupils are equal, round, and reactive to light.  Neck:     Vascular: No carotid bruit.  Cardiovascular:     Rate and Rhythm: Normal rate and regular rhythm.     Heart sounds: Normal heart sounds.  Pulmonary:     Effort: Pulmonary effort is normal.     Breath sounds: Normal breath sounds.  Abdominal:     Palpations: Abdomen is soft. There is no pulsatile mass.     Tenderness: There is no abdominal tenderness.  Musculoskeletal:     Right lower leg: No edema.     Left lower leg: No  edema.  Skin:    General: Skin is warm and dry.  Neurological:     Mental Status: She is alert and oriented to person, place, and time.  Psychiatric:        Mood and Affect: Mood normal.        Behavior: Behavior normal.       Results for orders placed or performed in visit on 06/01/24  POCT glucose (manual entry)   Collection Time: 06/01/24  2:36 PM  Result Value Ref Range   POC Glucose 81 70 - 99 mg/dl  POCT glycosylated hemoglobin (Hb A1C)   Collection Time: 06/01/24  2:39 PM  Result Value Ref Range   Hemoglobin A1C 6.4 (A) 4.0 - 5.6 %   HbA1c POC (<> result, manual entry)     HbA1c, POC (prediabetic range)     HbA1c, POC (controlled diabetic range)      Assessment & Plan:  Maria Sweeney is a 74 y.o. female . Type 2 diabetes mellitus with hyperglycemia, without long-term current use of insulin (HCC) - Plan: POCT glucose (manual entry), POCT glycosylated hemoglobin (Hb A1C)  - Rare elevated reading, but based on A1c and in office glucose will continue same approach, no new meds for now.  RTC precautions if frequent elevated readings.  Meeting with nutritionist and adjusting of diet with mid day snacks has been helpful without recurrence of fatigue episodes.  Continue to monitor.  22-month recheck.  Subacute cough - Plan: Fluticasone  Furoate (ARNUITY ELLIPTA ) 100 MCG/ACT AEPB  - GERD with upper airway syndrome.  Viral infection late December, improved.  Continue to monitor, Arnuity elliptica if needed.  Albuterol  as needed.  RTC precautions if not continuing to improve.  Fatigue, unspecified type As above, symptoms have improved with dietary changes.  RTC precautions if recurrent  Meds ordered this encounter  Medications   Fluticasone  Furoate (ARNUITY ELLIPTA ) 100 MCG/ACT AEPB    Sig: INHALE ONE PUFF INTO THE LUNGS DAILY AS NEEDED.    Dispense:  30 each    Refill:  2   Patient Instructions  No change in medications at this time.  Glad to hear the fatigue has improved.   Keep follow-up with nutritionist as planned.  The improved diet and mid-day calories are likely the reason for improved symptoms.  Keep up the good work.  Continue to monitor blood sugar and if you consistently see readings in the 200s, let me know.  Cough should continue to improve, but okay to use the as needed medications for upper airway issues, cough as you have experienced previously.  I did refill the Arnuity Ellipta  if needed.  Follow-up if cough does not resolve in the next few weeks, sooner if worse. 1-month follow-up for routine labs and if stable at that time, we will return to 56-month follow-up intervals.    Signed,   Reyes Pines, MD [facility], [facility] 06/01/24 2:41 PM       [1]  Allergies Allergen Reactions   Amoxicillin Anaphylaxis   Valproic Acid Other (See Comments)   Augmentin [Amoxicillin-Pot Clavulanate]     Numbness around mouth, throat tightness   Clavulanic Acid    Codeine Itching   Depakote [Divalproex Sodium]     hives   Latex Hives   Lipitor [Atorvastatin]     Muscles pain   Penicillins     Throat tightness, mouth numb   Sulfamethoxazole    Trimethoprim    Zocor [Simvastatin]     Muscle pain   Septra [Sulfamethoxazole-Trimethoprim] Rash   "

## 2024-06-03 ENCOUNTER — Ambulatory Visit: Payer: Self-pay | Admitting: Family Medicine

## 2024-06-05 NOTE — Progress Notes (Signed)
 Maria Sweeney                                          MRN: [id number]   06/05/2024   The [facility] Quality Team Specialist reviewed this patient medical record for the purposes of chart review for care gap closure. The following were reviewed: chart review for care gap closure-controlling blood pressure.    [facility] Quality Team

## 2024-06-09 ENCOUNTER — Other Ambulatory Visit: Payer: Self-pay | Admitting: Gastroenterology

## 2024-06-09 DIAGNOSIS — K219 Gastro-esophageal reflux disease without esophagitis: Secondary | ICD-10-CM

## 2024-06-13 NOTE — Progress Notes (Unsigned)
" °  11/17,   Patient is here today with her husband Maria Sweeney. Patient would like to learn how to meal plan and weight management. Patient lives with with her husband.  Pt reports shared shopping and Pt does the cooking. Pt reports eating out one time weekly. Pt reports making the following changes including cutting out sugar by limiting sweet and no adding sugar. Pt reports she enjoys walking and has a photographer. Pt reports physical activity is a challenge stating lack of motivation.    Pt reports typical intake of two meals with 2 snacks. Pt reports she is lactose intolerant and can accept cheese and yogurt.  Pt reports this morning 137 mg/dL.  All Pt's questions were answered during this encounter.   "

## 2024-06-20 ENCOUNTER — Encounter: Admitting: Dietician

## 2024-09-01 ENCOUNTER — Ambulatory Visit: Admitting: Family Medicine

## 2024-09-12 ENCOUNTER — Encounter
